# Patient Record
Sex: Male | Born: 1965 | Race: White | Hispanic: No | Marital: Single | Smoking: Former smoker
Health system: Southern US, Community
[De-identification: ages and names within clinical notes are randomized; demographics above are authoritative.]

## PROBLEM LIST (undated history)

## (undated) DIAGNOSIS — E119 Type 2 diabetes mellitus without complications: Secondary | ICD-10-CM

## (undated) DIAGNOSIS — E785 Hyperlipidemia, unspecified: Secondary | ICD-10-CM

## (undated) DIAGNOSIS — I10 Essential (primary) hypertension: Secondary | ICD-10-CM

## (undated) DIAGNOSIS — E669 Obesity, unspecified: Secondary | ICD-10-CM

## (undated) HISTORY — DX: Type 2 diabetes mellitus without complications: E11.9

## (undated) HISTORY — PX: VASECTOMY: SHX75

## (undated) HISTORY — PX: HAND SURGERY: SHX662

## (undated) HISTORY — DX: Essential (primary) hypertension: I10

---

## 2003-06-08 ENCOUNTER — Emergency Department (HOSPITAL_COMMUNITY): Admission: EM | Admit: 2003-06-08 | Discharge: 2003-06-08 | Payer: Self-pay | Admitting: Emergency Medicine

## 2003-06-08 IMAGING — CT CT HEAD W/O CM
1 series · 16 of 28 positions shown, 20 images · non-contrast
Comparison: none

CLINICAL DATA: Syncope.
 HEAD CT WITHOUT CONTRAST
 5 mm scans are made through the whole head.  The brain has a normal appearance without evidence of atrophy, stroke, mass, hemorrhage, hydrocephalus, or extraaxial collection.  The calvarium is unremarkable.   The paranasal sinuses, middle ears, and mastoids are clear. 
 IMPRESSION
 Normal head CT.

[Series 9173: — · axial · 0.46mm/px · z∈[-644,-519]mm · 16 of 28 slices shown, 20 images]
[im 2/28  brain]
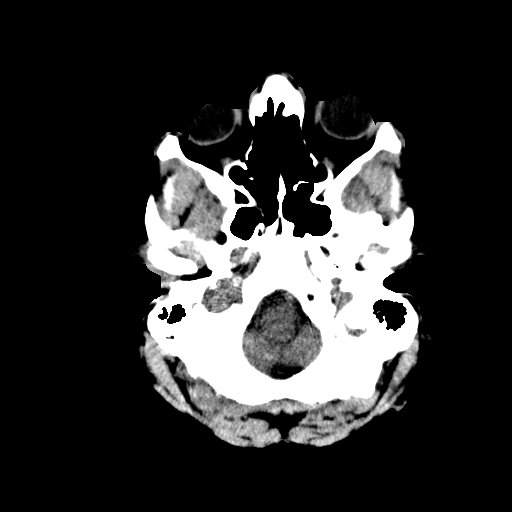
[im 2/28  bone]
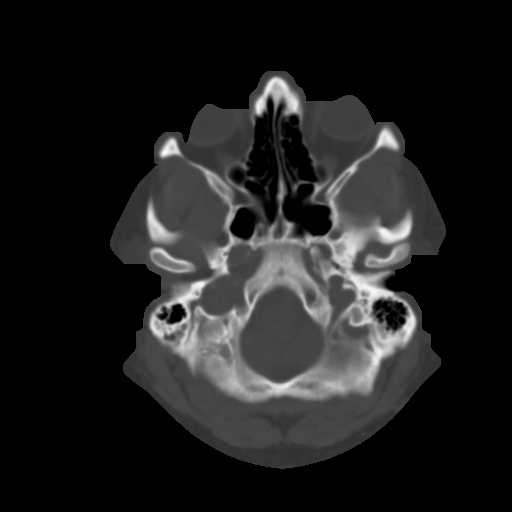
[im 4/28  brain]
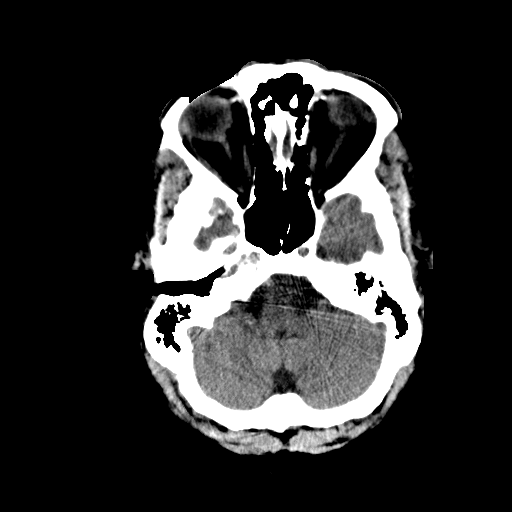
[im 6/28  brain]
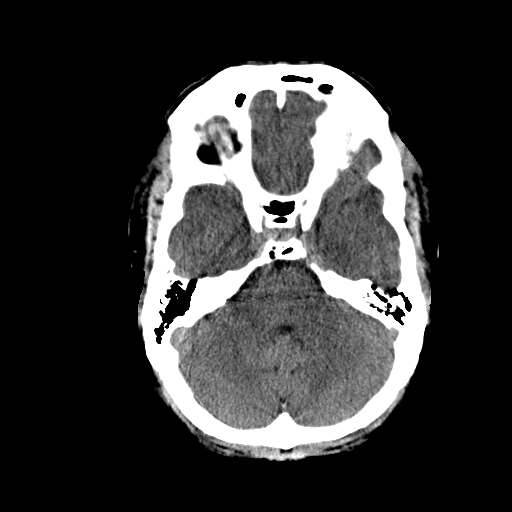
[im 7/28  brain]
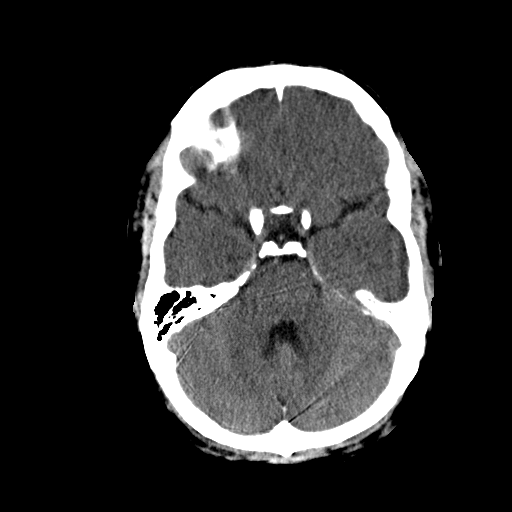
[im 9/28  brain]
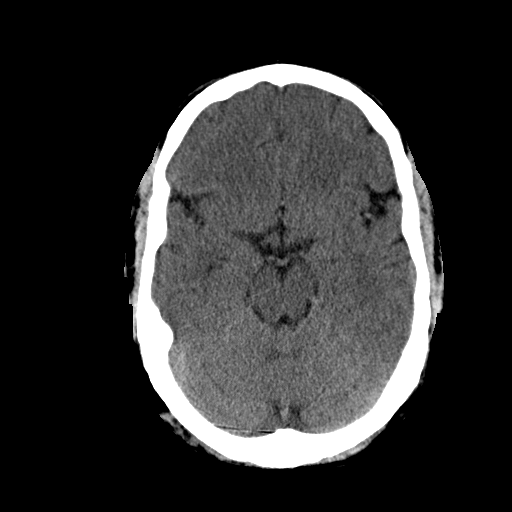
[im 9/28  bone]
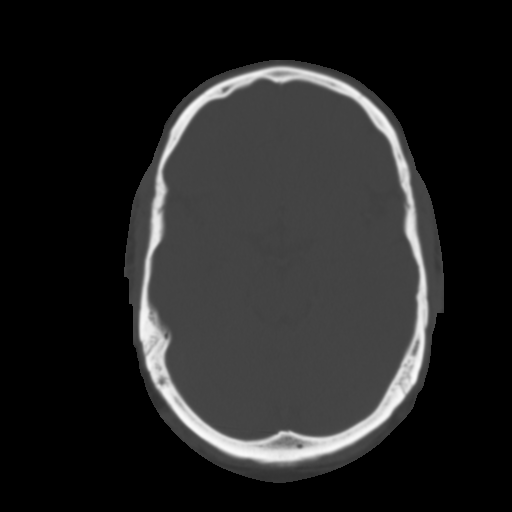
[im 10/28  brain]
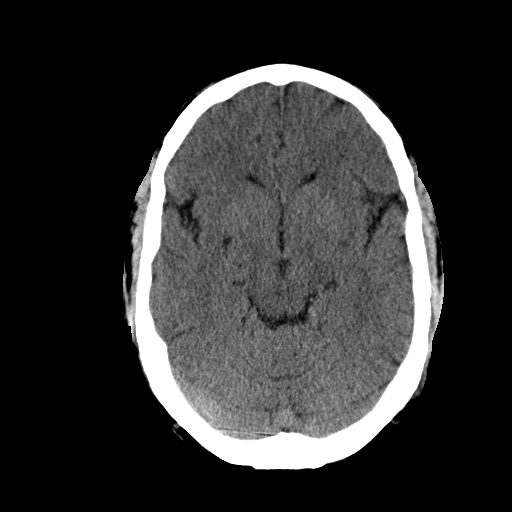
[im 12/28  brain]
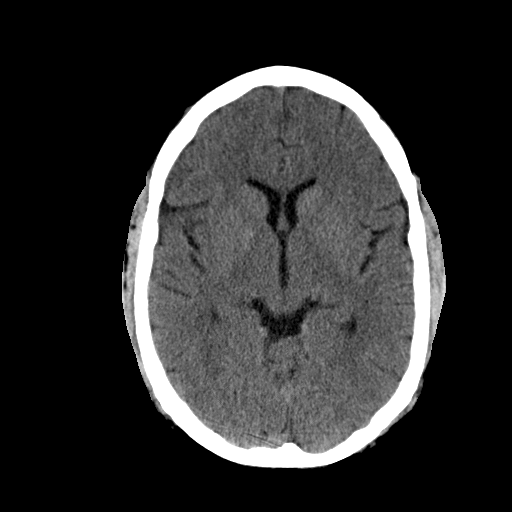
[im 14/28  brain]
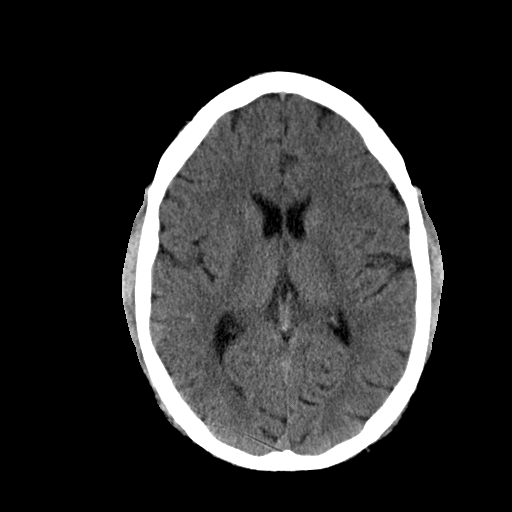
[im 15/28  brain]
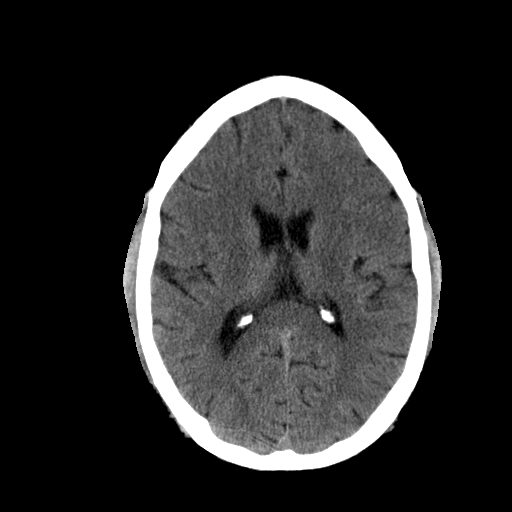
[im 15/28  bone]
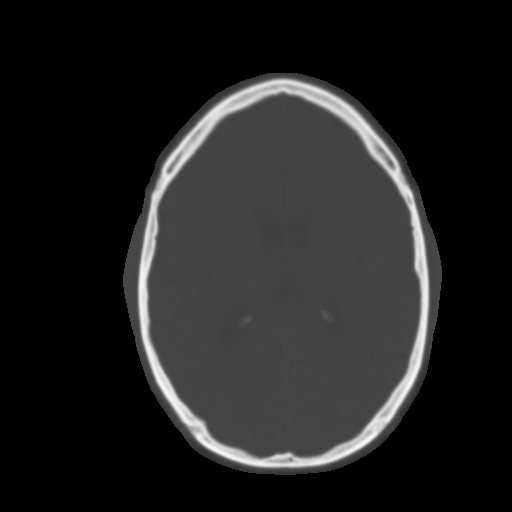
[im 17/28  brain]
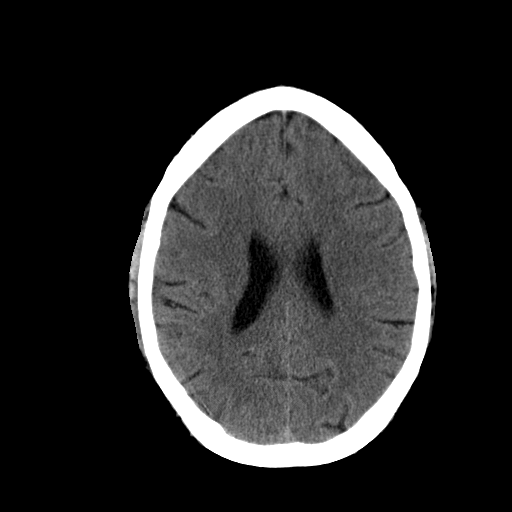
[im 19/28  brain]
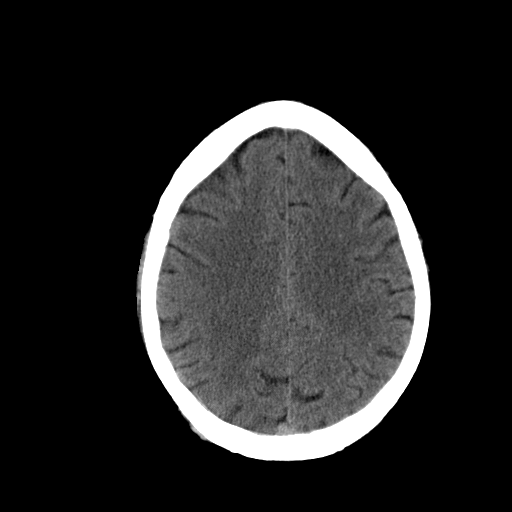
[im 20/28  brain]
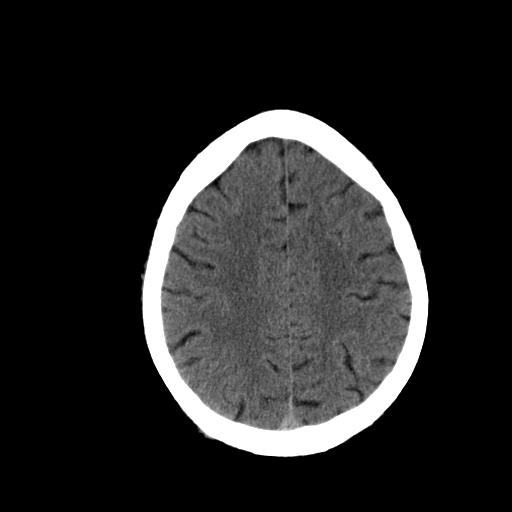
[im 22/28  brain]
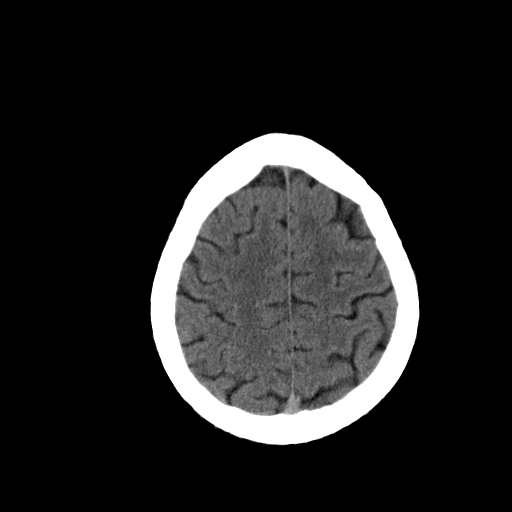
[im 22/28  bone]
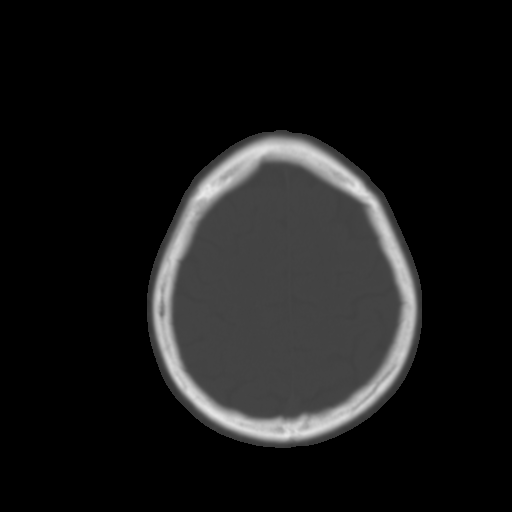
[im 23/28  brain]
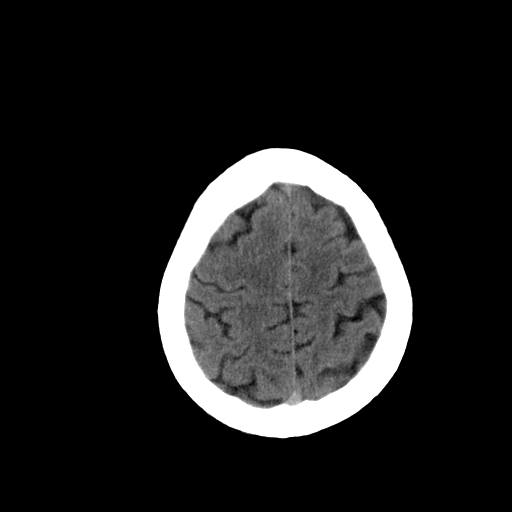
[im 25/28  brain]
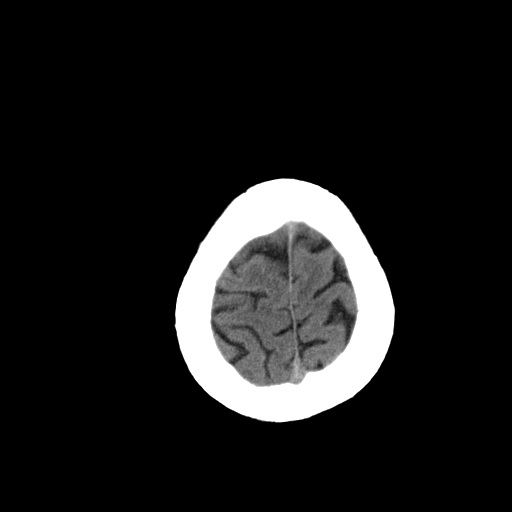
[im 27/28  brain]
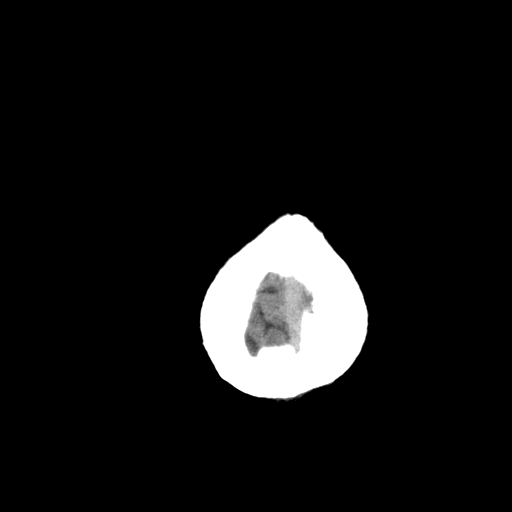

[16 of 28 positions shown; findings below may reference images not displayed]

## 2005-01-28 ENCOUNTER — Emergency Department (HOSPITAL_COMMUNITY): Admission: EM | Admit: 2005-01-28 | Discharge: 2005-01-29 | Payer: Self-pay | Admitting: Emergency Medicine

## 2005-01-30 ENCOUNTER — Inpatient Hospital Stay (HOSPITAL_COMMUNITY): Admission: EM | Admit: 2005-01-30 | Discharge: 2005-02-01 | Payer: Self-pay | Admitting: Emergency Medicine

## 2005-01-30 IMAGING — CR DG HAND COMPLETE 3+V*R*
3 series · 3 of 3 positions shown · non-contrast
Comparison: None.

CLINICAL DATA: Post I&D of hand wound ? now pain and swelling in hand.    
 RIGHT HAND - 3 VIEW:

[view not recorded (1 of 3)]
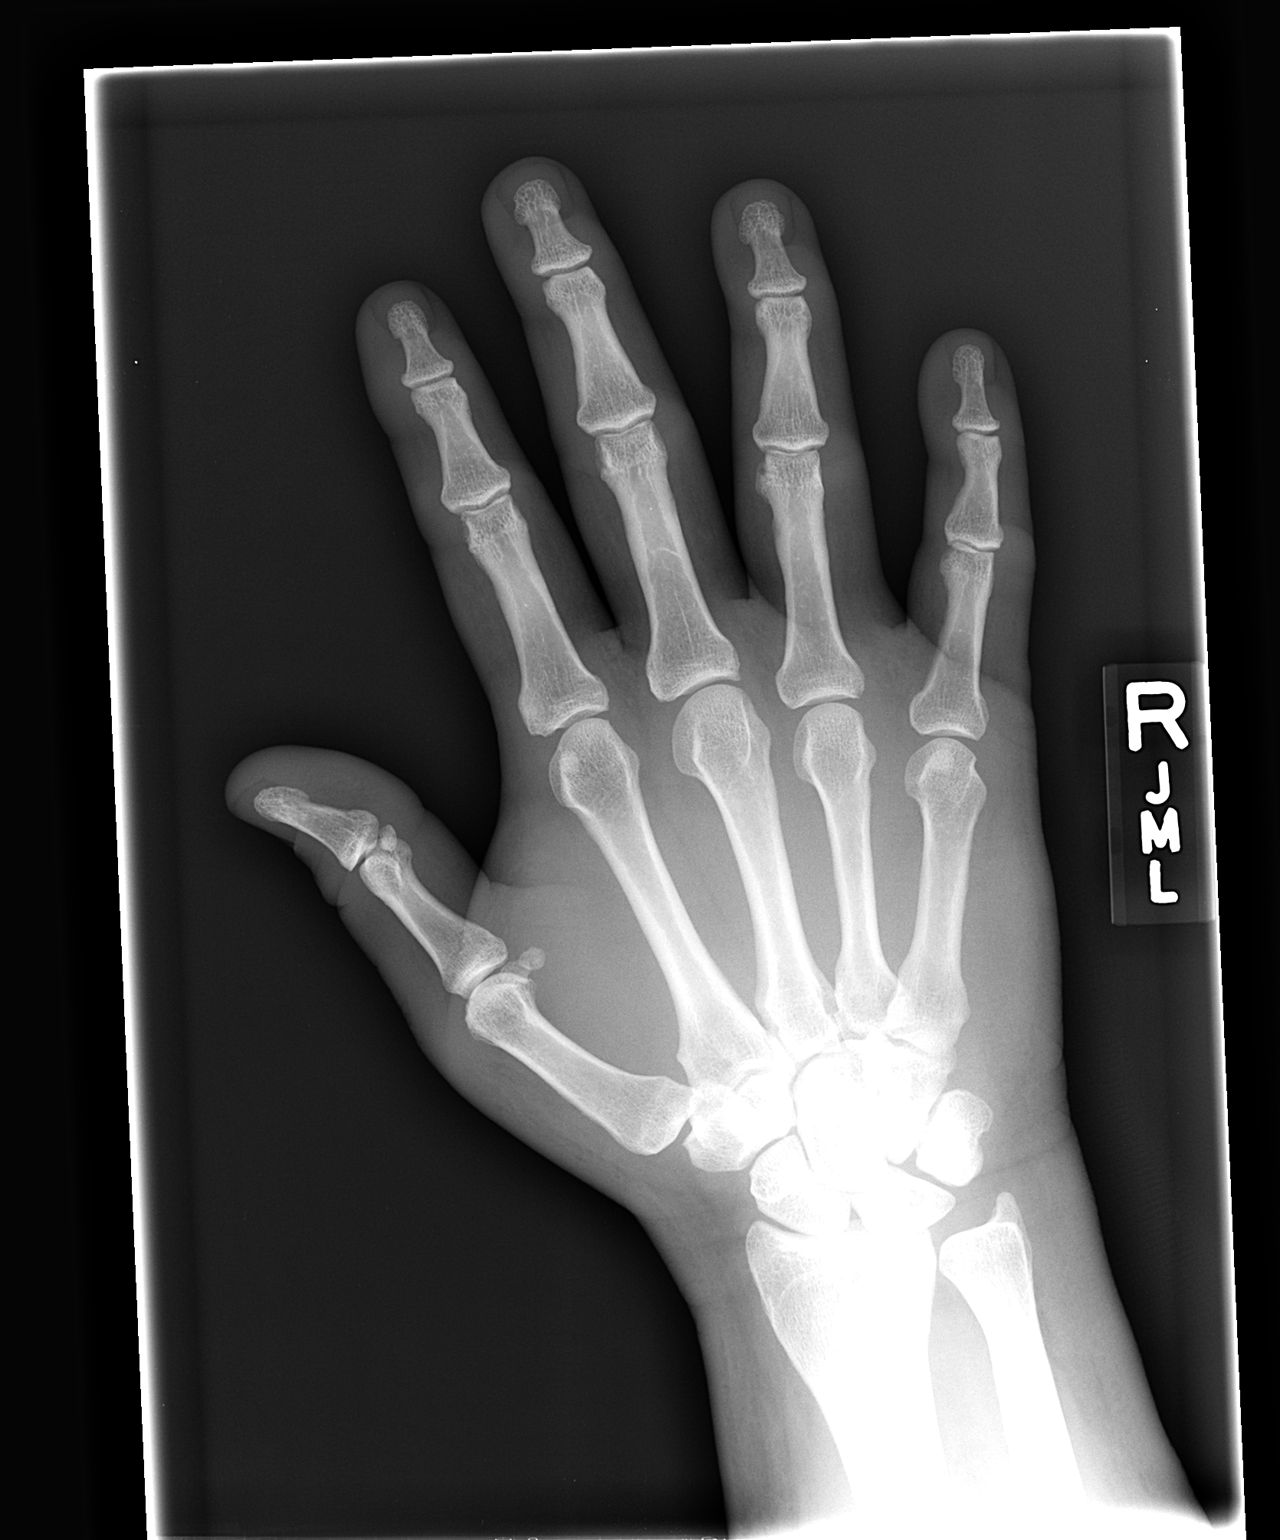

[view not recorded (2 of 3)]
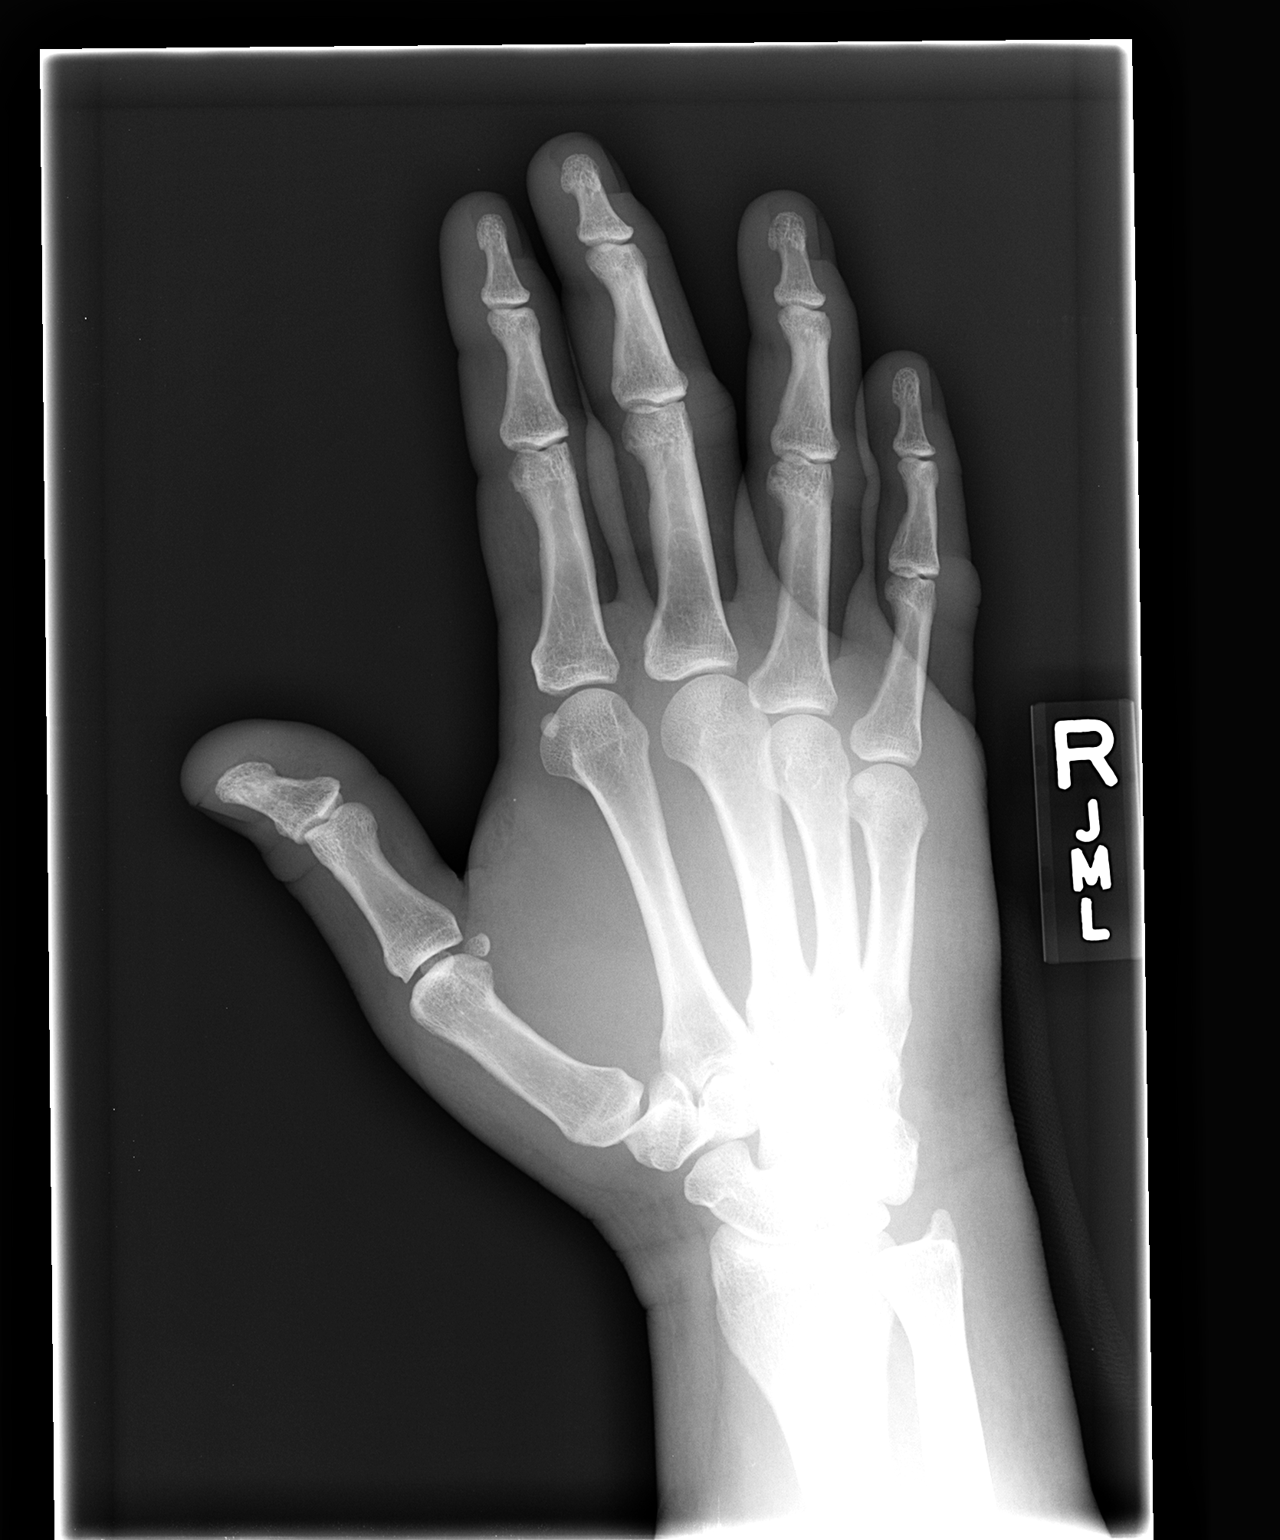

[view not recorded (3 of 3)]
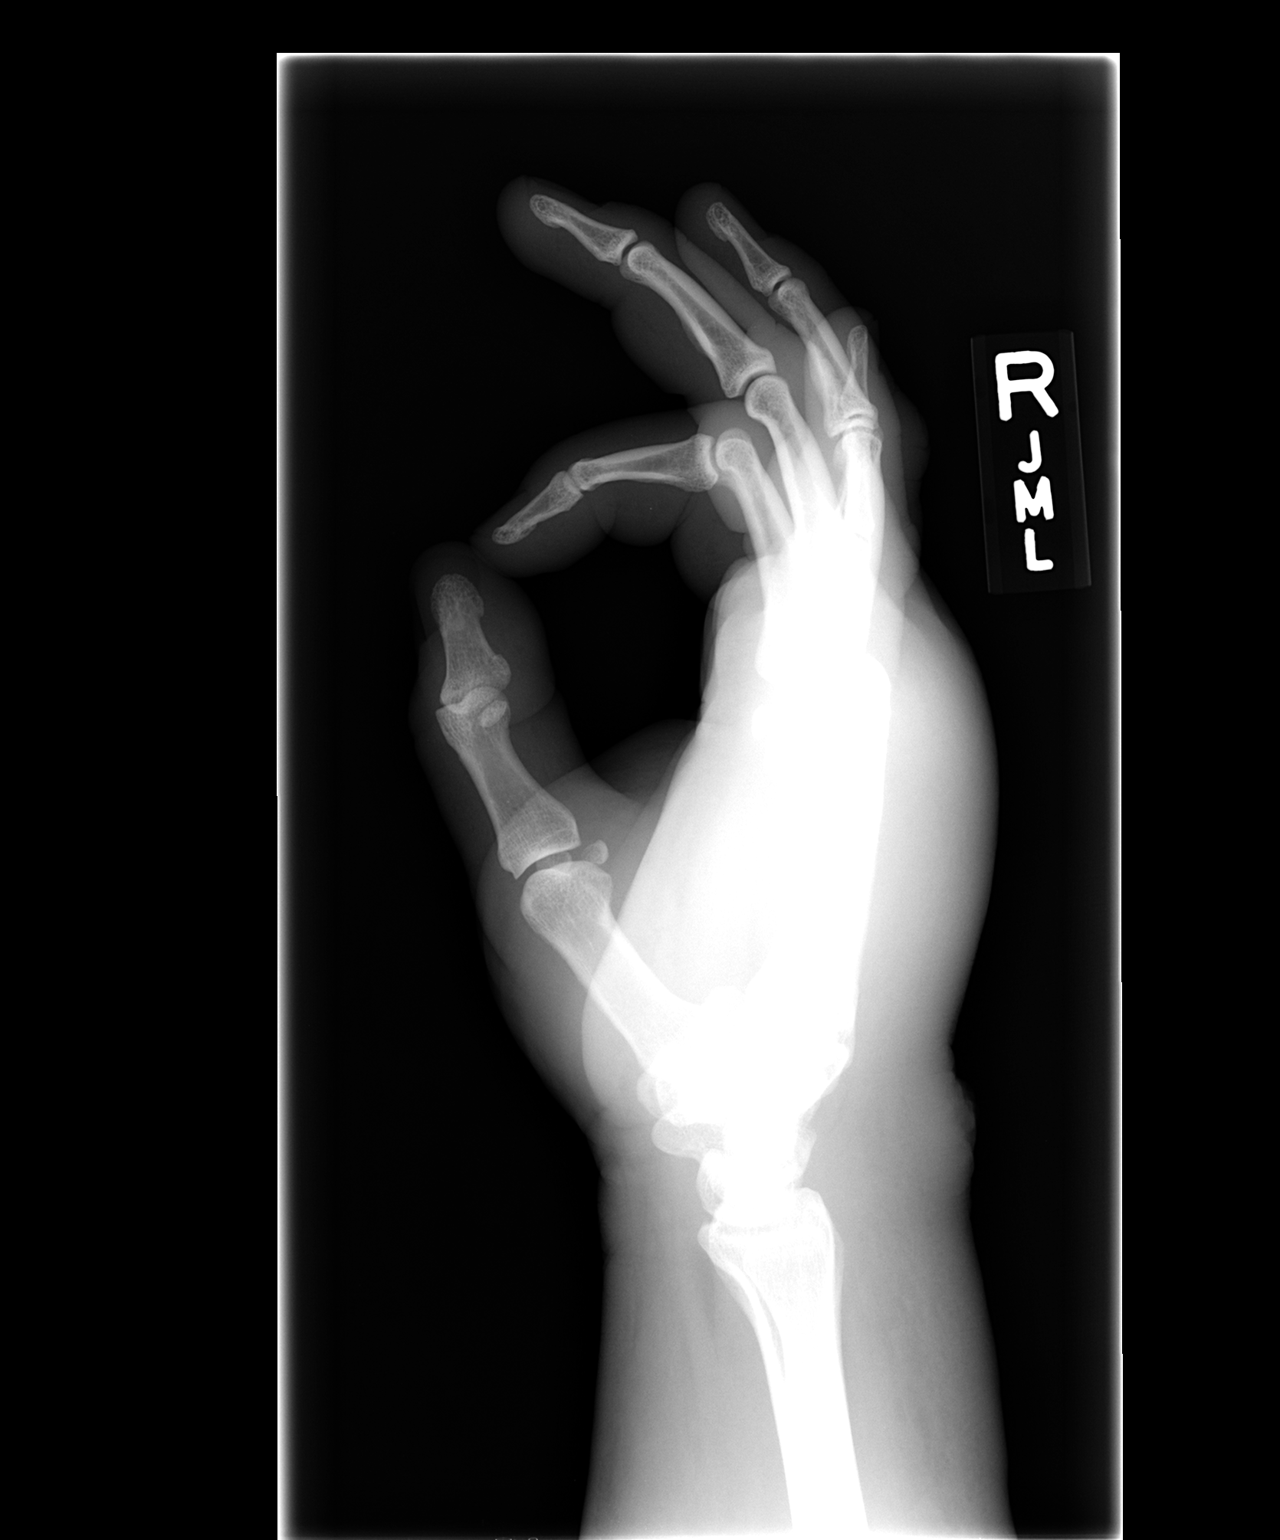

[3 of 3 positions shown; findings below may reference images not displayed]

FINDINGS: Marked soft tissue swelling without gas or foreign body in the soft tissues.  Bony structures normal.
IMPRESSION: Soft tissue swelling - otherwise normal.

## 2005-02-01 IMAGING — CR DG CHEST 1V PORT
1 series · 1 of 1 positions shown · non-contrast
Comparison: none

CLINICAL DATA: cellulitis abscess of the hand; PICC line placement
 PORTABLE CHEST- 1 VIEW:

[view not recorded]
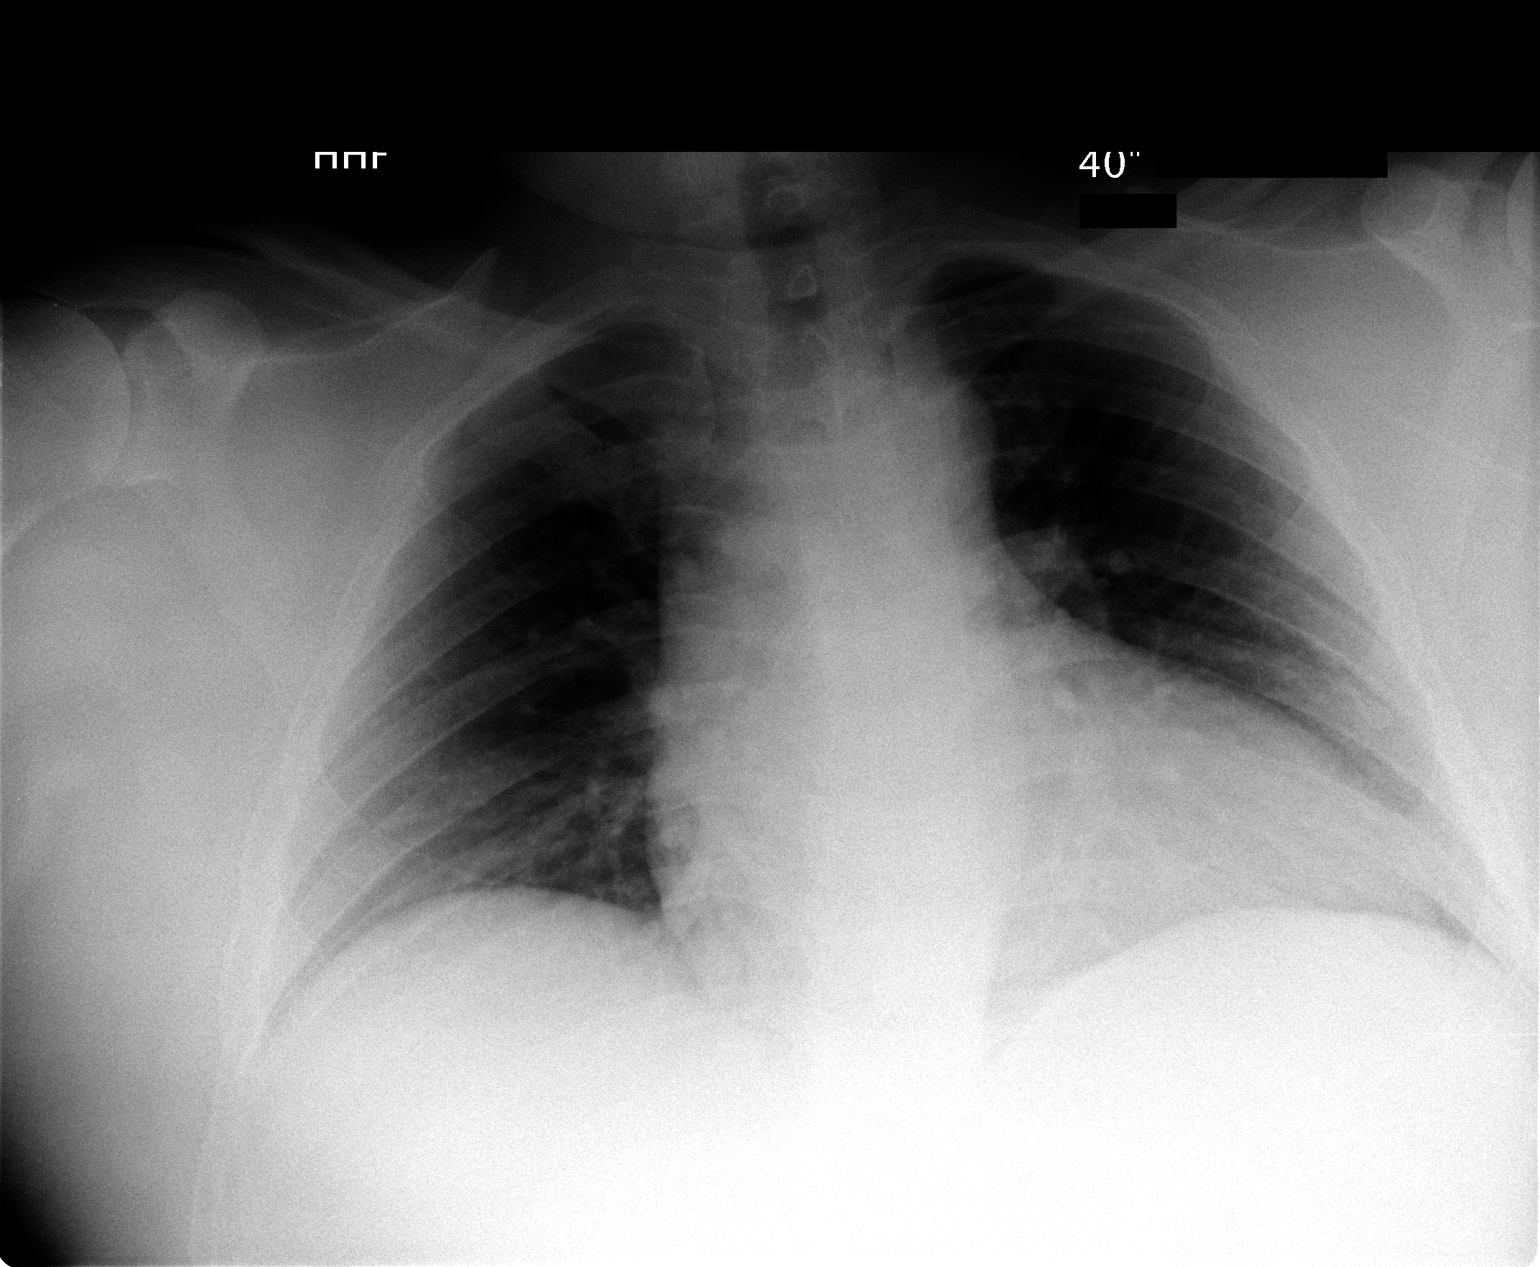

[1 of 1 positions shown; findings below may reference images not displayed]

FINDINGS: An AP semierect portable film of the chest without previous films for comparison shows a PICC line to have been inserted from the left arm.  The tip of that line lies in the superior vena cava and estimated 7cm above the right atrium.  There is no pneumothorax.  There is some hypoaeration of both bases.  The heart is within the normal limit considering the patient?s body habitus.
IMPRESSION: PICC line tip in superior vena cava estimated 7cm above the right atrium.  No pneumothorax.

## 2011-02-19 ENCOUNTER — Ambulatory Visit (INDEPENDENT_AMBULATORY_CARE_PROVIDER_SITE_OTHER): Payer: 59

## 2011-02-19 DIAGNOSIS — R509 Fever, unspecified: Secondary | ICD-10-CM

## 2011-02-19 DIAGNOSIS — R05 Cough: Secondary | ICD-10-CM

## 2011-03-17 ENCOUNTER — Ambulatory Visit (INDEPENDENT_AMBULATORY_CARE_PROVIDER_SITE_OTHER): Payer: 59

## 2011-03-17 DIAGNOSIS — J189 Pneumonia, unspecified organism: Secondary | ICD-10-CM

## 2011-03-17 DIAGNOSIS — R509 Fever, unspecified: Secondary | ICD-10-CM

## 2011-06-11 ENCOUNTER — Ambulatory Visit (INDEPENDENT_AMBULATORY_CARE_PROVIDER_SITE_OTHER): Payer: 59 | Admitting: Internal Medicine

## 2011-06-11 VITALS — BP 138/84 | HR 71 | Temp 97.8°F | Resp 18 | Ht 70.0 in | Wt 304.0 lb

## 2011-06-11 DIAGNOSIS — R110 Nausea: Secondary | ICD-10-CM

## 2011-06-11 DIAGNOSIS — R197 Diarrhea, unspecified: Secondary | ICD-10-CM

## 2011-06-11 MED ORDER — BELLADONNA ALK-PHENOBARBITAL 48 MG PO TBCR: 1.0000 | EXTENDED_RELEASE_TABLET | Freq: Four times a day (QID) | ORAL | Status: DC | PRN

## 2011-06-11 NOTE — Progress Notes (Signed)
  Subjective:    Patient ID: Carl [name], male    DOB: 1966-02-09, 46 y.o.   MRN: [id number]  HPI    Review of Systems     Objective:   Physical Exam        Assessment & Plan:

## 2011-06-12 ENCOUNTER — Encounter: Payer: Self-pay | Admitting: Internal Medicine

## 2011-06-12 DIAGNOSIS — E785 Hyperlipidemia, unspecified: Secondary | ICD-10-CM | POA: Insufficient documentation

## 2011-06-12 DIAGNOSIS — E119 Type 2 diabetes mellitus without complications: Secondary | ICD-10-CM | POA: Insufficient documentation

## 2011-06-12 DIAGNOSIS — Z6841 Body Mass Index (BMI) 40.0 and over, adult: Secondary | ICD-10-CM | POA: Insufficient documentation

## 2011-06-12 DIAGNOSIS — I10 Essential (primary) hypertension: Secondary | ICD-10-CM | POA: Insufficient documentation

## 2011-06-12 NOTE — Progress Notes (Signed)
  Subjective:    Patient ID: Carl [name], male    DOB: [birth year], 46 y.o.   MRN: [id number]  HPIPresents with one to 3 episodes of diarrhea a day for the past 48-hour period. He has also had nausea but no vomiting. There is no fever and no blood in the diarrhea. He has mild cramping before the diarrhea. He has missed work Thursday due to feeling bad. He has a general history of reactive loose stools after eating or when upset for many years. His diabetes is stable    Review of Systems     Objective:   Physical Exam  Vital signs stable except for a BMI of 44 Abdomen is soft nontender nondistended with no increased bowel sounds and no organomegaly      Assessment & Plan:  Problem #1 nausea and diarrhea probably viral in origin  Other problems as listed Plan  Probiotics Donatal a.c. And at bedtime for 5 days Followup if not well

## 2011-10-06 ENCOUNTER — Ambulatory Visit (INDEPENDENT_AMBULATORY_CARE_PROVIDER_SITE_OTHER): Payer: 59 | Admitting: Family Medicine

## 2011-10-06 VITALS — BP 138/74 | HR 72 | Temp 98.3°F | Resp 17 | Ht 69.0 in | Wt 309.0 lb

## 2011-10-06 DIAGNOSIS — R350 Frequency of micturition: Secondary | ICD-10-CM

## 2011-10-06 LAB — POCT URINALYSIS DIPSTICK
Bilirubin, UA: NEGATIVE
Glucose, UA: NEGATIVE
Ketones, UA: NEGATIVE
Leukocytes, UA: NEGATIVE
Nitrite, UA: NEGATIVE
Spec Grav, UA: >=1.03
Urobilinogen, UA: 0.2
pH, UA: 5.5

## 2011-10-06 LAB — POCT UA - MICROSCOPIC ONLY
Casts, Ur, LPF, POC: NEGATIVE
Crystals, Ur, HPF, POC: NEGATIVE
Yeast, UA: NEGATIVE

## 2011-10-06 MED ORDER — TAMSULOSIN HCL 0.4 MG PO CAPS: 0.4000 mg | ORAL_CAPSULE | Freq: Every day | ORAL | Status: DC

## 2011-10-06 MED ORDER — CIPROFLOXACIN HCL 500 MG PO TABS: 500.0000 mg | ORAL_TABLET | Freq: Two times a day (BID) | ORAL | Status: AC

## 2011-10-06 NOTE — Progress Notes (Signed)
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    His PMHx is positive for BPH  He tried taking a beer, otherwise no rx attempted.  Objective:  NAD, morbidly obese, plethoric Abdomen:  Nontender. Results for orders placed in visit on 10/06/11  POCT UA - MICROSCOPIC ONLY      Component Value Range   WBC, Ur, HPF, POC 5-8     RBC, urine, microscopic 2-4     Bacteria, U Microscopic trace     Mucus, UA small     Epithelial cells, urine per micros 2-3     Crystals, Ur, HPF, POC neg     Casts, Ur, LPF, POC neg     Yeast, UA neg    POCT URINALYSIS DIPSTICK      Component Value Range   Color, UA yellow     Clarity, UA hazy     Glucose, UA neg     Bilirubin, UA neg     Ketones, UA neg     Spec Grav, UA >=1.030     Blood, UA trace     pH, UA 5.5     Protein, UA trace     Urobilinogen, UA 0.2     Nitrite, UA neg     Leukocytes, UA Negative       Assessment:  Suspect UTI, but this could also be overflow incontinence secondary to BPH  Plan: Urine culture 1. Urinary frequency  POCT UA - Microscopic Only, POCT urinalysis dipstick, Urine culture, Tamsulosin HCl (FLOMAX) 0.4 MG CAPS, ciprofloxacin (CIPRO) 500 MG tablet

## 2011-10-08 LAB — URINE CULTURE
Colony Count: NO GROWTH
Organism ID, Bacteria: NO GROWTH

## 2011-11-24 ENCOUNTER — Ambulatory Visit: Payer: 59

## 2011-11-24 ENCOUNTER — Ambulatory Visit (INDEPENDENT_AMBULATORY_CARE_PROVIDER_SITE_OTHER): Payer: 59 | Admitting: Internal Medicine

## 2011-11-24 VITALS — BP 130/78 | HR 84 | Temp 98.3°F | Resp 22 | Ht 69.5 in | Wt 308.2 lb

## 2011-11-24 DIAGNOSIS — S40019A Contusion of unspecified shoulder, initial encounter: Secondary | ICD-10-CM

## 2011-11-24 DIAGNOSIS — E119 Type 2 diabetes mellitus without complications: Secondary | ICD-10-CM

## 2011-11-24 DIAGNOSIS — M25519 Pain in unspecified shoulder: Secondary | ICD-10-CM

## 2011-11-24 DIAGNOSIS — S40029A Contusion of unspecified upper arm, initial encounter: Secondary | ICD-10-CM

## 2011-11-24 DIAGNOSIS — Z5181 Encounter for therapeutic drug level monitoring: Secondary | ICD-10-CM

## 2011-11-24 DIAGNOSIS — I10 Essential (primary) hypertension: Secondary | ICD-10-CM

## 2011-11-24 DIAGNOSIS — M25511 Pain in right shoulder: Secondary | ICD-10-CM

## 2011-11-24 LAB — POCT CBC
Granulocyte percent: 68.8 %G (ref 37–80)
HCT, POC: 47.5 % (ref 43.5–53.7)
Hemoglobin: 15.3 g/dL (ref 14.1–18.1)
Lymph, poc: 2.4 (ref 0.6–3.4)
MCH, POC: 28.9 pg (ref 27–31.2)
MCHC: 32.2 g/dL (ref 31.8–35.4)
MCV: 89.7 fL (ref 80–97)
MID (cbc): 0.6 (ref 0–0.9)
MPV: 10.8 fL (ref 0–99.8)
POC Granulocyte: 6.6 (ref 2–6.9)
POC LYMPH PERCENT: 25 %L (ref 10–50)
POC MID %: 6.2 %M (ref 0–12)
Platelet Count, POC: 261 10*3/uL (ref 142–424)
RBC: 5.3 M/uL (ref 4.69–6.13)
RDW, POC: 14.9 %
WBC: 9.6 10*3/uL (ref 4.6–10.2)

## 2011-11-24 LAB — POCT GLYCOSYLATED HEMOGLOBIN (HGB A1C): Hemoglobin A1C: 8.3

## 2011-11-24 LAB — GLUCOSE, POCT (MANUAL RESULT ENTRY): POC Glucose: 150 mg/dl — AB (ref 70–99)

## 2011-11-24 IMAGING — CR DG SHOULDER 2+V*R*
3 series · 3 of 3 positions shown · non-contrast
Comparison: None.

CLINICAL DATA: Fell from a ladder and injured the right shoulder.

RIGHT SHOULDER - 2+ VIEW

[ap int rot]
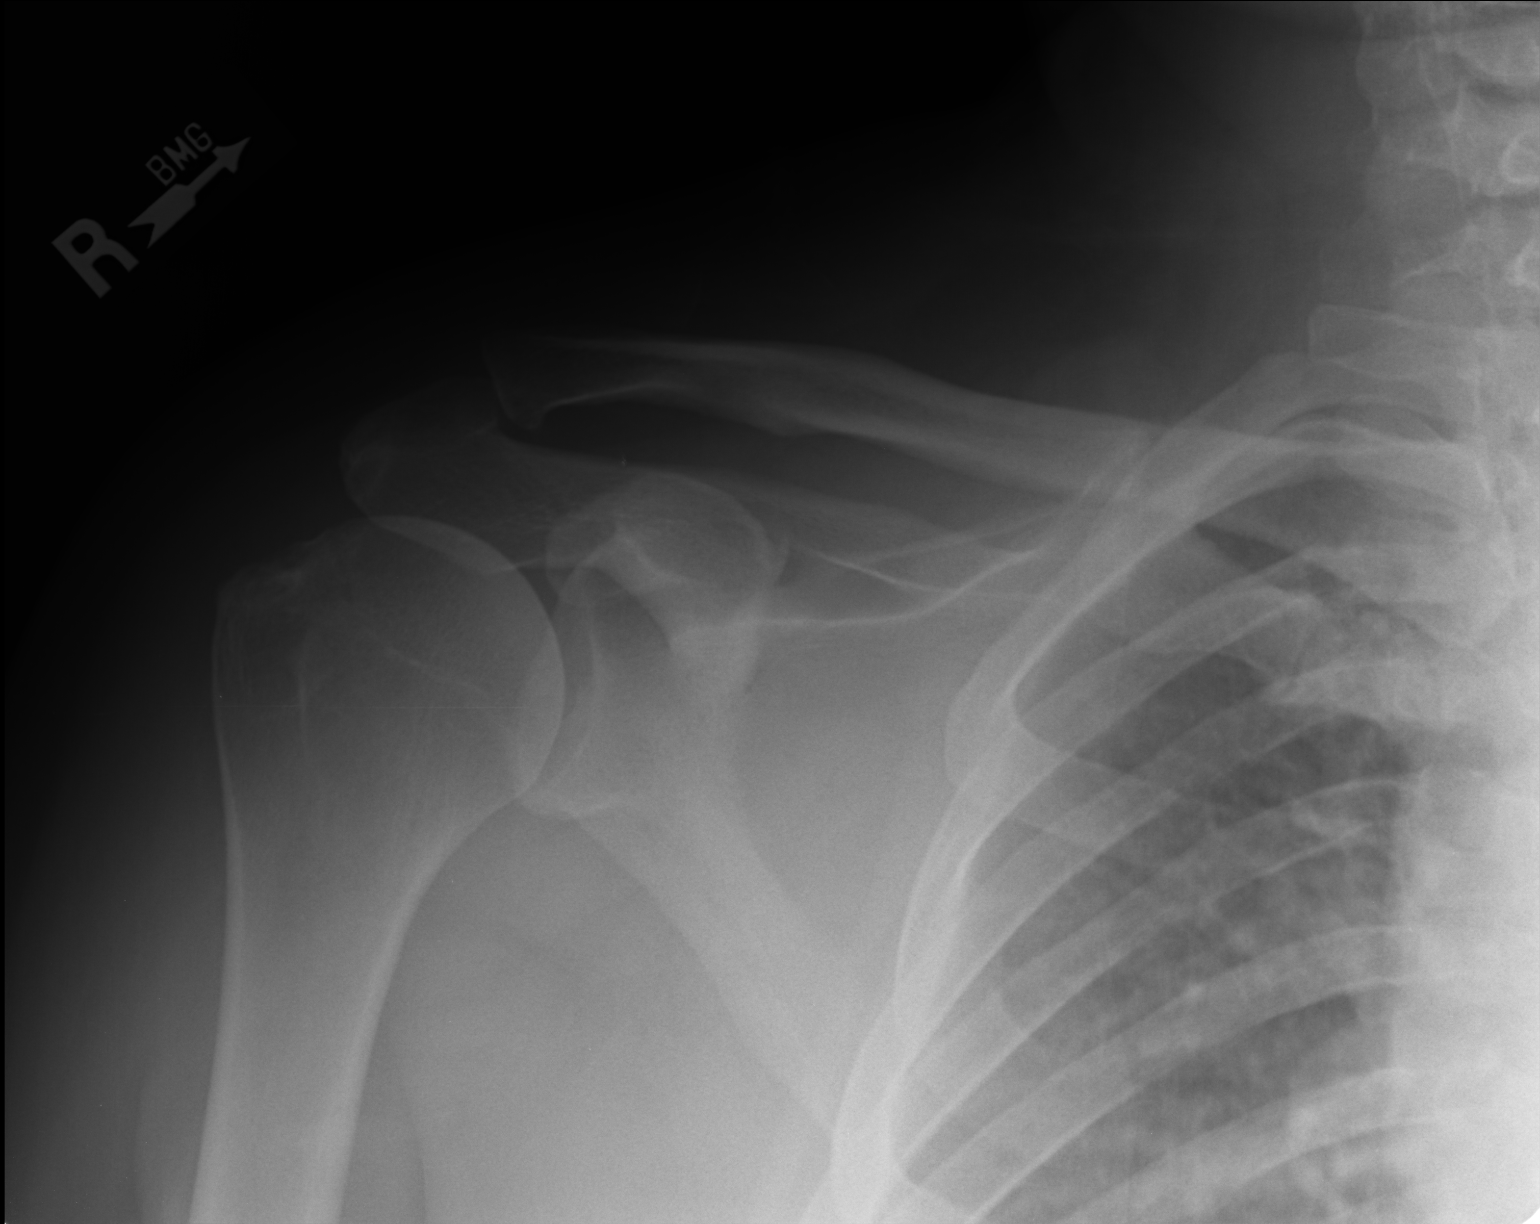

[ap ext rot]
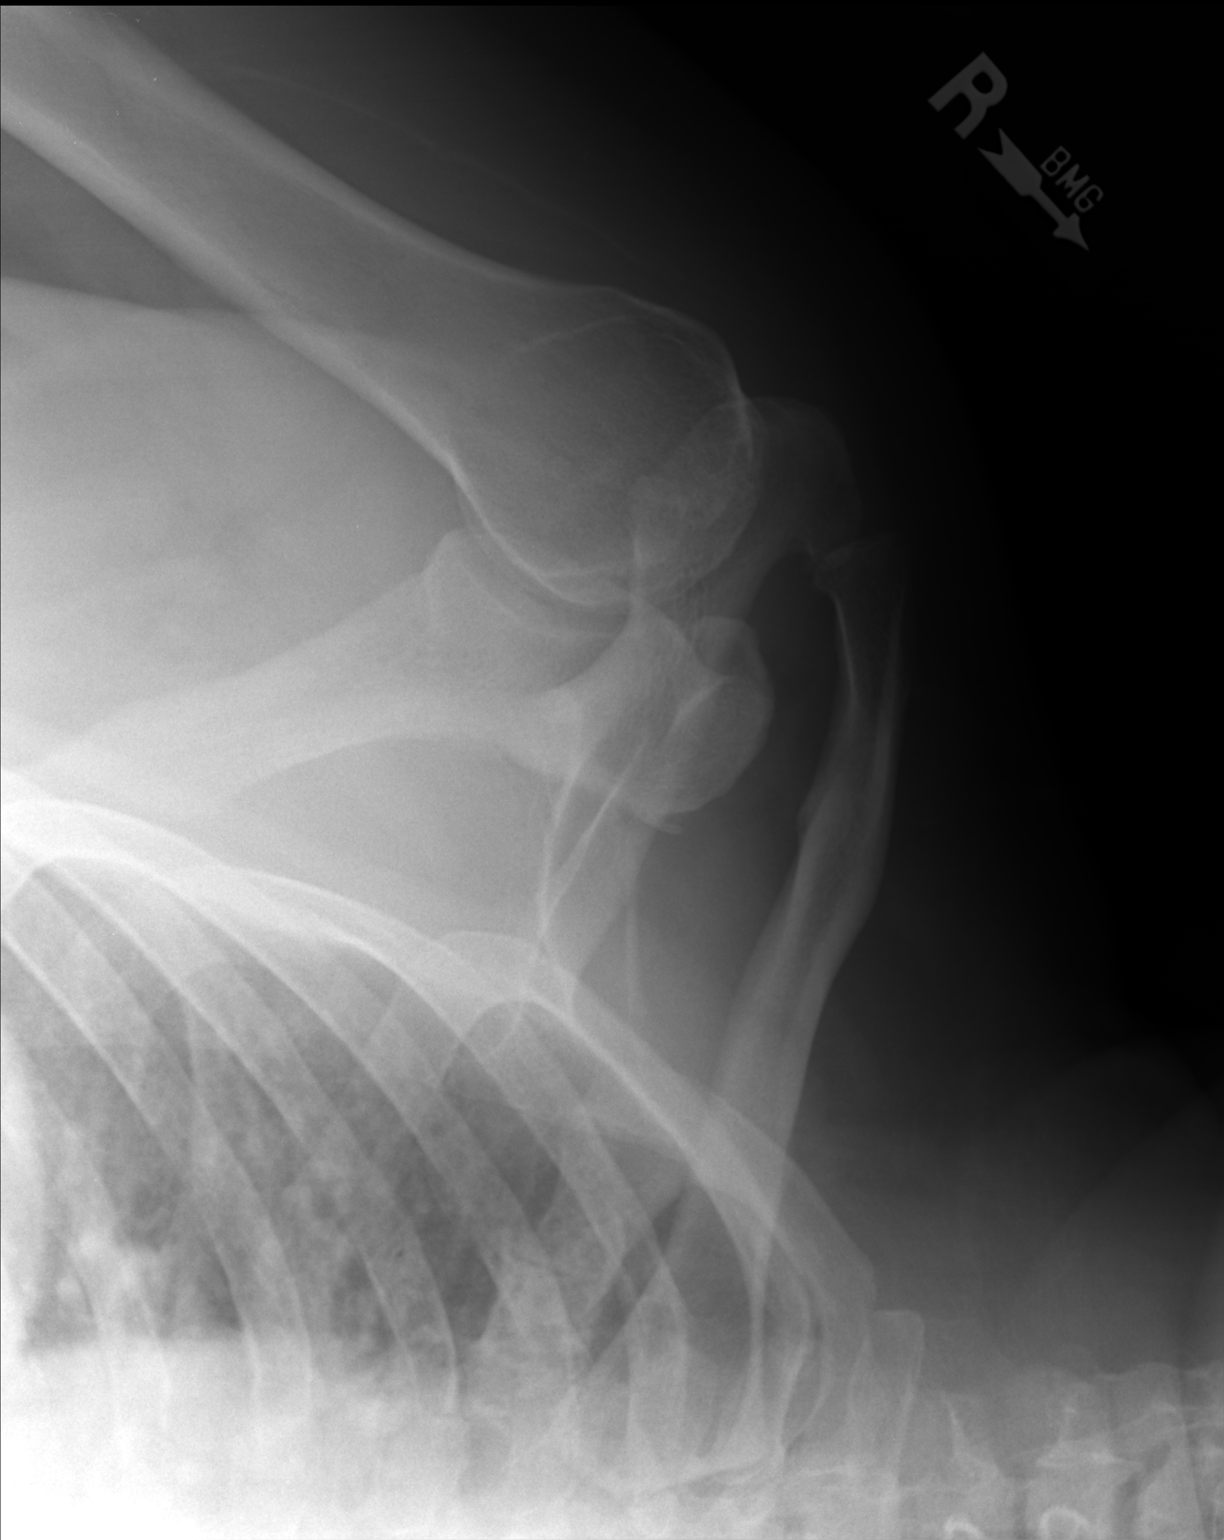

[AP]
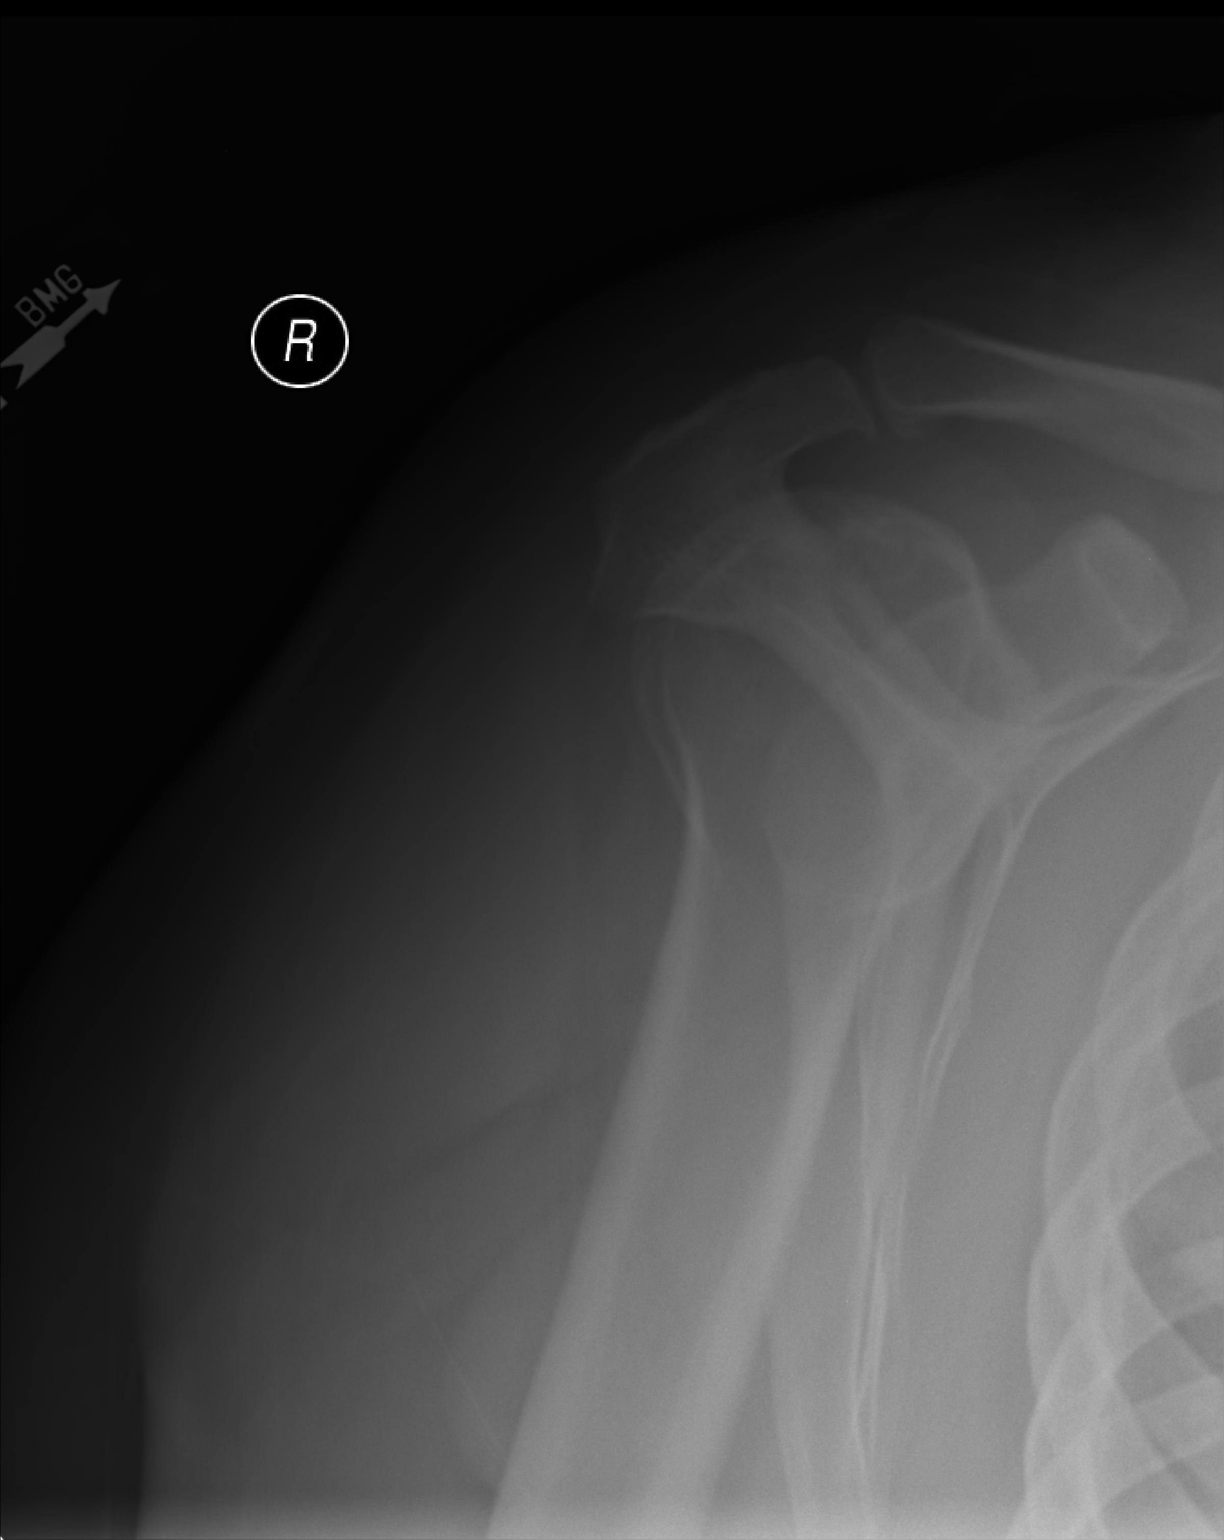

[3 of 3 positions shown; findings below may reference images not displayed]

FINDINGS: No evidence of acute fracture or glenohumeral
dislocation.  Spurring at the insertion spurring at the insertion
of the coracoacromial ligament on the acromion.  Acromioclavicular
joint intact with degenerative changes.
IMPRESSION: No acute osseous abnormality.  Degenerative changes in the AC
joint.

Clinically significant discrepancy from primary report, if
provided: No fracture identified.

## 2011-11-24 MED ORDER — LISINOPRIL 10 MG PO TABS: 10.0000 mg | ORAL_TABLET | Freq: Every day | ORAL | Status: DC

## 2011-11-24 MED ORDER — HYDROCODONE-ACETAMINOPHEN 5-500 MG PO TABS: 1.0000 | ORAL_TABLET | Freq: Three times a day (TID) | ORAL | Status: AC | PRN

## 2011-11-24 MED ORDER — PRAVASTATIN SODIUM 40 MG PO TABS: 40.0000 mg | ORAL_TABLET | Freq: Every day | ORAL | Status: DC

## 2011-11-24 MED ORDER — METFORMIN HCL 500 MG PO TABS: 500.0000 mg | ORAL_TABLET | Freq: Two times a day (BID) | ORAL | Status: DC

## 2011-11-24 NOTE — Progress Notes (Signed)
  Subjective:    Patient ID: Carl [name], male    DOB: [birth year], 46 y.o.   MRN: [id number]  HPI  46 year old man is here with complaints of right shoulder and upper back.  Larey Seat off a ladder on Tuesday.  The ladder slipped out from here.  Landed on shoulder and rolled on back.  It felt ok on Tuesday then started getting sore on Wednesday and by Thursday he couldn't move good.      Review of Systems     Objective:   Physical Exam        Assessment & Plan:

## 2011-11-24 NOTE — Progress Notes (Signed)
  Subjective:    Patient ID: Carl [name], male    DOB: Jul 01, 1965, 46 y.o.   MRN: [id number]  HPI Larey Seat off ladder 2 -3d ago, pain in shoulder persists. Has near full rom with pain Needs dm, htn and lipids checked and needs rfs   Review of Systems     Objective:   Physical Exam Right shoulder full rom, pain with lateral abduction NMS intact  [facility] reading (PRIMARY) by  DrGuest. Possible lucency humerol head, ? fx  Results for orders placed in visit on 11/24/11  POCT CBC      Component Value Range   WBC 9.6  4.6 - 10.2 K/uL   Lymph, poc 2.4  0.6 - 3.4   POC LYMPH PERCENT 25.0  10 - 50 %L   MID (cbc) 0.6  0 - 0.9   POC MID % 6.2  0 - 12 %M   POC Granulocyte 6.6  2 - 6.9   Granulocyte percent 68.8  37 - 80 %G   RBC 5.30  4.69 - 6.13 M/uL   Hemoglobin 15.3  14.1 - 18.1 g/dL   HCT, POC 40.9  81.1 - 53.7 %   MCV 89.7  80 - 97 fL   MCH, POC 28.9  27 - 31.2 pg   MCHC 32.2  31.8 - 35.4 g/dL   RDW, POC 91.4     Platelet Count, POC 261  142 - 424 K/uL   MPV 10.8  0 - 99.8 fL  GLUCOSE, POCT (MANUAL RESULT ENTRY)      Component Value Range   POC Glucose 150 (*) 70 - 99 mg/dl  POCT GLYCOSYLATED HEMOGLOBIN (HGB A1C)      Component Value Range   Hemoglobin A1C 8.3              Assessment & Plan:  RICE/ sling right shoulder RF all meds/NIDDM losing control F/up dm3 mos. F/up shoulder 4 days

## 2011-11-24 NOTE — Patient Instructions (Addendum)
Rotator Cuff Tear The rotator cuff is four tendons that assist in the motion of the shoulder. A rotator cuff tear is a tear in one of these four tendons. It is characterized by pain and weakness of the shoulder. The rotator cuff tendons surround the shoulder ball and socket joint (humeral head). The rotator cuff tendons attach to the shoulder blade (scapula) on one side and the upper arm bone (humerus) on the other side. The rotator cuff is essential for shoulder stability and shoulder motion. SYMPTOMS    Pain around the shoulder, often at the outer portion of the upper arm.   Pain that is worse with shoulder function, especially when reaching overhead or lifting.   Weakness of the shoulder muscles.   Aching when not using your arm; often, pain awakens you at night, especially when sleeping on the affected side.   Tenderness, swelling, warmth, or redness over the outer aspect of the shoulder.   Loss of strength.   Limited motion of the shoulder, especially reaching behind (reaching into one's back pocket) or across your body.   A crackling sound (crepitation) when moving the shoulder.   Biceps tendon pain (in the front of the shoulder) and inflammation, worse with bending the elbow or lifting.  CAUSES    Strain from sudden increase in amount or intensity of activity.   Direct blow or injury to the shoulder.   Aging, wear from from normal use.   Roof of the shoulder (acromial) spur.  RISK INCREASES WITH:    Contact sports (football, wrestling, or boxing).   Throwing or hitting sports (baseball, tennis, or volleyball).   Weightlifting and bodybuilding.   Heavy labor.   Previous injury to rotator cuff.   Failure to warm up properly before activity.   Inadequate protective equipment.   Increasing age.   Spurring of the outer end of the scapula (acromion).   Cortisone injections.   Poor shoulder strength and flexibility.  PREVENTION  Warm up and stretch properly  before activity.   Allow time for rest and recovery between practices and competition.   Maintain physical fitness:   Cardiovascular fitness.   Shoulder flexibility.   Strength and endurance of the rotator cuff muscles and muscles of the shoulder blade.   Learn and use proper technique when throwing or hitting.  PROGNOSIS Surgery is often needed. Although, symptoms may go away by themselves. RELATED COMPLICATIONS    Persistent pain that may progress to constant pain.   Shoulder stiffness, frozen shoulder syndrome, or loss of motion.   Recurrence of symptoms, especially if treated without surgery.   Inability to return to same level of sports, even with surgery.   Persistent weakness.   Risks of surgery, including infection, bleeding, injury to nerves, shoulder stiffness, weakness, re-tearing of the rotator cuff tendon.   Deltoid detachment, acromial fracture, and persistent pain.  TREATMENT Treatment involves the use of ice and medicine to reduce pain and inflammation. Strengthening and stretching exercise are usually recommended. These exercises may be completed at home or with a therapist. You may also be instructed to modify offending activities. Corticosteroid injections may be given to reduce inflammation. Surgery is usually recommended for athletes. Surgery has the best chance for a full recovery. Surgery involves:  Removal of an inflamed bursa.   Removal of an acromial spur if present.   Suturing the torn tendon back together.  Rotator cuff surgeries may be preformed either arthroscopically or through an open incision. Recovery typically takes 6 to 12   months. MEDICATION  If pain medicine is necessary, then nonsteroidal anti-inflammatory medicines, such as aspirin and ibuprofen, or other minor pain relievers, such as acetaminophen, are often recommended.   Do not take pain medicine for 7 days before surgery.   Prescription pain relievers are usually only prescribed  after surgery. Use only as directed and only as much as you need.   Corticosteroid injections may be given to reduce inflammation. However, there is a limited number of times the joint may be injected with these medicines.  HEAT AND COLD  Cold treatment (icing) relieves pain and reduces inflammation. Cold treatment should be applied for 10 to 15 minutes every 2 to 3 hours for inflammation and pain and immediately after any activity that aggravates your symptoms. Use ice packs or massage the area with a piece of ice (ice massage).   Heat treatment may be used prior to performing the stretching and strengthening activities prescribed by your caregiver, physical therapist, or athletic trainer. Use a heat pack or soak the injury in warm water.  SEEK MEDICAL CARE IF:    Symptoms get worse or do not improve in 4 to 6 weeks despite treatment.   You experience pain, numbness, or coldness in the hand.   Blue, gray, or dark color appears in the fingernails.   New, unexplained symptoms develop (drugs used in treatment may produce side effects).  Document Released: 02/27/2005 Document Revised: 02/16/2011 Document Reviewed: 06/11/2008 [employer] Patient Information 2012 [employer]. 

## 2011-11-25 LAB — COMPREHENSIVE METABOLIC PANEL
ALT: 50 U/L (ref 0–53)
AST: 39 U/L — ABNORMAL HIGH (ref 0–37)
Albumin: 5 g/dL (ref 3.5–5.2)
Alkaline Phosphatase: 65 U/L (ref 39–117)
BUN: 14 mg/dL (ref 6–23)
CO2: 22 mEq/L (ref 19–32)
Calcium: 9.7 mg/dL (ref 8.4–10.5)
Chloride: 105 mEq/L (ref 96–112)
Creat: 0.68 mg/dL (ref 0.50–1.35)
Glucose, Bld: 157 mg/dL — ABNORMAL HIGH (ref 70–99)
Potassium: 3.9 mEq/L (ref 3.5–5.3)
Sodium: 139 mEq/L (ref 135–145)
Total Bilirubin: 0.7 mg/dL (ref 0.3–1.2)
Total Protein: 7.6 g/dL (ref 6.0–8.3)

## 2011-11-25 LAB — LIPID PANEL
Cholesterol: 224 mg/dL — ABNORMAL HIGH (ref 0–200)
HDL: 33 mg/dL — ABNORMAL LOW (ref >39)
LDL Cholesterol: 137 mg/dL — ABNORMAL HIGH (ref 0–99)
Total CHOL/HDL Ratio: 6.8 Ratio
Triglycerides: 268 mg/dL — ABNORMAL HIGH (ref <150)
VLDL: 54 mg/dL — ABNORMAL HIGH (ref 0–40)

## 2011-11-28 ENCOUNTER — Ambulatory Visit (INDEPENDENT_AMBULATORY_CARE_PROVIDER_SITE_OTHER): Payer: 59 | Admitting: Internal Medicine

## 2011-11-28 VITALS — BP 150/88 | HR 70 | Temp 98.0°F | Resp 18 | Ht 70.0 in | Wt 312.0 lb

## 2011-11-28 DIAGNOSIS — M25519 Pain in unspecified shoulder: Secondary | ICD-10-CM

## 2011-11-28 DIAGNOSIS — IMO0002 Reserved for concepts with insufficient information to code with codable children: Secondary | ICD-10-CM

## 2011-11-28 DIAGNOSIS — S40029A Contusion of unspecified upper arm, initial encounter: Secondary | ICD-10-CM

## 2011-11-28 DIAGNOSIS — S40019A Contusion of unspecified shoulder, initial encounter: Secondary | ICD-10-CM

## 2011-11-28 NOTE — Progress Notes (Signed)
  Subjective:    Patient ID: Carl [name], male    DOB: Nov 28, 1965, 46 y.o.   MRN: [id number]  HPI Shoulder improved a little. Still painfull and cannot work. Probable rotator cuff, xr no fx. NMV intact in right hand   Review of Systems Dm now taking meds    Objective:   Physical Exam Right shoulder arm in sling Painfull rom NMS intact       Assessment & Plan:  Rest/sling/ ice/  Start rehab 3-5d

## 2011-11-30 ENCOUNTER — Ambulatory Visit (INDEPENDENT_AMBULATORY_CARE_PROVIDER_SITE_OTHER): Payer: 59 | Admitting: Internal Medicine

## 2011-11-30 ENCOUNTER — Encounter: Payer: Self-pay | Admitting: Internal Medicine

## 2011-11-30 VITALS — BP 142/82 | HR 83 | Temp 98.4°F | Resp 18 | Ht 70.0 in | Wt 309.8 lb

## 2011-11-30 DIAGNOSIS — IMO0002 Reserved for concepts with insufficient information to code with codable children: Secondary | ICD-10-CM

## 2011-11-30 DIAGNOSIS — S46911A Strain of unspecified muscle, fascia and tendon at shoulder and upper arm level, right arm, initial encounter: Secondary | ICD-10-CM

## 2011-11-30 DIAGNOSIS — Z23 Encounter for immunization: Secondary | ICD-10-CM

## 2011-11-30 NOTE — Patient Instructions (Addendum)
Obesity Obesity is defined as having a body mass index (BMI) of 30 or more. To calculate your BMI divide your weight in pounds by your height in inches squared and multiply that product by 703. Major illnesses resulting from long-term obesity include:  Stroke.   Heart disease.   Diabetes.   Many cancers.   Arthritis.  Obesity also complicates recovery from many other medical problems.  CAUSES   A history of obesity in your parents.   Thyroid hormone imbalance.   Environmental factors such as excess calorie intake and physical inactivity.  TREATMENT  A healthy weight loss program includes:  A calorie restricted diet based on individual calorie needs.   Increased physical activity (exercise).  An exercise program is just as important as the right low-calorie diet.  Weight-loss medicines should be used only under the supervision of your physician. These medicines help, but only if they are used with diet and exercise programs. Medicines can have side effects including nervousness, nausea, abdominal pain, diarrhea, headache, drowsiness, and depression.  An unhealthy weight loss program includes:  Fasting.   Fad diets.   Supplements and drugs.  These choices do not succeed in long-term weight control.  HOME CARE INSTRUCTIONS  To help you make the needed dietary changes:   Exercise and perform physical activity as directed by your caregiver.   Keep a daily record of everything you eat. There are many free websites to help you with this. It may be helpful to measure your foods so you can determine if you are eating the correct portion sizes.   Use low-calorie cookbooks or take special cooking classes.   Avoid alcohol. Drink more water and drinks with no calories.   Take vitamins and supplements only as recommended by your caregiver.   Weight loss support groups, Registered Dieticians, counselors, and stress reduction education can also be very helpful.  Document Released:  04/06/2004 Document Revised: 02/16/2011 Document Reviewed: 02/03/2007 [employer] Patient Information 2012 [employer]. 

## 2011-11-30 NOTE — Progress Notes (Signed)
  Subjective:    Patient ID: Carl [name], male    DOB: 1965-10-11, 46 y.o.   MRN: [id number]  HPI Improved, still painful to abduct. No nms loss.  Review of Systems     Objective:   Physical Exam Full rom with pain on lateral abduction NMS intact.       Assessment & Plan:  Refer foe eval by Dr. London Sheer

## 2011-12-04 ENCOUNTER — Telehealth: Payer: Self-pay

## 2011-12-04 NOTE — Telephone Encounter (Signed)
The patient is requesting to pick up cd of his shoulder xrays done 11/24/11 or 11/28/11.  The patient needs to pick up cd by 9am tomorrow 12/05/11.  Patient phone # [phone].

## 2011-12-05 NOTE — Telephone Encounter (Signed)
Have given this to xray

## 2012-05-27 ENCOUNTER — Ambulatory Visit (INDEPENDENT_AMBULATORY_CARE_PROVIDER_SITE_OTHER): Payer: 59 | Admitting: Internal Medicine

## 2012-05-27 VITALS — BP 156/94 | HR 67 | Temp 97.5°F | Resp 20 | Ht 71.0 in | Wt 306.0 lb

## 2012-05-27 DIAGNOSIS — E119 Type 2 diabetes mellitus without complications: Secondary | ICD-10-CM

## 2012-05-27 DIAGNOSIS — R110 Nausea: Secondary | ICD-10-CM

## 2012-05-27 DIAGNOSIS — R197 Diarrhea, unspecified: Secondary | ICD-10-CM

## 2012-05-27 LAB — COMPREHENSIVE METABOLIC PANEL
ALT: 48 U/L (ref 0–53)
AST: 39 U/L — ABNORMAL HIGH (ref 0–37)
Albumin: 4.3 g/dL (ref 3.5–5.2)
Alkaline Phosphatase: 67 U/L (ref 39–117)
BUN: 9 mg/dL (ref 6–23)
CO2: 27 mEq/L (ref 19–32)
Calcium: 9.1 mg/dL (ref 8.4–10.5)
Chloride: 100 mEq/L (ref 96–112)
Creat: 0.49 mg/dL — ABNORMAL LOW (ref 0.50–1.35)
Glucose, Bld: 209 mg/dL — ABNORMAL HIGH (ref 70–99)
Potassium: 4 mEq/L (ref 3.5–5.3)
Sodium: 136 mEq/L (ref 135–145)
Total Bilirubin: 0.7 mg/dL (ref 0.3–1.2)
Total Protein: 6.5 g/dL (ref 6.0–8.3)

## 2012-05-27 LAB — POCT CBC
Granulocyte percent: 62 %G (ref 37–80)
HCT, POC: 44.3 % (ref 43.5–53.7)
Hemoglobin: 14.2 g/dL (ref 14.1–18.1)
Lymph, poc: 2.2 (ref 0.6–3.4)
MCH, POC: 28.8 pg (ref 27–31.2)
MCHC: 32.1 g/dL (ref 31.8–35.4)
MCV: 89.9 fL (ref 80–97)
MID (cbc): 0.5 (ref 0–0.9)
MPV: 9.9 fL (ref 0–99.8)
POC Granulocyte: 4.3 (ref 2–6.9)
POC LYMPH PERCENT: 30.8 %L (ref 10–50)
POC MID %: 7.2 %M (ref 0–12)
Platelet Count, POC: 255 10*3/uL (ref 142–424)
RBC: 4.93 M/uL (ref 4.69–6.13)
RDW, POC: 14.1 %
WBC: 7 10*3/uL (ref 4.6–10.2)

## 2012-05-27 LAB — GLUCOSE, POCT (MANUAL RESULT ENTRY): POC Glucose: 201 mg/dl — AB (ref 70–99)

## 2012-05-27 LAB — POCT GLYCOSYLATED HEMOGLOBIN (HGB A1C): Hemoglobin A1C: 9.3

## 2012-05-27 MED ORDER — ONDANSETRON HCL 8 MG PO TABS: 8.0000 mg | ORAL_TABLET | Freq: Three times a day (TID) | ORAL | Status: DC | PRN

## 2012-05-27 NOTE — Patient Instructions (Addendum)
Diet for Diarrhea, Adult Having frequent, runny stools (diarrhea) has many causes. Diarrhea may be caused or worsened by food or drink. Diarrhea may be relieved by changing your diet. IF YOU ARE NOT TOLERATING SOLID FOODS:  Drink enough water and fluids to keep your urine clear or pale yellow.  Avoid sugary drinks and sodas as well as milk-based beverages.  Avoid beverages containing caffeine and alcohol.  You may try rehydrating beverages. You can make your own by following this recipe:   tsp table salt.   tsp baking soda.   tsp salt substitute (potassium chloride).  1 tbs + 1 tsp sugar.  1 qt water. As your stools become more solid, you can start eating solid foods. Add foods one at a time. If a certain food causes your diarrhea to get worse, avoid that food and try other foods. A low fiber, low-fat, and lactose-free diet is recommended. Small, frequent meals may be better tolerated.  Starches  Allowed:  White, [place], and pita breads, plain rolls, buns, bagels. Plain muffins, matzo. Soda, saltine, or graham crackers. Pretzels, melba toast, zwieback. Cooked cereals made with water: cornmeal, farina, cream cereals. Dry cereals: refined corn, wheat, rice. Potatoes prepared any way without skins, refined macaroni, spaghetti, noodles, refined rice.  Avoid:  Bread, rolls, or crackers made with whole wheat, multi-grains, rye, bran seeds, nuts, or coconut. Corn tortillas or taco shells. Cereals containing whole grains, multi-grains, bran, coconut, nuts, or raisins. Cooked or dry oatmeal. Coarse wheat cereals, granola. Cereals advertised as "high-fiber." Potato skins. Whole grain pasta, wild or brown rice. Popcorn. Sweet potatoes/yams. Sweet rolls, doughnuts, waffles, pancakes, sweet breads. Vegetables  Allowed: Strained tomato and vegetable juices. Most well-cooked and canned vegetables without seeds. Fresh: Tender lettuce, cucumber without the skin, cabbage, spinach, bean  sprouts.  Avoid: Fresh, cooked, or canned: Artichokes, baked beans, beet greens, broccoli, Brussels sprouts, corn, kale, legumes, peas, sweet potatoes. Cooked: Green or red cabbage, spinach. Avoid large servings of any vegetables, because vegetables shrink when cooked, and they contain more fiber per serving than fresh vegetables. Fruit  Allowed: All fruit juices except prune juice. Cooked or canned: Apricots, applesauce, cantaloupe, cherries, fruit cocktail, grapefruit, grapes, kiwi, mandarin oranges, peaches, pears, plums, watermelon. Fresh: Apples without skin, ripe banana, grapes, cantaloupe, cherries, grapefruit, peaches, oranges, plums. Keep servings limited to  cup or 1 piece.  Avoid: Fresh: Apple with skin, apricots, mango, pears, raspberries, strawberries. Prune juice, stewed or dried prunes. Dried fruits, raisins, dates. Large servings of all fresh fruits. Meat and Meat Substitutes  Allowed: Ground or well-cooked tender beef, ham, veal, lamb, pork, or poultry. Eggs, plain cheese. Fish, oysters, shrimp, lobster, other seafoods. Liver, organ meats.  Avoid: Tough, fibrous meats with gristle. Peanut butter, smooth or chunky. Cheese, nuts, seeds, legumes, dried peas, beans, lentils. Milk  Allowed: Yogurt, lactose-free milk, kefir, drinkable yogurt, buttermilk, soy milk.  Avoid: Milk, chocolate milk, beverages made with milk, such as milk shakes. Soups  Allowed: Bouillon, broth, or soups made from allowed foods. Any strained soup.  Avoid: Soups made from vegetables that are not allowed, cream or milk-based soups. Desserts and Sweets  Allowed: Sugar-free gelatin, sugar-free frozen ice pops made without sugar alcohol.  Avoid: Plain cakes and cookies, pie made with allowed fruit, pudding, custard, cream pie. Gelatin, fruit, ice, sherbet, frozen ice pops. Ice cream, ice milk without nuts. Plain hard candy, honey, jelly, molasses, syrup, sugar, chocolate syrup, gumdrops,  marshmallows. Fats and Oils  Allowed: Avoid any fats and oils.  Avoid:  Seeds, nuts, olives, avocados. Margarine, butter, cream, mayonnaise, salad oils, plain salad dressings made from allowed foods. Plain gravy, crisp bacon without rind. Beverages  Allowed: Water, decaffeinated teas, oral rehydration solutions, sugar-free beverages.  Avoid: Fruit juices, caffeinated beverages (coffee, tea, soda or pop), alcohol, sports drinks, or lemon-lime soda or pop. Condiments  Allowed: Ketchup, mustard, horseradish, vinegar, cream sauce, cheese sauce, cocoa powder. Spices in moderation: allspice, basil, bay leaves, celery powder or leaves, cinnamon, cumin powder, curry powder, ginger, mace, marjoram, onion or garlic powder, oregano, paprika, parsley flakes, ground pepper, rosemary, sage, savory, tarragon, thyme, turmeric.  Avoid: Coconut, honey. Weight Monitoring: Weigh yourself every day. You should weigh yourself in the morning after you urinate and before you eat breakfast. Wear the same amount of clothing when you weigh yourself. Record your weight daily. Bring your recorded weights to your clinic visits. Tell your caregiver right away if you have gained 3 lb/1.4 kg or more in 1 day, 5 lb/2.3 kg in a week, or whatever amount you were told to report. SEEK IMMEDIATE MEDICAL CARE IF:   You are unable to keep fluids down.  You start to throw up (vomit) or diarrhea keeps coming back (persistent).  Abdominal pain develops, increases, or can be felt in one place (localizes).  You have an oral temperature above 102 F (38.9 C), not controlled by medicine.  Diarrhea contains blood or mucus.  You develop excessive weakness, dizziness, fainting, or extreme thirst. MAKE SURE YOU:   Understand these instructions.  Will watch your condition.  Will get help right away if you are not doing well or get worse. Document Released: 05/20/2003 Document Revised: 05/22/2011 Document Reviewed:  07/14/2011 [facility] Patient Information 2013 [place], [place]. B.R.A.T. Diet Your doctor has recommended the B.R.A.T. diet for you or your child until the condition improves. This is often used to help control diarrhea and vomiting symptoms. If you or your child can tolerate clear liquids, you may have:  Bananas.   Rice.   Applesauce.   Toast (and other simple starches such as crackers, potatoes, noodles).  Be sure to avoid dairy products, meats, and fatty foods until symptoms are better. Fruit juices such as apple, grape, and prune juice can make diarrhea worse. Avoid these. Continue this diet for 2 days or as instructed by your caregiver. Document Released: 02/27/2005 Document Revised: 02/16/2011 Document Reviewed: 08/16/2006 [facility] Patient Information 2012 [place], [place].Viral Gastroenteritis Viral gastroenteritis is also known as stomach flu. This condition affects the stomach and intestinal tract. It can cause sudden diarrhea and vomiting. The illness typically lasts 3 to 8 days. Most people develop an immune response that eventually gets rid of the virus. While this natural response develops, the virus can make you quite ill. CAUSES  Many different viruses can cause gastroenteritis, such as rotavirus or noroviruses. You can catch one of these viruses by consuming contaminated food or water. You may also catch a virus by sharing utensils or other personal items with an infected person or by touching a contaminated surface. SYMPTOMS  The most common symptoms are diarrhea and vomiting. These problems can cause a severe loss of body fluids (dehydration) and a body salt (electrolyte) imbalance. Other symptoms may include:  Fever.  Headache.  Fatigue.  Abdominal pain. DIAGNOSIS  Your caregiver can usually diagnose viral gastroenteritis based on your symptoms and a physical exam. A stool sample may also be taken to test for the presence of viruses or other infections. TREATMENT  This  illness typically goes away  on its own. Treatments are aimed at rehydration. The most serious cases of viral gastroenteritis involve vomiting so severely that you are not able to keep fluids down. In these cases, fluids must be given through an intravenous line (IV). HOME CARE INSTRUCTIONS   Drink enough fluids to keep your urine clear or pale yellow. Drink small amounts of fluids frequently and increase the amounts as tolerated.  Ask your caregiver for specific rehydration instructions.  Avoid:  Foods high in sugar.  Alcohol.  Carbonated drinks.  Tobacco.  Juice.  Caffeine drinks.  Extremely hot or cold fluids.  Fatty, greasy foods.  Too much intake of anything at one time.  Dairy products until 24 to 48 hours after diarrhea stops.  You may consume probiotics. Probiotics are active cultures of beneficial bacteria. They may lessen the amount and number of diarrheal stools in adults. Probiotics can be found in yogurt with active cultures and in supplements.  Wash your hands well to avoid spreading the virus.  Only take over-the-counter or prescription medicines for pain, discomfort, or fever as directed by your caregiver. Do not give aspirin to children. Antidiarrheal medicines are not recommended.  Ask your caregiver if you should continue to take your regular prescribed and over-the-counter medicines.  Keep all follow-up appointments as directed by your caregiver. SEEK IMMEDIATE MEDICAL CARE IF:   You are unable to keep fluids down.  You do not urinate at least once every 6 to 8 hours.  You develop shortness of breath.  You notice blood in your stool or vomit. This may look like coffee grounds.  You have abdominal pain that increases or is concentrated in one small area (localized).  You have persistent vomiting or diarrhea.  You have a fever.  The patient is a child younger than 3 months, and he or she has a fever.  The patient is a child older than 3 months,  and he or she has a fever and persistent symptoms.  The patient is a child older than 3 months, and he or she has a fever and symptoms suddenly get worse.  The patient is a baby, and he or she has no tears when crying. MAKE SURE YOU:   Understand these instructions.  Will watch your condition.  Will get help right away if you are not doing well or get worse. Document Released: 02/27/2005 Document Revised: 05/22/2011 Document Reviewed: 12/14/2010 [facility] Patient Information 2013 [place], [place].

## 2012-05-27 NOTE — Progress Notes (Signed)
  Subjective:    Patient ID: Carl [name], male    DOB: 1965/07/24, 47 y.o.   MRN: [id number]  HPI Has had diarrhea for 1 week, still having about 10 stools per day, watery and dark but using peptobismol. Has minoimal pain, mild nausea and anorexia, no vomiting. Is obese and has T2D, has been controlled.   Review of Systems     Objective:   Physical Exam  Vitals reviewed. Constitutional: He is oriented to person, place, and time. He appears well-nourished.  HENT:  Nose: Nose normal.  Mouth/Throat: Oropharynx is clear and moist.  Cardiovascular: Normal rate, regular rhythm and normal heart sounds.   Pulmonary/Chest: Effort normal and breath sounds normal.  Abdominal: Soft. Bowel sounds are normal. He exhibits no mass. There is no tenderness. There is no guarding.  Neurological: He is alert and oriented to person, place, and time. He exhibits normal muscle tone. Coordination normal.  Psychiatric: He has a normal mood and affect.   Results for orders placed in visit on 05/27/12  GLUCOSE, POCT (MANUAL RESULT ENTRY)      Result Value Range   POC Glucose 201 (*) 70 - 99 mg/dl  POCT CBC      Result Value Range   WBC 7.0  4.6 - 10.2 K/uL   Lymph, poc 2.2  0.6 - 3.4   POC LYMPH PERCENT 30.8  10 - 50 %L   MID (cbc) 0.5  0 - 0.9   POC MID % 7.2  0 - 12 %M   POC Granulocyte 4.3  2 - 6.9   Granulocyte percent 62.0  37 - 80 %G   RBC 4.93  4.69 - 6.13 M/uL   Hemoglobin 14.2  14.1 - 18.1 g/dL   HCT, POC 40.9  81.1 - 53.7 %   MCV 89.9  80 - 97 fL   MCH, POC 28.8  27 - 31.2 pg   MCHC 32.1  31.8 - 35.4 g/dL   RDW, POC 91.4     Platelet Count, POC 255  142 - 424 K/uL   MPV 9.9  0 - 99.8 fL  POCT GLYCOSYLATED HEMOGLOBIN (HGB A1C)      Result Value Range   Hemoglobin A1C 9.3            Assessment & Plan:  Viral gastroenteritis No dairy/BRAT diet Zofran prn

## 2012-06-12 ENCOUNTER — Ambulatory Visit (INDEPENDENT_AMBULATORY_CARE_PROVIDER_SITE_OTHER): Payer: 59 | Admitting: Family Medicine

## 2012-06-12 VITALS — BP 148/76 | HR 69 | Temp 98.5°F | Resp 12 | Ht 70.0 in | Wt 301.0 lb

## 2012-06-12 DIAGNOSIS — K029 Dental caries, unspecified: Secondary | ICD-10-CM

## 2012-06-12 MED ORDER — HYDROCODONE-ACETAMINOPHEN 5-325 MG PO TABS: 1.0000 | ORAL_TABLET | Freq: Four times a day (QID) | ORAL | Status: DC | PRN

## 2012-06-12 MED ORDER — AMOXICILLIN 875 MG PO TABS: 875.0000 mg | ORAL_TABLET | Freq: Two times a day (BID) | ORAL | Status: DC

## 2012-06-12 NOTE — Patient Instructions (Signed)
Keep your mouth well rinsed out.  Dental floss as needed  See a dentist as soon as possible

## 2012-06-12 NOTE — Progress Notes (Signed)
Subjective: Patient has been having a little dental pain but it flared on him badly last night on his left upper molars. He knows he has a cavity that needs to be worked on.  Objective: Poor dentition with some dental caries. There is some infection starting into the common in his left upper gum margin. It's quite tender. No nodes were noted.  Assessment: Dental caries and early infection  Plan: Amoxicillin 875 twice a day Norco 5 #15 one every 4-6 when necessary pain

## 2013-03-21 ENCOUNTER — Ambulatory Visit (INDEPENDENT_AMBULATORY_CARE_PROVIDER_SITE_OTHER): Payer: 59 | Admitting: Internal Medicine

## 2013-03-21 VITALS — BP 134/80 | HR 68 | Temp 98.7°F | Resp 18 | Ht 69.75 in | Wt 291.0 lb

## 2013-03-21 DIAGNOSIS — R109 Unspecified abdominal pain: Secondary | ICD-10-CM

## 2013-03-21 DIAGNOSIS — K2990 Gastroduodenitis, unspecified, without bleeding: Secondary | ICD-10-CM

## 2013-03-21 DIAGNOSIS — E119 Type 2 diabetes mellitus without complications: Secondary | ICD-10-CM

## 2013-03-21 DIAGNOSIS — J329 Chronic sinusitis, unspecified: Secondary | ICD-10-CM

## 2013-03-21 DIAGNOSIS — K2970 Gastritis, unspecified, without bleeding: Secondary | ICD-10-CM

## 2013-03-21 DIAGNOSIS — R350 Frequency of micturition: Secondary | ICD-10-CM

## 2013-03-21 LAB — POCT URINALYSIS DIPSTICK
Bilirubin, UA: NEGATIVE
Glucose, UA: 500
Ketones, UA: NEGATIVE
Leukocytes, UA: NEGATIVE
Nitrite, UA: NEGATIVE
Protein, UA: NEGATIVE
Spec Grav, UA: 1.025
Urobilinogen, UA: 0.2
pH, UA: 5

## 2013-03-21 LAB — COMPREHENSIVE METABOLIC PANEL
ALT: 43 U/L (ref 0–53)
AST: 31 U/L (ref 0–37)
Albumin: 4.4 g/dL (ref 3.5–5.2)
Alkaline Phosphatase: 73 U/L (ref 39–117)
BUN: 14 mg/dL (ref 6–23)
CO2: 23 mEq/L (ref 19–32)
Calcium: 9.3 mg/dL (ref 8.4–10.5)
Chloride: 100 mEq/L (ref 96–112)
Creat: 0.59 mg/dL (ref 0.50–1.35)
Glucose, Bld: 290 mg/dL — ABNORMAL HIGH (ref 70–99)
Potassium: 4.4 mEq/L (ref 3.5–5.3)
Sodium: 135 mEq/L (ref 135–145)
Total Bilirubin: 0.6 mg/dL (ref 0.3–1.2)
Total Protein: 7.1 g/dL (ref 6.0–8.3)

## 2013-03-21 LAB — POCT CBC
Granulocyte percent: 63.2 %G (ref 37–80)
HCT, POC: 47.9 % (ref 43.5–53.7)
Hemoglobin: 15.5 g/dL (ref 14.1–18.1)
Lymph, poc: 2.5 (ref 0.6–3.4)
MCH, POC: 29.5 pg (ref 27–31.2)
MCHC: 32.4 g/dL (ref 31.8–35.4)
MCV: 91.2 fL (ref 80–97)
MID (cbc): 0.7 (ref 0–0.9)
MPV: 10.7 fL (ref 0–99.8)
POC Granulocyte: 5.6 (ref 2–6.9)
POC LYMPH PERCENT: 28.6 %L (ref 10–50)
POC MID %: 8.2 %M (ref 0–12)
Platelet Count, POC: 214 10*3/uL (ref 142–424)
RBC: 5.25 M/uL (ref 4.69–6.13)
RDW, POC: 15.4 %
WBC: 8.9 10*3/uL (ref 4.6–10.2)

## 2013-03-21 LAB — LIPID PANEL
Cholesterol: 217 mg/dL — ABNORMAL HIGH (ref 0–200)
HDL: 30 mg/dL — ABNORMAL LOW (ref >39)
Total CHOL/HDL Ratio: 7.2 Ratio
Triglycerides: 568 mg/dL — ABNORMAL HIGH (ref <150)

## 2013-03-21 LAB — POCT UA - MICROSCOPIC ONLY
Bacteria, U Microscopic: NEGATIVE
Casts, Ur, LPF, POC: NEGATIVE
Crystals, Ur, HPF, POC: NEGATIVE
Mucus, UA: NEGATIVE
Yeast, UA: NEGATIVE

## 2013-03-21 LAB — POCT GLYCOSYLATED HEMOGLOBIN (HGB A1C): Hemoglobin A1C: 10.7

## 2013-03-21 LAB — PSA: PSA: 0.13 ng/mL (ref <=4.00)

## 2013-03-21 LAB — GLUCOSE, POCT (MANUAL RESULT ENTRY): POC Glucose: 295 mg/dl — AB (ref 70–99)

## 2013-03-21 MED ORDER — AZITHROMYCIN 500 MG PO TABS: 500.0000 mg | ORAL_TABLET | Freq: Every day | ORAL | Status: DC

## 2013-03-21 MED ORDER — METFORMIN HCL 1000 MG PO TABS: 1000.0000 mg | ORAL_TABLET | Freq: Two times a day (BID) | ORAL | Status: DC

## 2013-03-21 MED ORDER — TAMSULOSIN HCL 0.4 MG PO CAPS: 0.4000 mg | ORAL_CAPSULE | Freq: Every day | ORAL | Status: DC

## 2013-03-21 MED ORDER — LISINOPRIL 10 MG PO TABS: 10.0000 mg | ORAL_TABLET | Freq: Every day | ORAL | Status: DC

## 2013-03-21 MED ORDER — PRAVASTATIN SODIUM 40 MG PO TABS: 40.0000 mg | ORAL_TABLET | Freq: Every day | ORAL | Status: DC

## 2013-03-21 NOTE — Progress Notes (Signed)
Subjective:    Patient ID: Carl [name], male    DOB: 1965/12/20, 48 y.o.   MRN: [id number]  HPI Patient comes into our office with URI that's been going on since Monday Patient is also a diabetic that has lost a little over 50 pounds in 8 months  Also has crampy diarrhea for three days , down to 1-2 loose stools per day, no vomiting or fever. No pain now. Has not been checking home glucoses, has not followed up in9 months Review of Systems  Constitutional: Positive for chills and fatigue. Negative for fever.       Some sweats   HENT: Positive for postnasal drip, sinus pressure and sore throat. Negative for ear pain, sneezing and trouble swallowing.   Respiratory: Positive for cough. Negative for shortness of breath and wheezing.   Gastrointestinal: Positive for diarrhea. Negative for nausea, vomiting and abdominal pain.       Diarrhea that's been going on since Wednesday  Neurological: Positive for headaches.       Objective:   Physical Exam  Constitutional: He is oriented to person, place, and time. He appears well-nourished. No distress.  HENT:  Head: Normocephalic.  Right Ear: External ear normal.  Left Ear: External ear normal.  Nose: Mucosal edema, rhinorrhea and sinus tenderness present. Right sinus exhibits no maxillary sinus tenderness and no frontal sinus tenderness. Left sinus exhibits no maxillary sinus tenderness and no frontal sinus tenderness.  Mouth/Throat: Oropharynx is clear and moist.  Eyes: EOM are normal. Pupils are equal, round, and reactive to light.  Neck: Normal range of motion.  Cardiovascular: Normal rate and normal heart sounds.   Pulmonary/Chest: Effort normal and breath sounds normal.  Abdominal: Soft. He exhibits no mass. Bowel sounds are increased. There is no hepatosplenomegaly. There is no tenderness. There is no CVA tenderness.  Neurological: He is alert and oriented to person, place, and time. He has normal strength. No cranial nerve deficit or  sensory deficit. He exhibits normal muscle tone. Coordination normal.  Psychiatric: He has a normal mood and affect.   Results for orders placed in visit on 03/21/13  POCT CBC      Result Value Range   WBC 8.9  4.6 - 10.2 K/uL   Lymph, poc 2.5  0.6 - 3.4   POC LYMPH PERCENT 28.6  10 - 50 %L   MID (cbc) 0.7  0 - 0.9   POC MID % 8.2  0 - 12 %M   POC Granulocyte 5.6  2 - 6.9   Granulocyte percent 63.2  37 - 80 %G   RBC 5.25  4.69 - 6.13 M/uL   Hemoglobin 15.5  14.1 - 18.1 g/dL   HCT, POC 88.4  16.6 - 53.7 %   MCV 91.2  80 - 97 fL   MCH, POC 29.5  27 - 31.2 pg   MCHC 32.4  31.8 - 35.4 g/dL   RDW, POC 06.3     Platelet Count, POC 214  142 - 424 K/uL   MPV 10.7  0 - 99.8 fL  GLUCOSE, POCT (MANUAL RESULT ENTRY)      Result Value Range   POC Glucose 295 (*) 70 - 99 mg/dl  POCT GLYCOSYLATED HEMOGLOBIN (HGB A1C)      Result Value Range   Hemoglobin A1C 10.7    POCT URINALYSIS DIPSTICK      Result Value Range   Color, UA yellow     Clarity, UA clear  Glucose, UA 500     Bilirubin, UA neg     Ketones, UA neg     Spec Grav, UA 1.025     Blood, UA trace-intact     pH, UA 5.0     Protein, UA neg     Urobilinogen, UA 0.2     Nitrite, UA neg     Leukocytes, UA Negative    POCT UA - MICROSCOPIC ONLY      Result Value Range   WBC, Ur, HPF, POC 4-6     RBC, urine, microscopic 0-1     Bacteria, U Microscopic neg     Mucus, UA neg     Epithelial cells, urine per micros 0-1     Crystals, Ur, HPF, POC neg     Casts, Ur, LPF, POC neg     Yeast, UA neg            Assessment & Plan:  Viral gastroenteritis  Bacterial rhinitis Early cough NIDDM uncontrolled

## 2013-03-21 NOTE — Progress Notes (Deleted)
   Subjective:    Patient ID: Carl [name], male    DOB: 1966/03/07, 48 y.o.   MRN: [id number]  HPI    Review of Systems     Objective:   Physical Exam        Assessment & Plan:

## 2013-03-21 NOTE — Patient Instructions (Signed)
Sinusitis Sinusitis is redness, soreness, and swelling (inflammation) of the paranasal sinuses. Paranasal sinuses are air pockets within the bones of your face (beneath the eyes, the middle of the forehead, or above the eyes). In healthy paranasal sinuses, mucus is able to drain out, and air is able to circulate through them by way of your nose. However, when your paranasal sinuses are inflamed, mucus and air can become trapped. This can allow bacteria and other germs to grow and cause infection. Sinusitis can develop quickly and last only a short time (acute) or continue over a long period (chronic). Sinusitis that lasts for more than 12 weeks is considered chronic.  CAUSES  Causes of sinusitis include:  Allergies.  Structural abnormalities, such as displacement of the cartilage that separates your nostrils (deviated septum), which can decrease the air flow through your nose and sinuses and affect sinus drainage.  Functional abnormalities, such as when the small hairs (cilia) that line your sinuses and help remove mucus do not work properly or are not present. SYMPTOMS  Symptoms of acute and chronic sinusitis are the same. The primary symptoms are pain and pressure around the affected sinuses. Other symptoms include:  Upper toothache.  Earache.  Headache.  Bad breath.  Decreased sense of smell and taste.  A cough, which worsens when you are lying flat.  Fatigue.  Fever.  Thick drainage from your nose, which often is green and may contain pus (purulent).  Swelling and warmth over the affected sinuses. DIAGNOSIS  Your caregiver will perform a physical exam. During the exam, your caregiver may:  Look in your nose for signs of abnormal growths in your nostrils (nasal polyps).  Tap over the affected sinus to check for signs of infection.  View the inside of your sinuses (endoscopy) with a special imaging device with a light attached (endoscope), which is inserted into your  sinuses. If your caregiver suspects that you have chronic sinusitis, one or more of the following tests may be recommended:  Allergy tests.  Nasal culture A sample of mucus is taken from your nose and sent to a lab and screened for bacteria.  Nasal cytology A sample of mucus is taken from your nose and examined by your caregiver to determine if your sinusitis is related to an allergy. TREATMENT  Most cases of acute sinusitis are related to a viral infection and will resolve on their own within 10 days. Sometimes medicines are prescribed to help relieve symptoms (pain medicine, decongestants, nasal steroid sprays, or saline sprays).  However, for sinusitis related to a bacterial infection, your caregiver will prescribe antibiotic medicines. These are medicines that will help kill the bacteria causing the infection.  Rarely, sinusitis is caused by a fungal infection. In theses cases, your caregiver will prescribe antifungal medicine. For some cases of chronic sinusitis, surgery is needed. Generally, these are cases in which sinusitis recurs more than 3 times per year, despite other treatments. HOME CARE INSTRUCTIONS   Drink plenty of water. Water helps thin the mucus so your sinuses can drain more easily.  Use a humidifier.  Inhale steam 3 to 4 times a day (for example, sit in the bathroom with the shower running).  Apply a warm, moist washcloth to your face 3 to 4 times a day, or as directed by your caregiver.  Use saline nasal sprays to help moisten and clean your sinuses.  Take over-the-counter or prescription medicines for pain, discomfort, or fever only as directed by your caregiver. SEEK IMMEDIATE MEDICAL   CARE IF:  You have increasing pain or severe headaches.  You have nausea, vomiting, or drowsiness.  You have swelling around your face.  You have vision problems.  You have a stiff neck.  You have difficulty breathing. MAKE SURE YOU:   Understand these  instructions.  Will watch your condition.  Will get help right away if you are not doing well or get worse. Document Released: 02/27/2005 Document Revised: 05/22/2011 Document Reviewed: 03/14/2011 [facility] Patient Information 2014 [place], [place]. Diet for Diarrhea, Adult Frequent, runny stools (diarrhea) may be caused or worsened by food or drink. Diarrhea may be relieved by changing your diet. Since diarrhea can last up to 7 days, it is easy for you to lose too much fluid from the body and become dehydrated. Fluids that are lost need to be replaced. Along with a modified diet, make sure you drink enough fluids to keep your urine clear or pale yellow. DIET INSTRUCTIONS  Ensure adequate fluid intake (hydration): have 1 cup (8 oz) of fluid for each diarrhea episode. Avoid fluids that contain simple sugars or sports drinks, fruit juices, whole milk products, and sodas. Your urine should be clear or pale yellow if you are drinking enough fluids. Hydrate with an oral rehydration solution that you can purchase at pharmacies, retail stores, and online. You can prepare an oral rehydration solution at home by mixing the following ingredients together:    tsp table salt.   tsp baking soda.   tsp salt substitute containing potassium chloride.  1  tablespoons sugar.  1 L (34 oz) of water.  Certain foods and beverages may increase the speed at which food moves through the gastrointestinal (GI) tract. These foods and beverages should be avoided and include:  Caffeinated and alcoholic beverages.  High-fiber foods, such as raw fruits and vegetables, nuts, seeds, and whole grain breads and cereals.  Foods and beverages sweetened with sugar alcohols, such as xylitol, sorbitol, and mannitol.  Some foods may be well tolerated and may help thicken stool including:  Starchy foods, such as rice, toast, pasta, low-sugar cereal, oatmeal, grits, baked potatoes, crackers, and bagels.   Bananas.    Applesauce.  Add probiotic-rich foods to help increase healthy bacteria in the GI tract, such as yogurt and fermented milk products. RECOMMENDED FOODS AND BEVERAGES Starches Choose foods with less than 2 g of fiber per serving.  Recommended:  White, [place], and pita breads, plain rolls, buns, bagels. Plain muffins, matzo. Soda, saltine, or graham crackers. Pretzels, melba toast, zwieback. Cooked cereals made with water: cornmeal, farina, cream cereals. Dry cereals: refined corn, wheat, rice. Potatoes prepared any way without skins, refined macaroni, spaghetti, noodles, refined rice.  Avoid:  Bread, rolls, or crackers made with whole wheat, multi-grains, rye, bran seeds, nuts, or coconut. Corn tortillas or taco shells. Cereals containing whole grains, multi-grains, bran, coconut, nuts, raisins. Cooked or dry oatmeal. Coarse wheat cereals, granola. Cereals advertised as "high-fiber." Potato skins. Whole grain pasta, wild or brown rice. Popcorn. Sweet potatoes, yams. Sweet rolls, doughnuts, waffles, pancakes, sweet breads. Vegetables  Recommended: Strained tomato and vegetable juices. Most well-cooked and canned vegetables without seeds. Fresh: Tender lettuce, cucumber without the skin, cabbage, spinach, bean sprouts.  Avoid: Fresh, cooked, or canned: Artichokes, baked beans, beet greens, broccoli, Brussels sprouts, corn, kale, legumes, peas, sweet potatoes. Cooked: Green or red cabbage, spinach. Avoid large servings of any vegetables because vegetables shrink when cooked, and they contain more fiber per serving than fresh vegetables. Fruit  Recommended: Cooked or  canned: Apricots, applesauce, cantaloupe, cherries, fruit cocktail, grapefruit, grapes, kiwi, mandarin oranges, peaches, pears, plums, watermelon. Fresh: Apples without skin, ripe banana, grapes, cantaloupe, cherries, grapefruit, peaches, oranges, plums. Keep servings limited to  cup or 1 piece.  Avoid: Fresh: Apples with skin,  apricots, mangoes, pears, raspberries, strawberries. Prune juice, stewed or dried prunes. Dried fruits, raisins, dates. Large servings of all fresh fruits. Protein  Recommended: Ground or well-cooked tender beef, ham, veal, lamb, pork, or poultry. Eggs. Fish, oysters, shrimp, lobster, other seafoods. Liver, organ meats.  Avoid: Tough, fibrous meats with gristle. Peanut butter, smooth or chunky. Cheese, nuts, seeds, legumes, dried peas, beans, lentils. Dairy  Recommended: Yogurt, lactose-free milk, kefir, drinkable yogurt, buttermilk, soy milk, or plain hard cheese.  Avoid: Milk, chocolate milk, beverages made with milk, such as milkshakes. Soups  Recommended: Bouillon, broth, or soups made from allowed foods. Any strained soup.  Avoid: Soups made from vegetables that are not allowed, cream or milk-based soups. Desserts and Sweets  Recommended: Sugar-free gelatin, sugar-free frozen ice pops made without sugar alcohol.  Avoid: Plain cakes and cookies, pie made with fruit, pudding, custard, cream pie. Gelatin, fruit, ice, sherbet, frozen ice pops. Ice cream, ice milk without nuts. Plain hard candy, honey, jelly, molasses, syrup, sugar, chocolate syrup, gumdrops, marshmallows. Fats and Oils  Recommended: Limit fats to less than 8 tsp per day.  Avoid: Seeds, nuts, olives, avocados. Margarine, butter, cream, mayonnaise, salad oils, plain salad dressings. Plain gravy, crisp bacon without rind. Beverages  Recommended: Water, decaffeinated teas, oral rehydration solutions, sugar-free beverages not sweetened with sugar alcohols.  Avoid: Fruit juices, caffeinated beverages (coffee, tea, soda), alcohol, sports drinks, or lemon-lime soda. Condiments  Recommended: Ketchup, mustard, horseradish, vinegar, cocoa powder. Spices in moderation: allspice, basil, bay leaves, celery powder or leaves, cinnamon, cumin powder, curry powder, ginger, mace, marjoram, onion or garlic powder, oregano, paprika,  parsley flakes, ground pepper, rosemary, sage, savory, tarragon, thyme, turmeric.  Avoid: Coconut, honey. Document Released: 05/20/2003 Document Revised: 11/22/2011 Document Reviewed: 07/14/2011 [facility] Patient Information 2014 [place], [place].

## 2013-03-21 NOTE — Progress Notes (Signed)
   Subjective:    Patient ID: Carl [name], male    DOB: 05/14/1965, 47 y.o.   MRN: [id number]  HPI    Review of Systems     Objective:   Physical Exam        Assessment & Plan:   

## 2013-03-25 ENCOUNTER — Encounter: Payer: Self-pay | Admitting: Family Medicine

## 2013-04-08 ENCOUNTER — Ambulatory Visit (INDEPENDENT_AMBULATORY_CARE_PROVIDER_SITE_OTHER): Payer: 59 | Admitting: Family Medicine

## 2013-04-08 VITALS — BP 142/70 | HR 74 | Temp 98.7°F | Resp 16 | Ht 71.2 in | Wt 299.4 lb

## 2013-04-08 DIAGNOSIS — K0889 Other specified disorders of teeth and supporting structures: Secondary | ICD-10-CM

## 2013-04-08 DIAGNOSIS — K029 Dental caries, unspecified: Secondary | ICD-10-CM

## 2013-04-08 DIAGNOSIS — K089 Disorder of teeth and supporting structures, unspecified: Secondary | ICD-10-CM

## 2013-04-08 DIAGNOSIS — K047 Periapical abscess without sinus: Secondary | ICD-10-CM

## 2013-04-08 MED ORDER — AMOXICILLIN 875 MG PO TABS: 875.0000 mg | ORAL_TABLET | Freq: Two times a day (BID) | ORAL | Status: DC

## 2013-04-08 MED ORDER — HYDROCODONE-ACETAMINOPHEN 5-325 MG PO TABS: ORAL_TABLET | ORAL | Status: DC

## 2013-04-08 NOTE — Progress Notes (Signed)
Subjective: 48 year old man with known dental problems. He went to the dentist yesterday because of the pain, but his insurance had somehow become invalidated. He has to wait a day or 2 until his HR person returns to handle the problem. Therefore he came on in today.  He is hurting in the area of the top left incisors  Objective: Comes her anteriorly are erythematous and a little bit fluctuant though I cannot tell exactly where it is coming to a head yet. It is quite tender.  Assessment: Dental abscess and pain  Plan: Amoxicillin Hydrocodone He needs to see the dentist as it is taking it his payment issues straightened out

## 2013-04-08 NOTE — Patient Instructions (Signed)
Take the antibiotic amoxicillin 875 one pill twice daily. However today I would like you to take 3 pills, then resume the twice daily  Use of hydrocodone pain pills every 4-6 hours as needed for pain.   You can take ibuprofen 800 mg (4x200 mg) every 8 hours in addition to the hydrocodone if needed  See a dentist as soon as possible

## 2013-06-13 ENCOUNTER — Ambulatory Visit (INDEPENDENT_AMBULATORY_CARE_PROVIDER_SITE_OTHER): Payer: 59 | Admitting: Internal Medicine

## 2013-06-13 VITALS — BP 136/80 | HR 76 | Temp 97.6°F | Resp 18 | Ht 70.5 in | Wt 299.0 lb

## 2013-06-13 DIAGNOSIS — K1379 Other lesions of oral mucosa: Secondary | ICD-10-CM

## 2013-06-13 DIAGNOSIS — K1370 Unspecified lesions of oral mucosa: Secondary | ICD-10-CM

## 2013-06-13 DIAGNOSIS — K122 Cellulitis and abscess of mouth: Secondary | ICD-10-CM

## 2013-06-13 MED ORDER — AMOXICILLIN 500 MG PO CAPS: 1000.0000 mg | ORAL_CAPSULE | Freq: Two times a day (BID) | ORAL | Status: DC

## 2013-06-13 NOTE — Patient Instructions (Signed)
°  Dental Abscess °A dental abscess is a collection of infected fluid (pus) from a bacterial infection in the inner part of the tooth (pulp). It usually occurs at the end of the tooth's root.  °CAUSES  °· Severe tooth decay. °· Trauma to the tooth that allows bacteria to enter into the pulp, such as a broken or chipped tooth. °SYMPTOMS  °· Severe pain in and around the infected tooth. °· Swelling and redness around the abscessed tooth or in the mouth or face. °· Tenderness. °· Pus drainage. °· Bad breath. °· Bitter taste in the mouth. °· Difficulty swallowing. °· Difficulty opening the mouth. °· Nausea. °· Vomiting. °· Chills. °· Swollen neck glands. °DIAGNOSIS  °· A medical and dental history will be taken. °· An examination will be performed by tapping on the abscessed tooth. °· X-rays may be taken of the tooth to identify the abscess. °TREATMENT °The goal of treatment is to eliminate the infection. You may be prescribed antibiotic medicine to stop the infection from spreading. A root canal may be performed to save the tooth. If the tooth cannot be saved, it may be pulled (extracted) and the abscess may be drained.  °HOME CARE INSTRUCTIONS °· Only take over-the-counter or prescription medicines for pain, fever, or discomfort as directed by your caregiver. °· Rinse your mouth (gargle) often with salt water (¼ tsp salt in 8 oz [250 ml] of warm water) to relieve pain or swelling. °· Do not drive after taking pain medicine (narcotics). °· Do not apply heat to the outside of your face. °· Return to your dentist for further treatment as directed. °SEEK MEDICAL CARE IF: °· Your pain is not helped by medicine. °· Your pain is getting worse instead of better. °SEEK IMMEDIATE MEDICAL CARE IF: °· You have a fever or persistent symptoms for more than 2 3 days. °· You have a fever and your symptoms suddenly get worse. °· You have chills or a very bad headache. °· You have problems breathing or swallowing. °· You have trouble  opening your mouth. °· You have swelling in the neck or around the eye. °Document Released: 02/27/2005 Document Revised: 11/22/2011 Document Reviewed: 06/07/2010 °[employer]® Patient Information ©2014 [employer]. ° ° °

## 2013-06-13 NOTE — Progress Notes (Signed)
   Subjective:    Patient ID: Carl [name], male    DOB: 1965/06/22, 48 y.o.   MRN: [id number]  HPI Has abscess right upper gum line near incisor.   Review of Systems     Objective:   Physical Exam  Vitals reviewed. Constitutional: He is oriented to person, place, and time. He appears well-developed and well-nourished. No distress.  HENT:  Head: Normocephalic.  Mouth/Throat: Oropharynx is clear and moist and mucous membranes are normal. Dental abscesses present. No uvula swelling.  Eyes: EOM are normal.  Neck: Normal range of motion. Neck supple.  Pulmonary/Chest: Effort normal.  Neurological: He is alert and oriented to person, place, and time. He exhibits normal muscle tone. Coordination normal.  Psychiatric: He has a normal mood and affect.   Red and swollen with purulence at gum line.       Assessment & Plan:  Warm salt gargles Amoxil 1g bid See dentist

## 2013-06-18 ENCOUNTER — Ambulatory Visit (INDEPENDENT_AMBULATORY_CARE_PROVIDER_SITE_OTHER): Payer: 59 | Admitting: Emergency Medicine

## 2013-06-18 VITALS — BP 140/82 | HR 79 | Temp 97.8°F | Resp 18 | Wt 296.0 lb

## 2013-06-18 DIAGNOSIS — N5089 Other specified disorders of the male genital organs: Secondary | ICD-10-CM

## 2013-06-18 DIAGNOSIS — R220 Localized swelling, mass and lump, head: Secondary | ICD-10-CM

## 2013-06-18 DIAGNOSIS — K130 Diseases of lips: Secondary | ICD-10-CM

## 2013-06-18 DIAGNOSIS — N508 Other specified disorders of male genital organs: Secondary | ICD-10-CM

## 2013-06-18 LAB — RPR

## 2013-06-18 LAB — HIV ANTIBODY (ROUTINE TESTING W REFLEX): HIV 1&2 Ab, 4th Generation: NONREACTIVE

## 2013-06-18 LAB — HEPATITIS C ANTIBODY: HCV Ab: NEGATIVE

## 2013-06-18 MED ORDER — DOXYCYCLINE HYCLATE 100 MG PO TABS: 100.0000 mg | ORAL_TABLET | Freq: Two times a day (BID) | ORAL | Status: DC

## 2013-06-18 MED ORDER — VALACYCLOVIR HCL 1 G PO TABS: 1000.0000 mg | ORAL_TABLET | Freq: Two times a day (BID) | ORAL | Status: DC

## 2013-06-18 NOTE — Progress Notes (Signed)
  This chart was scribed for Collene GobbleSteven A Zeyna Mkrtchyan by Tana ConchStephen Methvin, ED Scribe. This patient was seen in room 6   Subjective:    Patient ID: Carl [name], male    DOB: 1965-10-14, 48 y.o.   MRN: [id number]  HPI  HPI Comments: Carl [name] is a 48 y.o. male who presents to the [facility] complaining of swelling in and around throat area in ddition to new lesion in and around his mouth. The lesions arose on Friday 06/13/13 and the swelling of the lips started Tuesday 05/17/13 at 6 PM. The patient was recently started on amoxicillin for a dental infection, but has had amoxicillin in the past multiple times for infections. The patient is currently taking his ACE inhibitor for HTN which he has been on for 5-6 years. Nothing has made the lesions or swelling worse but the patient took his dose of lisinopril this morning and did not take his amoxicillin this AM. The swelling has been pretty constant without worsening. He has never had lesions like these before. He also has had tenderness around his genitals for 2 days and is having unprotected sex with a new partner. He has not taken any medicine to relieve the swelling at home.        Review of Systems Gen: denies fever, night sweat, weight loss HEENT: admits lesions in and around mouth, lip swelling, denies throat swelling, trouble swallowing, ear pain, mouth pain Neuro: denies weakness, dizziness, or altered mental state Cardio: denies chest pain, palpitations, tachycardia Pulm: denies shortness of breath, wheeze, cough, or accessory muscle use GI: denies N/V/D abdominal pain Musculo: denies weakness or disruption of ADL's GU: denies dysuria, discharge, admits to pain on genitals     Objective:   Physical Exam Gen: alert and oriented x 3, no acute distress HEENT: minor swelling of the lips and surrounding face, no swelling of the throat, appropriate swallowing, nonerythematous throat, naso labial lesions, and interior lesions in the  oral cavity Cardio: regular rate and rhythm, S1 and S2, no murmur, rub or gallop, PMI in appropriate location Pulm: CBTA no wheeze, rhonchi or rales GI: soft nontender abdomnen, active bowel sounds, no organomegaly, no rebound or guarding GU: 1-3 active lesions on the shaft of the penis, no discharge, active smegma         Assessment & Plan:  A: 1) swelling lip likely 2/2 acute antibiotic reaction vs. Long term medication allergy 2) folliculitis face likely 2/2 MRSA vs. STD 3) Lesions genitals: likely 2/2 new sexual partner with unprotected sex  P:  1) stop ACE-I and amoxicillin, give zyrtec 10 mg in clinic and instruct to start oral antihistamine at home, RTC in 48 hours for re-check, follow up with Dr. Perrin MalteseGuest to determine appropriate new oral anti hypertensive 2) Wound and HSV culture of active oral lesions, HSV 1 and 2, RPR, HIV, Hep C, and urine G/C, follow up in 48 hours to discuss     Start valtrex 1 G BID #20 x 10 days and doxycycline 100 mg BID # 20 x 10 days 3) encourage return to dental clinic to discuss appropriate continued treatment.  Filed Vitals:   06/18/13 0742  BP: 140/82  Pulse: 79  Temp: 97.8 F (36.6 C)  Resp: 18   I personally performed the services described in this documentation, which was scribed in my presence. The recorded information has been reviewed and is accurate.

## 2013-06-18 NOTE — Patient Instructions (Addendum)
Please stop your amoxicillin. Please stop your lisinopril. Take the Valtrex and doxycycline as prescribed. Recheck in 48 hours  .Drug Allergy Allergic reactions to medicines are common. Some allergic reactions are mild. A delayed type of drug allergy that occurs 1 week or more after exposure to a medicine or vaccine is called serum sickness. A life-threatening, sudden (acute) allergic reaction that involves the whole body is called anaphylaxis. CAUSES  "True" drug allergies occur when there is an allergic reaction to a medicine. This is caused by overactivity of the immune system. First, the body becomes sensitized. The immune system is triggered by your first exposure to the medicine. Following this first exposure, future exposure to the same medicine may be life-threatening. Almost any medicine can cause an allergic reaction. Common ones are:  Penicillin.  Sulfonamides (sulfa drugs).  Local anesthetics.  X-ray dyes that contain iodine. SYMPTOMS  Common symptoms of a minor allergic reaction are:  Swelling around the mouth.  An itchy red rash or hives.  Vomiting or diarrhea. Anaphylaxis can cause swelling of the mouth and throat. This makes it difficult to breathe and swallow. Severe reactions can be fatal within seconds, even after exposure to only a trace amount of the drug that causes the reaction. HOME CARE INSTRUCTIONS   If you are unsure of what caused your reaction, keep a diary of foods and medicines used. Include the symptoms that followed. Avoid anything that causes reactions.  You may want to follow up with an allergy specialist after the reaction has cleared in order to be tested to confirm the allergy. It is important to confirm that your reaction is an allergy, not just a side effect to the medicine. If you have a true allergy to a medicine, this may prevent that medicine and related medicines from being given to you when you are very ill.  If you have hives or a  rash:  Take medicines as directed by your caregiver.  You may use an over-the-counter antihistamine (diphenhydramine) as needed.  Apply cold compresses to the skin or take baths in cool water. Avoid hot baths or showers.  If you are severely allergic:  Continuous observation after a severe reaction may be needed. Hospitalization is often required.  Wear a medical alert bracelet or necklace stating your allergy.  You and your family must learn how to use an anaphylaxis kit or give an epinephrine injection to temporarily treat an emergency allergic reaction. If you have had a severe reaction, always carry your epinephrine injection or anaphylaxis kit with you. This can be lifesaving if you have a severe reaction.  Do not drive or perform tasks after treatment until the medicines used to treat your reaction have worn off, or until your caregiver says it is okay. SEEK MEDICAL CARE IF:   You think you had an allergic reaction. Symptoms usually start within 30 minutes after exposure.  Symptoms are getting worse rather than better.  You develop new symptoms.  The symptoms that brought you to your caregiver return. SEEK IMMEDIATE MEDICAL CARE IF:   You have swelling of the mouth, difficulty breathing, or wheezing.  You have a tight feeling in your chest or throat.  You develop hives, swelling, or itching all over your body.  You develop severe vomiting or diarrhea.  You feel faint or pass out. This is an emergency. Use your epinephrine injection or anaphylaxis kit as you have been instructed. Call for emergency medical help. Even if you improve after the injection, you  need to be examined at a hospital emergency department. MAKE SURE YOU:   Understand these instructions.  Will watch your condition.  Will get help right away if you are not doing well or get worse. Document Released: 02/27/2005 Document Revised: 05/22/2011 Document Reviewed: 08/03/2010 [facility] Patient  Information 2014 [place], [place].

## 2013-06-19 LAB — HSV(HERPES SIMPLEX VRS) I + II AB-IGG
HSV 1 Glycoprotein G Ab, IgG: <0.1 IV
HSV 2 GLYCOPROTEIN G AB, IGG: 8.08 IV — AB

## 2013-06-19 LAB — GC/CHLAMYDIA PROBE AMP
CT PROBE, AMP APTIMA: NEGATIVE
GC Probe RNA: NEGATIVE

## 2013-06-20 ENCOUNTER — Ambulatory Visit (INDEPENDENT_AMBULATORY_CARE_PROVIDER_SITE_OTHER): Payer: 59 | Admitting: Emergency Medicine

## 2013-06-20 VITALS — BP 160/90 | HR 69 | Temp 98.2°F | Resp 18 | Ht 70.0 in | Wt 296.0 lb

## 2013-06-20 DIAGNOSIS — L739 Follicular disorder, unspecified: Secondary | ICD-10-CM

## 2013-06-20 DIAGNOSIS — B009 Herpesviral infection, unspecified: Secondary | ICD-10-CM

## 2013-06-20 DIAGNOSIS — L738 Other specified follicular disorders: Secondary | ICD-10-CM

## 2013-06-20 LAB — WOUND CULTURE
GRAM STAIN: NONE SEEN
GRAM STAIN: NONE SEEN
Gram Stain: NONE SEEN

## 2013-06-20 LAB — HERPES SIMPLEX VIRUS CULTURE: Organism ID, Bacteria: NOT DETECTED

## 2013-06-20 MED ORDER — MUPIROCIN 2 % EX OINT: 1.0000 "application " | TOPICAL_OINTMENT | Freq: Two times a day (BID) | CUTANEOUS | Status: DC

## 2013-06-20 MED ORDER — AMLODIPINE BESYLATE 5 MG PO TABS: 5.0000 mg | ORAL_TABLET | Freq: Every day | ORAL | Status: DC

## 2013-06-20 MED ORDER — VALACYCLOVIR HCL 1 G PO TABS: ORAL_TABLET | ORAL | Status: DC

## 2013-06-20 NOTE — Patient Instructions (Signed)
Folliculitis  Folliculitis is redness, soreness, and swelling (inflammation) of the hair follicles. This condition can occur anywhere on the body. People with weakened immune systems, diabetes, or obesity have a greater risk of getting folliculitis. CAUSES  Bacterial infection. This is the most common cause.  Fungal infection.  Viral infection.  Contact with certain chemicals, especially oils and tars. Long-term folliculitis can result from bacteria that live in the nostrils. The bacteria may trigger multiple outbreaks of folliculitis over time. SYMPTOMS Folliculitis most commonly occurs on the scalp, thighs, legs, back, buttocks, and areas where hair is shaved frequently. An early sign of folliculitis is a small, white or yellow, pus-filled, itchy lesion (pustule). These lesions appear on a red, inflamed follicle. They are usually less than 0.2 inches (5 mm) wide. When there is an infection of the follicle that goes deeper, it becomes a boil or furuncle. A group of closely packed boils creates a larger lesion (carbuncle). Carbuncles tend to occur in hairy, sweaty areas of the body. DIAGNOSIS  Your caregiver can usually tell what is wrong by doing a physical exam. A sample may be taken from one of the lesions and tested in a lab. This can help determine what is causing your folliculitis. TREATMENT  Treatment may include:  Applying warm compresses to the affected areas.  Taking antibiotic medicines orally or applying them to the skin.  Draining the lesions if they contain a large amount of pus or fluid.  Laser hair removal for cases of long-lasting folliculitis. This helps to prevent regrowth of the hair. HOME CARE INSTRUCTIONS  Apply warm compresses to the affected areas as directed by your caregiver.  If antibiotics are prescribed, take them as directed. Finish them even if you start to feel better.  You may take over-the-counter medicines to relieve itching.  Do not shave  irritated skin.  Follow up with your caregiver as directed. SEEK IMMEDIATE MEDICAL CARE IF:   You have increasing redness, swelling, or pain in the affected area.  You have a fever. MAKE SURE YOU:  Understand these instructions.  Will watch your condition.  Will get help right away if you are not doing well or get worse. Document Released: 05/08/2001 Document Revised: 08/29/2011 Document Reviewed: 05/30/2011 [facility] Patient Information 2014 [place], [place]. Genital Herpes Genital herpes is a sexually transmitted disease. This means that it is a disease passed by having sex with an infected person. There is no cure for genital herpes. The time between attacks can be months to years. The virus may live in a person but produce no problems (symptoms). This infection can be passed to a baby as it travels down the birth canal (vagina). In a newborn, this can cause central nervous system damage, eye damage, or even death. The virus that causes genital herpes is usually HSV-2 virus. The virus that causes oral herpes is usually HSV-1. The diagnosis (learning what is wrong) is made through culture results. SYMPTOMS  Usually symptoms of pain and itching begin a few days to a week after contact. It first appears as small blisters that progress to small painful ulcers which then scab over and heal after several days. It affects the outer genitalia, birth canal, cervix, penis, anal area, buttocks, and thighs. HOME CARE INSTRUCTIONS   Keep ulcerated areas dry and clean.  Take medications as directed. Antiviral medications can speed up healing. They will not prevent recurrences or cure this infection. These medications can also be taken for suppression if there are frequent recurrences.  While the infection is active, it is contagious. Avoid all sexual contact during active infections.  Condoms may help prevent spread of the herpes virus.  Practice safe sex.  Wash your hands thoroughly after  touching the genital area.  Avoid touching your eyes after touching your genital area.  Inform your caregiver if you have had genital herpes and become pregnant. It is your responsibility to insure a safe outcome for your baby in this pregnancy.  Only take over-the-counter or prescription medicines for pain, discomfort, or fever as directed by your caregiver. SEEK MEDICAL CARE IF:   You have a recurrence of this infection.  You do not respond to medications and are not improving.  You have new sources of pain or discharge which have changed from the original infection.  You have an oral temperature above 102 F (38.9 C).  You develop abdominal pain.  You develop eye pain or signs of eye infection. Document Released: 02/25/2000 Document Revised: 05/22/2011 Document Reviewed: 03/17/2009 [facility] Patient Information 2014 [place].

## 2013-06-20 NOTE — Progress Notes (Signed)
   Subjective:    Patient ID: Carl [name], male    DOB: 1965-08-05, 48 y.o.   MRN: [id number]  HPI This chart was scribed for Carl [name], by Ladona Ridgelaylor Day, Scribe. This patient was seen in room 1 and the patient's care was started at 10:46 AM.  HPI Comments: Carl [name] is a 48 y.o. male who presents to the [facility] for follow up of a rash and swelling in and around throat area as well as lesion of his lips. He was seen here x2 days ago for this problem which has markedly improved. He was started on valtrex and doxycycline. He also applied neosporin. His sx have been improving. He denies any new sx. His sx originally began 05/17/13.  Past Medical History  Diagnosis Date  . Diabetes mellitus without complication   . Hypertension     Allergies  Allergen Reactions  . Amoxil [Amoxicillin] Swelling and Rash    No orders of the defined types were placed in this encounter.   Review of Systems  Constitutional: Negative for fever and chills.  Respiratory: Negative for cough and shortness of breath.   Cardiovascular: Negative for chest pain.  Gastrointestinal: Negative for abdominal pain.  Musculoskeletal: Negative for back pain.  Skin: Positive for rash.       Objective:   Physical Exam Physical Exam  Nursing note and vitals reviewed. Constitutional: Patient is oriented to person, place, and time. Patient appears well-developed and well-nourished. No distress.  HENT:  Head: Normocephalic and atraumatic.  Neck: Neck supple. No tracheal deviation present.  Cardiovascular: Normal rate, regular rhythm and normal heart sounds.   No murmur heard. Pulmonary/Chest: Effort normal and breath sounds normal. No respiratory distress. Patient has no wheezes. Patient has no rales.  Musculoskeletal: Normal range of motion.  Neurological: Patient is alert and oriented to person, place, and time.  Skin: Skin is warm and dry.  Psychiatric: Patient has a normal mood and affect.  Patient's behavior is normal.   Triage Vitals: . BP 160/90  Pulse 69  Temp(Src) 98.2 F (36.8 C)  Resp 18  Ht 5\' 10"  (1.778 m)  Wt 296 lb (134.265 kg)  BMI 42.47 kg/m2  SpO2 98% Meds ordered this encounter  Medications  . amLODipine (NORVASC) 5 MG tablet    Sig: Take 1 tablet (5 mg total) by mouth daily.    Dispense:  30 tablet    Refill:  11  . mupirocin ointment (BACTROBAN) 2 %    Sig: Apply 1 application topically 2 (two) times daily.    Dispense:  22 g    Refill:  3  . valACYclovir (VALTREX) 1000 MG tablet    Sig: Take one half tablet daily    Dispense:  20 tablet    Refill:  11      Assessment & Plan:  DIAGNOSTIC STUDIES: Oxygen Saturation is 98% on room air, normal by my interpretation.    COORDINATION OF CARE: At 1045 AM Discussed treatment plan with patient. Patient agrees. I started him on Norvasc 5 one a day for blood pressure control. 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 He was given a prescription for Bactroban to use on his lips.   I personally performed the services described in this documentation, which was scribed in my presence. The recorded information has been reviewed and is accurate.

## 2013-07-04 ENCOUNTER — Other Ambulatory Visit: Payer: Self-pay | Admitting: Emergency Medicine

## 2013-08-22 ENCOUNTER — Ambulatory Visit (INDEPENDENT_AMBULATORY_CARE_PROVIDER_SITE_OTHER): Payer: 59 | Admitting: Internal Medicine

## 2013-08-22 VITALS — BP 118/70 | HR 85 | Temp 98.7°F | Resp 18 | Ht 69.0 in | Wt 286.4 lb

## 2013-08-22 DIAGNOSIS — K1370 Unspecified lesions of oral mucosa: Secondary | ICD-10-CM

## 2013-08-22 DIAGNOSIS — K047 Periapical abscess without sinus: Secondary | ICD-10-CM

## 2013-08-22 DIAGNOSIS — K1379 Other lesions of oral mucosa: Secondary | ICD-10-CM

## 2013-08-22 MED ORDER — CLINDAMYCIN HCL 300 MG PO CAPS: 300.0000 mg | ORAL_CAPSULE | Freq: Three times a day (TID) | ORAL | Status: DC

## 2013-08-22 NOTE — Progress Notes (Signed)
This chart was scribed for Carl Siaobert Doolittle, MD by Carl [name], ED Scribe. This patient was seen in room Room/bed 11 and the patient's care was started at 8:18 AM. Subjective:    Patient ID: Carl [name], male    DOB: 07-17-1965, 48 y.o.   MRN: [id number] Chief Complaint  Patient presents with   gum abcess in mouth x 2 days   HPI HPI Comments: Carl [name] is a 48 y.o. male with a hx of DM Type II and HTN who [facility] of an oral abscess to the L upper incisor that presented 2 days ago. Pt states he has a hx of abscesses. He denies having a throbbing sensation or being woken up in his sleep. He denies fever, chills, drainage from abscess, and trouble swallowing.   Patient Active Problem List   Diagnosis Date Noted   BMI 40.0-44.9, adult 06/12/2011   Diabetes type 2, controlled 06/12/2011   Hyperlipidemia 06/12/2011   Hypertension 06/12/2011   Current outpatient prescriptions:amLODipine (NORVASC) 5 MG tablet, Take 1 tablet (5 mg total) by mouth daily., Disp: 30 tablet, Rfl: 11;  doxycycline (VIBRA-TABS) 100 MG tablet, Take 1 tablet (100 mg total) by mouth 2 (two) times daily., Disp: 20 tablet, Rfl: 0;  HYDROcodone-acetaminophen (NORCO) 5-325 MG per tablet, One every 4-6 hours for severe pain only., Disp: 20 tablet, Rfl: 0 metFORMIN (GLUCOPHAGE) 1000 MG tablet, Take 1 tablet (1,000 mg total) by mouth 2 (two) times daily with a meal., Disp: 180 tablet, Rfl: 3;  mupirocin ointment (BACTROBAN) 2 %, Apply 1 application topically 2 (two) times daily., Disp: 22 g, Rfl: 3;  pravastatin (PRAVACHOL) 40 MG tablet, Take 1 tablet (40 mg total) by mouth daily., Disp: 90 tablet, Rfl: 3;  valACYclovir (VALTREX) 1000 MG tablet, Take one half tablet daily, Disp: 20 tablet, Rfl: 11 valACYclovir (VALTREX) 1000 MG tablet, TAKE ONE TABLET BY MOUTH TWICE DAILY, Disp: 20 tablet, Rfl: 0  Review of Systems  Constitutional: Negative for fever and chills.  HENT: Negative for trouble  swallowing.   Skin: Positive for color change.   Objective:   Physical Exam  Nursing note and vitals reviewed. Constitutional: He is oriented to person, place, and time. He appears well-developed and well-nourished. No distress.  HENT:  Head: Normocephalic and atraumatic.  Right Ear: External ear normal.  Left Ear: External ear normal.  Abscess above the L upper incisor.  Eyes: Conjunctivae are normal. Pupils are equal, round, and reactive to light.  Neck: Normal range of motion. Neck supple.  Cardiovascular: Normal rate.   No murmur heard. Pulmonary/Chest: Effort normal.  Abdominal: Bowel sounds are normal.  Musculoskeletal: Normal range of motion.  Neurological: He is alert and oriented to person, place, and time. He has normal reflexes.  Skin: Skin is warm and dry. He is not diaphoretic.  Psychiatric: He has a normal mood and affect. His behavior is normal.   Triage Vitals:BP 118/70   Pulse 85   Temp(Src) 98.7 F (37.1 C) (Oral)   Resp 18   Ht 5\' 9"  (1.753 m)   Wt 286 lb 6.4 oz (129.91 kg)   BMI 42.27 kg/m2   SpO2 96% Assessment & Plan:    I have completed the patient encounter in its entirety as documented by the scribe, with editing by me where necessary. Robert P. Merla Richesoolittle, M.D. Mouth pain  Abscess of mouth  Meds ordered this encounter  Medications   clindamycin (CLEOCIN) 300 MG capsule    Sig: Take 1  capsule (300 mg total) by mouth 3 (three) times daily.    Dispense:  30 capsule    Refill:  0   Disc last labs

## 2014-01-16 ENCOUNTER — Ambulatory Visit (INDEPENDENT_AMBULATORY_CARE_PROVIDER_SITE_OTHER): Payer: 59 | Admitting: Internal Medicine

## 2014-01-16 VITALS — BP 152/80 | HR 75 | Temp 97.4°F | Resp 18 | Ht 70.5 in | Wt 282.0 lb

## 2014-01-16 DIAGNOSIS — A084 Viral intestinal infection, unspecified: Secondary | ICD-10-CM

## 2014-01-16 NOTE — Progress Notes (Signed)
   Subjective:    Patient ID: Carl [name], male    DOB: 07/27/1965, 48 y.o.   MRN: [id number]  HPI  Patient is a 48 year old male coming to be seen for diarrhea. Patient received his flu vaccine at work in October. He had diarrhea since Saturday. He had 4-5 stool on diarrhea since Saturday. He can still eat and drink as normal. He denies having no fever or chills. No nausea and vomiting. No cramping or bloating. No dairy product until today. No blood or mucous in the stool. Patient states stool looks clear. No dizzy or fatigue.  Patient has diabetes, hypertension, and hyperlipemia. He does not want any testing this visit.  Review of Systems     Objective:   Physical Exam  Constitutional: He is oriented to person, place, and time. He appears well-nourished. No distress.  HENT:  Head: Normocephalic.  Mouth/Throat: Oropharynx is clear and moist.  Eyes: Conjunctivae and EOM are normal. Pupils are equal, round, and reactive to light. No scleral icterus.  Neck: Normal range of motion.  Cardiovascular: Normal rate, regular rhythm and normal heart sounds.   Pulmonary/Chest: Effort normal and breath sounds normal.  Abdominal: Bowel sounds are normal. He exhibits no distension and no mass. There is no tenderness.  Neurological: He is alert and oriented to person, place, and time. He exhibits normal muscle tone. Coordination normal.  Psychiatric: He has a normal mood and affect. His behavior is normal. Judgment and thought content normal.  Vitals reviewed.   Viral Gastroenteritis      Assessment & Plan:  Agrees to establish with PCP at [address] Brat diet/Peptobismol

## 2014-01-16 NOTE — Patient Instructions (Signed)

## 2014-04-07 ENCOUNTER — Ambulatory Visit (INDEPENDENT_AMBULATORY_CARE_PROVIDER_SITE_OTHER): Payer: 59 | Admitting: Physician Assistant

## 2014-04-07 VITALS — BP 100/64 | HR 70 | Temp 97.9°F | Resp 16 | Ht 71.0 in | Wt 282.0 lb

## 2014-04-07 DIAGNOSIS — J3489 Other specified disorders of nose and nasal sinuses: Secondary | ICD-10-CM

## 2014-04-07 DIAGNOSIS — R0981 Nasal congestion: Secondary | ICD-10-CM

## 2014-04-07 DIAGNOSIS — J069 Acute upper respiratory infection, unspecified: Secondary | ICD-10-CM

## 2014-04-07 MED ORDER — GUAIFENESIN ER 1200 MG PO TB12: 1.0000 | ORAL_TABLET | Freq: Two times a day (BID) | ORAL | Status: DC | PRN

## 2014-04-07 MED ORDER — IPRATROPIUM BROMIDE 0.03 % NA SOLN: 2.0000 | Freq: Two times a day (BID) | NASAL | Status: DC

## 2014-04-07 NOTE — Patient Instructions (Signed)
Please contact us Friday AM, early afternoon, if you are feeling no improvement.  Please continue to drink water (64oz/day).    Upper Respiratory Infection, Adult An upper respiratory infection (URI) is also sometimes known as the common cold. The upper respiratory tract includes the nose, sinuses, throat, trachea, and bronchi. Bronchi are the airways leading to the lungs. Most people improve within 1 week, but symptoms can last up to 2 weeks. A residual cough may last even longer.  CAUSES Many different viruses can infect the tissues lining the upper respiratory tract. The tissues become irritated and inflamed and often become very moist. Mucus production is also common. A cold is contagious. You can easily spread the virus to others by oral contact. This includes kissing, sharing a glass, coughing, or sneezing. Touching your mouth or nose and then touching a surface, which is then touched by another person, can also spread the virus. SYMPTOMS  Symptoms typically develop 1 to 3 days after you come in contact with a cold virus. Symptoms vary from person to person. They may include:  Runny nose.  Sneezing.  Nasal congestion.  Sinus irritation.  Sore throat.  Loss of voice (laryngitis).  Cough.  Fatigue.  Muscle aches.  Loss of appetite.  Headache.  Low-grade fever. DIAGNOSIS  You might diagnose your own cold based on familiar symptoms, since most people get a cold 2 to 3 times a year. Your caregiver can confirm this based on your exam. Most importantly, your caregiver can check that your symptoms are not due to another disease such as strep throat, sinusitis, pneumonia, asthma, or epiglottitis. Blood tests, throat tests, and X-rays are not necessary to diagnose a common cold, but they may sometimes be helpful in excluding other more serious diseases. Your caregiver will decide if any further tests are required. RISKS AND COMPLICATIONS  You may be at risk for a more severe case of  the common cold if you smoke cigarettes, have chronic heart disease (such as heart failure) or lung disease (such as asthma), or if you have a weakened immune system. The very young and very old are also at risk for more serious infections. Bacterial sinusitis, middle ear infections, and bacterial pneumonia can complicate the common cold. The common cold can worsen asthma and chronic obstructive pulmonary disease (COPD). Sometimes, these complications can require emergency medical care and may be life-threatening. PREVENTION  The best way to protect against getting a cold is to practice good hygiene. Avoid oral or hand contact with people with cold symptoms. Wash your hands often if contact occurs. There is no clear evidence that vitamin C, vitamin E, echinacea, or exercise reduces the chance of developing a cold. However, it is always recommended to get plenty of rest and practice good nutrition. TREATMENT  Treatment is directed at relieving symptoms. There is no cure. Antibiotics are not effective, because the infection is caused by a virus, not by bacteria. Treatment may include:  Increased fluid intake. Sports drinks offer valuable electrolytes, sugars, and fluids.  Breathing heated mist or steam (vaporizer or shower).  Eating chicken soup or other clear broths, and maintaining good nutrition.  Getting plenty of rest.  Using gargles or lozenges for comfort.  Controlling fevers with ibuprofen or acetaminophen as directed by your caregiver.  Increasing usage of your inhaler if you have asthma. Zinc gel and zinc lozenges, taken in the first 24 hours of the common cold, can shorten the duration and lessen the severity of symptoms. Pain medicines may  help with fever, muscle aches, and throat pain. A variety of non-prescription medicines are available to treat congestion and runny nose. Your caregiver can make recommendations and may suggest nasal or lung inhalers for other symptoms.  HOME CARE  INSTRUCTIONS   Only take over-the-counter or prescription medicines for pain, discomfort, or fever as directed by your caregiver.  Use a warm mist humidifier or inhale steam from a shower to increase air moisture. This may keep secretions moist and make it easier to breathe.  Drink enough water and fluids to keep your urine clear or pale yellow.  Rest as needed.  Return to work when your temperature has returned to normal or as your caregiver advises. You may need to stay home longer to avoid infecting others. You can also use a face mask and careful hand washing to prevent spread of the virus. SEEK MEDICAL CARE IF:   After the first few days, you feel you are getting worse rather than better.  You need your caregiver's advice about medicines to control symptoms.  You develop chills, worsening shortness of breath, or brown or red sputum. These may be signs of pneumonia.  You develop yellow or brown nasal discharge or pain in the face, especially when you bend forward. These may be signs of sinusitis.  You develop a fever, swollen neck glands, pain with swallowing, or white areas in the back of your throat. These may be signs of strep throat. SEEK IMMEDIATE MEDICAL CARE IF:   You have a fever.  You develop severe or persistent headache, ear pain, sinus pain, or chest pain.  You develop wheezing, a prolonged cough, cough up blood, or have a change in your usual mucus (if you have chronic lung disease).  You develop sore muscles or a stiff neck. Document Released: 08/23/2000 Document Revised: 05/22/2011 Document Reviewed: 06/04/2013 [facility] Patient Information 2015 [place], [place]. This information is not intended to replace advice given to you by your health care provider. Make sure you discuss any questions you have with your health care provider.  Please return to the clinic if you have fever or dizziness. And please get a primary care to care for your overall health.

## 2014-04-07 NOTE — Progress Notes (Signed)
    MRN: [id number] DOB: 23-Jun-1965  Subjective:   Carl [name] is a 49 y.o. male non-smoker presenting for nasal congestion and sinus pressure.  He states this started 2 days ago with nasal congestion and headache.  The headache is at his frontal sinus.  He expresses no rhinorrhea, but the mucus is greenish when blown.  Throat today feels ticklish and he has noticed a new onset of cough. He has no fever or body aches.  He had diarrhea yesterday, but states that this has resolved.  He also had a seafood dinner prior to this 1 episode.  He has taken his anti-hypertensives, and states that he has no chest pains, palpitations, or leg swellings.  He is compliant on meds.      Carl [name] has a current medication list which includes the following prescription(s): amlodipine, clindamycin, doxycycline, hydrocodone-acetaminophen, metformin, pravastatin, mupirocin ointment, valacyclovir, and valacyclovir.  He has No Known Allergies.  Carl [name]  has a past medical history of Diabetes mellitus without complication and Hypertension. Also  has past surgical history that includes Vasectomy and Hand surgery.  ROS As in subjective.  Objective:   Vitals: BP 146/82 mmHg  Pulse 70  Temp(Src) 97.9 F (36.6 C)  Resp 16  Ht 5\' 11"  (1.803 m)  Wt 282 lb (127.914 kg)  BMI 39.35 kg/m2  SpO2 97%  Physical Exam  Constitutional: He is oriented to person, place, and time and well-developed, well-nourished, and in no distress. No distress.  HENT:  Head: Normocephalic and atraumatic.  Right Ear: Tympanic membrane, external ear and ear canal normal.  Left Ear: Tympanic membrane, external ear and ear canal normal.  Nose: Mucosal edema and rhinorrhea present. Right sinus exhibits no maxillary sinus tenderness and no frontal sinus tenderness. Left sinus exhibits no maxillary sinus tenderness and no frontal sinus tenderness.  Eyes: Conjunctivae are normal. Pupils are equal, round, and reactive to light. Right eye exhibits no  discharge. Left eye exhibits no discharge.  Cardiovascular: Normal rate, regular rhythm and normal heart sounds.  Exam reveals no gallop and no friction rub.   No murmur heard. Pulmonary/Chest: Effort normal and breath sounds normal. No respiratory distress. He has no wheezes.  Lymphadenopathy:       Head (right side): No submandibular, no tonsillar, no preauricular, no posterior auricular and no occipital adenopathy present.       Head (left side): No submandibular, no tonsillar, no preauricular, no posterior auricular and no occipital adenopathy present.    He has no cervical adenopathy.       Right: No supraclavicular adenopathy present.       Left: No supraclavicular adenopathy present.  Neurological: He is alert and oriented to person, place, and time.  Skin: Skin is warm and dry.  Psychiatric: Mood and affect normal.    Assessment and Plan :  49 year old male with PMH listed above, is here for chief complaint of sinus pressure, nasal congestion and headache.  Most suspicious of viral etiology.  Will treat supportively.  Declines cough medication at this time, but will contact if he needs this.    Upper respiratory infection, viral  Nasal congestion -ipratropium (ATROVENT) 0.03 % nasal spray,  -Guaifenesin (MUCINEX MAXIMUM STRENGTH) 1200 MG TB12 -Advised hydration, appropriate dietary intake  Sinus pressure  Trena PlattStephanie English, PA-C [facility] 1/26/201610:15 AM

## 2014-05-18 ENCOUNTER — Ambulatory Visit (INDEPENDENT_AMBULATORY_CARE_PROVIDER_SITE_OTHER): Payer: 59 | Admitting: Emergency Medicine

## 2014-05-18 VITALS — BP 120/78 | HR 75 | Temp 98.0°F | Resp 17 | Ht 70.5 in | Wt 278.0 lb

## 2014-05-18 DIAGNOSIS — N492 Inflammatory disorders of scrotum: Secondary | ICD-10-CM

## 2014-05-18 DIAGNOSIS — E118 Type 2 diabetes mellitus with unspecified complications: Secondary | ICD-10-CM | POA: Diagnosis not present

## 2014-05-18 LAB — POCT CBC
Granulocyte percent: 76.8 %G (ref 37–80)
HCT, POC: 47.3 % (ref 43.5–53.7)
Hemoglobin: 15.6 g/dL (ref 14.1–18.1)
Lymph, poc: 2.4 (ref 0.6–3.4)
MCH: 29 pg (ref 27–31.2)
MCHC: 33 g/dL (ref 31.8–35.4)
MCV: 88 fL (ref 80–97)
MID (cbc): 0.5 (ref 0–0.9)
MPV: 8.9 fL (ref 0–99.8)
PLATELET COUNT, POC: 200 10*3/uL (ref 142–424)
POC GRANULOCYTE: 9.4 — AB (ref 2–6.9)
POC LYMPH PERCENT: 19.3 %L (ref 10–50)
POC MID %: 3.9 % (ref 0–12)
RBC: 5.38 M/uL (ref 4.69–6.13)
RDW, POC: 14 %
WBC: 12.3 10*3/uL — AB (ref 4.6–10.2)

## 2014-05-18 LAB — GLUCOSE, POCT (MANUAL RESULT ENTRY): POC Glucose: 332 mg/dl — AB (ref 70–99)

## 2014-05-18 MED ORDER — CLINDAMYCIN HCL 300 MG PO CAPS: 300.0000 mg | ORAL_CAPSULE | Freq: Three times a day (TID) | ORAL | Status: DC

## 2014-05-18 NOTE — Progress Notes (Addendum)
Subjective:  This chart was scribed for Carl ChrisSteven Arlett Goold, MD by Elveria Risingimelie Horne, Medial Scribe. This patient was seen in room 11 and the patient's care was started at 8:30 AM.    Patient ID: Carl [name], male    DOB: 1965-05-23, 49 y.o.   MRN: [id number] Chief Complaint  Patient presents with  . Groin Pain    bump on groin     HPI HPI Comments: Carl LoganGary W Venson is a 49 y.o. male with PMHx of Diabetes Type II, Hyperlipidemia, and Hypertension who presents to the [facility] complaining of a testicular infection. Onset of symptoms was Saturday morning. He has pain and discomfort with a masslike area at the base of the scrotum. He was able to express some purulent material yesterday evening. He has a history of MRSA years ago.  Patient Active Problem List   Diagnosis Date Noted  . BMI 40.0-44.9, adult 06/12/2011  . Diabetes type 2, controlled 06/12/2011  . Hyperlipidemia 06/12/2011  . Hypertension 06/12/2011   Past Medical History  Diagnosis Date  . Diabetes mellitus without complication   . Hypertension    Past Surgical History  Procedure Laterality Date  . Vasectomy    . Hand surgery     No Known Allergies Prior to Admission medications   Medication Sig Start Date End Date Taking? Authorizing Provider  metFORMIN (GLUCOPHAGE) 1000 MG tablet Take 1 tablet (1,000 mg total) by mouth 2 (two) times daily with a meal. 03/21/13  Yes Jonita Albeehris W Guest, MD  pravastatin (PRAVACHOL) 40 MG tablet Take 1 tablet (40 mg total) by mouth daily. 03/21/13  Yes Jonita Albeehris W Guest, MD   History   Social History  . Marital Status: Single    Spouse Name: N/A  . Number of Children: N/A  . Years of Education: N/A   Occupational History  . Not on file.   Social History Main Topics  . Smoking status: Former Smoker -- 0.50 packs/day for 2 years    Quit date: 10/05/1984  . Smokeless tobacco: Not on file  . Alcohol Use: Yes  . Drug Use: No  . Sexual Activity: Yes   Other Topics Concern  . Not  on file   Social History Narrative      Review of Systems  Constitutional: Negative for fever and chills.  Genitourinary: Positive for testicular pain.       Objective:   Physical Exam  Vitals reviewed.  CONSTITUTIONAL: Well developed/well nourished HEAD: Normocephalic/atraumatic EYES: EOMI/PERRL ENMT: Mucous membranes moist NECK: supple no meningeal signs SPINE/BACK:entire spine nontender CV: S1/S2 noted, no murmurs/rubs/gallops noted LUNGS: Lungs are clear to auscultation bilaterally, no apparent distress ABDOMEN: soft, nontender, no rebound or guarding, bowel sounds noted throughout abdomen GU:no cva tenderness NEURO: Pt is awake/alert/appropriate, moves all extremitiesx4.  No facial droop.   EXTREMITIES: pulses normal/equal, full ROM SKIN: warm, color normal PSYCH: no abnormalities of mood noted, alert and oriented to situation GENITALS there is a coughed all size firm masslike area at the base of the scrotum on the right. There is a small scab-like area over this mass. Testicles themselves are normal. Filed Vitals:   05/18/14 0808  BP: 120/78  Pulse: 75  Temp: 98 F (36.7 C)  TempSrc: Oral  Resp: 17  Height: 5' 10.5" (1.791 m)  Weight: 278 lb (126.1 kg)  SpO2: 96%   Results for orders placed or performed in visit on 05/18/14  POCT CBC  Result Value Ref Range   WBC  12.3 (A) 4.6 - 10.2 K/uL   Lymph, poc 2.4 0.6 - 3.4   POC LYMPH PERCENT 19.3 10 - 50 %L   MID (cbc) 0.5 0 - 0.9   POC MID % 3.9 0 - 12 %M   POC Granulocyte 9.4 (A) 2 - 6.9   Granulocyte percent 76.8 37 - 80 %G   RBC 5.38 4.69 - 6.13 M/uL   Hemoglobin 15.6 14.1 - 18.1 g/dL   HCT, POC 16.1 09.6 - 53.7 %   MCV 88.0 80 - 97 fL   MCH, POC 29.0 27 - 31.2 pg   MCHC 33.0 31.8 - 35.4 g/dL   RDW, POC 04.5 %   Platelet Count, POC 200 142 - 424 K/uL   MPV 8.9 0 - 99.8 fL  POCT glucose (manual entry)  Result Value Ref Range   POC Glucose 332 (A) 70 - 99 mg/dl        Assessment & Plan:  Patient  placed on Cleocin 300 g. He will take his medications 4 times a day. Call placed to urology to see about an emergent appointment for ultrasound and possible I&D. He is placed out of work this week. We'll check a baseline sugar and white count. His sugar is not under control at 332. White count is up to 12,300. He was instructed to start on the Cleocin and do warm compresses until his appointment with urology this afternoon.I personally performed the services described in this documentation, which was scribed in my presence. The recorded information has been reviewed and is accurate.

## 2014-05-18 NOTE — Patient Instructions (Signed)
[facility] will see you at 2:30pm today. Make sure to be there by 2:00pm. You will be seeing Dr. Mena GoesEskridge.   [facility] [address] [place] [facility] [place], [place] [id number]   Phone:[phone]

## 2014-05-22 ENCOUNTER — Telehealth: Payer: Self-pay

## 2014-05-22 ENCOUNTER — Other Ambulatory Visit: Payer: Self-pay | Admitting: Internal Medicine

## 2014-05-22 NOTE — Telephone Encounter (Signed)
Pt called requesting a refill of lisinopril.

## 2014-05-22 NOTE — Telephone Encounter (Signed)
Sent in RF. 

## 2014-05-22 NOTE — Telephone Encounter (Signed)
Byrd HesselbachMaria from [facility] is calling on behalf of patient to request [facility]ill [facility] [address]

## 2014-05-22 NOTE — Telephone Encounter (Signed)
Summary: TAKE ONE TABLET BY MOUTH ONCE DAILY, Normal Start: 05/22/2014  Ord/Sold: 05/22/2014 (O)  Report  Taking:  Long-term:   Pharmacy: [id number] [place] - [address]  Med Dose History  ChangeDiscontinue   Patient Sig: TAKE ONE TABLET BY MOUTH ONCE DAILY   Ordered on: 05/22/2014   Authorized by: Lesle ChrisAUB, STEVEN A   Dispense: 90 tablet   Lisinopril was sent in today. Left message on voicemail letting pt know.

## 2014-06-16 ENCOUNTER — Telehealth: Payer: Self-pay | Admitting: Family Medicine

## 2014-06-16 ENCOUNTER — Ambulatory Visit (INDEPENDENT_AMBULATORY_CARE_PROVIDER_SITE_OTHER): Payer: 59 | Admitting: Urgent Care

## 2014-06-16 VITALS — BP 110/80 | HR 60 | Temp 97.5°F | Resp 16 | Ht 69.5 in | Wt 279.0 lb

## 2014-06-16 DIAGNOSIS — E782 Mixed hyperlipidemia: Secondary | ICD-10-CM

## 2014-06-16 DIAGNOSIS — IMO0002 Reserved for concepts with insufficient information to code with codable children: Secondary | ICD-10-CM

## 2014-06-16 DIAGNOSIS — L02214 Cutaneous abscess of groin: Secondary | ICD-10-CM | POA: Diagnosis not present

## 2014-06-16 DIAGNOSIS — E1165 Type 2 diabetes mellitus with hyperglycemia: Secondary | ICD-10-CM

## 2014-06-16 LAB — LIPID PANEL
Cholesterol: 180 mg/dL (ref 0–200)
HDL: 29 mg/dL — ABNORMAL LOW (ref >=40)
LDL CALC: 106 mg/dL — AB (ref 0–99)
TRIGLYCERIDES: 227 mg/dL — AB (ref <150)
Total CHOL/HDL Ratio: 6.2 Ratio
VLDL: 45 mg/dL — ABNORMAL HIGH (ref 0–40)

## 2014-06-16 LAB — COMPLETE METABOLIC PANEL WITH GFR
ALBUMIN: 4.2 g/dL (ref 3.5–5.2)
ALK PHOS: 58 U/L (ref 39–117)
ALT: 37 U/L (ref 0–53)
AST: 20 U/L (ref 0–37)
BUN: 14 mg/dL (ref 6–23)
CO2: 23 mEq/L (ref 19–32)
Calcium: 8.6 mg/dL (ref 8.4–10.5)
Chloride: 102 mEq/L (ref 96–112)
Creat: 0.49 mg/dL — ABNORMAL LOW (ref 0.50–1.35)
Glucose, Bld: 311 mg/dL — ABNORMAL HIGH (ref 70–99)
Potassium: 3.9 mEq/L (ref 3.5–5.3)
Sodium: 136 mEq/L (ref 135–145)
Total Bilirubin: 0.6 mg/dL (ref 0.2–1.2)
Total Protein: 6.6 g/dL (ref 6.0–8.3)

## 2014-06-16 LAB — POCT GLYCOSYLATED HEMOGLOBIN (HGB A1C): HEMOGLOBIN A1C: 11.4

## 2014-06-16 MED ORDER — BLOOD GLUCOSE MONITOR KIT: PACK | Status: DC

## 2014-06-16 MED ORDER — DOXYCYCLINE HYCLATE 100 MG PO CAPS: 100.0000 mg | ORAL_CAPSULE | Freq: Two times a day (BID) | ORAL | Status: AC

## 2014-06-16 MED ORDER — CANAGLIFLOZIN 100 MG PO TABS: 100.0000 mg | ORAL_TABLET | Freq: Every day | ORAL | Status: DC

## 2014-06-16 MED ORDER — PRAVASTATIN SODIUM 40 MG PO TABS: 40.0000 mg | ORAL_TABLET | Freq: Every day | ORAL | Status: DC

## 2014-06-16 NOTE — Patient Instructions (Addendum)
Abscess - Please change your dressing daily for at least 1 week. If you have increased redness, pain, fevers, drainage of excessive amounts of pus or blood, return to our clinic.  Diabetes - Please start checking your blood sugar before eating in the morning and at night. Record these readings and return to our clinic for follow up in 2 months. At that time we can refill your lisinopril. - Our office will contact you with appointment time for diabetic nutritional counseling.  Type 2 Diabetes Mellitus Type 2 diabetes mellitus, often simply referred to as type 2 diabetes, is a long-lasting (chronic) disease. In type 2 diabetes, the pancreas does not make enough insulin (a hormone), the cells are less responsive to the insulin that is made (insulin resistance), or both. Normally, insulin moves sugars from food into the tissue cells. The tissue cells use the sugars for energy. The lack of insulin or the lack of normal response to insulin causes excess sugars to build up in the blood instead of going into the tissue cells. As a result, high blood sugar (hyperglycemia) develops. The effect of high sugar (glucose) levels can cause many complications. Type 2 diabetes was also previously called adult-onset diabetes, but it can occur at any age.  RISK FACTORS  A person is predisposed to developing type 2 diabetes if someone in the family has the disease and also has one or more of the following primary risk factors:  Overweight.  An inactive lifestyle.  A history of consistently eating high-calorie foods. Maintaining a normal weight and regular physical activity can reduce the chance of developing type 2 diabetes. SYMPTOMS  A person with type 2 diabetes may not show symptoms initially. The symptoms of type 2 diabetes appear slowly. The symptoms include:  Increased thirst (polydipsia).  Increased urination (polyuria).  Increased urination during the night (nocturia).  Weight loss. This weight  loss may be rapid.  Frequent, recurring infections.  Tiredness (fatigue).  Weakness.  Vision changes, such as blurred vision.  Fruity smell to your breath.  Abdominal pain.  Nausea or vomiting.  Cuts or bruises which are slow to heal.  Tingling or numbness in the hands or feet. DIAGNOSIS Type 2 diabetes is frequently not diagnosed until complications of diabetes are present. Type 2 diabetes is diagnosed when symptoms or complications are present and when blood glucose levels are increased. Your blood glucose level may be checked by one or more of the following blood tests:  A fasting blood glucose test. You will not be allowed to eat for at least 8 hours before a blood sample is taken.  A random blood glucose test. Your blood glucose is checked at any time of the day regardless of when you ate.  A hemoglobin A1c blood glucose test. A hemoglobin A1c test provides information about blood glucose control over the previous 3 months.  An oral glucose tolerance test (OGTT). Your blood glucose is measured after you have not eaten (fasted) for 2 hours and then after you drink a glucose-containing beverage. TREATMENT   You may need to take insulin or diabetes medicine daily to keep blood glucose levels in the desired range.  If you use insulin, you may need to adjust the dosage depending on the carbohydrates that you eat with each meal or snack. The treatment goal is to maintain the before meal blood sugar (preprandial glucose) level at 70-130 mg/dL. HOME CARE INSTRUCTIONS   Have your hemoglobin A1c level checked twice a year.  Perform daily blood  glucose monitoring as directed by your health care provider.  Monitor urine ketones when you are ill and as directed by your health care provider.  Take your diabetes medicine or insulin as directed by your health care provider to maintain your blood glucose levels in the desired range.  Never run out of diabetes medicine or insulin. It  is needed every day.  If you are using insulin, you may need to adjust the amount of insulin given based on your intake of carbohydrates. Carbohydrates can raise blood glucose levels but need to be included in your diet. Carbohydrates provide vitamins, minerals, and fiber which are an essential part of a healthy diet. Carbohydrates are found in fruits, vegetables, whole grains, dairy products, legumes, and foods containing added sugars.  Eat healthy foods. You should make an appointment to see a registered dietitian to help you create an eating plan that is right for you.  Lose weight if you are overweight.  Carry a medical alert card or wear your medical alert jewelry.  Carry a 15-gram carbohydrate snack with you at all times to treat low blood glucose (hypoglycemia). Some examples of 15-gram carbohydrate snacks include:  Glucose tablets, 3 or 4.  Glucose gel, 15-gram tube.  Raisins, 2 tablespoons (24 grams).  Jelly beans, 6.  Animal crackers, 8.  Regular pop, 4 ounces (120 mL).  Gummy treats, 9.  Recognize hypoglycemia. Hypoglycemia occurs with blood glucose levels of 70 mg/dL and below. The risk for hypoglycemia increases when fasting or skipping meals, during or after intense exercise, and during sleep. Hypoglycemia symptoms can include:  Tremors or shakes.  Decreased ability to concentrate.  Sweating.  Increased heart rate.  Headache.  Dry mouth.  Hunger.  Irritability.  Anxiety.  Restless sleep.  Altered speech or coordination.  Confusion.  Treat hypoglycemia promptly. If you are alert and able to safely swallow, follow the 15:15 rule:  Take 15-20 grams of rapid-acting glucose or carbohydrate. Rapid-acting options include glucose gel, glucose tablets, or 4 ounces (120 mL) of fruit juice, regular soda, or low-fat milk.  Check your blood glucose level 15 minutes after taking the glucose.  Take 15-20 grams more of glucose if the repeat blood glucose level  is still 70 mg/dL or below.  Eat a meal or snack within 1 hour once blood glucose levels return to normal.  Be alert to feeling very thirsty and urinating more frequently than usual, which are early signs of hyperglycemia. An early awareness of hyperglycemia allows for prompt treatment. Treat hyperglycemia as directed by your health care provider.  Engage in at least 150 minutes of moderate-intensity physical activity a week, spread over at least 3 days of the week or as directed by your health care provider. In addition, you should engage in resistance exercise at least 2 times a week or as directed by your health care provider. Try to spend no more than 90 minutes at one time inactive.  Adjust your medicine and food intake as needed if you start a new exercise or sport.  Follow your sick-day plan anytime you are unable to eat or drink as usual.  Do not use any tobacco products including cigarettes, chewing tobacco, or electronic cigarettes. If you need help quitting, ask your health care provider.  Limit alcohol intake to no more than 1 drink per day for nonpregnant women and 2 drinks per day for men. You should drink alcohol only when you are also eating food. Talk with your health care provider whether alcohol  is safe for you. Tell your health care provider if you drink alcohol several times a week.  Keep all follow-up visits as directed by your health care provider. This is important.  Schedule an eye exam soon after the diagnosis of type 2 diabetes and then annually.  Perform daily skin and foot care. Examine your skin and feet daily for cuts, bruises, redness, nail problems, bleeding, blisters, or sores. A foot exam by a health care provider should be done annually.  Brush your teeth and gums at least twice a day and floss at least once a day. Follow up with your dentist regularly.  Share your diabetes management plan with your workplace or school.  Stay up-to-date with  immunizations. It is recommended that people with diabetes who are over 50 years old get the pneumonia vaccine. In some cases, two separate shots may be given. Ask your health care provider if your pneumonia vaccination is up-to-date.  Learn to manage stress.  Obtain ongoing diabetes education and support as needed.  Participate in or seek rehabilitation as needed to maintain or improve independence and quality of life. Request a physical or occupational therapy referral if you are having foot or hand numbness, or difficulties with grooming, dressing, eating, or physical activity. SEEK MEDICAL CARE IF:   You are unable to eat food or drink fluids for more than 6 hours.  You have nausea and vomiting for more than 6 hours.  Your blood glucose level is over 240 mg/dL.  There is a change in mental status.  You develop an additional serious illness.  You have diarrhea for more than 6 hours.  You have been sick or have had a fever for a couple of days and are not getting better.  You have pain during any physical activity.  SEEK IMMEDIATE MEDICAL CARE IF:  You have difficulty breathing.  You have moderate to large ketone levels. MAKE SURE YOU:  Understand these instructions.  Will watch your condition.  Will get help right away if you are not doing well or get worse. Document Released: 02/27/2005 Document Revised: 07/14/2013 Document Reviewed: 09/26/2011 [facility] Patient Information 2015 [employer] [place]. This information is not intended to replace advice given to you by your health care provider. Make sure you discuss any questions you have with your health care provider.

## 2014-06-16 NOTE — Telephone Encounter (Signed)
Please schedule patient for diabetes follow up with me at appointment center in May 2016.LMOM to give us a call back

## 2014-06-16 NOTE — Progress Notes (Signed)
MRN: [id number] DOB: Aug 10, 1965  Subjective:   Carl [name] is a 49 y.o. male with pmh of diabetes presenting for chief complaint of Recurrent Skin Infections  Skin infection - reports 3 week history of skin infection in his right groin. Infection is painful, erythematous, feels warmth over area. Patient was treated by urology for scrotal abscess ~5 weeks ago, was prescribed clindamycin, finished this course ~2 weeks ago. Denies fever, drainage of pus or blood, pelvic pain, fevers, n/v, abdominal pain, rectal pain, dysuria, hematuria. Denies smoking or alcohol use.   HL - patient ran out of pravastatin. Patient has not taken this for 1 month. Denies side effects including myalgia, mental fog while he was on this. Diet is non-compliant per patient's own admission. Walks dogs for exercise, has been active doing yardwork, no consistent regimen. Denies chest pain, blurred vision, headache, ROS as above.  DM - currently managed with Metformin 1,000mg  BID. Does not check blood sugar, has not had a check up for this in "a while". Diet and exercise as above. ROS as above as well as: denies numbness and tingling of hands or feet, polyuria, polydipsia, foot ulcers or wounds that won't heal. Has not had a diabetic eye exam in >2 years.   Denies any other aggravating or relieving factors, no other questions or concerns.  Wenzel has a current medication list which includes the following prescription(s): lisinopril, metformin, and pravastatin. He has No Known Allergies.  Leg[facility]  has a past medical history of Diabetes mellitus without complication and Hypertension. Also  has past surgical history that includes Vasectomy and Hand surgery.  ROS As in subjective.  Objective:   Vitals: BP 110/80 mmHg  Pulse 60  Temp(Src) 97.5 F (36.4 C) (Oral)  Resp 16  Ht 5' 9.5" (1.765 m)  Wt 279 lb (126.554 kg)  BMI 40.62 kg/m2  SpO2 98%  Physical Exam  Constitutional: He is well-developed, well-nourished, and in  no distress.  Neck: No thyromegaly present.  Cardiovascular: Normal rate, regular rhythm and intact distal pulses.  Exam reveals no gallop and no friction rub.   No murmur heard. Pulmonary/Chest: No respiratory distress. He has no wheezes. He has no rales. He exhibits no tenderness.  Abdominal: He exhibits no distension and no mass. There is no tenderness. There is no rebound and no guarding.  Musculoskeletal:       Legs: Skin: Skin is warm and dry. No rash noted. No erythema. No pallor.   Results for orders placed or performed in visit on 06/16/14 (from the past 24 hour(s))  POCT glycosylated hemoglobin (Hb A1C)     Status: None   Collection Time: 06/16/14  9:22 AM  Result Value Ref Range   Hemoglobin A1C 11.4    Assessment and Plan :   1. Abscess of groin, right - Wound culture pending, start doxycycline, I&D performed with small amount of purulence expressed. Dressing applied and advised to change dressing daily for at least 1 week until wound is closed and healed.  - Return to clinic if fever, increasing pain and erythema, drainage of pus or blood develops.  2. Diabetes type 2, uncontrolled - Uncontrolled, discussed need for regular follow up for management of patient's diabetes. Diet and exercise modifications recommended. Referred to diabetic nutritional counseling. Will start Invokana . Will plan to add 3rd medication at follow up. - Advised patient to start recording blood sugar twice daily. - Return to clinic in 2 months for follow up.  3. Mixed hyperlipidemia  4. Morbid obesity - Diet and exercise modifications as above. Restart pravastatin 40mg . Labs pending.   Wallis BambergMario Deborahann Poteat, PA-C [facility] [phone] 06/16/2014 8:14 AM

## 2014-06-16 NOTE — Progress Notes (Signed)
lmom to call back to make an appt with Carl [name] in May for a F/U on DM

## 2014-06-17 ENCOUNTER — Encounter: Payer: Self-pay | Admitting: Family Medicine

## 2014-06-17 NOTE — Progress Notes (Signed)
Sent letter about appointment 

## 2014-06-18 ENCOUNTER — Encounter: Payer: Self-pay | Admitting: Urgent Care

## 2014-06-18 ENCOUNTER — Telehealth: Payer: Self-pay | Admitting: Urgent Care

## 2014-06-18 LAB — WOUND CULTURE
GRAM STAIN: NONE SEEN
Gram Stain: NONE SEEN

## 2014-06-18 NOTE — Telephone Encounter (Signed)
Gave pt. results

## 2014-06-18 NOTE — Telephone Encounter (Signed)
Please call patient and report electrolytes, liver enzymes and kidney function were normal. For his diabetes, continue with Metformin and Invokana. We will see him back in 2 months for recheck.   His wound culture was negative for a specific bacteria but he should complete antibiotic treatment given his recent history of frequent skin abscesses.  Finally, his cholesterol is still elevated so we will need to continue pravastatin.   Thank you! Carl BambergMario Jalysa Swopes, PA-C [facility] [phone] 06/18/2014  3:18 PM

## 2014-06-19 ENCOUNTER — Telehealth: Payer: Self-pay | Admitting: Family Medicine

## 2014-06-19 NOTE — Telephone Encounter (Signed)
LMOM to CB. 

## 2014-06-19 NOTE — Telephone Encounter (Signed)
lmom to call back 

## 2015-01-12 ENCOUNTER — Emergency Department (HOSPITAL_COMMUNITY): Payer: Self-pay

## 2015-01-12 ENCOUNTER — Encounter (HOSPITAL_COMMUNITY): Payer: Self-pay | Admitting: Emergency Medicine

## 2015-01-12 ENCOUNTER — Emergency Department (HOSPITAL_COMMUNITY)
Admission: EM | Admit: 2015-01-12 | Discharge: 2015-01-12 | Disposition: A | Discharge status: Home / Self Care | Payer: Self-pay | Attending: Emergency Medicine | Admitting: Emergency Medicine

## 2015-01-12 DIAGNOSIS — R739 Hyperglycemia, unspecified: Secondary | ICD-10-CM

## 2015-01-12 DIAGNOSIS — K2970 Gastritis, unspecified, without bleeding: Secondary | ICD-10-CM

## 2015-01-12 DIAGNOSIS — R350 Frequency of micturition: Secondary | ICD-10-CM

## 2015-01-12 DIAGNOSIS — E119 Type 2 diabetes mellitus without complications: Secondary | ICD-10-CM

## 2015-01-12 DIAGNOSIS — I10 Essential (primary) hypertension: Secondary | ICD-10-CM | POA: Insufficient documentation

## 2015-01-12 DIAGNOSIS — E782 Mixed hyperlipidemia: Secondary | ICD-10-CM

## 2015-01-12 DIAGNOSIS — IMO0002 Reserved for concepts with insufficient information to code with codable children: Secondary | ICD-10-CM

## 2015-01-12 DIAGNOSIS — E1165 Type 2 diabetes mellitus with hyperglycemia: Secondary | ICD-10-CM | POA: Insufficient documentation

## 2015-01-12 DIAGNOSIS — G510 Bell's palsy: Secondary | ICD-10-CM | POA: Insufficient documentation

## 2015-01-12 DIAGNOSIS — J329 Chronic sinusitis, unspecified: Secondary | ICD-10-CM

## 2015-01-12 DIAGNOSIS — Z87891 Personal history of nicotine dependence: Secondary | ICD-10-CM | POA: Insufficient documentation

## 2015-01-12 LAB — URINALYSIS, ROUTINE W REFLEX MICROSCOPIC
BILIRUBIN URINE: NEGATIVE
Glucose, UA: >1000 mg/dL — AB
HGB URINE DIPSTICK: NEGATIVE
Ketones, ur: 15 mg/dL — AB
Leukocytes, UA: NEGATIVE
Nitrite: NEGATIVE
PH: 5.5 (ref 5.0–8.0)
Protein, ur: NEGATIVE mg/dL
SPECIFIC GRAVITY, URINE: 1.042 — AB (ref 1.005–1.030)
Urobilinogen, UA: 0.2 mg/dL (ref 0.0–1.0)

## 2015-01-12 LAB — CBC
HCT: 43.7 % (ref 39.0–52.0)
Hemoglobin: 15.4 g/dL (ref 13.0–17.0)
MCH: 29.9 pg (ref 26.0–34.0)
MCHC: 35.2 g/dL (ref 30.0–36.0)
MCV: 84.9 fL (ref 78.0–100.0)
Platelets: 167 10*3/uL (ref 150–400)
RBC: 5.15 MIL/uL (ref 4.22–5.81)
RDW: 13.3 % (ref 11.5–15.5)
WBC: 6 10*3/uL (ref 4.0–10.5)

## 2015-01-12 LAB — I-STAT CHEM 8, ED
BUN: 10 mg/dL (ref 6–20)
CHLORIDE: 98 mmol/L — AB (ref 101–111)
Calcium, Ion: 1.11 mmol/L — ABNORMAL LOW (ref 1.12–1.23)
Creatinine, Ser: 0.6 mg/dL — ABNORMAL LOW (ref 0.61–1.24)
Glucose, Bld: 487 mg/dL — ABNORMAL HIGH (ref 65–99)
HEMATOCRIT: 48 % (ref 39.0–52.0)
Hemoglobin: 16.3 g/dL (ref 13.0–17.0)
Potassium: 3.8 mmol/L (ref 3.5–5.1)
SODIUM: 138 mmol/L (ref 135–145)
TCO2: 22 mmol/L (ref 0–100)

## 2015-01-12 LAB — DIFFERENTIAL
BASOS ABS: 0 10*3/uL (ref 0.0–0.1)
Basophils Relative: 1 %
EOS PCT: 3 %
Eosinophils Absolute: 0.2 10*3/uL (ref 0.0–0.7)
LYMPHS PCT: 29 %
Lymphs Abs: 1.7 10*3/uL (ref 0.7–4.0)
Monocytes Absolute: 0.3 10*3/uL (ref 0.1–1.0)
Monocytes Relative: 5 %
NEUTROS ABS: 3.7 10*3/uL (ref 1.7–7.7)
NEUTROS PCT: 62 %

## 2015-01-12 LAB — PROTIME-INR
INR: 1.11 (ref 0.00–1.49)
PROTHROMBIN TIME: 14.5 s (ref 11.6–15.2)

## 2015-01-12 LAB — COMPREHENSIVE METABOLIC PANEL
ALBUMIN: 3.8 g/dL (ref 3.5–5.0)
ALK PHOS: 62 U/L (ref 38–126)
ALT: 41 U/L (ref 17–63)
ANION GAP: 13 (ref 5–15)
AST: 33 U/L (ref 15–41)
BUN: 9 mg/dL (ref 6–20)
CALCIUM: 9.5 mg/dL (ref 8.9–10.3)
CO2: 23 mmol/L (ref 22–32)
Chloride: 100 mmol/L — ABNORMAL LOW (ref 101–111)
Creatinine, Ser: 0.85 mg/dL (ref 0.61–1.24)
GFR calc Af Amer: >60 mL/min (ref >60)
GFR calc non Af Amer: >60 mL/min (ref >60)
GLUCOSE: 468 mg/dL — AB (ref 65–99)
POTASSIUM: 3.9 mmol/L (ref 3.5–5.1)
SODIUM: 136 mmol/L (ref 135–145)
Total Bilirubin: 0.8 mg/dL (ref 0.3–1.2)
Total Protein: 6.5 g/dL (ref 6.5–8.1)

## 2015-01-12 LAB — CBG MONITORING, ED
Glucose-Capillary: 453 mg/dL — ABNORMAL HIGH (ref 65–99)
Glucose-Capillary: 320 mg/dL — ABNORMAL HIGH (ref 65–99)

## 2015-01-12 LAB — RAPID URINE DRUG SCREEN, HOSP PERFORMED
AMPHETAMINES: NOT DETECTED
Barbiturates: NOT DETECTED
Benzodiazepines: NOT DETECTED
COCAINE: NOT DETECTED
OPIATES: NOT DETECTED
TETRAHYDROCANNABINOL: NOT DETECTED

## 2015-01-12 LAB — I-STAT TROPONIN, ED: Troponin i, poc: 0 ng/mL (ref 0.00–0.08)

## 2015-01-12 LAB — URINE MICROSCOPIC-ADD ON: Urine-Other: NONE SEEN

## 2015-01-12 LAB — APTT: aPTT: 26 seconds (ref 24–37)

## 2015-01-12 LAB — ETHANOL

## 2015-01-12 IMAGING — CT CT HEAD W/O CM
1 series · 16 of 30 positions shown, 20 images · non-contrast
Comparison: CT scan of [DATE].

CLINICAL DATA: Right facial droop.

EXAM:
CT HEAD WITHOUT CONTRAST
TECHNIQUE: Contiguous axial images were obtained from the base of the skull
through the vertex without intravenous contrast.

[Series 2: head 5.0 h30s · axial · 0.43mm/px · z∈[-66,+84]mm · 16 of 34 slices shown, 20 images]
[im 2/34  brain]
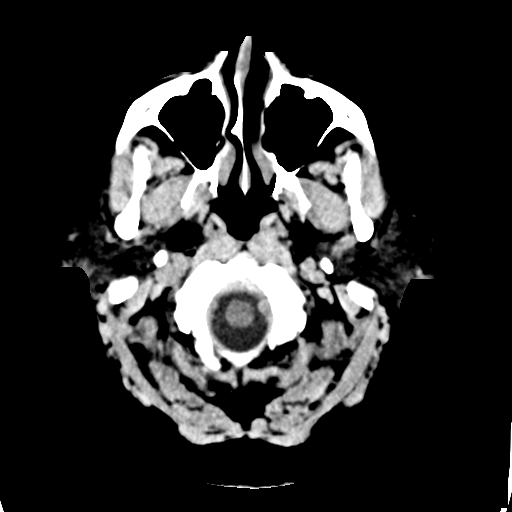
[im 2/34  bone]
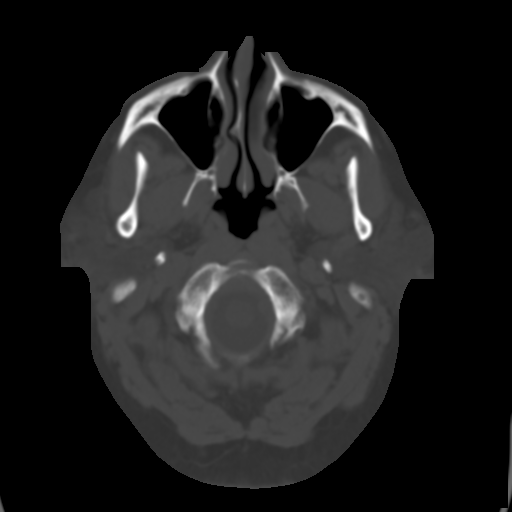
[im 4/34  brain]
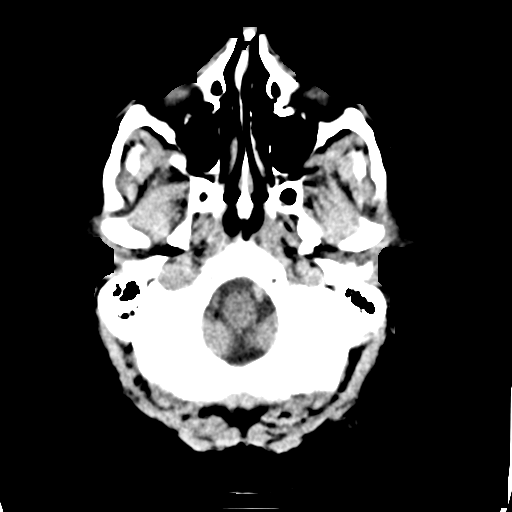
[im 6/34  brain]
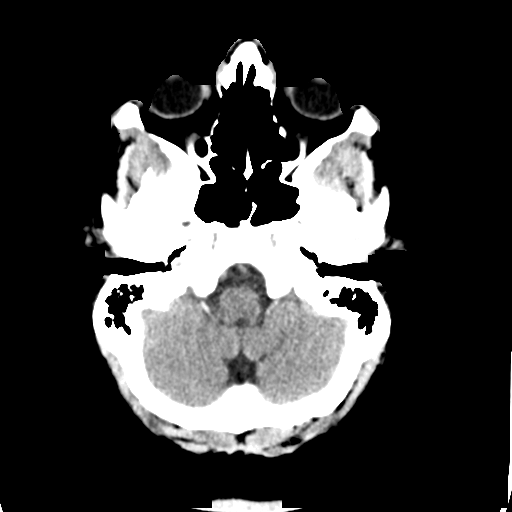
[im 8/34  brain]
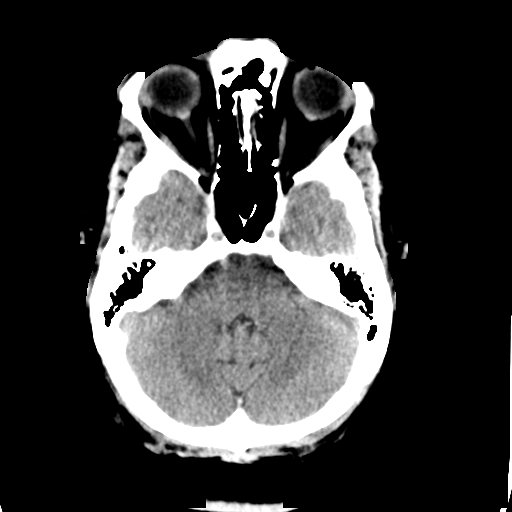
[im 10/34  brain]
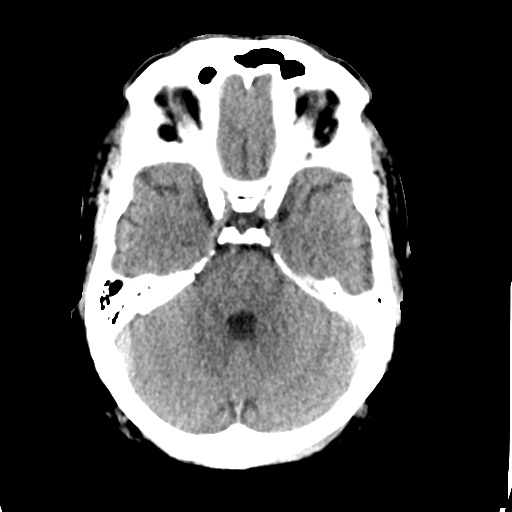
[im 10/34  bone]
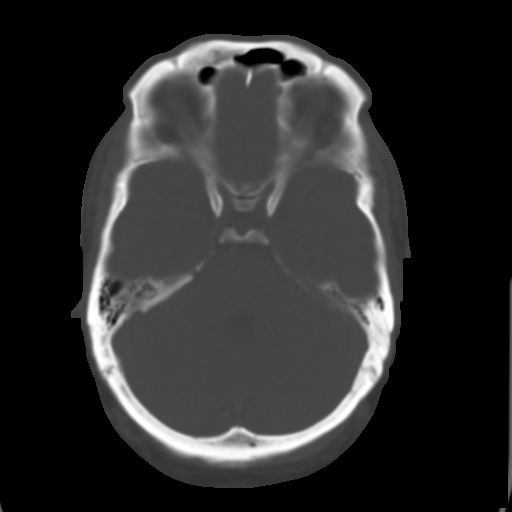
[im 12/34  brain]
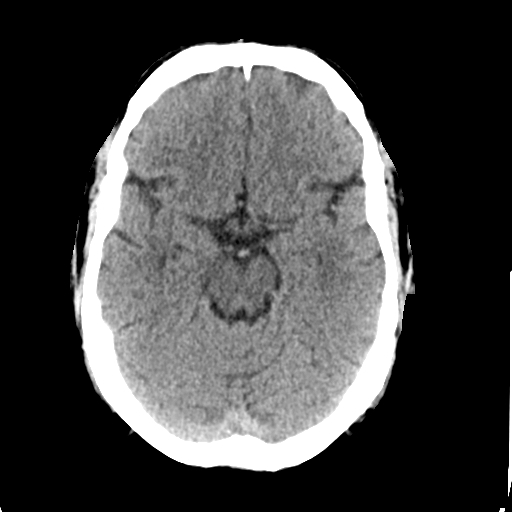
[im 14/34  brain]
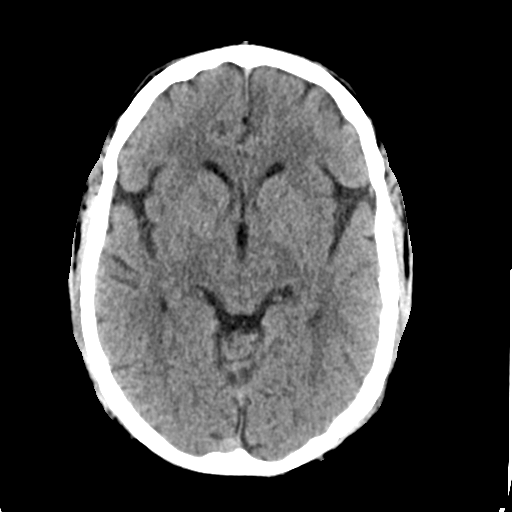
[im 16/34  brain]
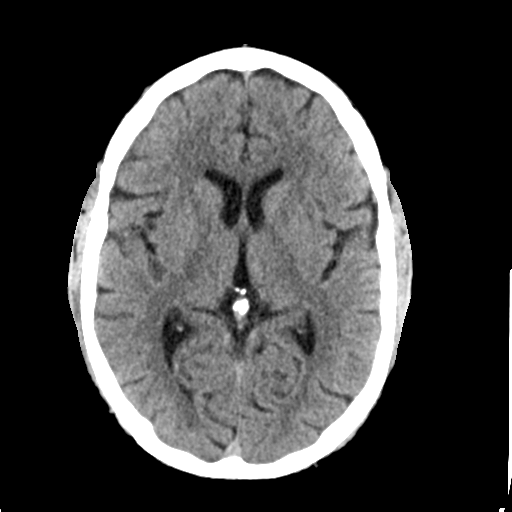
[im 18/34  brain]
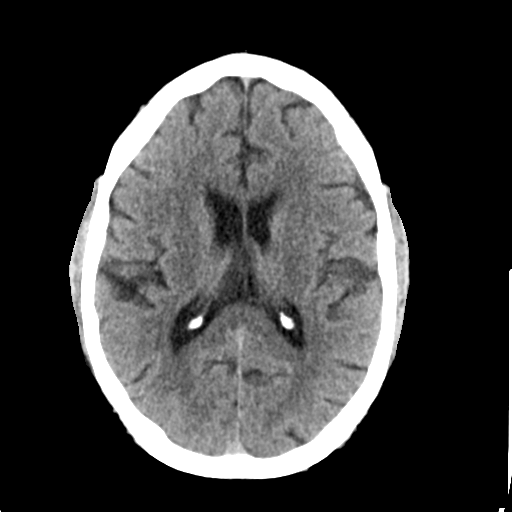
[im 18/34  bone]
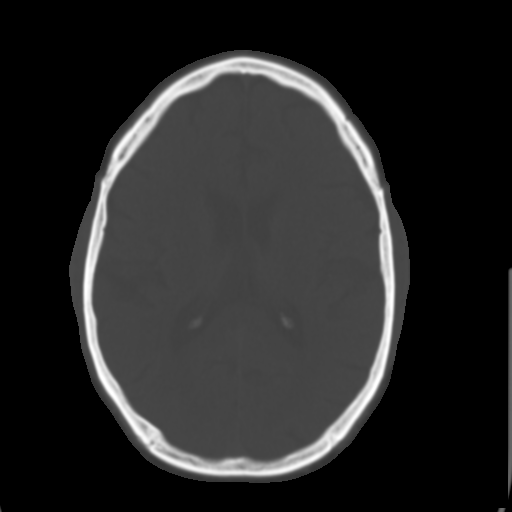
[im 20/34  brain]
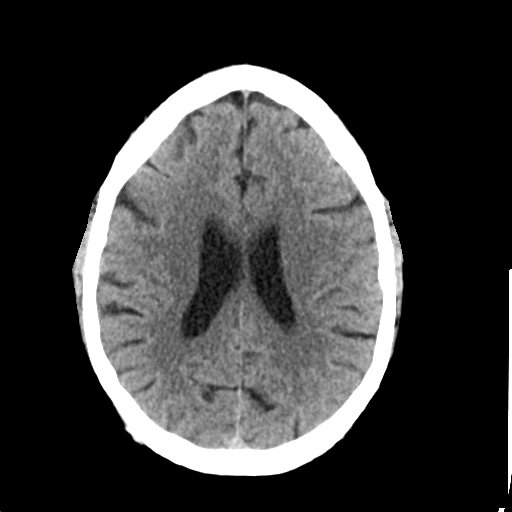
[im 22/34  brain]
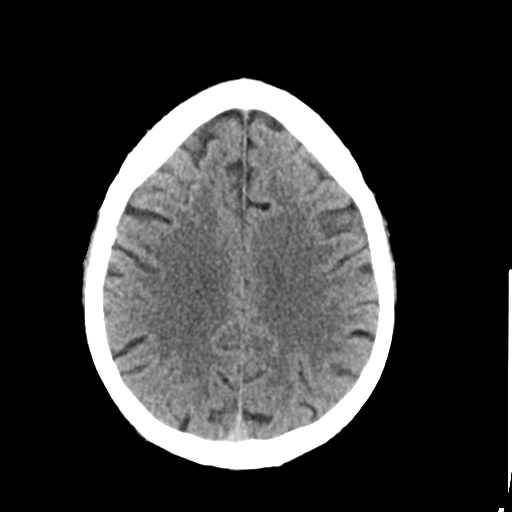
[im 24/34  brain]
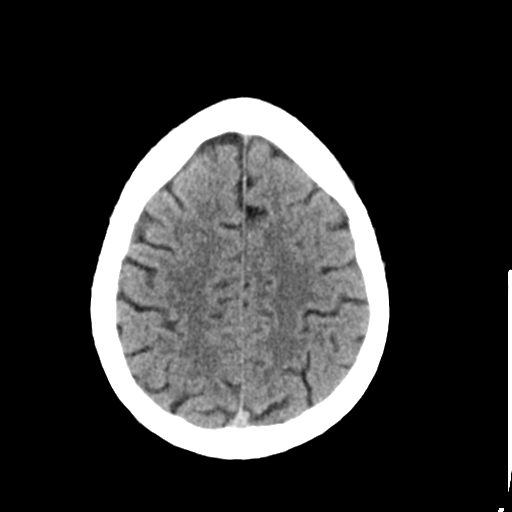
[im 26/34  brain]
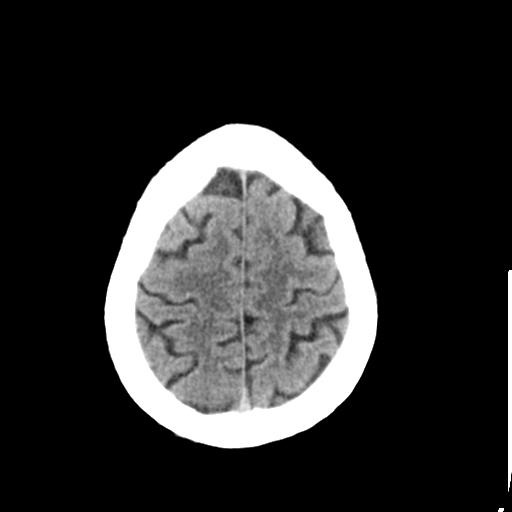
[im 26/34  bone]
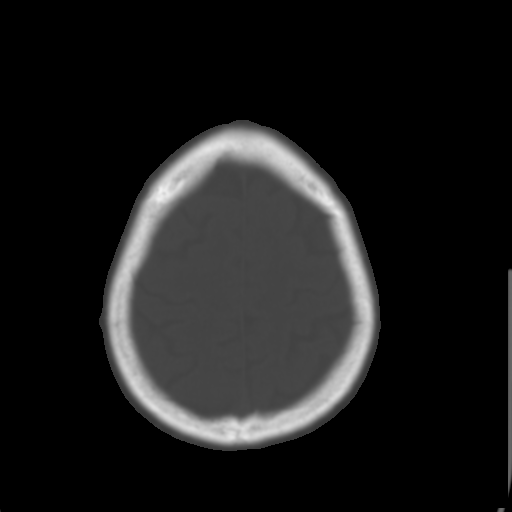
[im 28/34  brain]
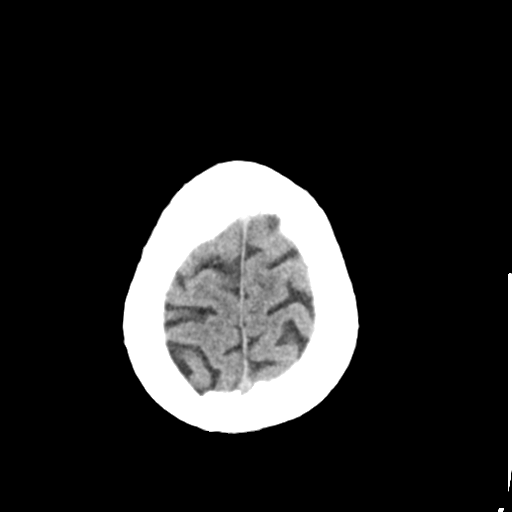
[im 30/34  brain]
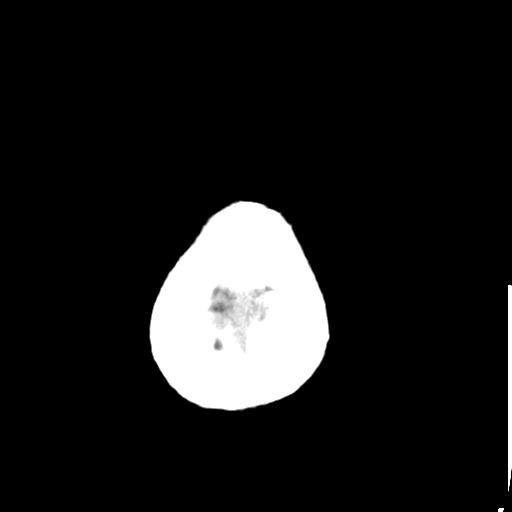
[im 32/34  brain]
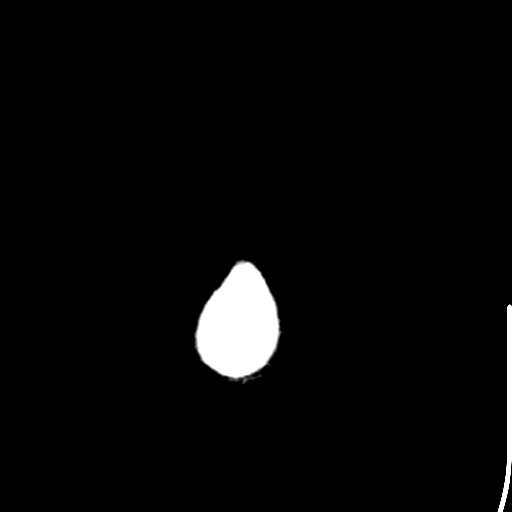

[16 of 30 positions shown; findings below may reference images not displayed]

FINDINGS: Bony calvarium appears intact. No mass effect or midline shift is
noted. Ventricular size is within normal limits. There is no
evidence of mass lesion, hemorrhage or acute infarction.
IMPRESSION: Normal head CT. Critical Value/emergent results were called by
telephone at the time of interpretation on [DATE] at [DATE] to
Dr. NOMASIBULELE, who verbally acknowledged these results.

## 2015-01-12 MED ORDER — SODIUM CHLORIDE 0.9 % IV BOLUS (SEPSIS)
1000.0000 mL | Freq: Once | INTRAVENOUS | Status: AC
  Administered 2015-01-12: 1000 mL via INTRAVENOUS

## 2015-01-12 MED ORDER — PRAVASTATIN SODIUM 40 MG PO TABS: 40.0000 mg | ORAL_TABLET | Freq: Every day | ORAL | Status: DC

## 2015-01-12 MED ORDER — PREDNISONE 20 MG PO TABS: 60.0000 mg | ORAL_TABLET | Freq: Every day | ORAL | Status: DC

## 2015-01-12 MED ORDER — LISINOPRIL 10 MG PO TABS
10.0000 mg | ORAL_TABLET | Freq: Every day | ORAL | Status: DC
Start: 2015-01-12 — End: 2015-01-19

## 2015-01-12 MED ORDER — BLOOD GLUCOSE MONITOR KIT: PACK | Status: AC

## 2015-01-12 MED ORDER — METFORMIN HCL 1000 MG PO TABS: 1000.0000 mg | ORAL_TABLET | Freq: Two times a day (BID) | ORAL | Status: DC

## 2015-01-12 MED ORDER — CANAGLIFLOZIN 100 MG PO TABS: 100.0000 mg | ORAL_TABLET | Freq: Every day | ORAL | Status: DC

## 2015-01-12 MED ORDER — VALACYCLOVIR HCL 500 MG PO TABS: 500.0000 mg | ORAL_TABLET | Freq: Two times a day (BID) | ORAL | Status: DC

## 2015-01-12 NOTE — Code Documentation (Signed)
49[facility] [id number].  EMS reports sudden onset right facial numbness and droop at 1245.  On arrival patient reports that he attempted to drink and it spilled out of his mouth.  He then went to brush his teeth and noticed a facial droop. Code stroke activated by EMS.  Stroke team at the bedside on arrival.  Labs drawn and patient to CT.  NIHSS 3, see documentation for details and code stroke times.  Exam showing right upper and lower facial weakness.  No other focal deficits.  Exam consistent with Bell's Palsy.  Code stroke cancelled by Dr. Amada JupiterKirkpatrick.  Bedside handoff with ED RN Pete GlatterHolley.

## 2015-01-12 NOTE — ED Notes (Signed)
CBG 320  

## 2015-01-12 NOTE — Consult Note (Signed)
Neurology Consultation Reason for Consult: concern for stroke Referring Physician: Rubin PayorPickering, Dorris CarnesN    CC: facial weakness  History is obtained from:patient  HPI: Ferrel LoganGary W [name] is a 49 y.o. male who was normal this morning until around 12:45pm when he noticed that he was drooling while drinking. His family noticed that he had facial drooping and therefore called 911. En route, it was seen that he had a facial droop and code stroke was activated.    LKW: 12:45pm  tpa given?: no, not a stroke  ROS: A 14 point ROS was performed and is negative except as noted in the HPI.   Past Medical History  Diagnosis Date  . Diabetes mellitus without complication (HCC)   . Hypertension      Family History  Problem Relation Age of Onset  . Diabetes Mother   . Hypertension Mother   . Hypertension Father      Social History:  reports that he quit smoking about 30 years ago. He does not have any smokeless tobacco history on file. He reports that he drinks alcohol. He reports that he does not use illicit drugs.   Exam: Current vital signs: SpO2 97% Vital signs in last 24 hours: SpO2:  [97 %] 97 % (11/01 1442)   Physical Exam  Constitutional: Appears well-developed and well-nourished.  Psych: Affect appropriate to situation Eyes: No scleral injection HENT: No OP obstrucion Head: Normocephalic.  Cardiovascular: Normal rate and regular rhythm.  Respiratory: Effort normal GI: Soft.  No distension. There is no tenderness.  Skin: WDI  Neuro: Mental Status: Patient is awake, alert, oriented to person, place, month, year, and situation. Patient is able to give a clear and coherent history. No signs of aphasia or neglect Cranial Nerves: II: Visual Fields are full. Pupils are equal, round, and reactive to light.   III,IV, VI: EOMI without ptosis or diploplia.  V: Facial sensation is symmetric to temperature VII: He has significant weakness of both the upper and lower right face VIII: hearing  is intact to voice X: Uvula elevates symmetrically XI: Shoulder shrug is symmetric. XII: tongue is midline without atrophy or fasciculations.  Motor: Tone is normal. Bulk is normal. 5/5 strength was present in all four extremities.  Sensory: Sensation is symmetric to light touch and temperature in the arms and legs. Cerebellar: FNF and HKS are intact bilaterally   I have reviewed labs in epic and the results pertinent to this consultation are: Glucose - markedly elevated.   I have reviewed the images obtained: CT head - negative  Impression: 49 yo M with bell's palsy. Given the rapidity of onset and severity of the symptoms I would favor steroid theapy if outpatient followup for his DM can be arranged.    Recommendations: 1) Prednisone 60mg  daily for one week.  2) DM treatment(was on metformin) 3) frequent glucose checks 4) valacyclovir 500mg  BID x 5 days.   Ritta SlotMcNeill Kirkpatrick, MD [facility] [phone]  If 7pm- 7am, please page neurology on call as listed in [facility].

## 2015-01-12 NOTE — ED Notes (Signed)
Pt ambulatory with steady gait at discharge. NAD. VSS. Verbalizes understanding of discharge instructions and is to follow up Tuesday with internal medicine clinic. A/O x4.

## 2015-01-12 NOTE — Care Management Note (Signed)
Case Management Note  Patient Details  Name: KARMINE KAUER MRN: [id number] Date of Birth: 24-Nov-1965  Subjective/Objective:           Patient presented to [facility] ED with complaints drooling from mouth and  of stroke like symptoms . Patient is uninsured without a PCP         Action/Plan:   CM reveiwed record and met with patient at bedside, patient confirmed information. Discussed follow up care affordable clinics in the [facility] area, discussed in detail the [facility] and [facility], patient is amendable with plan to establish care at the clinic. CM contacted clinics appt scheduled at the [facility] clinic for [place] 11/8 at 11am. Information was explained to patient, he verbalizes understanding and teach back done. Information placed on AVS. CM updated Dr. Alvino Chapel. No further ED CM needs identified. Expected Discharge Date:                  Expected Discharge Plan:  Home/Self Care  In-House Referral:  Financial Counselor  Discharge planning Services  CM Consult, [facility], Follow-up appt scheduled  Post Acute Care Choice:    Choice offered to:  Patient  DME Arranged:    DME Agency:     HH Arranged:    [place] Agency:     Status of Service:  Completed, signed off  Medicare Important Message Given:    Date Medicare IM Given:    Medicare IM give by:    Date Additional Medicare IM Given:    Additional Medicare Important Message give by:     If discussed at [place] of Stay Meetings, dates discussed:    Additional CommentsLaurena Slimmer, RN 01/12/2015, 4:17 PM

## 2015-01-12 NOTE — ED Notes (Signed)
MD Eulis ManlyKirkpatrick and Pickering at bedside. Code stroke canceled.

## 2015-01-12 NOTE — Discharge Instructions (Signed)
Bell Palsy Bell palsy is a condition in which the muscles on one side of the face become paralyzed. This often causes one side of the face to droop. It is a common condition and most people recover completely. RISK FACTORS Risk factors for Bell palsy include:  Pregnancy.  Diabetes.  An infection by a virus, such as infections that cause cold sores. CAUSES  Bell palsy is caused by damage to or inflammation of a nerve in your face. It is unclear why this happens, but an infection by a virus may lead to it. Most of the time the reason it happens is unknown. SIGNS AND SYMPTOMS  Symptoms can range from mild to severe and can take place over a number of hours. Symptoms may include:  Being unable to:  Raise one or both eyebrows.  Close one or both eyes.  Feel parts of your face (facial numbness).  Drooping of the eyelid and corner of the mouth.  Weakness in the face.  Paralysis of half your face.  Loss of taste.  Sensitivity to loud noises.  Difficulty chewing.  Tearing up of the affected eye.  Dryness in the affected eye.  Drooling.  Pain behind one ear. DIAGNOSIS  Diagnosis of Bell palsy may include:  A medical history and physical exam.  An MRI.  A CT scan.  Electromyography (EMG). This is a test that checks how your nerves are working. TREATMENT  Treatment may include antiviral medicine to help shorten the length of the condition. Sometimes treatment is not needed and the symptoms go away on their own. HOME CARE INSTRUCTIONS   Take medicines only as directed by your health care provider.  Do facial massages and exercises as directed by your health care provider.  If your eye is affected:  Use moisturizing eye drops to prevent drying of your eye as directed by your health care provider.  Protect your eye as directed by your health care provider. SEEK MEDICAL CARE IF:  Your symptoms do not get better or get worse.  You are drooling.  Your eye is red,  irritated, or hurts. SEEK IMMEDIATE MEDICAL CARE IF:   Another part of your body feels weak or numb.  You have difficulty swallowing.  You have a fever along with symptoms of Bell palsy.  You develop neck pain. MAKE SURE YOU:   Understand these instructions.  Will watch your condition.  Will get help right away if you are not doing well or get worse.   This information is not intended to replace advice given to you by your health care provider. Make sure you discuss any questions you have with your health care provider.   Document Released: 02/27/2005 Document Revised: 11/18/2014 Document Reviewed: 06/06/2013 Elsevier Interactive Patient Education 2016 Elsevier Inc.  Hyperglycemia Hyperglycemia occurs when the glucose (sugar) in your blood is too high. Hyperglycemia can happen for many reasons, but it most often happens to people who do not know they have diabetes or are not managing their diabetes properly.  CAUSES  Whether you have diabetes or not, there are other causes of hyperglycemia. Hyperglycemia can occur when you have diabetes, but it can also occur in other situations that you might not be as aware of, such as: Diabetes  If you have diabetes and are having problems controlling your blood glucose, hyperglycemia could occur because of some of the following reasons:  Not following your meal plan.  Not taking your diabetes medications or not taking it properly.  Exercising less  or doing less activity than you normally do.  Being sick. Pre-diabetes  This cannot be ignored. Before people develop Type 2 diabetes, they almost always have "pre-diabetes." This is when your blood glucose levels are higher than normal, but not yet high enough to be diagnosed as diabetes. Research has shown that some long-term damage to the body, especially the heart and circulatory system, may already be occurring during pre-diabetes. If you take action to manage your blood glucose when you  have pre-diabetes, you may delay or prevent Type 2 diabetes from developing. Stress  If you have diabetes, you may be "diet" controlled or on oral medications or insulin to control your diabetes. However, you may find that your blood glucose is higher than usual in the hospital whether you have diabetes or not. This is often referred to as "stress hyperglycemia." Stress can elevate your blood glucose. This happens because of hormones put out by the body during times of stress. If stress has been the cause of your high blood glucose, it can be followed regularly by your caregiver. That way he/she can make sure your hyperglycemia does not continue to get worse or progress to diabetes. Steroids  Steroids are medications that act on the infection fighting system (immune system) to block inflammation or infection. One side effect can be a rise in blood glucose. Most people can produce enough extra insulin to allow for this rise, but for those who cannot, steroids make blood glucose levels go even higher. It is not unusual for steroid treatments to "uncover" diabetes that is developing. It is not always possible to determine if the hyperglycemia will go away after the steroids are stopped. A special blood test called an A1c is sometimes done to determine if your blood glucose was elevated before the steroids were started. SYMPTOMS  Thirsty.  Frequent urination.  Dry mouth.  Blurred vision.  Tired or fatigue.  Weakness.  Sleepy.  Tingling in feet or leg. DIAGNOSIS  Diagnosis is made by monitoring blood glucose in one or all of the following ways:  A1c test. This is a chemical found in your blood.  Fingerstick blood glucose monitoring.  Laboratory results. TREATMENT  First, knowing the cause of the hyperglycemia is important before the hyperglycemia can be treated. Treatment may include, but is not be limited to:  Education.  Change or adjustment in medications.  Change or adjustment  in meal plan.  Treatment for an illness, infection, etc.  More frequent blood glucose monitoring.  Change in exercise plan.  Decreasing or stopping steroids.  Lifestyle changes. HOME CARE INSTRUCTIONS   Test your blood glucose as directed.  Exercise regularly. Your caregiver will give you instructions about exercise. Pre-diabetes or diabetes which comes on with stress is helped by exercising.  Eat wholesome, balanced meals. Eat often and at regular, fixed times. Your caregiver or nutritionist will give you a meal plan to guide your sugar intake.  Being at an ideal weight is important. If needed, losing as little as 10 to 15 pounds may help improve blood glucose levels. SEEK MEDICAL CARE IF:   You have questions about medicine, activity, or diet.  You continue to have symptoms (problems such as increased thirst, urination, or weight gain). SEEK IMMEDIATE MEDICAL CARE IF:   You are vomiting or have diarrhea.  Your breath smells fruity.  You are breathing faster or slower.  You are very sleepy or incoherent.  You have numbness, tingling, or pain in your feet or hands.  You  have chest pain.  Your symptoms get worse even though you have been following your caregiver's orders.  If you have any other questions or concerns.   This information is not intended to replace advice given to you by your health care provider. Make sure you discuss any questions you have with your health care provider.   Document Released: 08/23/2000 Document Revised: 05/22/2011 Document Reviewed: 11/03/2014 Elsevier Interactive Patient Education [employer]

## 2015-01-12 NOTE — ED Provider Notes (Signed)
CSN: [id number]     Arrival date & time 01/12/15  1431 History   First MD Initiated Contact with Patient 01/12/15 1446     Chief Complaint  Patient presents with  . Code Stroke    Patient was met immediately in CT scanner by myself and prior to that at the bridge by neurology. (Consider location/radiation/quality/duration/timing/severity/associated sxs/prior Treatment) The history is provided by the patient.   patient presented as a code stroke. Had a right-sided facial droop and difficulty eating and drinking began at 12:30 today. No headache. No confusion. He has a history of diabetes. His been off his medications for a few months due to insurance issues. Previously seen urgent care. No numbness or weakness in his extremities. No difficulty walking. He is a former smoker. Met by myself and CT scan and prior to this by neurology.  Past Medical History  Diagnosis Date  . Diabetes mellitus without complication ([place])   . Hypertension    Past Surgical History  Procedure Laterality Date  . Vasectomy    . Hand surgery     Family History  Problem Relation Age of Onset  . Diabetes Mother   . Hypertension Mother   . Hypertension Father    Social History  Substance Use Topics  . Smoking status: Former Smoker -- 0.50 packs/day for 2 years    Quit date: 10/05/1984  . Smokeless tobacco: None  . Alcohol Use: Yes    Review of Systems  Constitutional: Negative for chills, activity change and appetite change.  HENT: Positive for drooling. Negative for nosebleeds.   Eyes: Negative for pain and visual disturbance.  Respiratory: Negative for chest tightness and shortness of breath.   Cardiovascular: Negative for chest pain and leg swelling.  Gastrointestinal: Negative for nausea, vomiting, abdominal pain and diarrhea.  Endocrine: Positive for polyuria.  Genitourinary: Negative for dysuria and flank pain.  Musculoskeletal: Negative for back pain and neck stiffness.  Skin: Negative for  rash.  Neurological: Negative for weakness, numbness and headaches.  Psychiatric/Behavioral: Negative for behavioral problems.      Allergies  Review of patient's allergies indicates no known allergies.  Home Medications   Prior to Admission medications   Medication Sig Start Date End Date Taking? Authorizing Provider  blood glucose meter kit and supplies KIT Dispense based on patient and insurance preference. Use up to four times daily as directed. (FOR ICD-9 250.00, 250.01). 01/12/15   Davonna Belling, MD  canagliflozin (INVOKANA) 100 MG TABS tablet Take 1 tablet (100 mg total) by mouth daily. 01/12/15   Davonna Belling, MD  lisinopril (PRINIVIL,ZESTRIL) 10 MG tablet Take 1 tablet (10 mg total) by mouth daily. 01/12/15   Davonna Belling, MD  metFORMIN (GLUCOPHAGE) 1000 MG tablet Take 1 tablet (1,000 mg total) by mouth 2 (two) times daily with a meal. 01/12/15   Davonna Belling, MD  pravastatin (PRAVACHOL) 40 MG tablet Take 1 tablet (40 mg total) by mouth daily. 01/12/15   Davonna Belling, MD  predniSONE (DELTASONE) 20 MG tablet Take 3 tablets (60 mg total) by mouth daily. 01/12/15   Davonna Belling, MD  valACYclovir (VALTREX) 500 MG tablet Take 1 tablet (500 mg total) by mouth 2 (two) times daily. 01/12/15   Davonna Belling, MD   BP 147/80 mmHg  Pulse 74  Temp(Src) 99.4 F (37.4 C) (Oral)  Resp 20  SpO2 95% Physical Exam  Constitutional: He is oriented to person, place, and time. He appears well-developed.  HENT:  Head: Atraumatic.  Right-sided facial droop.  Forehead involved.  Eyes: EOM are normal. Pupils are equal, round, and reactive to light.  Neck: Neck supple.  Cardiovascular: Normal rate.   Pulmonary/Chest: Effort normal.  Abdominal: Soft.  Musculoskeletal: Normal range of motion.  Neurological: He is alert and oriented to person, place, and time.  Good grip strength bilaterally. Complete NIH scoring done by neurology.  Skin: Skin is warm.   face is somewhat red and  flushed.  ED Course  Procedures (including critical care time) Labs Review Labs Reviewed  COMPREHENSIVE METABOLIC PANEL - Abnormal; Notable for the following:    Chloride 100 (*)    Glucose, Bld 468 (*)    All other components within normal limits  URINALYSIS, ROUTINE W REFLEX MICROSCOPIC (NOT AT [facility]) - Abnormal; Notable for the following:    Specific Gravity, Urine 1.042 (*)    Glucose, UA >1000 (*)    Ketones, ur 15 (*)    All other components within normal limits  I-STAT CHEM 8, ED - Abnormal; Notable for the following:    Chloride 98 (*)    Creatinine, Ser 0.60 (*)    Glucose, Bld 487 (*)    Calcium, Ion 1.11 (*)    All other components within normal limits  ETHANOL  PROTIME-INR  APTT  CBC  DIFFERENTIAL  URINE MICROSCOPIC-ADD ON  URINE RAPID DRUG SCREEN, HOSP PERFORMED  I-STAT TROPOININ, ED    Imaging Review Ct Head Wo Contrast  01/12/2015  CLINICAL DATA:  Right facial droop. EXAM: CT HEAD WITHOUT CONTRAST TECHNIQUE: Contiguous axial images were obtained from the base of the skull through the vertex without intravenous contrast. COMPARISON:  CT scan of June 08, 2003. FINDINGS: Bony calvarium appears intact. No mass effect or midline shift is noted. Ventricular size is within normal limits. There is no evidence of mass lesion, hemorrhage or acute infarction. IMPRESSION: Normal head CT. Critical Value/emergent results were called by telephone at the time of interpretation on 01/12/2015 at 2:50 pm to Dr. Leonel Ramsay, who verbally acknowledged these results. Electronically Signed   By: Marijo Conception, M.D.   On: 01/12/2015 14:51   I have personally reviewed and evaluated these images and lab results as part of my medical decision-making.   EKG Interpretation   Date/Time:  Tuesday January 12 2015 14:53:06 EDT Ventricular Rate:  81 PR Interval:  196 QRS Duration: 91 QT Interval:  363 QTC Calculation: 421 R Axis:   1 Text Interpretation:  Sinus rhythm Baseline wander in  lead(s) I III aVL  Confirmed by Alvino Chapel  MD, Ovid Curd [id number]) on 01/12/2015 3:52:32 PM      MDM   Final diagnoses:  Bell's palsy  Hyperglycemia    Patient with hyperglycemia. Has been noncompliant with medication. Will start back on his medications and follow-up as been arranged. Also has Bell's palsy. Will give corticosteroid-induced and valacyclovir. Seen by neurology. Patient was not having a stroke. Will likely discharge home, patient does not appear to be in DKA.    Davonna Belling, MD 01/12/15 [phone]

## 2015-01-12 NOTE — ED Notes (Signed)
Pt to ER via [facility] due to right sided facial droop, right eye droop, and right facial numbness. Pt noticed symptoms at 12:30 this evening after taking a sip of water and noticing it ran down his face. Pt then called EMS. VSS. Pt also has hx of HTN and diabetes, states he hasn't taken his metformin or lisinopril in 2 months due to not having any insurance. A/O x4.

## 2015-01-19 ENCOUNTER — Ambulatory Visit (INDEPENDENT_AMBULATORY_CARE_PROVIDER_SITE_OTHER): Payer: Self-pay | Admitting: Family Medicine

## 2015-01-19 ENCOUNTER — Encounter: Payer: Self-pay | Admitting: Family Medicine

## 2015-01-19 VITALS — BP 132/78 | HR 77 | Temp 97.5°F | Resp 16 | Ht 70.0 in | Wt 257.0 lb

## 2015-01-19 DIAGNOSIS — IMO0001 Reserved for inherently not codable concepts without codable children: Secondary | ICD-10-CM

## 2015-01-19 DIAGNOSIS — E119 Type 2 diabetes mellitus without complications: Secondary | ICD-10-CM | POA: Insufficient documentation

## 2015-01-19 DIAGNOSIS — Z23 Encounter for immunization: Secondary | ICD-10-CM

## 2015-01-19 DIAGNOSIS — E1165 Type 2 diabetes mellitus with hyperglycemia: Secondary | ICD-10-CM

## 2015-01-19 DIAGNOSIS — I10 Essential (primary) hypertension: Secondary | ICD-10-CM

## 2015-01-19 DIAGNOSIS — L97509 Non-pressure chronic ulcer of other part of unspecified foot with unspecified severity: Secondary | ICD-10-CM | POA: Insufficient documentation

## 2015-01-19 DIAGNOSIS — G510 Bell's palsy: Secondary | ICD-10-CM

## 2015-01-19 DIAGNOSIS — E785 Hyperlipidemia, unspecified: Secondary | ICD-10-CM

## 2015-01-19 DIAGNOSIS — E11621 Type 2 diabetes mellitus with foot ulcer: Secondary | ICD-10-CM | POA: Insufficient documentation

## 2015-01-19 LAB — POCT URINALYSIS DIP (DEVICE)
Bilirubin Urine: NEGATIVE
Glucose, UA: 500 mg/dL — AB
HGB URINE DIPSTICK: NEGATIVE
Ketones, ur: 40 mg/dL — AB
Leukocytes, UA: NEGATIVE
NITRITE: NEGATIVE
Protein, ur: NEGATIVE mg/dL
Specific Gravity, Urine: 1.015 (ref 1.005–1.030)
UROBILINOGEN UA: 0.2 mg/dL (ref 0.0–1.0)
pH: 5 (ref 5.0–8.0)

## 2015-01-19 LAB — COMPLETE METABOLIC PANEL WITH GFR
ALT: 31 U/L (ref 9–46)
AST: 19 U/L (ref 10–35)
Albumin: 5 g/dL (ref 3.6–5.1)
Alkaline Phosphatase: 58 U/L (ref 40–115)
BILIRUBIN TOTAL: 1 mg/dL (ref 0.2–1.2)
BUN: 20 mg/dL (ref 7–25)
CHLORIDE: 103 mmol/L (ref 98–110)
CO2: 17 mmol/L — AB (ref 20–31)
Calcium: 9.8 mg/dL (ref 8.6–10.3)
Creat: 0.6 mg/dL — ABNORMAL LOW (ref 0.70–1.33)
GFR, Est African American: >89 mL/min (ref >=60)
GLUCOSE: 229 mg/dL — AB (ref 65–99)
POTASSIUM: 5 mmol/L (ref 3.5–5.3)
SODIUM: 140 mmol/L (ref 135–146)
TOTAL PROTEIN: 7.8 g/dL (ref 6.1–8.1)

## 2015-01-19 LAB — LIPID PANEL
CHOL/HDL RATIO: 5.8 ratio — AB (ref <=5.0)
Cholesterol: 181 mg/dL (ref 125–200)
HDL: 31 mg/dL — AB (ref >=40)
LDL CALC: 104 mg/dL (ref <130)
Triglycerides: 229 mg/dL — ABNORMAL HIGH (ref <150)
VLDL: 46 mg/dL — AB (ref <30)

## 2015-01-19 LAB — GLUCOSE, CAPILLARY: Glucose-Capillary: 211 mg/dL — ABNORMAL HIGH (ref 65–99)

## 2015-01-19 LAB — HEMOGLOBIN A1C
Hgb A1c MFr Bld: 12 % — ABNORMAL HIGH (ref <5.7)
MEAN PLASMA GLUCOSE: 298 mg/dL — AB (ref <117)

## 2015-01-19 MED ORDER — CANAGLIFLOZIN 100 MG PO TABS
100.0000 mg | ORAL_TABLET | Freq: Every day | ORAL | Status: DC
Start: 2015-01-19 — End: 2015-05-17

## 2015-01-19 MED ORDER — METFORMIN HCL 1000 MG PO TABS: 1000.0000 mg | ORAL_TABLET | Freq: Two times a day (BID) | ORAL | Status: DC

## 2015-01-19 MED ORDER — PRAVASTATIN SODIUM 40 MG PO TABS: 40.0000 mg | ORAL_TABLET | Freq: Every day | ORAL | Status: DC

## 2015-01-19 MED ORDER — LISINOPRIL 10 MG PO TABS: 10.0000 mg | ORAL_TABLET | Freq: Every day | ORAL | Status: DC

## 2015-01-19 NOTE — Progress Notes (Signed)
Subjective:    Patient ID: Carl [name], male    DOB: 02-05-1966, 49 y.o.   MRN: [id number]  HPI Carl [name], a 49 year old male with a history of bell's palsy, hypertension and diabetes presents to establish care. Patient was recently evaluated in the emergency room on 01/12/2015 for Bell's Palsy and hyperglycemia. He was started on a regimen of prednisone for Bell's Palsy. While in the emergency department, a stroke was ruled out . He reports that he had been out of medications for diabetes and hypertension for 3 months due to insurance constraints.  He states that he has been taking medications consistently. He states that he is checking blood sugar at home, but did not bring to appointment.  Patient denies foot ulcerations, increase appetite, nausea, polydipsia, polyuria, visual disturbances, vomitting and weight loss.   Past Medical History  Diagnosis Date  . Diabetes mellitus without complication (HCC)   . Hypertension    Immunization History  Administered Date(s) Administered  . Influenza Split 11/30/2011  No Known Allergies  Social History   Social History  . Marital Status: Single    Spouse Name: N/A  . Number of Children: N/A  . Years of Education: N/A   Occupational History  . Not on file.   Social History Main Topics  . Smoking status: Former Smoker -- 0.50 packs/day for 2 years    Quit date: 10/05/1984  . Smokeless tobacco: Not on file  . Alcohol Use: Yes     Comment: occ  . Drug Use: No  . Sexual Activity: Yes   Other Topics Concern  . Not on file   Social History Narrative   Review of Systems  Constitutional: Positive for unexpected weight change. Negative for chills and fatigue.  HENT: Negative.  Negative for dental problem, drooling, trouble swallowing and voice change.   Eyes: Negative.  Negative for photophobia and visual disturbance.  Respiratory: Negative for cough, chest tightness and shortness of breath.   Cardiovascular: Negative.  Negative for  chest pain, palpitations and leg swelling.  Gastrointestinal: Negative.  Negative for abdominal pain, constipation and anal bleeding.  Endocrine: Negative for cold intolerance, polydipsia, polyphagia and polyuria.  Musculoskeletal: Negative.   Skin: Negative.   Allergic/Immunologic: Negative for immunocompromised state.  Neurological: Positive for facial asymmetry. Negative for dizziness and syncope.  Hematological: Negative.   Psychiatric/Behavioral: Negative.  Negative for suicidal ideas and sleep disturbance.       Objective:   Physical Exam  Constitutional: He is oriented to person, place, and time. He appears well-developed and well-nourished.  Obesity  HENT:  Head: Normocephalic and atraumatic.  Right Ear: Hearing, tympanic membrane, external ear and ear canal normal.  Left Ear: Hearing, tympanic membrane, external ear and ear canal normal.  Nose: Nose normal.  Mouth/Throat: Uvula is midline, oropharynx is clear and moist and mucous membranes are normal.  Eyes: Conjunctivae, EOM and lids are normal. Pupils are equal, round, and reactive to light.  Neck: Trachea normal, normal range of motion and full passive range of motion without pain. Neck supple.  Cardiovascular: Normal rate and regular rhythm.   Pulmonary/Chest: Effort normal and breath sounds normal. He has no decreased breath sounds.  Abdominal: Soft. Normal appearance and bowel sounds are normal.  Increased abdominal girth  Musculoskeletal: Normal range of motion.  Lymphadenopathy:       Head (right side): No submental and no submandibular adenopathy present.       Head (left side): No submental and  no submandibular adenopathy present.  Neurological: He is alert and oriented to person, place, and time. He has normal reflexes. No cranial nerve deficit or sensory deficit.  Right side facial assymetry  Skin: Skin is warm, dry and intact.  Psychiatric: He has a normal mood and affect. His behavior is normal. Judgment and  thought content normal.      BP 141/86 mmHg  Pulse 77  Temp(Src) 97.5 F (36.4 C) (Oral)  Resp 16  Ht 5\' 10"  (1.778 m)  Wt 257 lb (116.574 kg)  BMI 36.88 kg/m2  SpO2 97%  Assessment & Plan:  1. Uncontrolled type 2 diabetes mellitus without complication, without long-term current use of insulin (HCC) Reviewed labs, will continue current medications and follow up in 1 month. Reminded patient to bring glucometer to follow up appointment. Glucosuria present, but has improved slightly since hospital visit.  - canagliflozin (INVOKANA) 100 MG TABS tablet; Take 1 tablet (100 mg total) by mouth daily.  Dispense: 30 tablet; Refill: 2 - metFORMIN (GLUCOPHAGE) 1000 MG tablet; Take 1 tablet (1,000 mg total) by mouth 2 (two) times daily with a meal.  Dispense: 60 tablet; Refill: 2 - Hemoglobin A1c - COMPLETE METABOLIC PANEL WITH GFR - Microalbumin/Creatinine Ratio, Urine  2. Essential hypertension  Blood pressure is at goal on current medication regimen. The patient is asked to make an attempt to improve diet and exercise patterns to aid in medical management of this problem. - lisinopril (PRINIVIL,ZESTRIL) 10 MG tablet; Take 1 tablet (10 mg total) by mouth daily.  Dispense: 30 tablet; Refill: 2  3. Hyperlipidemia - pravastatin (PRAVACHOL) 40 MG tablet; Take 1 tablet (40 mg total) by mouth daily.  Dispense: 30 tablet; Refill: 2 - Lipid Panel  4. Right-sided Bell's palsy Complete course of prednisone as prescribed. Discussed wearing eye patch HS as needed.   5. Need for immunization against influenza - Flu Vaccine QUAD 36+ mos IM (Fluarix)    The patient was given clear instructions to go to ER or return to medical center if symptoms do not improve, worsen or new problems develop. The patient verbalized understanding. Will notify patient with laboratory results. RTC: 1 month for diabetes and hypertension Wilberta Dorvil M, FNP

## 2015-01-20 ENCOUNTER — Telehealth: Payer: Self-pay | Admitting: Family Medicine

## 2015-01-20 DIAGNOSIS — G510 Bell's palsy: Secondary | ICD-10-CM | POA: Insufficient documentation

## 2015-01-20 LAB — MICROALBUMIN / CREATININE URINE RATIO
CREATININE, URINE: 41 mg/dL (ref 20–370)
MICROALB/CREAT RATIO: 29 ug/mg{creat} (ref <30)
Microalb, Ur: 1.2 mg/dL

## 2015-01-20 NOTE — Patient Instructions (Signed)
DASH Eating Plan DASH stands for "Dietary Approaches to Stop Hypertension." The DASH eating plan is a healthy eating plan that has been shown to reduce high blood pressure (hypertension). Additional health benefits may include reducing the risk of type 2 diabetes mellitus, heart disease, and stroke. The DASH eating plan may also help with weight loss. WHAT DO I NEED TO KNOW ABOUT THE DASH EATING PLAN? For the DASH eating plan, you will follow these general guidelines:  Choose foods with a percent daily value for sodium of less than 5% (as listed on the food label).  Use salt-free seasonings or herbs instead of table salt or sea salt.  Check with your health care provider or pharmacist before using salt substitutes.  Eat lower-sodium products, often labeled as "lower sodium" or "no salt added."  Eat fresh foods.  Eat more vegetables, fruits, and low-fat dairy products.  Choose whole grains. Look for the word "whole" as the first word in the ingredient list.  Choose fish and skinless chicken or turkey more often than red meat. Limit fish, poultry, and meat to 6 oz (170 g) each day.  Limit sweets, desserts, sugars, and sugary drinks.  Choose heart-healthy fats.  Limit cheese to 1 oz (28 g) per day.  Eat more home-cooked food and less restaurant, buffet, and fast food.  Limit fried foods.  Cook foods using methods other than frying.  Limit canned vegetables. If you do use them, rinse them well to decrease the sodium.  When eating at a restaurant, ask that your food be prepared with less salt, or no salt if possible. WHAT FOODS CAN I EAT? Seek help from a dietitian for individual calorie needs. Grains Whole grain or whole wheat bread. Brown rice. Whole grain or whole wheat pasta. Quinoa, bulgur, and whole grain cereals. Low-sodium cereals. Corn or whole wheat flour tortillas. Whole grain cornbread. Whole grain crackers. Low-sodium crackers. Vegetables Fresh or frozen vegetables  (raw, steamed, roasted, or grilled). Low-sodium or reduced-sodium tomato and vegetable juices. Low-sodium or reduced-sodium tomato sauce and paste. Low-sodium or reduced-sodium canned vegetables.  Fruits All fresh, canned (in natural juice), or frozen fruits. Meat and Other Protein Products Ground beef (85% or leaner), grass-fed beef, or beef trimmed of fat. Skinless chicken or turkey. Ground chicken or turkey. Pork trimmed of fat. All fish and seafood. Eggs. Dried beans, peas, or lentils. Unsalted nuts and seeds. Unsalted canned beans. Dairy Low-fat dairy products, such as skim or 1% milk, 2% or reduced-fat cheeses, low-fat ricotta or cottage cheese, or plain low-fat yogurt. Low-sodium or reduced-sodium cheeses. Fats and Oils Tub margarines without trans fats. Light or reduced-fat mayonnaise and salad dressings (reduced sodium). Avocado. Safflower, olive, or canola oils. Natural peanut or almond butter. Other Unsalted popcorn and pretzels. The items listed above may not be a complete list of recommended foods or beverages. Contact your dietitian for more options. WHAT FOODS ARE NOT RECOMMENDED? Grains White bread. White pasta. White rice. Refined cornbread. Bagels and croissants. Crackers that contain trans fat. Vegetables Creamed or fried vegetables. Vegetables in a cheese sauce. Regular canned vegetables. Regular canned tomato sauce and paste. Regular tomato and vegetable juices. Fruits Dried fruits. Canned fruit in light or heavy syrup. Fruit juice. Meat and Other Protein Products Fatty cuts of meat. Ribs, chicken wings, bacon, sausage, bologna, salami, chitterlings, fatback, hot dogs, bratwurst, and packaged luncheon meats. Salted nuts and seeds. Canned beans with salt. Dairy Whole or 2% milk, cream, half-and-half, and cream cheese. Whole-fat or sweetened yogurt. Full-fat   cheeses or blue cheese. Nondairy creamers and whipped toppings. Processed cheese, cheese spreads, or cheese  curds. Condiments Onion and garlic salt, seasoned salt, table salt, and sea salt. Canned and packaged gravies. Worcestershire sauce. Tartar sauce. Barbecue sauce. Teriyaki sauce. Soy sauce, including reduced sodium. Steak sauce. Fish sauce. Oyster sauce. Cocktail sauce. Horseradish. Ketchup and mustard. Meat flavorings and tenderizers. Bouillon cubes. Hot sauce. Tabasco sauce. Marinades. Taco seasonings. Relishes. Fats and Oils Butter, stick margarine, lard, shortening, ghee, and bacon fat. Coconut, palm kernel, or palm oils. Regular salad dressings. Other Pickles and olives. Salted popcorn and pretzels. The items listed above may not be a complete list of foods and beverages to avoid. Contact your dietitian for more information. WHERE CAN I FIND MORE INFORMATION? National Heart, Lung, and Blood Institute: www.nhlbi.nih.gov/health/health-topics/topics/dash/   This information is not intended to replace advice given to you by your health care provider. Make sure you discuss any questions you have with your health care provider.   Document Released: 02/16/2011 Document Revised: 03/20/2014 Document Reviewed: 01/01/2013 Elsevier Interactive Patient Education 2016 Else[employer] Inc[employer] Fat and Cholesterol Restricted Diet High levels of fat and cholesterol in your blood may lead to various health problems, such as diseases of the heart, blood vessels, gallbladder, liver, and pancreas. Fats are concentrated sources of energy that come in various forms. Certain types of fat, including saturated fat, may be harmful in excess. Cholesterol is a substance needed by your body in small amounts. Your body makes all the cholesterol it needs. Excess cholesterol comes from the food you eat. When you have high levels of cholesterol and saturated fat in your blood, health problems can develop because the excess fat and cholesterol will gather along the walls of your blood vessels, causing them to narrow. Choosing the right  foods will help you control your intake of fat and cholesterol. This will help keep the levels of these substances in your blood within normal limits and reduce your risk of disease. WHAT IS MY PLAN? Your health care provider recommends that you:  Get no more than __________ % of the total calories in your daily diet from fat.  Limit your intake of saturated fat to less than ______% of your total calories each day.  Limit the amount of cholesterol in your diet to less than _________mg per day. WHAT TYPES OF FAT SHOULD I CHOOSE?  Choose healthy fats more often. Choose monounsaturated and polyunsaturated fats, such as olive and canola oil, flaxseeds, walnuts, almonds, and seeds.  Eat more omega-3 fats. Good choices include salmon, mackerel, sardines, tuna, flaxseed oil, and ground flaxseeds. Aim to eat fish at least two times a week.  Limit saturated fats. Saturated fats are primarily found in animal products, such as meats, butter, and cream. Plant sources of saturated fats include palm oil, palm kernel oil, and coconut oil.  Avoid foods with partially hydrogenated oils in them. These contain trans fats. Examples of foods that contain trans fats are stick margarine, some tub margarines, cookies, crackers, and other baked goods. WHAT GENERAL GUIDELINES DO I NEED TO FOLLOW? These guidelines for healthy eating will help you control your intake of fat and cholesterol:  Check food labels carefully to identify foods with trans fats or high amounts of saturated fat.  Fill one half of your plate with vegetables and green salads.  Fill one fourth of your plate with whole grains. Look for the word "whole" as the first word in the ingredient list.  Fill one fourth of your   plate with lean protein foods.  Limit fruit to two servings a day. Choose fruit instead of juice.  Eat more foods that contain soluble fiber. Examples of foods that contain this type of fiber are apples, broccoli, carrots, beans,  peas, and barley. Aim to get 20-30 g of fiber per day.  Eat more home-cooked food and less restaurant, buffet, and fast food.  Limit or avoid alcohol.  Limit foods high in starch and sugar.  Limit fried foods.  Cook foods using methods other than frying. Baking, boiling, grilling, and broiling are all great options.  Lose weight if you are overweight. Losing just 5-10% of your initial body weight can help your overall health and prevent diseases such as diabetes and heart disease. WHAT FOODS CAN I EAT? Grains Whole grains, such as whole wheat or whole grain breads, crackers, cereals, and pasta. Unsweetened oatmeal, bulgur, barley, quinoa, or brown rice. Corn or whole wheat flour tortillas. Vegetables Fresh or frozen vegetables (raw, steamed, roasted, or grilled). Green salads. Fruits All fresh, canned (in natural juice), or frozen fruits. Meat and Other Protein Products Ground beef (85% or leaner), grass-fed beef, or beef trimmed of fat. Skinless chicken or [place]. Ground chicken or [place]. Pork trimmed of fat. All fish and seafood. Eggs. Dried beans, peas, or lentils. Unsalted nuts or seeds. Unsalted canned or dry beans. Dairy Low-fat dairy products, such as skim or 1% milk, 2% or reduced-fat cheeses, low-fat ricotta or cottage cheese, or plain low-fat yogurt. Fats and Oils Tub margarines without trans fats. Light or reduced-fat mayonnaise and salad dressings. Avocado. Olive, canola, sesame, or safflower oils. Natural peanut or almond butter (choose ones without added sugar and oil). The items listed above may not be a complete list of recommended foods or beverages. Contact your dietitian for more options. WHAT FOODS ARE NOT RECOMMENDED? Grains White bread. White pasta. White rice. Cornbread. Bagels, pastries, and croissants. Crackers that contain trans fat. Vegetables White potatoes. Corn. Creamed or fried vegetables. Vegetables in a cheese sauce. Fruits Dried fruits. Canned fruit  in light or heavy syrup. Fruit juice. Meat and Other Protein Products Fatty cuts of meat. Ribs, chicken wings, bacon, sausage, bologna, salami, chitterlings, fatback, hot dogs, bratwurst, and packaged luncheon meats. Liver and organ meats. Dairy Whole or 2% milk, cream, half-and-half, and cream cheese. Whole milk cheeses. Whole-fat or sweetened yogurt. Full-fat cheeses. Nondairy creamers and whipped toppings. Processed cheese, cheese spreads, or cheese curds. Sweets and Desserts Corn syrup, sugars, honey, and molasses. Candy. Jam and jelly. Syrup. Sweetened cereals. Cookies, pies, cakes, donuts, muffins, and ice cream. Fats and Oils Butter, stick margarine, lard, shortening, ghee, or bacon fat. Coconut, palm kernel, or palm oils. Beverages Alcohol. Sweetened drinks (such as sodas, lemonade, and fruit drinks or punches). The items listed above may not be a complete list of foods and beverages to avoid. Contact your dietitian for more information.   This information is not intended to replace advice given to you by your health care provider. Make sure you discuss any questions you have with your health care provider.   Document Released: 02/27/2005 Document Revised: 03/20/2014 Document Reviewed: 05/28/2013 Elsevier Interactive Patient Education 2016 Elsevier Inc. Basic Carbohydrate Counting for Diabetes Mellitus Carbohydrate counting is a method for keeping track of the amount of carbohydrates you eat. Eating carbohydrates naturally increases the level of sugar (glucose) in your blood, so it is important for you to know the amount that is okay for you to have in every meal. Carbohydrate counting helps  keep the level of glucose in your blood within normal limits. The amount of carbohydrates allowed is different for every person. A dietitian can help you calculate the amount that is right for you. Once you know the amount of carbohydrates you can have, you can count the carbohydrates in the foods you  want to eat. Carbohydrates are found in the following foods:  Grains, such as breads and cereals.  Dried beans and soy products.  Starchy vegetables, such as potatoes, peas, and corn.  Fruit and fruit juices.  Milk and yogurt.  Sweets and snack foods, such as cake, cookies, candy, chips, soft drinks, and fruit drinks. CARBOHYDRATE COUNTING There are two ways to count the carbohydrates in your food. You can use either of the methods or a combination of both. Reading the "Nutrition Facts" on Packaged Food The "Nutrition Facts" is an area that is included on the labels of almost all packaged food and beverages in the [place]. It includes the serving size of that food or beverage and information about the nutrients in each serving of the food, including the grams (g) of carbohydrate per serving.  Decide the number of servings of this food or beverage that you will be able to eat or drink. Multiply that number of servings by the number of grams of carbohydrate that is listed on the label for that serving. The total will be the amount of carbohydrates you will be having when you eat or drink this food or beverage. Learning Standard Serving Sizes of Food When you eat food that is not packaged or does not include "Nutrition Facts" on the label, you need to measure the servings in order to count the amount of carbohydrates.A serving of most carbohydrate-rich foods contains about 15 g of carbohydrates. The following list includes serving sizes of carbohydrate-rich foods that provide 15 g ofcarbohydrate per serving:   1 slice of bread (1 oz) or 1 six-inch tortilla.    of a hamburger bun or English muffin.  4-6 crackers.   cup unsweetened dry cereal.    cup hot cereal.   cup rice or pasta.    cup mashed potatoes or  of a large baked potato.  1 cup fresh fruit or one small piece of fruit.    cup canned or frozen fruit or fruit juice.  1 cup milk.   cup plain fat-free  yogurt or yogurt sweetened with artificial sweeteners.   cup cooked dried beans or starchy vegetable, such as peas, corn, or potatoes.  Decide the number of standard-size servings that you will eat. Multiply that number of servings by 15 (the grams of carbohydrates in that serving). For example, if you eat 2 cups of strawberries, you will have eaten 2 servings and 30 g of carbohydrates (2 servings x 15 g = 30 g). For foods such as soups and casseroles, in which more than one food is mixed in, you will need to count the carbohydrates in each food that is included. EXAMPLE OF CARBOHYDRATE COUNTING Sample Dinner  3 oz chicken breast.   cup of brown rice.   cup of corn.  1 cup milk.   1 cup strawberries with sugar-free whipped topping.  Carbohydrate Calculation Step 1: Identify the foods that contain carbohydrates:   Rice.   Corn.   Milk.   Strawberries. Step 2:Calculate the number of servings eaten of each:   2 servings of rice.   1 serving of corn.   1 serving of milk.  1 serving of strawberries. Step 3: Multiply each of those number of servings by 15 g:   2 servings of rice x 15 g = 30 g.   1 serving of corn x 15 g = 15 g.   1 serving of milk x 15 g = 15 g.   1 serving of strawberries x 15 g = 15 g. Step 4: Add together all of the amounts to find the total grams of carbohydrates eaten: 30 g + 15 g + 15 g + 15 g = 75 g.   This information is not intended to replace advice given to you by your health care provider. Make sure you discuss any questions you have with your health care provider.   Document Released: 02/27/2005 Document Revised: 03/20/2014 Document Reviewed: 01/24/2013 Elsevier Interactive Patient Education [employer].

## 2015-01-20 NOTE — Telephone Encounter (Signed)
Reviewed labs. Triglycerides elevated, 229, goal is <150. Also, HDL cholesterol is 31, goal is >40. Hemoglobin A1c is 12%, goal is <7%.   ASCVD score is 26.5%, patient is on moderate dose statin therapy. Will continue at current dosage. Recommend a lowfat, low carbohydrate diet divided over 5-6 small meals, increase water intake to 6-8 glasses, and 150 minutes per week of cardiovascular exercise.    Will follow up in 1 month.    Massie MaroonHollis,Sirus Labrie M, FNP

## 2015-01-20 NOTE — Telephone Encounter (Signed)
Called and advised patient of lab results to continue medication as prescribed. To start low fat/ low carb diet, to divide meals into 5 to 6 small meals daily, increase water intake to 6 to 8 glasses daily, and to exercise 150 minutes a week of cardio. Patient verbally understands and will keep 1 month follow up appointment. Thanks!

## 2015-02-19 ENCOUNTER — Encounter: Payer: Self-pay | Admitting: Family Medicine

## 2015-02-19 ENCOUNTER — Ambulatory Visit (INDEPENDENT_AMBULATORY_CARE_PROVIDER_SITE_OTHER): Payer: Self-pay | Admitting: Family Medicine

## 2015-02-19 VITALS — BP 135/64 | HR 68 | Temp 97.7°F | Resp 16 | Ht 70.0 in | Wt 267.0 lb

## 2015-02-19 DIAGNOSIS — IMO0001 Reserved for inherently not codable concepts without codable children: Secondary | ICD-10-CM

## 2015-02-19 DIAGNOSIS — Z6841 Body Mass Index (BMI) 40.0 and over, adult: Secondary | ICD-10-CM

## 2015-02-19 DIAGNOSIS — E785 Hyperlipidemia, unspecified: Secondary | ICD-10-CM

## 2015-02-19 DIAGNOSIS — E1165 Type 2 diabetes mellitus with hyperglycemia: Secondary | ICD-10-CM

## 2015-02-19 DIAGNOSIS — I10 Essential (primary) hypertension: Secondary | ICD-10-CM

## 2015-02-19 LAB — POCT URINALYSIS DIP (DEVICE)
BILIRUBIN URINE: NEGATIVE
GLUCOSE, UA: 500 mg/dL — AB
Hgb urine dipstick: NEGATIVE
Leukocytes, UA: NEGATIVE
Nitrite: NEGATIVE
PH: 5.5 (ref 5.0–8.0)
Protein, ur: NEGATIVE mg/dL
Specific Gravity, Urine: 1.015 (ref 1.005–1.030)
Urobilinogen, UA: 0.2 mg/dL (ref 0.0–1.0)

## 2015-02-19 NOTE — Patient Instructions (Signed)
Diabetes and Exercise Exercising regularly is important. It is not just about losing weight. It has many health benefits, such as:  Improving your overall fitness, flexibility, and endurance.  Increasing your bone density.  Helping with weight control.  Decreasing your body fat.  Increasing your muscle strength.  Reducing stress and tension.  Improving your overall health. People with diabetes who exercise gain additional benefits because exercise:  Reduces appetite.  Improves the body's use of blood sugar (glucose).  Helps lower or control blood glucose.  Decreases blood pressure.  Helps control blood lipids (such as cholesterol and triglycerides).  Improves the body's use of the hormone insulin by:  Increasing the body's insulin sensitivity.  Reducing the body's insulin needs.  Decreases the risk for heart disease because exercising:  Lowers cholesterol and triglycerides levels.  Increases the levels of good cholesterol (such as high-density lipoproteins [HDL]) in the body.  Lowers blood glucose levels. YOUR ACTIVITY PLAN  Choose an activity that you enjoy, and set realistic goals. To exercise safely, you should begin practicing any new physical activity slowly, and gradually increase the intensity of the exercise over time. Your health care provider or diabetes educator can help create an activity plan that works for you. General recommendations include:  Encouraging children to engage in at least 60 minutes of physical activity each day.  Stretching and performing strength training exercises, such as yoga or weight lifting, at least 2 times per week.  Performing a total of at least 150 minutes of moderate-intensity exercise each week, such as brisk walking or water aerobics.  Exercising at least 3 days per week, making sure you allow no more than 2 consecutive days to pass without exercising.  Avoiding long periods of inactivity (90 minutes or more). When you  have to spend an extended period of time sitting down, take frequent breaks to walk or stretch. RECOMMENDATIONS FOR EXERCISING WITH TYPE 1 OR TYPE 2 DIABETES   Check your blood glucose before exercising. If blood glucose levels are greater than 240 mg/dL, check for urine ketones. Do not exercise if ketones are present.  Avoid injecting insulin into areas of the body that are going to be exercised. For example, avoid injecting insulin into:  The arms when playing tennis.  The legs when jogging.  Keep a record of:  Food intake before and after you exercise.  Expected peak times of insulin action.  Blood glucose levels before and after you exercise.  The type and amount of exercise you have done.  Review your records with your health care provider. Your health care provider will help you to develop guidelines for adjusting food intake and insulin amounts before and after exercising.  If you take insulin or oral hypoglycemic agents, watch for signs and symptoms of hypoglycemia. They include:  Dizziness.  Shaking.  Sweating.  Chills.  Confusion.  Drink plenty of water while you exercise to prevent dehydration or heat stroke. Body water is lost during exercise and must be replaced.  Talk to your health care provider before starting an exercise program to make sure it is safe for you. Remember, almost any type of activity is better than none.   This information is not intended to replace advice given to you by your health care provider. Make sure you discuss any questions you have with your health care provider.   Document Released: 05/20/2003 Document Revised: 07/14/2014 Document Reviewed: 08/06/2012 Elsevier Interactive Patient Education 2016 [employer] Fat and Cholesterol Restricted Diet High levels of  fat and cholesterol in your blood may lead to various health problems, such as diseases of the heart, blood vessels, gallbladder, liver, and pancreas. Fats are concentrated  sources of energy that come in various forms. Certain types of fat, including saturated fat, may be harmful in excess. Cholesterol is a substance needed by your body in small amounts. Your body makes all the cholesterol it needs. Excess cholesterol comes from the food you eat. When you have high levels of cholesterol and saturated fat in your blood, health problems can develop because the excess fat and cholesterol will gather along the walls of your blood vessels, causing them to narrow. Choosing the right foods will help you control your intake of fat and cholesterol. This will help keep the levels of these substances in your blood within normal limits and reduce your risk of disease. WHAT IS MY PLAN? Your health care provider recommends that you:  Get no more than __________ % of the total calories in your daily diet from fat.  Limit your intake of saturated fat to less than ______% of your total calories each day.  Limit the amount of cholesterol in your diet to less than _________mg per day. WHAT TYPES OF FAT SHOULD I CHOOSE?  Choose healthy fats more often. Choose monounsaturated and polyunsaturated fats, such as olive and canola oil, flaxseeds, walnuts, almonds, and seeds.  Eat more omega-3 fats. Good choices include salmon, mackerel, sardines, tuna, flaxseed oil, and ground flaxseeds. Aim to eat fish at least two times a week.  Limit saturated fats. Saturated fats are primarily found in animal products, such as meats, butter, and cream. Plant sources of saturated fats include palm oil, palm kernel oil, and coconut oil.  Avoid foods with partially hydrogenated oils in them. These contain trans fats. Examples of foods that contain trans fats are stick margarine, some tub margarines, cookies, crackers, and other baked goods. WHAT GENERAL GUIDELINES DO I NEED TO FOLLOW? These guidelines for healthy eating will help you control your intake of fat and cholesterol:  Check food labels carefully  to identify foods with trans fats or high amounts of saturated fat.  Fill one half of your plate with vegetables and green salads.  Fill one fourth of your plate with whole grains. Look for the word "whole" as the first word in the ingredient list.  Fill one fourth of your plate with lean protein foods.  Limit fruit to two servings a day. Choose fruit instead of juice.  Eat more foods that contain soluble fiber. Examples of foods that contain this type of fiber are apples, broccoli, carrots, beans, peas, and barley. Aim to get 20-30 g of fiber per day.  Eat more home-cooked food and less restaurant, buffet, and fast food.  Limit or avoid alcohol.  Limit foods high in starch and sugar.  Limit fried foods.  Cook foods using methods other than frying. Baking, boiling, grilling, and broiling are all great options.  Lose weight if you are overweight. Losing just 5-10% of your initial body weight can help your overall health and prevent diseases such as diabetes and heart disease. WHAT FOODS CAN I EAT? Grains Whole grains, such as whole wheat or whole grain breads, crackers, cereals, and pasta. Unsweetened oatmeal, bulgur, barley, quinoa, or brown rice. Corn or whole wheat flour tortillas. Vegetables Fresh or frozen vegetables (raw, steamed, roasted, or grilled). Green salads. Fruits All fresh, canned (in natural juice), or frozen fruits. Meat and Other Protein Products Ground beef (85% or  leaner), grass-fed beef, or beef trimmed of fat. Skinless chicken or [place]. Ground chicken or [place]. Pork trimmed of fat. All fish and seafood. Eggs. Dried beans, peas, or lentils. Unsalted nuts or seeds. Unsalted canned or dry beans. Dairy Low-fat dairy products, such as skim or 1% milk, 2% or reduced-fat cheeses, low-fat ricotta or cottage cheese, or plain low-fat yogurt. Fats and Oils Tub margarines without trans fats. Light or reduced-fat mayonnaise and salad dressings. Avocado. Olive, canola,  sesame, or safflower oils. Natural peanut or almond butter (choose ones without added sugar and oil). The items listed above may not be a complete list of recommended foods or beverages. Contact your dietitian for more options. WHAT FOODS ARE NOT RECOMMENDED? Grains White bread. White pasta. White rice. Cornbread. Bagels, pastries, and croissants. Crackers that contain trans fat. Vegetables White potatoes. Corn. Creamed or fried vegetables. Vegetables in a cheese sauce. Fruits Dried fruits. Canned fruit in light or heavy syrup. Fruit juice. Meat and Other Protein Products Fatty cuts of meat. Ribs, chicken wings, bacon, sausage, bologna, salami, chitterlings, fatback, hot dogs, bratwurst, and packaged luncheon meats. Liver and organ meats. Dairy Whole or 2% milk, cream, half-and-half, and cream cheese. Whole milk cheeses. Whole-fat or sweetened yogurt. Full-fat cheeses. Nondairy creamers and whipped toppings. Processed cheese, cheese spreads, or cheese curds. Sweets and Desserts Corn syrup, sugars, honey, and molasses. Candy. Jam and jelly. Syrup. Sweetened cereals. Cookies, pies, cakes, donuts, muffins, and ice cream. Fats and Oils Butter, stick margarine, lard, shortening, ghee, or bacon fat. Coconut, palm kernel, or palm oils. Beverages Alcohol. Sweetened drinks (such as sodas, lemonade, and fruit drinks or punches). The items listed above may not be a complete list of foods and beverages to avoid. Contact your dietitian for more information.   This information is not intended to replace advice given to you by your health care provider. Make sure you discuss any questions you have with your health care provider.   Document Released: 02/27/2005 Document Revised: 03/20/2014 Document Reviewed: 05/28/2013 Elsevier Interactive Patient Education [employer]

## 2015-02-19 NOTE — Progress Notes (Signed)
Subjective:    Patient ID: Carl [name], male    DOB: 1965/04/19, 49 y.o.   MRN: [id number] Mr. Mady HaagensenGary Kotter, a 49 year old male with a history of bell's palsy, hypertension and diabetes presents for a 1 month follow up after starting medications.  Hypertension This is a chronic problem. The current episode started more than 1 year ago. The problem has been resolved since onset. Pertinent negatives include no anxiety, blurred vision, chest pain, headaches, malaise/fatigue, neck pain, orthopnea, palpitations, peripheral edema, PND, shortness of breath or sweats. There are no associated agents to hypertension. Risk factors for coronary artery disease include diabetes mellitus, dyslipidemia, obesity and sedentary lifestyle. Past treatments include ACE inhibitors. The current treatment provides significant improvement. There is no history of CVA. There is no history of sleep apnea.  Diabetes He presents for his follow-up diabetic visit. He has type 2 diabetes mellitus. His disease course has been improving. Pertinent negatives for hypoglycemia include no dizziness, headaches or sweats. Pertinent negatives for diabetes include no blurred vision, no chest pain, no fatigue, no polydipsia, no polyphagia and no polyuria. Pertinent negatives for hypoglycemia complications include no blackouts, no hospitalization and no required glucagon injection. Symptoms are stable. Pertinent negatives for diabetic complications include no CVA or heart disease. Risk factors for coronary artery disease include dyslipidemia. Current diabetic treatment includes diet and oral agent (dual therapy). He is compliant with treatment all of the time. His home blood glucose trend is decreasing steadily.    Past Medical History  Diagnosis Date  . Diabetes mellitus without complication (HCC)   . Hypertension    Immunization History  Administered Date(s) Administered  . Influenza Split 11/30/2011  . Influenza,inj,Quad PF,36+ Mos  01/19/2015  No Known Allergies  Social History   Social History  . Marital Status: Single    Spouse Name: N/A  . Number of Children: N/A  . Years of Education: N/A   Occupational History  . Not on file.   Social History Main Topics  . Smoking status: Former Smoker -- 0.50 packs/day for 2 years    Quit date: 10/05/1984  . Smokeless tobacco: Not on file  . Alcohol Use: Yes     Comment: occ  . Drug Use: No  . Sexual Activity: Yes   Other Topics Concern  . Not on file   Social History Narrative   Review of Systems  Constitutional: Positive for unexpected weight change. Negative for chills, malaise/fatigue and fatigue.  HENT: Negative for dental problem, drooling, trouble swallowing and voice change.   Eyes: Negative.  Negative for blurred vision, photophobia and visual disturbance.  Respiratory: Negative for cough, chest tightness and shortness of breath.   Cardiovascular: Negative.  Negative for chest pain, palpitations, orthopnea, leg swelling and PND.  Gastrointestinal: Negative.  Negative for abdominal pain, constipation and anal bleeding.  Endocrine: Negative for cold intolerance, polydipsia, polyphagia and polyuria.  Musculoskeletal: Negative.  Negative for neck pain.  Skin: Negative.   Allergic/Immunologic: Negative for immunocompromised state.  Neurological: Positive for facial asymmetry. Negative for dizziness, syncope and headaches.  Hematological: Negative.   Psychiatric/Behavioral: Negative.  Negative for suicidal ideas and sleep disturbance.       Objective:   Physical Exam  Constitutional: He is oriented to person, place, and time. He appears well-developed and well-nourished.  Obesity  HENT:  Head: Normocephalic and atraumatic.  Right Ear: Hearing, tympanic membrane, external ear and ear canal normal.  Left Ear: Hearing, tympanic membrane, external ear and ear canal  normal.  Nose: Nose normal.  Mouth/Throat: Uvula is midline, oropharynx is clear and  moist and mucous membranes are normal.  Eyes: Conjunctivae, EOM and lids are normal. Pupils are equal, round, and reactive to light.  Neck: Trachea normal, normal range of motion and full passive range of motion without pain. Neck supple.  Cardiovascular: Normal rate and regular rhythm.   Pulmonary/Chest: Effort normal and breath sounds normal. He has no decreased breath sounds.  Abdominal: Soft. Normal appearance and bowel sounds are normal.  Increased abdominal girth  Musculoskeletal: Normal range of motion.  Lymphadenopathy:       Head (right side): No submental and no submandibular adenopathy present.       Head (left side): No submental and no submandibular adenopathy present.  Neurological: He is alert and oriented to person, place, and time. He has normal reflexes. No cranial nerve deficit or sensory deficit.  Right side facial assymetry  Skin: Skin is warm, dry and intact.  Psychiatric: He has a normal mood and affect. His behavior is normal. Judgment and thought content normal.      BP 135/64 mmHg  Pulse 68  Temp(Src) 97.7 F (36.5 C) (Oral)  Resp 16  Ht  (1.778 m)  Wt 267 lb (121.11 kg)  BMI 38.31 kg/m2  Assessment & Plan:   1. Essential hypertension Blood pressure at goal on current medication regimen. Will continue medication at current dosages. Reviewed urinalysis, no proteinuria present. The patient is asked to make an attempt to improve diet and exercise patterns to aid in medical management of this problem. - POCT urinalysis dipstick  2. Uncontrolled type 2 diabetes mellitus without complication, without long-term current use of insulin [facility]) Patient states that he has been consistently taking anti-diabetic medications. He denies having symptoms of hyperglycemia or hyperglycemia. Blood sugars have ranged between 120-170 in am. He states that he has modified diet.  3. Hyperlipidemia Will continue statin therapy. Also, continue lowfat, low sodium diet.    4.  BMI 40.0-44.9, adult (HCC) Recommend a lowfat, low carbohydrate diet divided over 5-6 small meals, increase water intake to 6-8 glasses, and 150 minutes per week of cardiovascular exercise.      The patient was given clear instructions to go to ER or return to medical center if symptoms do not improve, worsen or new problems develop. The patient verbalized understanding. Will notify patient with laboratory results.  RTC: 1 month for diabetes and hypertension Brieonna Crutcher M, FNP

## 2015-04-16 ENCOUNTER — Other Ambulatory Visit: Payer: Self-pay | Admitting: Family Medicine

## 2015-04-16 ENCOUNTER — Other Ambulatory Visit (INDEPENDENT_AMBULATORY_CARE_PROVIDER_SITE_OTHER): Payer: Self-pay

## 2015-04-16 DIAGNOSIS — E119 Type 2 diabetes mellitus without complications: Secondary | ICD-10-CM

## 2015-04-16 DIAGNOSIS — I10 Essential (primary) hypertension: Secondary | ICD-10-CM

## 2015-04-16 DIAGNOSIS — E785 Hyperlipidemia, unspecified: Secondary | ICD-10-CM

## 2015-04-16 LAB — COMPLETE METABOLIC PANEL WITH GFR
ALBUMIN: 4.5 g/dL (ref 3.6–5.1)
ALK PHOS: 55 U/L (ref 40–115)
ALT: 29 U/L (ref 9–46)
AST: 20 U/L (ref 10–35)
BILIRUBIN TOTAL: 0.7 mg/dL (ref 0.2–1.2)
BUN: 13 mg/dL (ref 7–25)
CO2: 25 mmol/L (ref 20–31)
Calcium: 9.5 mg/dL (ref 8.6–10.3)
Chloride: 99 mmol/L (ref 98–110)
Creat: 0.61 mg/dL — ABNORMAL LOW (ref 0.70–1.33)
Glucose, Bld: 236 mg/dL — ABNORMAL HIGH (ref 65–99)
POTASSIUM: 4.4 mmol/L (ref 3.5–5.3)
Sodium: 135 mmol/L (ref 135–146)
TOTAL PROTEIN: 6.8 g/dL (ref 6.1–8.1)

## 2015-04-16 LAB — LIPID PANEL
Cholesterol: 196 mg/dL (ref 125–200)
HDL: 35 mg/dL — AB (ref >=40)
LDL Cholesterol: 131 mg/dL — ABNORMAL HIGH (ref <130)
TRIGLYCERIDES: 152 mg/dL — AB (ref <150)
Total CHOL/HDL Ratio: 5.6 Ratio — ABNORMAL HIGH (ref <=5.0)
VLDL: 30 mg/dL (ref <30)

## 2015-04-16 LAB — HEMOGLOBIN A1C
Hgb A1c MFr Bld: 9.4 % — ABNORMAL HIGH (ref <5.7)
MEAN PLASMA GLUCOSE: 223 mg/dL — AB (ref <117)

## 2015-04-19 ENCOUNTER — Telehealth: Payer: Self-pay

## 2015-04-19 NOTE — Telephone Encounter (Signed)
Left a message for patient to return call regarding labs.Thanks!

## 2015-04-19 NOTE — Telephone Encounter (Signed)
-----   Message from Massie Maroon, [place] sent at 04/18/2015  2:34 PM EST ----- Regarding: lab results Please inform Carl [name] that hemoglobin a1C has decreased from 12.1 to 9.4 and overall cholesterol is trending down. Will continue current medication regimen. Please follow up in office as scheduled.   Thanks ----- Message -----    From: [facility]    Sent: 04/16/2015   8:09 PM      To: Massie Maroon, FNP

## 2015-04-20 NOTE — Telephone Encounter (Signed)
Tried to call, no answer. Left message for patient to call back regarding labs. Thanks!  

## 2015-04-21 NOTE — Telephone Encounter (Signed)
Have not been able to contact patient by phone, I will send letter. Thanks!

## 2015-05-17 ENCOUNTER — Encounter: Payer: Self-pay | Admitting: Family Medicine

## 2015-05-17 ENCOUNTER — Ambulatory Visit (INDEPENDENT_AMBULATORY_CARE_PROVIDER_SITE_OTHER): Payer: Self-pay | Admitting: Family Medicine

## 2015-05-17 VITALS — BP 129/76 | HR 66 | Temp 98.5°F | Resp 16 | Ht 70.0 in | Wt 268.0 lb

## 2015-05-17 DIAGNOSIS — E785 Hyperlipidemia, unspecified: Secondary | ICD-10-CM

## 2015-05-17 DIAGNOSIS — E119 Type 2 diabetes mellitus without complications: Secondary | ICD-10-CM

## 2015-05-17 DIAGNOSIS — Z6841 Body Mass Index (BMI) 40.0 and over, adult: Secondary | ICD-10-CM

## 2015-05-17 DIAGNOSIS — I10 Essential (primary) hypertension: Secondary | ICD-10-CM

## 2015-05-17 LAB — POCT URINALYSIS DIP (DEVICE)
BILIRUBIN URINE: NEGATIVE
Glucose, UA: 500 mg/dL — AB
HGB URINE DIPSTICK: NEGATIVE
Ketones, ur: NEGATIVE mg/dL
Leukocytes, UA: NEGATIVE
Nitrite: NEGATIVE
Protein, ur: NEGATIVE mg/dL
Urobilinogen, UA: 0.2 mg/dL (ref 0.0–1.0)
pH: 5.5 (ref 5.0–8.0)

## 2015-05-17 LAB — GLUCOSE, CAPILLARY: Glucose-Capillary: 226 mg/dL — ABNORMAL HIGH (ref 65–99)

## 2015-05-17 MED ORDER — PRAVASTATIN SODIUM 40 MG PO TABS: 40.0000 mg | ORAL_TABLET | Freq: Every day | ORAL | Status: DC

## 2015-05-17 MED ORDER — LISINOPRIL 10 MG PO TABS: 10.0000 mg | ORAL_TABLET | Freq: Every day | ORAL | Status: DC

## 2015-05-17 MED ORDER — CANAGLIFLOZIN 100 MG PO TABS: 100.0000 mg | ORAL_TABLET | Freq: Every day | ORAL | Status: DC

## 2015-05-17 MED ORDER — METFORMIN HCL 1000 MG PO TABS: 1000.0000 mg | ORAL_TABLET | Freq: Two times a day (BID) | ORAL | Status: DC

## 2015-05-17 NOTE — Patient Instructions (Signed)
Diabetes and Exercise Diabetes mellitus is a common, chronic disease in which a person's blood sugar (glucose) levels are often too high. Getting exercise on a regular basis is an important part of diabetes treatment. WHY SHOULD I EXERCISE REGULARLY IF I HAVE DIABETES? Exercising regularly provides many benefits for people who have diabetes. Some of these benefits include:  Lowering your blood glucose level.  Maintaining a healthy body weight and structure.  Reducing your risk of heart disease by:  Lowering your LDL cholesterol levels. LDL is sometimes called bad cholesterol.  Lowering your triglyceride levels.  Raising your HDL cholesterol levels. HDL is sometimes called good cholesterol.  Decreasing your blood pressure.  Lowering your hemoglobin A1C levels. A1C is a blood test that determines your average blood glucose level over a 25-month period. Doing resistance exercises on a regular basis can help to lower these levels.  Improving your body's ability to use insulin and glucose. WHAT TYPES OF EXERCISE ARE RECOMMENDED FOR PEOPLE WHO HAVE DIABETES? A combination of resistance exercises and aerobic exercise is recommended for people who have diabetes. Resistance exercises are those that you do with free weights or weight machines. Aerobic exercise includes any exercise that raises your heart rate and breathing rate for a sustained amount of time. This can include activities such as:  Brisk walking.  Water or dry-land aerobics.  Bicycling or riding a stationary bike.  Jogging.  Dancing.  Hiking.  Moderate to vigorous gardening. Ask your health care provider what types of exercise are safe for you. HOW LONG AND HOW OFTEN SHOULD I EXERCISE? The American Diabetes Association and the [place]. Department of Health recommend the following:  Children who have diabetes or are at risk of developing diabetes (have prediabetes) should get 1 hour or more of exercise every day.  Adults  who have diabetes should exercise with moderate intensity for 150 minutes or more per week. Moderate-intensity exercise is any exercise that raises your heart rate to 50-70% of your maximum heart rate. Ask your health care provider how to calculate this.  Adults who have diabetes should spread exercise over at least 3 days per week, but they should not go more than 2 days in a row without exercising.  Adults who have diabetes should do resistance exercise at least 2 days per week.  Adults who have diabetes should avoid sitting for more than 90 minutes at a time. WHAT SHOULD I DO BEFORE I START AN EXERCISE PROGRAM?  Talk with your health care provider before you start a new exercise program. If you have, or are at risk for, certain medical conditions, you may need to have a physical exam and testing. Testing may include:  A chest X-ray.  An electrocardiogram (ECG). This test checks the electrical functioning of your heart.  Blood tests.  If you have type 1 diabetes, talk with your health care provider about managing your blood glucose levels with insulin before, during, and after exercise.  Understand that exercise affects blood glucose levels differently depending on how long you exercise and depending on other factors such as whether you are sick. Be ready to treat high blood glucose (hyperglycemia) and low blood glucose (hypoglycemia) right away if either occurs during or after exercise.  Talk with your health care provider about diabetes-related problems that can occur during exercise. Your health care provider can help you to decide on an exercise plan that allows you to avoid these problems. This plan might include instructions about choosing the proper footwear for  exercise and staying well hydrated during and after exercise. HOW CAN I MANAGE MY BLOOD GLUCOSE LEVEL WHILE I EXERCISE?  Eat 1-3 hours before you exercise.  Check your blood glucose level immediately before and after  exercise.  Do not start exercising if:  Your blood glucose is more than 250 mg/dL andyou do not feel well.  You have ketones in your urine.  Your blood glucose is less than 100 mg/dL.  If you do not feel well while exercising, take a break and check your blood glucose.  Stop exercising if you find that your blood glucose has dropped below 100 mg/dL. If this occurs, do the following:  Eat a 15-gram to 20-gram carbohydrate-rich snack.  Recheck your blood glucose level.  If your blood glucose level does not rise above 100 mg/dL, have another 09-[id number]-gram to 20-gram carbohydrate snack.  Stop exercising if youfindthat your blood glucose has risen above 250 mg/dL while exercising.  Do not go on with your workout until:  Your blood glucose level rises above 100 mg/dL if it was below this level.  Your blood glucose level drops below 250 mg/dL if it was above this level.  If you take insulin, you may need to alter your insulin schedule on the days that you exercise. This depends on how your blood glucose responds to exercise. Ask your health care provider how to do this.  Drink fluids during and after exercise to avoid dehydration. For exercise that lasts a long time, use a sports drink to maintain your glucose level. SEEK MEDICAL CARE IF:  You have stopped exercising because of hypoglycemia and your blood glucose does not rise above 100 mg/dL after you have two 14-[id number]-gram to 20-gram carbohydrate-rich snacks.  You notice a loss of sensation in your feet during or after exercise.  You have increased numbness, tingling, or pins-and-needles sensations after exercise.  You have a fast, irregular heartbeat (palpitations) during or after exercise.  Your exercise tolerance gets worse.  You have dizzy spells or feel faint for brief periods during or after exercise. SEEK IMMEDIATE MEDICAL CARE IF:  You have chest pain during or after exercise.  You pass out (lose consciousness) during or  after exercise.  You have the following in addition to hyperglycemia:  Fatigue.  Dry or flushed skin.  Difficulty breathing.  Confusion.  Nausea, vomiting, or pain in the abdomen.  Fruity-smelling breath.   This information is not intended to replace advice given to you by your health care provider. Make sure you discuss any questions you have with your health care provider.   Document Released: 02/27/2005 Document Revised: 11/18/2014 Document Reviewed: 04/28/2014 Elsevier Interactive Patient Education [employer]

## 2015-05-17 NOTE — Progress Notes (Signed)
Subjective:    Patient ID: Carl [name], male    DOB: [birth year], 50 y.o.   MRN: [id number] [name], a 50 year old male with a history of bell's palsy, hypertension and diabetes presents for a 3 month follow up of chronic conditions.  Hypertension This is a chronic problem. The current episode started more than 1 year ago. The problem has been resolved since onset. Pertinent negatives include no anxiety, blurred vision, chest pain, headaches, malaise/fatigue, neck pain, orthopnea, palpitations, peripheral edema, PND, shortness of breath or sweats. There are no associated agents to hypertension. Risk factors for coronary artery disease include diabetes mellitus, dyslipidemia, obesity and sedentary lifestyle. Past treatments include ACE inhibitors. The current treatment provides significant improvement. There is no history of CVA. There is no history of sleep apnea.  Diabetes He presents for his follow-up diabetic visit. He has type 2 diabetes mellitus. His disease course has been improving. Pertinent negatives for hypoglycemia include no dizziness, headaches or sweats. Pertinent negatives for diabetes include no blurred vision, no chest pain, no fatigue, no polydipsia, no polyphagia and no polyuria. Pertinent negatives for hypoglycemia complications include no blackouts, no hospitalization and no required glucagon injection. Symptoms are stable. Pertinent negatives for diabetic complications include no CVA or heart disease. Risk factors for coronary artery disease include dyslipidemia. Current diabetic treatment includes diet and oral agent (dual therapy). He is compliant with treatment all of the time. His home blood glucose trend is decreasing steadily.    Past Medical History  Diagnosis Date  . Diabetes mellitus without complication (HCC)   . Hypertension    Immunization History  Administered Date(s) Administered  . Influenza Split 11/30/2011  . Influenza,inj,Quad PF,36+ Mos 01/19/2015   No Known Allergies  Social History   Social History  . Marital Status: Single    Spouse Name: N/A  . Number of Children: N/A  . Years of Education: N/A   Occupational History  . Not on file.   Social History Main Topics  . Smoking status: Former Smoker -- 0.50 packs/day for 2 years    Quit date: 10/05/1984  . Smokeless tobacco: Not on file  . Alcohol Use: Yes     Comment: occ  . Drug Use: No  . Sexual Activity: Yes   Other Topics Concern  . Not on file   Social History Narrative   Review of Systems  Constitutional: Negative for chills, malaise/fatigue and fatigue.  HENT: Negative for dental problem, drooling, trouble swallowing and voice change.   Eyes: Negative.  Negative for blurred vision, photophobia and visual disturbance.  Respiratory: Negative for cough, chest tightness and shortness of breath.   Cardiovascular: Negative.  Negative for chest pain, palpitations, orthopnea, leg swelling and PND.  Gastrointestinal: Negative.  Negative for abdominal pain, constipation and anal bleeding.  Endocrine: Negative for cold intolerance, polydipsia, polyphagia and polyuria.  Musculoskeletal: Negative.  Negative for neck pain.  Skin: Negative.   Allergic/Immunologic: Negative for immunocompromised state.  Neurological: Positive for facial asymmetry. Negative for dizziness, syncope and headaches.  Hematological: Negative.   Psychiatric/Behavioral: Negative.  Negative for suicidal ideas and sleep disturbance.       Objective:   Physical Exam  Constitutional: He is oriented to person, place, and time. He appears well-developed and well-nourished.  Obesity  HENT:  Head: Normocephalic and atraumatic.  Right Ear: Hearing, tympanic membrane, external ear and ear canal normal.  Left Ear: Hearing, tympanic membrane, external ear and ear canal normal.  Nose: Nose normal.  Mouth/Throat: Uvula is midline, oropharynx is clear and moist and mucous membranes are normal.  Eyes:  Conjunctivae, EOM and lids are normal. Pupils are equal, round, and reactive to light.  Neck: Trachea normal, normal range of motion and full passive range of motion without pain. Neck supple.  Cardiovascular: Normal rate and regular rhythm.   Pulmonary/Chest: Effort normal and breath sounds normal. He has no decreased breath sounds.  Abdominal: Soft. Normal appearance and bowel sounds are normal.  Increased abdominal girth  Musculoskeletal: Normal range of motion.  Lymphadenopathy:       Head (right side): No submental and no submandibular adenopathy present.       Head (left side): No submental and no submandibular adenopathy present.  Neurological: He is alert and oriented to person, place, and time. He has normal reflexes. No cranial nerve deficit or sensory deficit.  Right side facial asymetry  Monofilament exam negative  Skin: Skin is warm, dry and intact.  Psychiatric: He has a normal mood and affect. His behavior is normal. Judgment and thought content normal.      BP 129/76 mmHg  Pulse 66  Temp(Src) 98.5 F (36.9 C) (Oral)  Resp 16  Ht 5\' 10"  (1.778 m)  Wt 268 lb (121.564 kg)  BMI 38.45 kg/m2  Assessment & Plan:   1. Controlled type 2 diabetes mellitus without complication, without long-term current use of insulin [facility](HCC) Patient states that he has been consistently taking anti-diabetic medications. He denies having symptoms of hyperglycemia or hyperglycemia. Blood sugars have ranged between 120-180 in am. Monofilament examination negative. Reviewed hemoglobin a1C has decreased from 12.0 to 9.4 %.  - Glucose, capillary - metFORMIN (GLUCOPHAGE) 1000 MG tablet; Take 1 tablet (1,000 mg total) by mouth 2 (two) times daily with a meal.  Dispense: 60 tablet; Refill: 5 - canagliflozin (INVOKANA) 100 MG TABS tablet; Take 1 tablet (100 mg total) by mouth daily.  Dispense: 30 tablet; Refill: 5 - POCT urinalysis dip (device)  2. Essential hypertension Blood pressure at goal on current  medication regimen. Will continue medication at current dosages. Reviewed urinalysis, no proteinuria present. The patient is asked to make an attempt to improve diet and exercise patterns to aid in medical management of this problem.  - lisinopril (PRINIVIL,ZESTRIL) 10 MG tablet; Take 1 tablet (10 mg total) by mouth daily.  Dispense: 30 tablet; Refill: 5 - POCT urinalysis dip (device)  3. Hyperlipidemia  - pravastatin (PRAVACHOL) 40 MG tablet; Take 1 tablet (40 mg total) by mouth daily.  Dispense: 30 tablet; Refill: 5  4. BMI 40.0-44.9, adult (HCC) Recommend a lowfat, low carbohydrate diet divided over 5-6 small meals, increase water intake to 6-8 glasses, and 150 minutes per week of cardiovascular exercise.     The patient was given clear instructions to go to ER or return to medical center if symptoms do not improve, worsen or new problems develop. The patient verbalized understanding. Will notify patient with laboratory results.  RTC: 3 months  for diabetes and hypertension Reuben Knoblock M, FNP

## 2015-08-17 ENCOUNTER — Ambulatory Visit (INDEPENDENT_AMBULATORY_CARE_PROVIDER_SITE_OTHER): Payer: Self-pay | Admitting: Family Medicine

## 2015-08-17 ENCOUNTER — Encounter: Payer: Self-pay | Admitting: Family Medicine

## 2015-08-17 VITALS — BP 128/85 | HR 59 | Temp 98.2°F | Resp 16 | Ht 70.0 in | Wt 268.0 lb

## 2015-08-17 DIAGNOSIS — I10 Essential (primary) hypertension: Secondary | ICD-10-CM

## 2015-08-17 DIAGNOSIS — E785 Hyperlipidemia, unspecified: Secondary | ICD-10-CM

## 2015-08-17 DIAGNOSIS — Z6841 Body Mass Index (BMI) 40.0 and over, adult: Secondary | ICD-10-CM

## 2015-08-17 DIAGNOSIS — E119 Type 2 diabetes mellitus without complications: Secondary | ICD-10-CM

## 2015-08-17 LAB — POCT URINALYSIS DIP (DEVICE)
Bilirubin Urine: NEGATIVE
GLUCOSE, UA: 100 mg/dL — AB
Hgb urine dipstick: NEGATIVE
Ketones, ur: NEGATIVE mg/dL
LEUKOCYTES UA: NEGATIVE
Nitrite: NEGATIVE
Protein, ur: NEGATIVE mg/dL
Specific Gravity, Urine: 1.015 (ref 1.005–1.030)
UROBILINOGEN UA: 0.2 mg/dL (ref 0.0–1.0)
pH: 5 (ref 5.0–8.0)

## 2015-08-17 LAB — BASIC METABOLIC PANEL WITH GFR
BUN: 10 mg/dL (ref 7–25)
CALCIUM: 8.9 mg/dL (ref 8.6–10.3)
CO2: 21 mmol/L (ref 20–31)
CREATININE: 0.5 mg/dL — AB (ref 0.70–1.33)
Chloride: 100 mmol/L (ref 98–110)
GFR, Est African American: >89 mL/min (ref >=60)
Glucose, Bld: 207 mg/dL — ABNORMAL HIGH (ref 65–99)
Potassium: 3.9 mmol/L (ref 3.5–5.3)
SODIUM: 132 mmol/L — AB (ref 135–146)

## 2015-08-17 LAB — HEMOGLOBIN A1C
Hgb A1c MFr Bld: 11.3 % — ABNORMAL HIGH (ref <5.7)
MEAN PLASMA GLUCOSE: 278 mg/dL

## 2015-08-17 MED ORDER — LISINOPRIL 10 MG PO TABS: 10.0000 mg | ORAL_TABLET | Freq: Every day | ORAL | Status: DC

## 2015-08-17 MED ORDER — CANAGLIFLOZIN 100 MG PO TABS: 100.0000 mg | ORAL_TABLET | Freq: Every day | ORAL | Status: DC

## 2015-08-17 MED ORDER — PRAVASTATIN SODIUM 40 MG PO TABS: 40.0000 mg | ORAL_TABLET | Freq: Every day | ORAL | Status: DC

## 2015-08-17 MED ORDER — METFORMIN HCL 1000 MG PO TABS: 1000.0000 mg | ORAL_TABLET | Freq: Two times a day (BID) | ORAL | Status: DC

## 2015-08-17 NOTE — Progress Notes (Signed)
Subjective:    Patient ID: Carl [name], male    DOB: 04/30/1965, 50 y.o.   MRN: [id number] Hypertension This is a chronic problem. The current episode started more than 1 year ago. The problem has been resolved since onset. Pertinent negatives include no anxiety, blurred vision, chest pain, headaches, malaise/fatigue, neck pain, orthopnea, palpitations, peripheral edema, PND, shortness of breath or sweats. There are no associated agents to hypertension. Risk factors for coronary artery disease include diabetes mellitus, dyslipidemia, obesity and sedentary lifestyle. Past treatments include ACE inhibitors. The current treatment provides significant improvement. There is no history of CVA. There is no history of sleep apnea.  Diabetes He presents for his follow-up diabetic visit. He has type 2 diabetes mellitus. His disease course has been improving. Pertinent negatives for hypoglycemia include no dizziness, headaches or sweats. Pertinent negatives for diabetes include no blurred vision, no chest pain, no fatigue, no polydipsia, no polyphagia and no polyuria. Pertinent negatives for hypoglycemia complications include no blackouts, no hospitalization and no required glucagon injection. Symptoms are stable. Pertinent negatives for diabetic complications include no CVA or heart disease. Risk factors for coronary artery disease include dyslipidemia. Current diabetic treatment includes diet and oral agent (dual therapy). He is compliant with treatment all of the time. His home blood glucose trend is decreasing steadily.    Past Medical History  Diagnosis Date  . Diabetes mellitus without complication (HCC)   . Hypertension    Immunization History  Administered Date(s) Administered  . Influenza Split 11/30/2011  . Influenza,inj,Quad PF,36+ Mos 01/19/2015  No Known Allergies  Social History   Social History  . Marital Status: Single    Spouse Name: N/A  . Number of Children: N/A  . Years of  Education: N/A   Occupational History  . Not on file.   Social History Main Topics  . Smoking status: Former Smoker -- 0.50 packs/day for 2 years    Quit date: 10/05/1984  . Smokeless tobacco: Not on file  . Alcohol Use: Yes     Comment: occ  . Drug Use: No  . Sexual Activity: Yes   Other Topics Concern  . Not on file   Social History Narrative   Review of Systems  Constitutional: Positive for unexpected weight change. Negative for chills, malaise/fatigue and fatigue.  HENT: Negative for dental problem, drooling, trouble swallowing and voice change.   Eyes: Negative.  Negative for blurred vision, photophobia and visual disturbance.  Respiratory: Negative for cough, chest tightness and shortness of breath.   Cardiovascular: Negative.  Negative for chest pain, palpitations, orthopnea, leg swelling and PND.  Gastrointestinal: Negative.  Negative for abdominal pain, constipation and anal bleeding.  Endocrine: Negative for cold intolerance, polydipsia, polyphagia and polyuria.  Musculoskeletal: Negative.  Negative for neck pain.  Skin: Negative.   Allergic/Immunologic: Negative for immunocompromised state.  Neurological: Positive for facial asymmetry. Negative for dizziness, syncope and headaches.  Hematological: Negative.   Psychiatric/Behavioral: Negative.  Negative for suicidal ideas and sleep disturbance.       Objective:   Physical Exam  Constitutional: He is oriented to person, place, and time. He appears well-developed and well-nourished.  Obesity  HENT:  Head: Normocephalic and atraumatic.  Right Ear: Hearing, tympanic membrane, external ear and ear canal normal.  Left Ear: Hearing, tympanic membrane, external ear and ear canal normal.  Nose: Nose normal.  Mouth/Throat: Uvula is midline, oropharynx is clear and moist and mucous membranes are normal.  Eyes: Conjunctivae, EOM and lids are normal.  Pupils are equal, round, and reactive to light.  Neck: Trachea normal,  normal range of motion and full passive range of motion without pain. Neck supple.  Cardiovascular: Normal rate and regular rhythm.   Pulmonary/Chest: Effort normal and breath sounds normal. He has no decreased breath sounds.  Abdominal: Soft. Normal appearance and bowel sounds are normal.  Increased abdominal girth  Musculoskeletal: Normal range of motion.  Lymphadenopathy:       Head (right side): No submental and no submandibular adenopathy present.       Head (left side): No submental and no submandibular adenopathy present.  Neurological: He is alert and oriented to person, place, and time. He has normal reflexes. No cranial nerve deficit or sensory deficit.  Right side facial asymetry  Monofilament exam negative  Skin: Skin is warm, dry and intact.  Psychiatric: He has a normal mood and affect. His behavior is normal. Judgment and thought content normal.      BP 128/85 mmHg  Pulse 59  Temp(Src) 98.2 F (36.8 C) (Oral)  Resp 16  Ht  (1.778 m)  Wt 268 lb (121.564 kg)  BMI 38.45 kg/m2  SpO2 99%  Assessment & Plan:  1. Controlled type 2 diabetes mellitus without complication, without long-term current use of insulin [facility]) Patient states that he has been consistently taking anti-diabetic medications. He denies having symptoms of hyperglycemia or hyperglycemia. Blood sugars have ranged between 120-180 in am. Monofilament examination negative. - canagliflozin (INVOKANA) 100 MG TABS tablet; Take 1 tablet (100 mg total) by mouth daily.  Dispense: 30 tablet; Refill: 5 - metFORMIN (GLUCOPHAGE) 1000 MG tablet; Take 1 tablet (1,000 mg total) by mouth 2 (two) times daily with a meal.  Dispense: 60 tablet; Refill: 5 - Hemoglobin A1c - BASIC METABOLIC PANEL WITH GFR  2. Hyperlipidemia The patient is asked to make an attempt to improve diet and exercise patterns to aid in medical management of this problem. - pravastatin (PRAVACHOL) 40 MG tablet; Take 1 tablet (40 mg total) by mouth  daily.  Dispense: 30 tablet; Refill: 5  3. Essential hypertension Blood pressure is at goal on current medication regimen.  - lisinopril (PRINIVIL,ZESTRIL) 10 MG tablet; Take 1 tablet (10 mg total) by mouth daily.  Dispense: 30 tablet; Refill: 5 - BASIC METABOLIC PANEL WITH GFR  4. BMI 40.0-44.9, adult (HCC) Recommend a lowfat, low carbohydrate diet divided over 5-6 small meals, increase water intake to 6-8 glasses, and 150 minutes per week of cardiovascular exercise.    The patient was given clear instructions to go to ER or return to medical center if symptoms do not improve, worsen or new problems develop. The patient verbalized understanding. Will notify patient with laboratory results.  RTC: 3 months  for diabetes and hypertension Lenardo Westwood M, FNP

## 2015-08-19 ENCOUNTER — Telehealth: Payer: Self-pay

## 2015-08-19 NOTE — Telephone Encounter (Signed)
-----   Message from Massie MaroonLachina M Hollis, [place] sent at 08/18/2015  1:29 PM EDT ----- Regarding: lab results Please inform [name] that hemoglobin a1C has increased to 11.3 from 9.4. Please stress the importance of following diet and medication regimen consistently. Recommend a lowfat, low carbohydrate diet divided over 5-6 small meals, increase water intake to 6-8 glasses, and 150 minutes per week of cardiovascular exercise.  Please follow up in office as scheduled.  Thanks  ----- Message -----    From: [facility]    Sent: 08/17/2015  10:35 PM      To: Massie MaroonLachina M Hollis, FNP

## 2015-08-19 NOTE — Telephone Encounter (Signed)
Called no answer, left message to return call. Will try later. Thanks!

## 2015-08-20 NOTE — Telephone Encounter (Signed)
Called, no answer. Left message for patient to call back.  

## 2015-08-23 NOTE — Telephone Encounter (Signed)
Called, no answer. I will mail letter to patient. Thanks!

## 2015-11-17 ENCOUNTER — Ambulatory Visit: Payer: Self-pay | Admitting: Family Medicine

## 2020-09-21 ENCOUNTER — Observation Stay (HOSPITAL_COMMUNITY): Payer: Self-pay

## 2020-09-21 ENCOUNTER — Inpatient Hospital Stay (HOSPITAL_COMMUNITY)
Admission: EM | Admit: 2020-09-21 | Discharge: 2020-09-28 | DRG: 617 | Disposition: A | Discharge status: Home / Self Care | Payer: Self-pay | Attending: Family Medicine | Admitting: Family Medicine

## 2020-09-21 ENCOUNTER — Other Ambulatory Visit: Payer: Self-pay

## 2020-09-21 ENCOUNTER — Emergency Department (HOSPITAL_COMMUNITY): Payer: Self-pay

## 2020-09-21 ENCOUNTER — Telehealth: Payer: Self-pay | Admitting: Podiatry

## 2020-09-21 ENCOUNTER — Encounter (HOSPITAL_COMMUNITY): Payer: Self-pay | Admitting: *Deleted

## 2020-09-21 ENCOUNTER — Telehealth: Payer: Self-pay | Admitting: Family Medicine

## 2020-09-21 DIAGNOSIS — Z79899 Other long term (current) drug therapy: Secondary | ICD-10-CM

## 2020-09-21 DIAGNOSIS — L03032 Cellulitis of left toe: Secondary | ICD-10-CM | POA: Diagnosis present

## 2020-09-21 DIAGNOSIS — L02612 Cutaneous abscess of left foot: Secondary | ICD-10-CM | POA: Diagnosis present

## 2020-09-21 DIAGNOSIS — Z91138 Patient's unintentional underdosing of medication regimen for other reason: Secondary | ICD-10-CM

## 2020-09-21 DIAGNOSIS — E119 Type 2 diabetes mellitus without complications: Secondary | ICD-10-CM

## 2020-09-21 DIAGNOSIS — E1152 Type 2 diabetes mellitus with diabetic peripheral angiopathy with gangrene: Secondary | ICD-10-CM | POA: Diagnosis present

## 2020-09-21 DIAGNOSIS — M869 Osteomyelitis, unspecified: Secondary | ICD-10-CM | POA: Diagnosis present

## 2020-09-21 DIAGNOSIS — M76892 Other specified enthesopathies of left lower limb, excluding foot: Secondary | ICD-10-CM | POA: Diagnosis present

## 2020-09-21 DIAGNOSIS — E1169 Type 2 diabetes mellitus with other specified complication: Principal | ICD-10-CM | POA: Diagnosis present

## 2020-09-21 DIAGNOSIS — Z87891 Personal history of nicotine dependence: Secondary | ICD-10-CM

## 2020-09-21 DIAGNOSIS — Z9889 Other specified postprocedural states: Secondary | ICD-10-CM

## 2020-09-21 DIAGNOSIS — Z481 Encounter for planned postprocedural wound closure: Secondary | ICD-10-CM

## 2020-09-21 DIAGNOSIS — M86172 Other acute osteomyelitis, left ankle and foot: Secondary | ICD-10-CM

## 2020-09-21 DIAGNOSIS — Z6834 Body mass index (BMI) 34.0-34.9, adult: Secondary | ICD-10-CM

## 2020-09-21 DIAGNOSIS — Z833 Family history of diabetes mellitus: Secondary | ICD-10-CM

## 2020-09-21 DIAGNOSIS — E11628 Type 2 diabetes mellitus with other skin complications: Secondary | ICD-10-CM | POA: Diagnosis present

## 2020-09-21 DIAGNOSIS — M65172 Other infective (teno)synovitis, left ankle and foot: Secondary | ICD-10-CM | POA: Diagnosis present

## 2020-09-21 DIAGNOSIS — E669 Obesity, unspecified: Secondary | ICD-10-CM | POA: Diagnosis present

## 2020-09-21 DIAGNOSIS — E1165 Type 2 diabetes mellitus with hyperglycemia: Secondary | ICD-10-CM | POA: Diagnosis present

## 2020-09-21 DIAGNOSIS — E785 Hyperlipidemia, unspecified: Secondary | ICD-10-CM | POA: Diagnosis present

## 2020-09-21 DIAGNOSIS — I10 Essential (primary) hypertension: Secondary | ICD-10-CM | POA: Diagnosis present

## 2020-09-21 DIAGNOSIS — Z20822 Contact with and (suspected) exposure to covid-19: Secondary | ICD-10-CM | POA: Diagnosis present

## 2020-09-21 DIAGNOSIS — T383X6A Underdosing of insulin and oral hypoglycemic [antidiabetic] drugs, initial encounter: Secondary | ICD-10-CM | POA: Diagnosis present

## 2020-09-21 DIAGNOSIS — L03116 Cellulitis of left lower limb: Secondary | ICD-10-CM | POA: Diagnosis present

## 2020-09-21 DIAGNOSIS — Z7984 Long term (current) use of oral hypoglycemic drugs: Secondary | ICD-10-CM

## 2020-09-21 DIAGNOSIS — Z8249 Family history of ischemic heart disease and other diseases of the circulatory system: Secondary | ICD-10-CM

## 2020-09-21 LAB — CBC WITH DIFFERENTIAL/PLATELET
Abs Immature Granulocytes: 0.16 10*3/uL — ABNORMAL HIGH (ref 0.00–0.07)
Basophils Absolute: 0.1 10*3/uL (ref 0.0–0.1)
Basophils Relative: 0 %
Eosinophils Absolute: 0.1 10*3/uL (ref 0.0–0.5)
Eosinophils Relative: 0 %
HCT: 36.2 % — ABNORMAL LOW (ref 39.0–52.0)
Hemoglobin: 12.1 g/dL — ABNORMAL LOW (ref 13.0–17.0)
Immature Granulocytes: 1 %
Lymphocytes Relative: 10 %
Lymphs Abs: 1.4 10*3/uL (ref 0.7–4.0)
MCH: 28.1 pg (ref 26.0–34.0)
MCHC: 33.4 g/dL (ref 30.0–36.0)
MCV: 84 fL (ref 80.0–100.0)
Monocytes Absolute: 1 10*3/uL (ref 0.1–1.0)
Monocytes Relative: 7 %
Neutro Abs: 11.4 10*3/uL — ABNORMAL HIGH (ref 1.7–7.7)
Neutrophils Relative %: 82 %
Platelets: 286 10*3/uL (ref 150–400)
RBC: 4.31 MIL/uL (ref 4.22–5.81)
RDW: 13.5 % (ref 11.5–15.5)
WBC: 14.1 10*3/uL — ABNORMAL HIGH (ref 4.0–10.5)
nRBC: 0 % (ref 0.0–0.2)

## 2020-09-21 LAB — URINALYSIS, ROUTINE W REFLEX MICROSCOPIC
Bacteria, UA: NONE SEEN
Bilirubin Urine: NEGATIVE
Glucose, UA: >=500 mg/dL — AB
Hgb urine dipstick: NEGATIVE
Ketones, ur: 80 mg/dL — AB
Leukocytes,Ua: NEGATIVE
Nitrite: NEGATIVE
Protein, ur: NEGATIVE mg/dL
Specific Gravity, Urine: 1.029 (ref 1.005–1.030)
pH: 5 (ref 5.0–8.0)

## 2020-09-21 LAB — COMPREHENSIVE METABOLIC PANEL
ALT: 25 U/L (ref 0–44)
AST: 19 U/L (ref 15–41)
Albumin: 3.2 g/dL — ABNORMAL LOW (ref 3.5–5.0)
Alkaline Phosphatase: 94 U/L (ref 38–126)
Anion gap: 11 (ref 5–15)
BUN: 11 mg/dL (ref 6–20)
CO2: 24 mmol/L (ref 22–32)
Calcium: 8.6 mg/dL — ABNORMAL LOW (ref 8.9–10.3)
Chloride: 96 mmol/L — ABNORMAL LOW (ref 98–111)
Creatinine, Ser: 0.64 mg/dL (ref 0.61–1.24)
GFR, Estimated: >60 mL/min (ref >60)
Glucose, Bld: 322 mg/dL — ABNORMAL HIGH (ref 70–99)
Potassium: 3.8 mmol/L (ref 3.5–5.1)
Sodium: 131 mmol/L — ABNORMAL LOW (ref 135–145)
Total Bilirubin: 0.9 mg/dL (ref 0.3–1.2)
Total Protein: 7 g/dL (ref 6.5–8.1)

## 2020-09-21 LAB — LIPID PANEL
Cholesterol: 119 mg/dL (ref 0–200)
HDL: 24 mg/dL — ABNORMAL LOW (ref >40)
LDL Cholesterol: 78 mg/dL (ref 0–99)
Total CHOL/HDL Ratio: 5 RATIO
Triglycerides: 83 mg/dL (ref <150)
VLDL: 17 mg/dL (ref 0–40)

## 2020-09-21 LAB — CBG MONITORING, ED: Glucose-Capillary: 286 mg/dL — ABNORMAL HIGH (ref 70–99)

## 2020-09-21 LAB — RESP PANEL BY RT-PCR (FLU A&B, COVID) ARPGX2
Influenza A by PCR: NEGATIVE
Influenza B by PCR: NEGATIVE
SARS Coronavirus 2 by RT PCR: NEGATIVE

## 2020-09-21 LAB — LACTIC ACID, PLASMA: Lactic Acid, Venous: 1.6 mmol/L (ref 0.5–1.9)

## 2020-09-21 LAB — HIV ANTIBODY (ROUTINE TESTING W REFLEX): HIV Screen 4th Generation wRfx: NONREACTIVE

## 2020-09-21 LAB — PROTIME-INR
INR: 1.2 (ref 0.8–1.2)
Prothrombin Time: 15.2 seconds (ref 11.4–15.2)

## 2020-09-21 LAB — HEMOGLOBIN A1C
Hgb A1c MFr Bld: 10.7 % — ABNORMAL HIGH (ref 4.8–5.6)
Mean Plasma Glucose: 260.39 mg/dL

## 2020-09-21 LAB — GLUCOSE, CAPILLARY
Glucose-Capillary: 272 mg/dL — ABNORMAL HIGH (ref 70–99)
Glucose-Capillary: 293 mg/dL — ABNORMAL HIGH (ref 70–99)

## 2020-09-21 LAB — APTT: aPTT: 29 seconds (ref 24–36)

## 2020-09-21 IMAGING — DX DG FOOT COMPLETE 3+V*L*
3 series · 3 of 3 positions shown · non-contrast
Comparison: None

CLINICAL DATA: Chronic wound infection involving the third toe.

EXAM:
LEFT FOOT - COMPLETE 3+ VIEW

[foot ap]
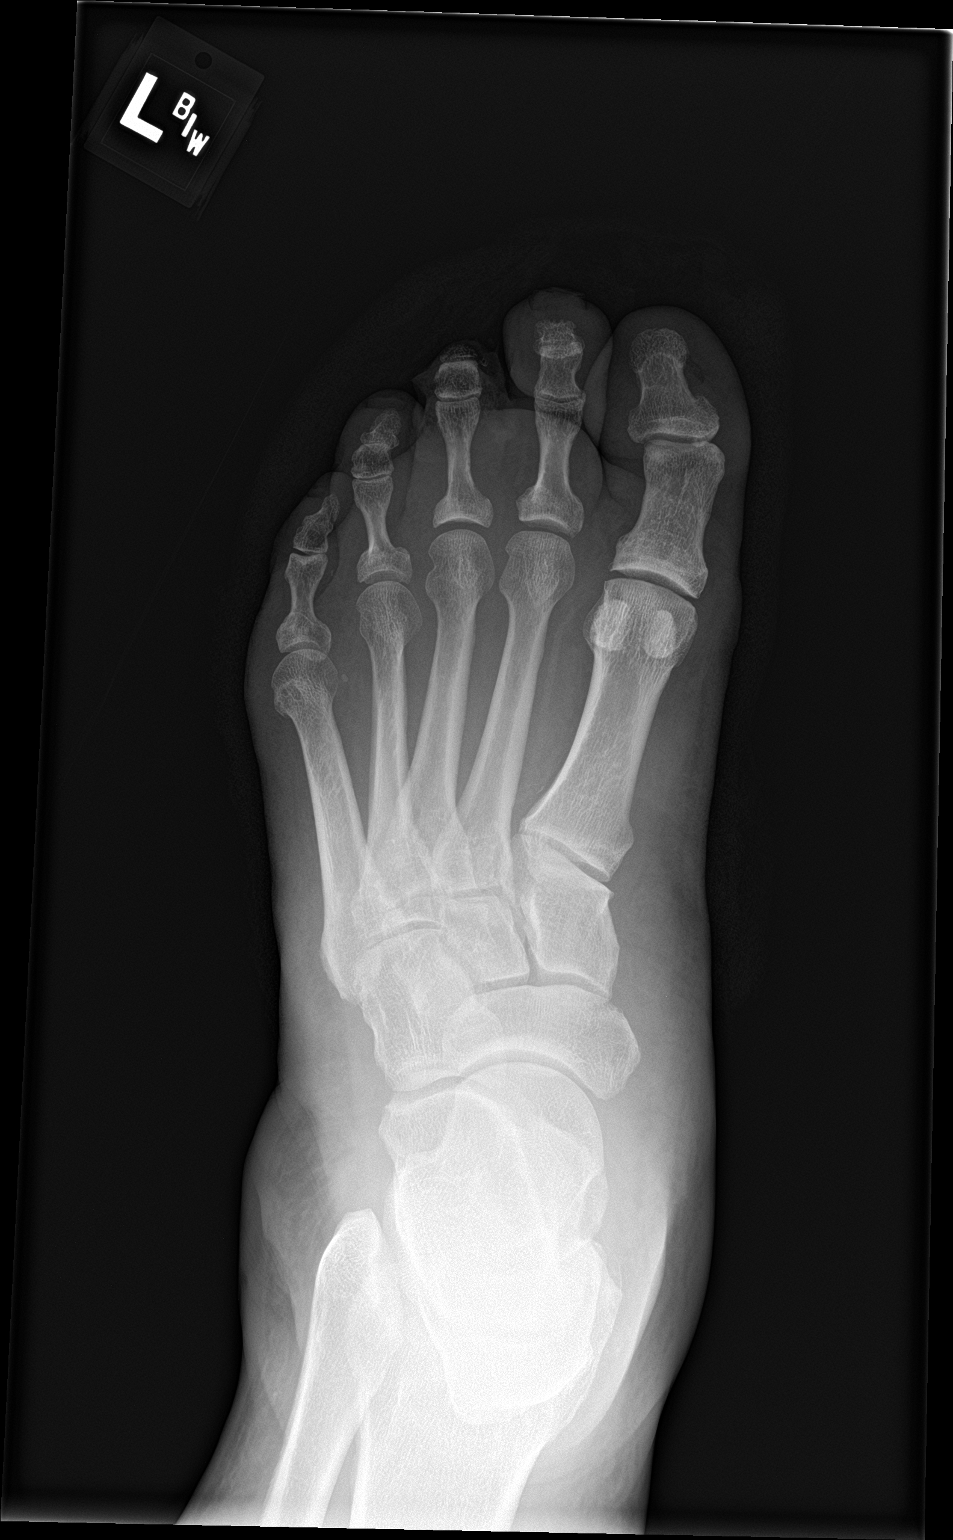

[foot obl]
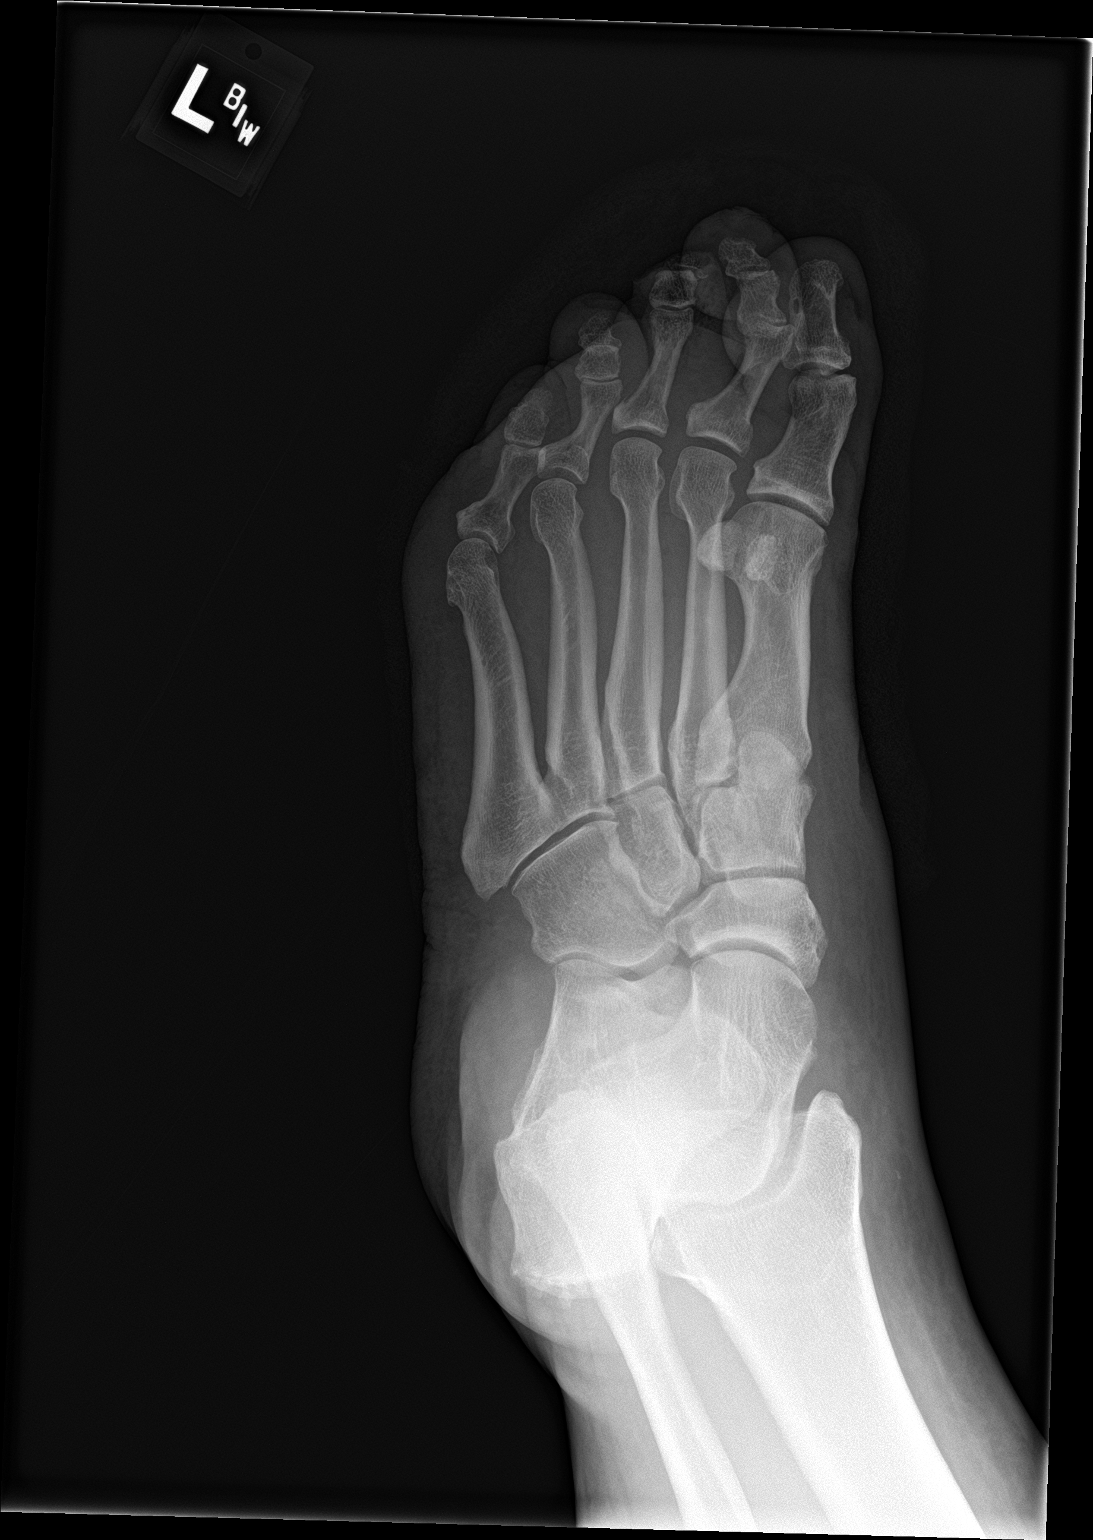

[foot lat]
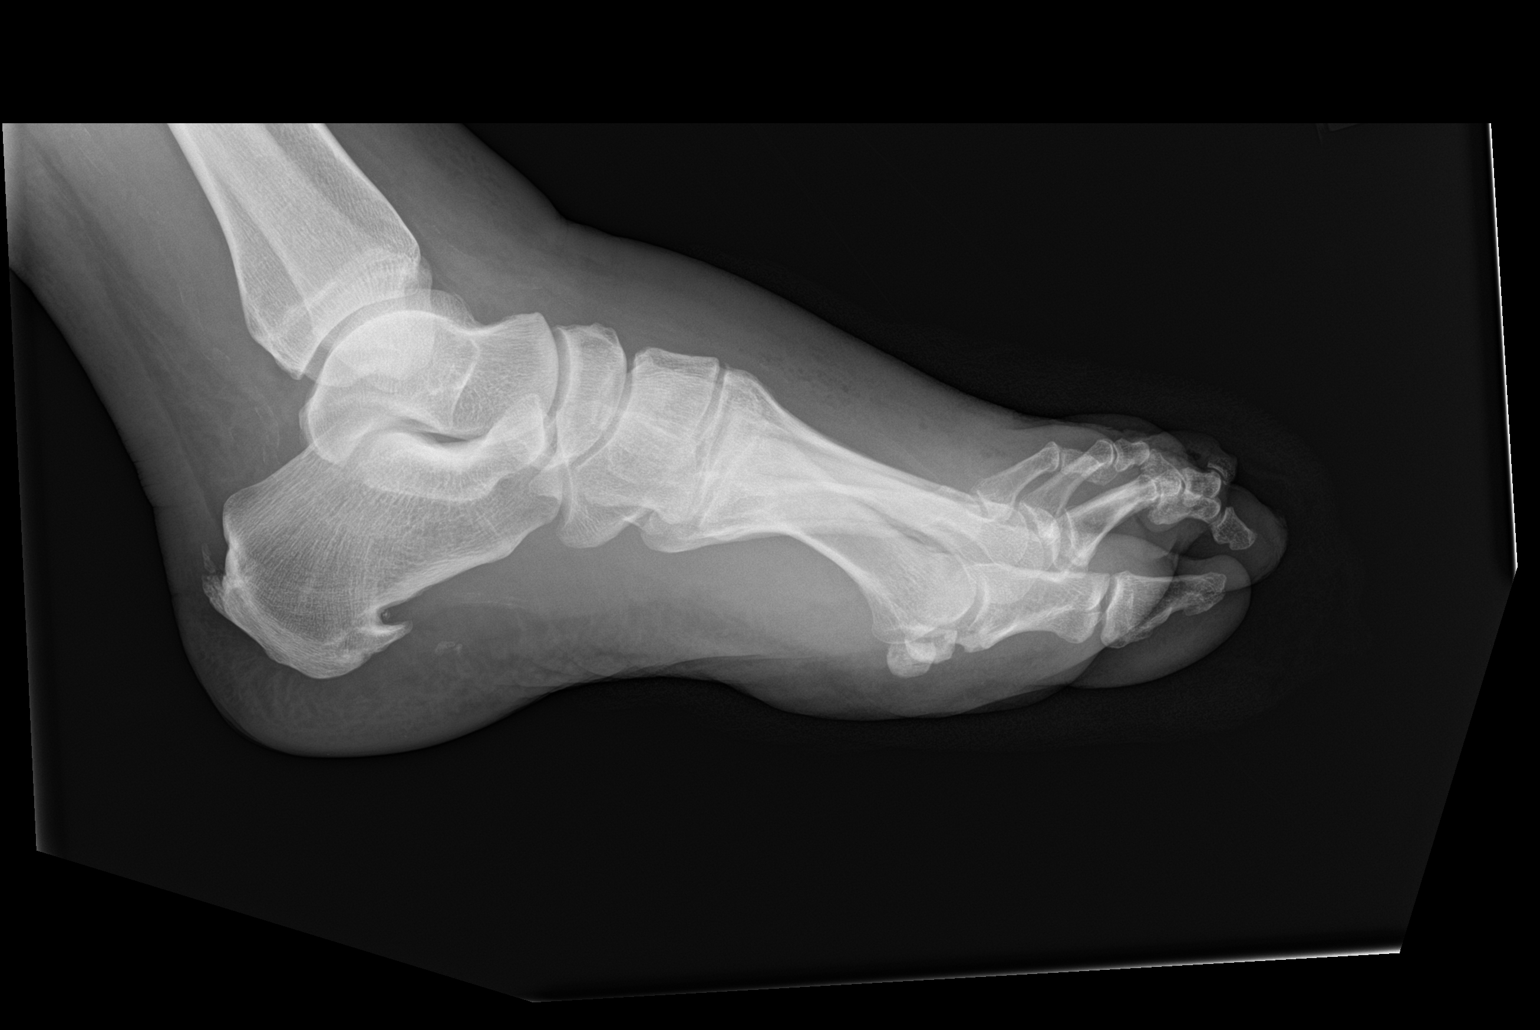

[3 of 3 positions shown; findings below may reference images not displayed]

FINDINGS: Extensive soft tissue defect involving the third toe along with gas
noted in the soft tissues. Only in the base of the distal phalanx
remains. Findings could be due to chronic osteomyelitis or prior
osteotomy/partially petitioned. Recommend clinical correlation. The
DIP joint is maintained. I do not see findings suspicious for septic
arthritis.

The other bony structures are intact. No findings suspicious for
septic arthritis or osteomyelitis elsewhere.

Diffuse and marked subcutaneous soft tissue swelling/edema/fluid and
some gas in the dorsal soft tissues of the foot.

Calcaneal spurring changes.
IMPRESSION: 1. Extensive soft tissue defect involving the third toe along with
only a small portion of the distal phalanx identified. Findings
could be due to chronic osteomyelitis or prior osteotomy/partial
amputation.
2. No plain film findings for septic arthritis or osteomyelitis
elsewhere.

## 2020-09-21 IMAGING — MR MR FOOT*L* W/O CM
4 of 6 series · 18 of 40 positions shown · non-contrast
Comparison: Plain films left foot today.

CLINICAL DATA: Diabetic patient who developed an infection of the
third toe of the left foot a few weeks ago. Worsening pain and
swelling. The patient reports purulent drainage from the toe.

EXAM:
MRI OF THE LEFT FOOT WITHOUT CONTRAST
TECHNIQUE: Multiplanar, multisequence MR imaging of the foot was performed. No
intravenous contrast was administered.

[Series 3: T1 · coronal · 3.0mm · 0.31mm/px · 3 of 51 slices shown (1 of 2)]
[im 7/51]
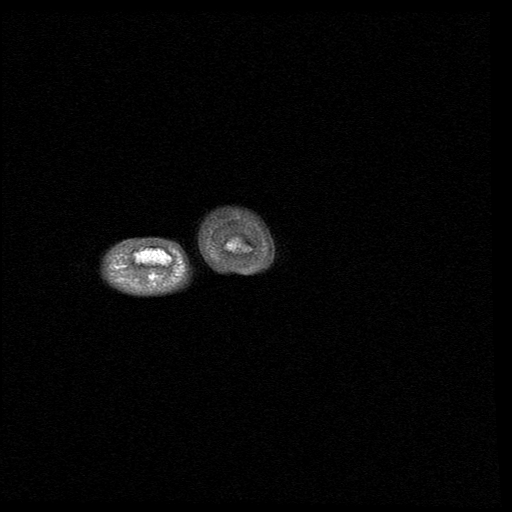
[im 26/51]
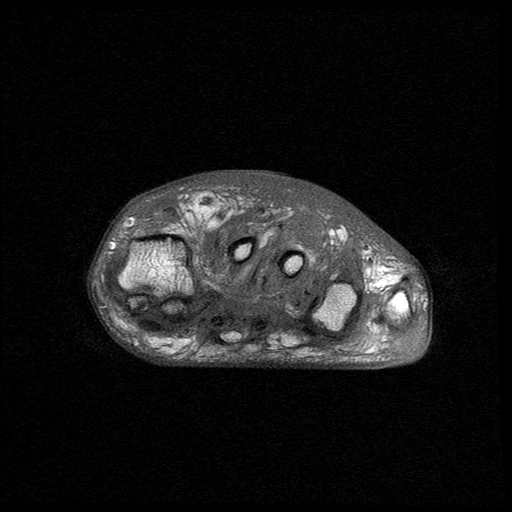
[im 44/51]
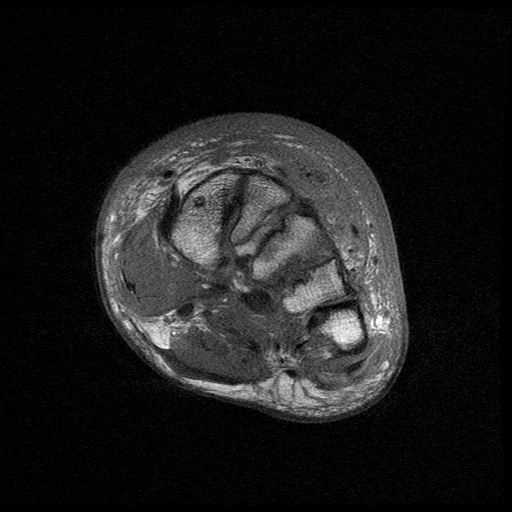

[Series 4: T1 · axial · 3.0mm · 0.33mm/px · z∈[-76,+19]mm · 3 of 28 slices shown (2 of 2)]
[im 1/28]
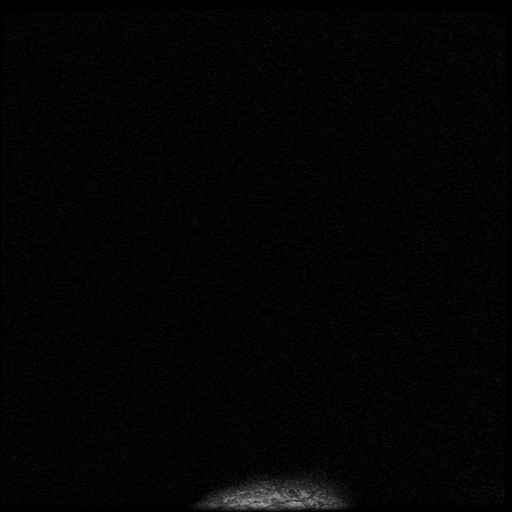
[im 14/28]
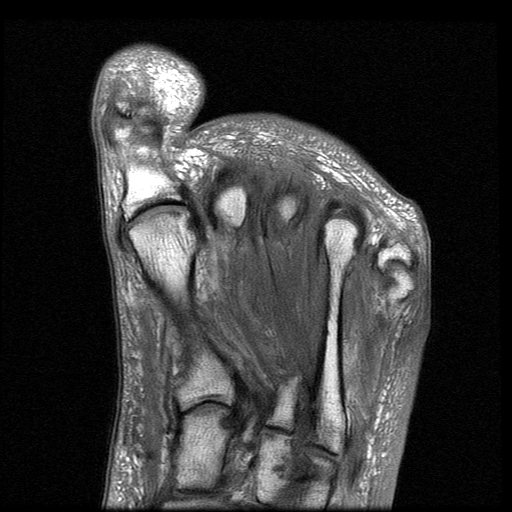
[im 28/28]
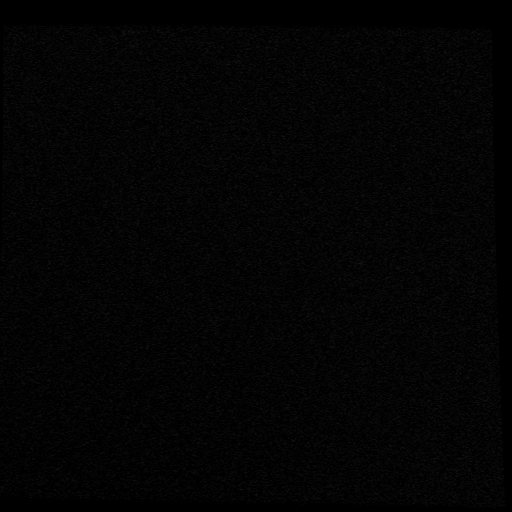

[Series 5: T2 fat-sat · coronal · 3.0mm · 0.31mm/px · 9 of 51 slices shown (1 of 2)]
[im 1/51]
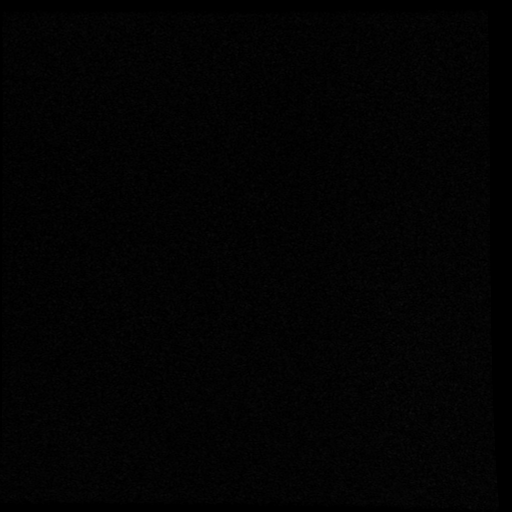
[im 7/51]
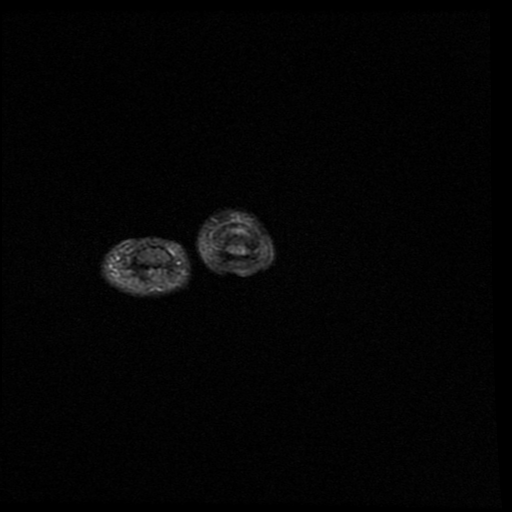
[im 13/51]
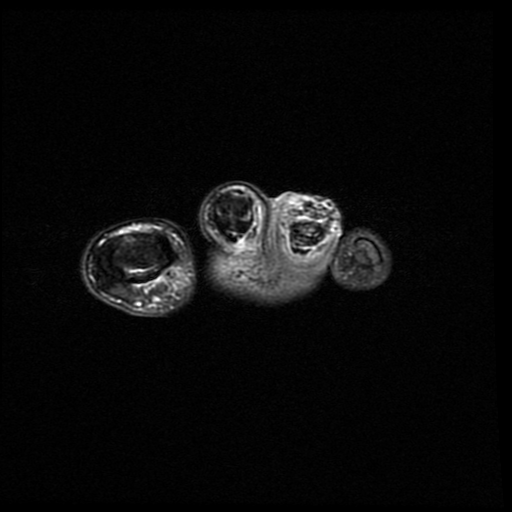
[im 19/51]
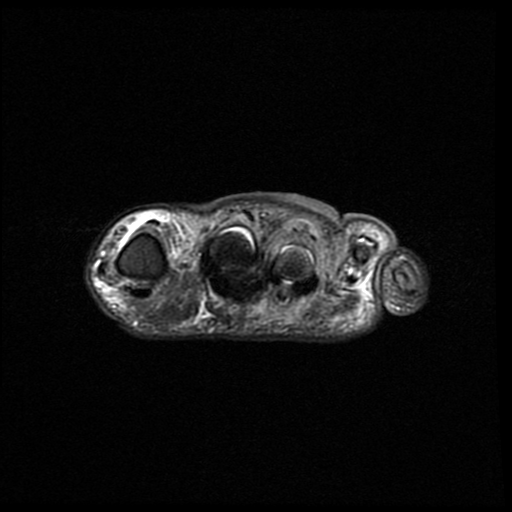
[im 26/51]
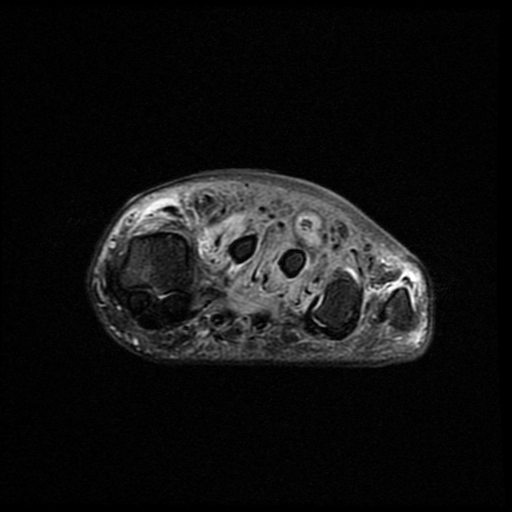
[im 32/51]
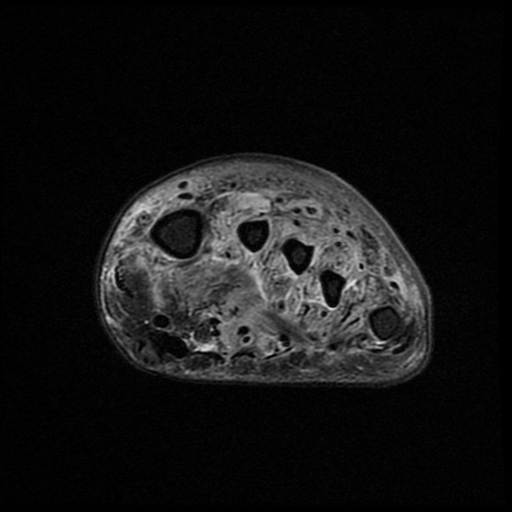
[im 38/51]
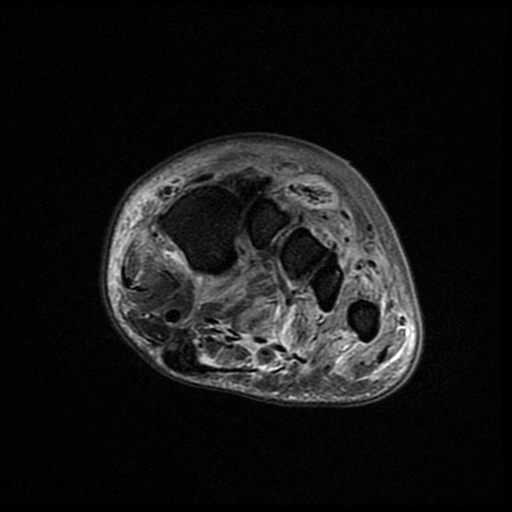
[im 44/51]
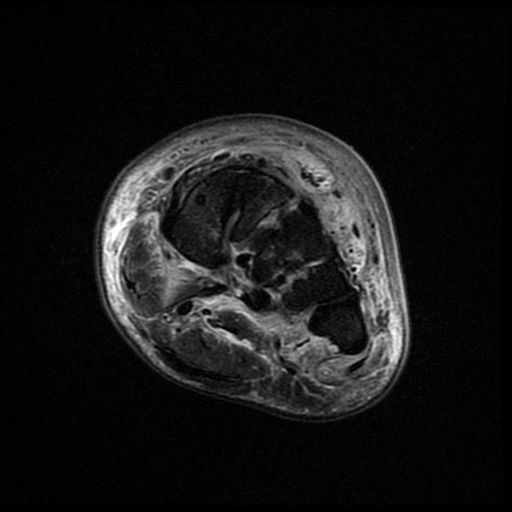
[im 51/51]
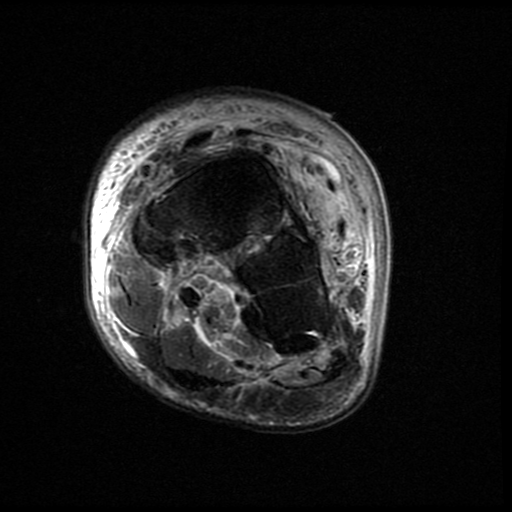

[Series 7: T2 fat-sat · axial · 3.0mm · 0.33mm/px · z∈[-76,+19]mm · 3 of 28 slices shown (2 of 2)]
[im 1/28]
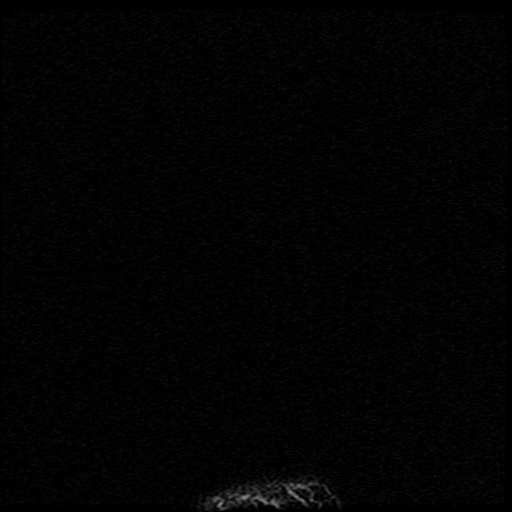
[im 14/28]
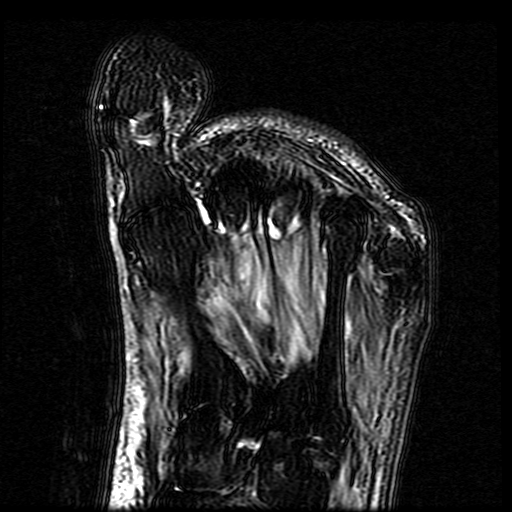
[im 28/28]
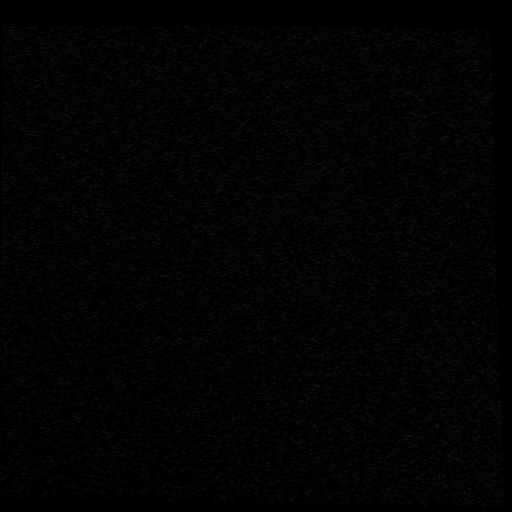

[18 of 40 positions shown; findings below may reference images not displayed]

FINDINGS: Bones/Joint/Cartilage

Marrow edema is seen throughout the third toe consistent with
osteomyelitis. Destructive change of the tuft of the distal phalanx
is noted. Edema is also seen in the head and neck of the third
metatarsal.

There is also marrow edema in the inferior 1.3 cm of the middle
cuneiform with more patchy but more diffuse marrow edema in the
medial cuneiform.

Bone marrow signal is otherwise negative. No finding to suggest
septic joint.

Ligaments

Appear intact.

Muscles and Tendons

There is complex appearing fluid and gas within the sheath of the
extensor tendons of the third toe extending to the level of the mid
lateral cuneiform highly suspicious for septic tenosynovitis. No
intramuscular fluid collection is identified. Intermediate increased
T2 signal in musculature the foot is compatible with diabetic
myopathy.

Soft tissues

There is intense subcutaneous edema diffusely about the foot. Gas in
the subcutaneous tissues about the distal third toe noted. No
definite abscess is identified.
IMPRESSION: Findings consistent with osteomyelitis in the head and neck of the
third metatarsal and throughout the third toe.

Gas and fluid in the sheath of the extensor tendon to the third toe
consistent with septic tenosynovitis.

Marrow edema and enhancement in the middle and medial cuneiforms
could be degenerative or due to stress change but given the extent
of the patient's soft tissue infection, the finding is worrisome for
osteomyelitis.

Intense cellulitis about the foot.

## 2020-09-21 MED ORDER — VANCOMYCIN HCL 1500 MG/300ML IV SOLN
1500.0000 mg | Freq: Two times a day (BID) | INTRAVENOUS | Status: DC
  Administered 2020-09-22 – 2020-09-26 (×8): 1500 mg via INTRAVENOUS
  Filled 2020-09-21 (×10): qty 300

## 2020-09-21 MED ORDER — OXYCODONE HCL 5 MG PO TABS
5.0000 mg | ORAL_TABLET | ORAL | Status: DC | PRN
  Administered 2020-09-22: 5 mg via ORAL
  Filled 2020-09-21: qty 1

## 2020-09-21 MED ORDER — MORPHINE SULFATE (PF) 4 MG/ML IV SOLN
4.0000 mg | Freq: Once | INTRAVENOUS | Status: DC
Start: 2020-09-21 — End: 2020-09-21
  Filled 2020-09-21: qty 1

## 2020-09-21 MED ORDER — PIPERACILLIN-TAZOBACTAM 3.375 G IVPB 30 MIN
3.3750 g | Freq: Once | INTRAVENOUS | Status: AC
  Administered 2020-09-21: 3.375 g via INTRAVENOUS
  Filled 2020-09-21: qty 50

## 2020-09-21 MED ORDER — ACETAMINOPHEN 325 MG PO TABS
650.0000 mg | ORAL_TABLET | Freq: Four times a day (QID) | ORAL | Status: DC
  Administered 2020-09-21 – 2020-09-23 (×4): 650 mg via ORAL
  Filled 2020-09-21 (×5): qty 2

## 2020-09-21 MED ORDER — INSULIN ASPART 100 UNIT/ML IJ SOLN
0.0000 [IU] | INTRAMUSCULAR | Status: DC
  Administered 2020-09-21: 5 [IU] via SUBCUTANEOUS

## 2020-09-21 MED ORDER — SODIUM CHLORIDE 0.9 % IV SOLN
2.0000 g | Freq: Three times a day (TID) | INTRAVENOUS | Status: DC
  Administered 2020-09-21 – 2020-09-27 (×17): 2 g via INTRAVENOUS
  Filled 2020-09-21 (×20): qty 2

## 2020-09-21 MED ORDER — ONDANSETRON HCL 4 MG/2ML IJ SOLN
4.0000 mg | Freq: Once | INTRAMUSCULAR | Status: AC
  Administered 2020-09-21: 4 mg via INTRAVENOUS
  Filled 2020-09-21: qty 2

## 2020-09-21 MED ORDER — HEPARIN SODIUM (PORCINE) 5000 UNIT/ML IJ SOLN
5000.0000 [IU] | Freq: Three times a day (TID) | INTRAMUSCULAR | Status: DC
  Administered 2020-09-21 – 2020-09-28 (×18): 5000 [IU] via SUBCUTANEOUS
  Filled 2020-09-21 (×19): qty 1

## 2020-09-21 MED ORDER — VANCOMYCIN HCL 10 G IV SOLR
2500.0000 mg | Freq: Once | INTRAVENOUS | Status: AC
  Administered 2020-09-21: 2500 mg via INTRAVENOUS
  Filled 2020-09-21: qty 2500

## 2020-09-21 MED ORDER — INSULIN ASPART 100 UNIT/ML IJ SOLN
0.0000 [IU] | Freq: Three times a day (TID) | INTRAMUSCULAR | Status: DC
  Administered 2020-09-22 – 2020-09-23 (×4): 8 [IU] via SUBCUTANEOUS
  Administered 2020-09-23: 5 [IU] via SUBCUTANEOUS
  Administered 2020-09-24: 8 [IU] via SUBCUTANEOUS
  Administered 2020-09-24: 5 [IU] via SUBCUTANEOUS
  Administered 2020-09-24: 8 [IU] via SUBCUTANEOUS
  Administered 2020-09-25: 5 [IU] via SUBCUTANEOUS
  Administered 2020-09-25: 8 [IU] via SUBCUTANEOUS
  Administered 2020-09-25 – 2020-09-26 (×3): 5 [IU] via SUBCUTANEOUS
  Administered 2020-09-26: 3 [IU] via SUBCUTANEOUS
  Administered 2020-09-27 (×3): 5 [IU] via SUBCUTANEOUS
  Administered 2020-09-28: 11 [IU] via SUBCUTANEOUS
  Administered 2020-09-28: 3 [IU] via SUBCUTANEOUS

## 2020-09-21 MED ORDER — HYDROMORPHONE HCL 1 MG/ML IJ SOLN
0.5000 mg | Freq: Once | INTRAMUSCULAR | Status: AC
  Administered 2020-09-21: 0.5 mg via INTRAVENOUS
  Filled 2020-09-21: qty 1

## 2020-09-21 NOTE — Telephone Encounter (Signed)
Spoke to ED before - aware of consult will see today

## 2020-09-21 NOTE — Progress Notes (Signed)
Attempted to see patient. Patient off the floor for MRI then heading to his room. Will examine tomorrow AM and determine plan.   Please keep NPO after MN in anticipation for surgery tomorrow.   Ventura Sellers DPM

## 2020-09-21 NOTE — Progress Notes (Signed)
FPTS Brief Progress Note  S: Patient resting comfortably in bed watching television. Reporting some mild pain in left foot depending on how he moves. Patient is working to find soft surface to rest foot on for maximal comfort. He denies any nausea, abdominal pain or SOB at this time. Patient expressions appreciation for his care thus far. Patient updated on plan for NPO at MN and likely to go to OR with podiatry in the AM.    O: BP (!) 142/72 (BP Location: Right Arm)   Pulse 75   Temp 98.6 F (37 C) (Oral)   Resp 16   Ht 5\' 10"  (1.778 m)   Wt 108.7 kg   SpO2 97%   BMI 34.38 kg/m   Physical Exam  General: pleasantly conversational male appearing stated age in no acute distress lying supine in hospital bed dressed in gown  Cardio: Normal S1 and S2, no S3 or S4. Rhythm is regular. Systolic murmur best heard in left upper sternal border, no friction rubs.  Pulm: Clear to auscultation bilaterally, no crackles, wheezing, or diminished breath sounds. Normal respiratory effort, stable on RA Abdomen: Bowel sounds normal. Abdomen soft and non-tender.  Extremities: No peripheral edema, LLE notable for kerlix wrap on foot 2-4 inches above the ankle, appreciated erythema ascending above left ankle, warm to touch, mild tenderness to palpation, RLE without erythema or tenderness or edema noted    A/P: Mr. Arguijo is a 55 year old gentleman with a history of type 2 diabetes, HTN, hyperlipidemia who presented with 2 weeks of worsening pain, swelling, and necrosis of his left third toe now with confirmed osteomyelitis on MRI.   L Foot Osteomyelitis with Overlying Cellulitis Not seen by podiatry this evening as patient was in MRI. MRI with findings consistent with osteomyelitis in the head neck of the third metatarsal and throughout the third toe.  Also suspicious for septic tenosynovitis of the third toe.  Marrow edema and enhancement worrisome for osteomyelitis of the middle and medial cuneiforms.  Review of  podiatry consult note has plan for OR on 7/13.  - OR 7/13 - NPO at midnight order placed - f/u ABIs - Follow-up blood cultures - Continue Vanco and cefepime - Orders reviewed. Labs for AM ordered, which was adjusted as needed.    8/13, MD 09/21/2020, 10:50 PM PGY-3, [facility] Night Resident  Please page [phone] with questions.

## 2020-09-21 NOTE — ED Notes (Signed)
Patient transported to MRI 

## 2020-09-21 NOTE — ED Triage Notes (Signed)
To ED for eval of 'toe pain'. Pt with 3rd toe on left foot black in color, foul smelling, and draining. Left foot noted to be bright red. Ambulatory with pain.

## 2020-09-21 NOTE — Telephone Encounter (Signed)
Provider requesting consult:  Jodi Geralds  [phone]  Dx of patient: 3rd toe left foot ,turning back, strong odor

## 2020-09-21 NOTE — Progress Notes (Signed)
Pharmacy Antibiotic Note  Carl [name] is a 54 y.o. male admitted on 09/21/2020 with  osteomyelitis .  Pharmacy has been consulted for cefepime and vancomycin dosing.  Patient reports that the third toe on his left foot started to become red and swollen a few weeks ago. Reports it is now draining pus.  Plan: Cefepime 2g q8h Vancomycin 2500 mg given in ED Vancomycin 1500mg  q12h - unless change in renal function F/u podiatry recommendations F/u cultures  Height: 5\' 10"  (177.8 cm) Weight: 107.5 kg (237 lb) IBW/kg (Calculated) : 73  Temp (24hrs), Avg:99.1 F (37.3 C), Min:99.1 F (37.3 C), Max:99.1 F (37.3 C)  Recent Labs  Lab 09/21/20 1030  WBC 14.1*  CREATININE 0.64  LATICACIDVEN 1.6    Estimated Creatinine Clearance: 126.6 mL/min (by C-G formula based on SCr of 0.64 mg/dL).    Allergies  Allergen Reactions   Morphine And Related Other (See Comments)    hallucinations    Antimicrobials this admission: cefepime 7/12 >> vancomycin 7/12 >>   Microbiology results: Pending  Thank you for allowing pharmacy to be a part of this patient's care.  9/12 09/21/2020 6:22 PM

## 2020-09-21 NOTE — H&P (Addendum)
[facility] Admission History and Physical Service Pager: [phone]  Patient name: Carl [name] Medical record number: [id number] Date of birth: 1965/04/24 Age: 55 y.o. Gender: male  Primary Care Provider: Dorena Dew, FNP Consultants: Carl [name], podiatry Code Status: Full  Preferred Emergency Contact:  Carl [name] (Daughter): [phone]  Chief Complaint: Left foot infection  Assessment and Plan: Carl [name] is a 55 y.o. male presenting with middle toe gangrene on the left foot and complication of foot cellulitis. PMH is significant for T2MD, HTN, and HLD  Left foot osteomyelitis with cellulitis Carl [name] is a 55 year old male who presented with middle toe gangrene of the left foot with worsening complications of cellulitis.  He started noticing the black toe 2 weeks ago and in the last 2 weeks, have experienced worsening pain and swelling extending to his foot.  He complains of difficulty walking but denies any chills or fever.  Initial vitals within normal limits other than patient began hypertensive.  Patient did not meet SIRS/sepsis criteria on admission.  White blood cell count elevated at 14.1.  Lactic acid within normal limits at 1.6.  X-ray reads as extensive soft tissue defect involving the third toe with only a small portion of the distal phalanx identified.MRI is currently pending.  Middle toe of the left foot appears gangrenous and foul smelling with erythema spreading to the foot close to the ankle.  Patient will likely need surgical intervention/amputation for this.  Patient does have cellulitis extending up from the toe across the anterior aspect of the foot towards the ankle.  He has good peripheral pulses in the right foot but unable to palpate these in the left foot partly due to swelling and edema from the cellulitis.  We will plan to check ABIs. Admit to [place], attending Dr. Andria [name] -Consult podiatrist, appreciate recommendations -We will check  ABI -Left foot MRI pending -Status post 1 dose of Dilaudid for pain in the ED -Acetaminophen scheduled every 6 hours -Oxycodone 5 mg as needed for severe pain -Continue vancomycin and Zosyn pending surgical eval and potential/likely intervention -Follow-up blood cultures  Type 2 diabetes Patient had a history of type 2 diabetes indicated that he has not taken his medications in over a year.  His blood glucose level is 322 on admission. -start sliding scale insulin q4h while NPO, we will change to meals pending surgery schedule. -monitor CBG -check A1c  Hypertension  He has not taken any medications for over a year.  His blood pressure is 179/79 on admission.  He was started on pain medication and blood pressure has dropped to 144/91. -Continue to monitor blood pressure -Hold starting blood pressure medication until surgery scheduled.  Hyperlipidemia Takes home medication of pravastatin.  He has not taken his medications for over a year. -We will currently hold pravastatin with the plan to likely change to atorvastatin prior to discharge as the patient would likely benefit from high intensity statin -Measure lipid panel  No PCP Patient has no PCP and indicated he has not been on his medication in over a year due to insurance. -discuss establishing PCP  FEN/GI: NPO pending podiatry recommendation  Prophylaxis: Levonox  Disposition: Admit to MedSurg  History of Present Illness:  Carl [name] is a 55 y.o. male presenting with worsening left middle toe infection for the last 2weeks.  Redness started about 2 days ago with increasing pain and swelling.  He has difficulty walking due to the pain.  Patient reports having episodes  of chills twice 2 days ago but attributes that to air condition.    Has not been on any medication for at least a year.  Previously on metformin, lisinopril, pravastatin.  Denies any history of surgery ordered and an I&D about 12 years ago for an  abscess.  States his other medical history involves type 2 diabetes, hypertension, hyperlipidemia but is not taking his medications at least a year as he has had no insurance and then without a PCP.  Review Of Systems: Per HPI with the following additions:  Review of Systems  Constitutional:  Positive for chills (questionable). Negative for fever.  Eyes:  Negative for visual disturbance.  Respiratory:  Negative for chest tightness and shortness of breath.   Cardiovascular:  Negative for chest pain and palpitations.  Gastrointestinal:  Negative for constipation.  Genitourinary:  Negative for dysuria.  Musculoskeletal:  Positive for joint swelling.  Neurological:  Negative for headaches.    Patient Active Problem List   Diagnosis Date Noted   Osteomyelitis ([place]) 09/21/2020   Right-sided Bell's palsy 01/20/2015   Uncontrolled type 2 diabetes mellitus without complication, without long-term current use of insulin 01/19/2015   BMI 40.0-44.9, adult ([place]) 06/12/2011   Diabetes type 2, controlled ([place]) 06/12/2011   Hyperlipidemia 06/12/2011   Hypertension 06/12/2011    Past Medical History: Past Medical History:  Diagnosis Date   Diabetes mellitus without complication ([place])    Hypertension     Past Surgical History: Past Surgical History:  Procedure Laterality Date   HAND SURGERY     VASECTOMY      Social History: Social History   Tobacco Use   Smoking status: Former    Packs/day: 0.50    Years: 2.00    Pack years: 1.00    Types: Cigarettes    Quit date: 10/05/1984    Years since quitting: 35.9  Substance Use Topics   Alcohol use: Yes    Comment: occ   Drug use: No     Family History: Family History  Problem Relation Age of Onset   Diabetes Mother    Hypertension Mother    Hypertension Father      Allergies and Medications: Allergies  Allergen Reactions   Morphine And Related Other (See Comments)    hallucinations   No current facility-administered  medications on file prior to encounter.   Current Outpatient Medications on File Prior to Encounter  Medication Sig Dispense Refill   Apple Cider Vinegar 500 MG TABS Take 1,000 mg by mouth 2 (two) times daily.     diphenhydrAMINE (BENADRYL) 25 MG tablet Take 25-50 mg by mouth every 6 (six) hours as needed for sleep.     ibuprofen (ADVIL) 200 MG tablet Take 800 mg by mouth every 6 (six) hours as needed for mild pain.     Omega-3 Fatty Acids (FISH OIL) 500 MG CAPS Take 500 mg by mouth every evening.     Turmeric 500 MG TABS Take 500 mg by mouth 2 (two) times daily.     blood glucose meter kit and supplies KIT Dispense based on patient and insurance preference. Use up to four times daily as directed. (FOR ICD-9 250.00, 250.01). 1 each 0   canagliflozin (INVOKANA) 100 MG TABS tablet Take 1 tablet (100 mg total) by mouth daily. (Patient not taking: Reported on 09/21/2020) 30 tablet 5   lisinopril (PRINIVIL,ZESTRIL) 10 MG tablet Take 1 tablet (10 mg total) by mouth daily. (Patient not taking: Reported on 09/21/2020) 30 tablet 5  metFORMIN (GLUCOPHAGE) 1000 MG tablet Take 1 tablet (1,000 mg total) by mouth 2 (two) times daily with a meal. (Patient not taking: Reported on 09/21/2020) 60 tablet 5   pravastatin (PRAVACHOL) 40 MG tablet Take 1 tablet (40 mg total) by mouth daily. (Patient not taking: Reported on 09/21/2020) 30 tablet 5    Objective: BP (!) 144/91   Pulse 77   Temp 99.1 F (37.3 C) (Oral)   Resp 19   Ht 5' 10"  (1.778 m)   Wt 107.5 kg   SpO2 98%   BMI 34.01 kg/m  Exam: General: Alert and not in acute discomfort Eyes: Pupils are reactive to light bilaterally, no jaundice Cardiovascular: Normal rate and rhythm, no murmurs.  Peripheral pulses are able to be palpated on the right foot, however these are limited on the left foot due to edema/swelling. Respiratory: Clear breath sounds bilaterally, no wheezing or crackles Gastrointestinal: No abdominal distention or tenderness Derm: dry  skin, abrasion diffused on bilateral upper and lower extremity. Open lesion on the right knee. Lower extremities: No pitting edema of the right lower extremity, left lower extremity with erythema extending up to the region of the ankle along with some mild pitting edema along the dorsal aspect of the foot.  The patient has a gangrenous third toe on the left foot with purulent drainage. Neuro: No focal deficit, gross cranial nerves intact  Photo per ED provider:     Labs and Imaging: CBC BMET  Recent Labs  Lab 09/21/20 1030  WBC 14.1*  HGB 12.1*  HCT 36.2*  PLT 286   Recent Labs  Lab 09/21/20 1030  NA 131*  K 3.8  CL 96*  CO2 24  BUN 11  CREATININE 0.64  GLUCOSE 322*  CALCIUM 8.6Alen Bleacher, MD 09/21/2020, 2:14 PM PGY-1, [place] Intern pager: [phone], text pages welcome   Upper Level Addendum:  I have seen and evaluated this patient along with Dr. Adah Salvage and reviewed the above note, making necessary revisions as appropriate in green.  I agree with the medical decision making and physical exam as noted above.  Lurline Del, DO PGY-3 [facility]

## 2020-09-21 NOTE — ED Provider Notes (Signed)
Emergency Medicine Provider Triage Evaluation Note  Carl [name] , a 55 y.o. male  was evaluated in triage.  Pt complains of foot infection.  Patient reports he has had a worsening foot infection over the past few weeks, starting in his left third toe, then he reports the toe seem to burst open with purulent drainage and a week ago the toe turned black.  Over the past few days patient has noticed increasing redness extending up the foot it is hot to the touch.  Patient has not had fevers.  He has not had any prior amputations, is a type II diabetic.  The foot is now extremely painful to walk on.  Review of Systems  Positive: Wound infection, redness, drainage Negative: Fever, vomiting, chest pain, shortness of breath  Physical Exam  BP (!) 179/79   Pulse 92   Temp 99.1 F (37.3 C) (Oral)   Resp 18   SpO2 99%  Gen:   Awake, no distress   Resp:  Normal effort  MSK:   Third toe is black with foul odor and purulent drainage, erythema extending throughout the foot, warm to the touch, see photo below Other:       Medical Decision Making  Medically screening exam initiated at 10:12 AM.  Appropriate orders placed.  JOEL COWIN was informed that the remainder of the evaluation will be completed by another provider, this initial triage assessment does not replace that evaluation, and the importance of remaining in the ED until their evaluation is complete.  Patient with gangrenous left third toe with erythema and warmth extending up throughout the foot, high clinical suspicion for osteomyelitis.   Dartha Lodge, PA-C 09/21/20 1016    Gerhard Munch, MD 09/23/20 1106

## 2020-09-21 NOTE — ED Provider Notes (Signed)
[facility] EMERGENCY DEPARTMENT Provider Note   CSN: [id number] Arrival date & time: 09/21/20  6599     History Chief Complaint  Patient presents with   Wound Infection    Carl [name] is a 55 y.o. male.  Carl [name] is a 55 y.o. male with history of hypertension, poorly controlled type 2 diabetes, obesity, hyperlipidemia, who presents to the emergency department for evaluation of left foot infection.  Patient reports that the third toe on his left foot started to become red and swollen a few weeks ago and then he reports it "busted open" draining pus.  He reports since then pain has worsened, about a week ago the left third toe started to turn black and is continued to drain foul-smelling fluid.  He reports over the past few days he started to note redness spreading up the foot.  He reports he has been avoiding coming in.  No wounds on his other foot and no prior amputations.  Reports he has poorly controlled diabetes.  He has not seen anyone regarding this and has not been on any antibiotics.  Reports he is just been putting hydrogen peroxide on it to try and keep it clean but it is continued to get worse.  No fevers.  No nausea or vomiting.  No other aggravating or alleviating factors.  The history is provided by the patient.      Past Medical History:  Diagnosis Date   Diabetes mellitus without complication ([place])    Hypertension     Patient Active Problem List   Diagnosis Date Noted   Right-sided Bell's palsy 01/20/2015   Uncontrolled type 2 diabetes mellitus without complication, without long-term current use of insulin 01/19/2015   BMI 40.0-44.9, adult ([place]) 06/12/2011   Diabetes type 2, controlled ([place]) 06/12/2011   Hyperlipidemia 06/12/2011   Hypertension 06/12/2011    Past Surgical History:  Procedure Laterality Date   HAND SURGERY     VASECTOMY         Family History  Problem Relation Age of Onset   Diabetes Mother    Hypertension Mother     Hypertension Father     Social History   Tobacco Use   Smoking status: Former    Packs/day: 0.50    Years: 2.00    Pack years: 1.00    Types: Cigarettes    Quit date: 10/05/1984    Years since quitting: 35.9  Substance Use Topics   Alcohol use: Yes    Comment: occ   Drug use: No    Home Medications Prior to Admission medications   Medication Sig Start Date End Date Taking? Authorizing Provider  blood glucose meter kit and supplies KIT Dispense based on patient and insurance preference. Use up to four times daily as directed. (FOR ICD-9 250.00, 250.01). 01/12/15   Davonna Belling, MD  canagliflozin (INVOKANA) 100 MG TABS tablet Take 1 tablet (100 mg total) by mouth daily. 08/17/15   Dorena Dew, FNP  lisinopril (PRINIVIL,ZESTRIL) 10 MG tablet Take 1 tablet (10 mg total) by mouth daily. 08/17/15   Dorena Dew, FNP  metFORMIN (GLUCOPHAGE) 1000 MG tablet Take 1 tablet (1,000 mg total) by mouth 2 (two) times daily with a meal. 08/17/15   Dorena Dew, FNP  pravastatin (PRAVACHOL) 40 MG tablet Take 1 tablet (40 mg total) by mouth daily. 08/17/15   Dorena Dew, FNP    Allergies    Patient has no known allergies.  Review  of Systems   Review of Systems  Constitutional:  Negative for chills and fever.  HENT: Negative.    Respiratory:  Negative for cough and shortness of breath.   Cardiovascular:  Positive for leg swelling. Negative for chest pain.  Gastrointestinal:  Negative for abdominal pain, nausea and vomiting.  Musculoskeletal:  Negative for arthralgias and joint swelling.  Skin:  Positive for color change and wound.  Neurological:  Negative for weakness and numbness.  All other systems reviewed and are negative.  Physical Exam Updated Vital Signs BP (!) 157/75 (BP Location: Left Arm)   Pulse 81   Temp 99.1 F (37.3 C) (Oral)   Resp 16   SpO2 100%   Physical Exam Vitals and nursing note reviewed.  Constitutional:      General: He is not in acute  distress.    Appearance: Normal appearance. He is well-developed. He is obese. He is not diaphoretic.  HENT:     Head: Normocephalic and atraumatic.  Eyes:     General:        Right eye: No discharge.        Left eye: No discharge.     Pupils: Pupils are equal, round, and reactive to light.  Cardiovascular:     Rate and Rhythm: Normal rate and regular rhythm.     Pulses: Normal pulses.     Heart sounds: Normal heart sounds.  Pulmonary:     Effort: Pulmonary effort is normal. No respiratory distress.     Breath sounds: Normal breath sounds. No wheezing or rales.     Comments: Respirations equal and unlabored, patient able to speak in full sentences, lungs clear to auscultation bilaterally  Abdominal:     General: Bowel sounds are normal. There is no distension.     Palpations: Abdomen is soft. There is no mass.     Tenderness: There is no abdominal tenderness. There is no guarding.     Comments: Abdomen soft, nondistended, nontender to palpation in all quadrants without guarding or peritoneal signs  Musculoskeletal:     Cervical back: Neck supple.     Comments: Left third toe with black necrotic tissue, purulent drainage and foul odor, redness and swelling extending from the toe throughout the rest of the foot.  Distal pulses intact.  (See photo below)  Skin:    General: Skin is warm and dry.     Capillary Refill: Capillary refill takes less than 2 seconds.  Neurological:     Mental Status: He is alert and oriented to person, place, and time.     Coordination: Coordination normal.     Comments: Speech is clear, able to follow commands Moves extremities without ataxia, coordination intact  Psychiatric:        Mood and Affect: Mood normal.        Behavior: Behavior normal.      ED Results / Procedures / Treatments   Labs (all labs ordered are listed, but only abnormal results are displayed) Labs Reviewed  COMPREHENSIVE METABOLIC PANEL - Abnormal; Notable for the following  components:      Result Value   Sodium 131 (*)    Chloride 96 (*)    Glucose, Bld 322 (*)    Calcium 8.6 (*)    Albumin 3.2 (*)    All other components within normal limits  CBC WITH DIFFERENTIAL/PLATELET - Abnormal; Notable for the following components:   WBC 14.1 (*)    Hemoglobin 12.1 (*)    HCT 36.2 (*)  Neutro Abs 11.4 (*)    Abs Immature Granulocytes 0.16 (*)    All other components within normal limits  URINALYSIS, ROUTINE W REFLEX MICROSCOPIC - Abnormal; Notable for the following components:   APPearance CLOUDY (*)    Glucose, UA >=500 (*)    Ketones, ur 80 (*)    All other components within normal limits  URINE CULTURE  CULTURE, BLOOD (ROUTINE X 2)  CULTURE, BLOOD (ROUTINE X 2)  RESP PANEL BY RT-PCR (FLU A&B, COVID) ARPGX2  LACTIC ACID, PLASMA  PROTIME-INR  APTT  HIV ANTIBODY (ROUTINE TESTING W REFLEX)  LIPID PANEL  HEMOGLOBIN A1C    EKG None  Radiology DG Foot Complete Left  Result Date: 09/21/2020 CLINICAL DATA:  Chronic wound infection involving the third toe. EXAM: LEFT FOOT - COMPLETE 3+ VIEW COMPARISON:  None FINDINGS: Extensive soft tissue defect involving the third toe along with gas noted in the soft tissues. Only in the base of the distal phalanx remains. Findings could be due to chronic osteomyelitis or prior osteotomy/partially petitioned. Recommend clinical correlation. The DIP joint is maintained. I do not see findings suspicious for septic arthritis. The other bony structures are intact. No findings suspicious for septic arthritis or osteomyelitis elsewhere. Diffuse and marked subcutaneous soft tissue swelling/edema/fluid and some gas in the dorsal soft tissues of the foot. Calcaneal spurring changes. IMPRESSION: 1. Extensive soft tissue defect involving the third toe along with only a small portion of the distal phalanx identified. Findings could be due to chronic osteomyelitis or prior osteotomy/partial amputation. 2. No plain film findings for septic  arthritis or osteomyelitis elsewhere. Electronically Signed   By: Marijo Sanes M.D.   On: 09/21/2020 11:03    Procedures .Critical Care  Date/Time: 09/21/2020 4:29 PM Performed by: Jacqlyn Larsen, PA-C Authorized by: Jacqlyn Larsen, PA-C   Critical care provider statement:    Critical care time (minutes):  45   Critical care was necessary to treat or prevent imminent or life-threatening deterioration of the following conditions: Osteomyelitis requiring admission for antibiotics and surgery.   Critical care was time spent personally by me on the following activities:  Discussions with consultants, evaluation of patient's response to treatment, examination of patient, ordering and performing treatments and interventions, ordering and review of laboratory studies, ordering and review of radiographic studies, pulse oximetry, re-evaluation of patient's condition, obtaining history from patient or surrogate and review of old charts   Medications Ordered in ED Medications - No data to display  ED Course  I have reviewed the triage vital signs and the nursing notes.  Pertinent labs & imaging results that were available during my care of the patient were reviewed by me and considered in my medical decision making (see chart for details).    MDM Rules/Calculators/A&P                         55 y.o. male presents to the ED with complaints of gangrenous wound of the left foot, this involves an extensive number of treatment options, and is a complaint that carries with it a high risk of complications and morbidity.  Patient with black chronic wound of the left third toe with erythema extending throughout the foot.  High clinical suspicion for osteomyelitis, and in a minimum patient with severe gangrenous foot infection.  Fortunately patient is afebrile with stable vitals and does not currently appear septic.  We will get infectious work-up and x-ray to assess for osteomyelitis.  Additional history  obtained from medical records. Previous records obtained and reviewed via EMR  I ordered IV vancomycin and Zosyn, as well as pain medication.  Lab Tests:  I Ordered, reviewed, and interpreted labs, which included:  CBC: Leukocytosis of 14.1, stable hemoglobin CMP: Mild hyponatremia likely in the setting of elevated glucose of 322, no other significant electrolyte derangements, normal renal and liver function Lactic: WNL Coags: WNL Blood cultures: Pending  Imaging Studies ordered:  I ordered imaging studies which included left foot x-ray, I independently visualized and interpreted imaging which showed extensive soft tissue defect involving the third toe along with only a small portion of the distal phalanx identified, likely in the setting of chronic osteomyelitis, no evidence of osteoelsewhere.  ED Course:   Case discussed with Dr. March Rummage with podiatry who will see and evaluate patient for further management of foot wound, agrees with plan for medicine admission, IV antibiotics and MRI.  I consulted family medicine teaching service and discussed lab and imaging findings, they will see and admit patient.   Portions of this note were generated with [employer]. Dictation errors may occur despite best attempts at proofreading.   Final Clinical Impression(s) / ED Diagnoses Final diagnoses:  Osteomyelitis of left foot, unspecified type [facility])    Rx / DC Orders ED Discharge Orders     None        Janet Berlin 09/21/20 1630    Carmin Muskrat, MD 09/23/20 [phone]

## 2020-09-22 ENCOUNTER — Inpatient Hospital Stay (HOSPITAL_COMMUNITY): Payer: Self-pay | Admitting: Certified Registered Nurse Anesthetist

## 2020-09-22 ENCOUNTER — Encounter (HOSPITAL_COMMUNITY): Payer: Self-pay | Admitting: Family Medicine

## 2020-09-22 ENCOUNTER — Observation Stay (HOSPITAL_COMMUNITY): Payer: Self-pay

## 2020-09-22 ENCOUNTER — Encounter (HOSPITAL_COMMUNITY)
Admission: EM | Disposition: A | Discharge status: Home / Self Care | Payer: Self-pay | Source: Home / Self Care | Attending: Family Medicine

## 2020-09-22 DIAGNOSIS — L089 Local infection of the skin and subcutaneous tissue, unspecified: Secondary | ICD-10-CM

## 2020-09-22 DIAGNOSIS — E11628 Type 2 diabetes mellitus with other skin complications: Secondary | ICD-10-CM

## 2020-09-22 DIAGNOSIS — L03032 Cellulitis of left toe: Secondary | ICD-10-CM

## 2020-09-22 DIAGNOSIS — M869 Osteomyelitis, unspecified: Secondary | ICD-10-CM

## 2020-09-22 DIAGNOSIS — L02612 Cutaneous abscess of left foot: Secondary | ICD-10-CM

## 2020-09-22 HISTORY — PX: AMPUTATION TOE: SHX6595

## 2020-09-22 HISTORY — PX: INCISION AND DRAINAGE: SHX5863

## 2020-09-22 LAB — CBC
HCT: 32.9 % — ABNORMAL LOW (ref 39.0–52.0)
Hemoglobin: 10.8 g/dL — ABNORMAL LOW (ref 13.0–17.0)
MCH: 28.1 pg (ref 26.0–34.0)
MCHC: 32.8 g/dL (ref 30.0–36.0)
MCV: 85.5 fL (ref 80.0–100.0)
Platelets: 263 10*3/uL (ref 150–400)
RBC: 3.85 MIL/uL — ABNORMAL LOW (ref 4.22–5.81)
RDW: 13.6 % (ref 11.5–15.5)
WBC: 10.6 10*3/uL — ABNORMAL HIGH (ref 4.0–10.5)
nRBC: 0 % (ref 0.0–0.2)

## 2020-09-22 LAB — GLUCOSE, CAPILLARY
Glucose-Capillary: 236 mg/dL — ABNORMAL HIGH (ref 70–99)
Glucose-Capillary: 265 mg/dL — ABNORMAL HIGH (ref 70–99)
Glucose-Capillary: 253 mg/dL — ABNORMAL HIGH (ref 70–99)
Glucose-Capillary: 196 mg/dL — ABNORMAL HIGH (ref 70–99)
Glucose-Capillary: 165 mg/dL — ABNORMAL HIGH (ref 70–99)
Glucose-Capillary: 236 mg/dL — ABNORMAL HIGH (ref 70–99)
Glucose-Capillary: 305 mg/dL — ABNORMAL HIGH (ref 70–99)

## 2020-09-22 LAB — BASIC METABOLIC PANEL
Anion gap: 7 (ref 5–15)
BUN: 8 mg/dL (ref 6–20)
CO2: 26 mmol/L (ref 22–32)
Calcium: 8.3 mg/dL — ABNORMAL LOW (ref 8.9–10.3)
Chloride: 101 mmol/L (ref 98–111)
Creatinine, Ser: 0.54 mg/dL — ABNORMAL LOW (ref 0.61–1.24)
GFR, Estimated: >60 mL/min (ref >60)
Glucose, Bld: 275 mg/dL — ABNORMAL HIGH (ref 70–99)
Potassium: 4.3 mmol/L (ref 3.5–5.1)
Sodium: 134 mmol/L — ABNORMAL LOW (ref 135–145)

## 2020-09-22 LAB — URINE CULTURE: Culture: NO GROWTH

## 2020-09-22 LAB — SURGICAL PCR SCREEN
MRSA, PCR: NEGATIVE
Staphylococcus aureus: POSITIVE — AB

## 2020-09-22 SURGERY — INCISION AND DRAINAGE
Anesthesia: Monitor Anesthesia Care | Laterality: Left

## 2020-09-22 MED ORDER — ONDANSETRON HCL 4 MG/2ML IJ SOLN
INTRAMUSCULAR | Status: DC | PRN
  Administered 2020-09-22: 4 mg via INTRAVENOUS

## 2020-09-22 MED ORDER — LACTATED RINGERS IV SOLN: INTRAVENOUS | Status: DC

## 2020-09-22 MED ORDER — BUPIVACAINE HCL (PF) 0.5 % IJ SOLN
INTRAMUSCULAR | Status: AC
  Filled 2020-09-22: qty 30

## 2020-09-22 MED ORDER — FENTANYL CITRATE (PF) 250 MCG/5ML IJ SOLN
INTRAMUSCULAR | Status: AC
  Filled 2020-09-22: qty 5

## 2020-09-22 MED ORDER — FENTANYL CITRATE (PF) 250 MCG/5ML IJ SOLN
INTRAMUSCULAR | Status: DC | PRN
  Administered 2020-09-22: 50 ug via INTRAVENOUS

## 2020-09-22 MED ORDER — ACETAMINOPHEN 500 MG PO TABS
1000.0000 mg | ORAL_TABLET | Freq: Once | ORAL | Status: DC
  Administered 2020-09-22: 1000 mg via ORAL

## 2020-09-22 MED ORDER — INSULIN GLARGINE 100 UNIT/ML ~~LOC~~ SOLN
10.0000 [IU] | Freq: Every day | SUBCUTANEOUS | Status: DC
  Administered 2020-09-23 – 2020-09-24 (×2): 10 [IU] via SUBCUTANEOUS
  Filled 2020-09-22 (×3): qty 0.1

## 2020-09-22 MED ORDER — BUPIVACAINE HCL (PF) 0.5 % IJ SOLN
INTRAMUSCULAR | Status: DC | PRN
  Administered 2020-09-22: 20 mL

## 2020-09-22 MED ORDER — PROPOFOL 500 MG/50ML IV EMUL
INTRAVENOUS | Status: DC | PRN
  Administered 2020-09-22: 100 ug/kg/min via INTRAVENOUS

## 2020-09-22 MED ORDER — METRONIDAZOLE 500 MG PO TABS
500.0000 mg | ORAL_TABLET | Freq: Three times a day (TID) | ORAL | Status: DC
  Administered 2020-09-22 – 2020-09-27 (×14): 500 mg via ORAL
  Filled 2020-09-22 (×15): qty 1

## 2020-09-22 MED ORDER — MIDAZOLAM HCL 2 MG/2ML IJ SOLN
INTRAMUSCULAR | Status: AC
  Filled 2020-09-22: qty 2

## 2020-09-22 MED ORDER — CHLORHEXIDINE GLUCONATE 0.12 % MT SOLN
15.0000 mL | Freq: Once | OROMUCOSAL | Status: AC
  Administered 2020-09-22: 15 mL via OROMUCOSAL
  Filled 2020-09-22: qty 15

## 2020-09-22 MED ORDER — ORAL CARE MOUTH RINSE: 15.0000 mL | Freq: Once | OROMUCOSAL | Status: AC

## 2020-09-22 MED ORDER — CELECOXIB 200 MG PO CAPS: 200.0000 mg | ORAL_CAPSULE | Freq: Once | ORAL | Status: DC

## 2020-09-22 MED ORDER — MIDAZOLAM HCL 2 MG/2ML IJ SOLN
INTRAMUSCULAR | Status: DC | PRN
  Administered 2020-09-22: 2 mg via INTRAVENOUS

## 2020-09-22 MED ORDER — VANCOMYCIN HCL 1000 MG IV SOLR
INTRAVENOUS | Status: AC
  Filled 2020-09-22: qty 1000

## 2020-09-22 MED ORDER — VANCOMYCIN HCL 1000 MG IV SOLR
INTRAVENOUS | Status: DC | PRN
  Administered 2020-09-22: 1000 mg

## 2020-09-22 MED ORDER — HEMOSTATIC AGENTS (NO CHARGE) OPTIME
TOPICAL | Status: DC | PRN
  Administered 2020-09-22: 1 via TOPICAL

## 2020-09-22 MED ORDER — ATORVASTATIN CALCIUM 10 MG PO TABS
20.0000 mg | ORAL_TABLET | Freq: Every day | ORAL | Status: DC
  Administered 2020-09-23 – 2020-09-28 (×6): 20 mg via ORAL
  Filled 2020-09-22 (×6): qty 2

## 2020-09-22 MED ORDER — 0.9 % SODIUM CHLORIDE (POUR BTL) OPTIME
TOPICAL | Status: DC | PRN
  Administered 2020-09-22: 1000 mL

## 2020-09-22 MED ORDER — METRONIDAZOLE 500 MG PO TABS: 500.0000 mg | ORAL_TABLET | Freq: Three times a day (TID) | ORAL | Status: DC

## 2020-09-22 MED ORDER — OXYCODONE HCL 5 MG PO TABS
5.0000 mg | ORAL_TABLET | ORAL | Status: DC
  Administered 2020-09-23 (×3): 5 mg via ORAL
  Filled 2020-09-22 (×3): qty 1

## 2020-09-22 SURGICAL SUPPLY — 37 items
APL PRP STRL LF DISP 70% ISPRP (MISCELLANEOUS) ×1
BAG COUNTER SPONGE SURGICOUNT (BAG) ×2 IMPLANT
BAG SPNG CNTER NS LX DISP (BAG) ×1
BAG SURGICOUNT SPONGE COUNTING (BAG) ×1
BNDG GAUZE ELAST 4 BULKY (GAUZE/BANDAGES/DRESSINGS) ×2 IMPLANT
CHLORAPREP W/TINT 26 (MISCELLANEOUS) ×3 IMPLANT
COVER SURGICAL LIGHT HANDLE (MISCELLANEOUS) ×3 IMPLANT
DRAPE U-SHAPE 47X51 STRL (DRAPES) ×3 IMPLANT
ELECT CAUTERY BLADE 6.4 (BLADE) ×3 IMPLANT
ELECT REM PT RETURN 9FT ADLT (ELECTROSURGICAL) ×3
ELECTRODE REM PT RTRN 9FT ADLT (ELECTROSURGICAL) ×1 IMPLANT
GAUZE SPONGE 4X4 12PLY STRL (GAUZE/BANDAGES/DRESSINGS) ×2 IMPLANT
GLOVE SRG 8 PF TXTR STRL LF DI (GLOVE) ×1 IMPLANT
GLOVE SURG ENC MOIS LTX SZ7.5 (GLOVE) ×3 IMPLANT
GLOVE SURG UNDER POLY LF SZ8 (GLOVE) ×3
GOWN STRL REUS W/ TWL LRG LVL3 (GOWN DISPOSABLE) ×1 IMPLANT
GOWN STRL REUS W/ TWL XL LVL3 (GOWN DISPOSABLE) ×1 IMPLANT
GOWN STRL REUS W/TWL LRG LVL3 (GOWN DISPOSABLE) ×3
GOWN STRL REUS W/TWL XL LVL3 (GOWN DISPOSABLE) ×3
HANDPIECE INTERPULSE COAX TIP (DISPOSABLE) ×3
HEMOSTAT NU-KNIT SURGICAL 3X4 (HEMOSTASIS) ×2 IMPLANT
KIT BASIN OR (CUSTOM PROCEDURE TRAY) ×3 IMPLANT
KIT TURNOVER KIT B (KITS) ×3 IMPLANT
MANIFOLD NEPTUNE II (INSTRUMENTS) ×3 IMPLANT
NS IRRIG 1000ML POUR BTL (IV SOLUTION) ×3 IMPLANT
PACK ORTHO EXTREMITY (CUSTOM PROCEDURE TRAY) ×3 IMPLANT
PAD ABD 8X10 STRL (GAUZE/BANDAGES/DRESSINGS) ×2 IMPLANT
PAD ARMBOARD 7.5X6 YLW CONV (MISCELLANEOUS) ×6 IMPLANT
SET CYSTO W/LG BORE CLAMP LF (SET/KITS/TRAYS/PACK) ×3 IMPLANT
SET HNDPC FAN SPRY TIP SCT (DISPOSABLE) IMPLANT
SOL PREP POV-IOD 4OZ 10% (MISCELLANEOUS) ×6 IMPLANT
SYR CONTROL 10ML LL (SYRINGE) ×2 IMPLANT
TOWEL GREEN STERILE (TOWEL DISPOSABLE) ×3 IMPLANT
TOWEL GREEN STERILE FF (TOWEL DISPOSABLE) ×3 IMPLANT
TUBE CONNECTING 12'X1/4 (SUCTIONS) ×1
TUBE CONNECTING 12X1/4 (SUCTIONS) ×2 IMPLANT
YANKAUER SUCT BULB TIP NO VENT (SUCTIONS) ×3 IMPLANT

## 2020-09-22 NOTE — Consult Note (Signed)
Reason for Consult:Left foot infection Referring Physician: Jodi GeraldsKelsey Ford, PA  Ferrel LoganGary W [name] is an 55 y.o. male.  HPI: States this started as a  2 weeks ago. He noticed it turning black but states he ignored it and thought it would get better but it continued to worsen. Pain not controlled on current regimen.  Past Medical History:  Diagnosis Date   Diabetes mellitus without complication (HCC)    Hypertension     Past Surgical History:  Procedure Laterality Date   HAND SURGERY     VASECTOMY      Family History  Problem Relation Age of Onset   Diabetes Mother    Hypertension Mother    Hypertension Father     Social History:  reports that he quit smoking about 35 years ago. His smoking use included cigarettes. He has a 1.00 pack-year smoking history. He has never used smokeless tobacco. He reports current alcohol use. He reports that he does not use drugs.  Allergies:  Allergies  Allergen Reactions   Morphine And Related Other (See Comments)    hallucinations    Medications: I have reviewed the patient's current medications.  Results for orders placed or performed during the hospital encounter of 09/21/20 (from the past 48 hour(s))  Urinalysis, Routine w reflex microscopic     Status: Abnormal   Collection Time: 09/21/20 10:13 AM  Result Value Ref Range   Color, Urine YELLOW YELLOW   APPearance CLOUDY (A) CLEAR   Specific Gravity, Urine 1.029 1.005 - 1.030   pH 5.0 5.0 - 8.0   Glucose, UA >=500 (A) NEGATIVE mg/dL   Hgb urine dipstick NEGATIVE NEGATIVE   Bilirubin Urine NEGATIVE NEGATIVE   Ketones, ur 80 (A) NEGATIVE mg/dL   Protein, ur NEGATIVE NEGATIVE mg/dL   Nitrite NEGATIVE NEGATIVE   Leukocytes,Ua NEGATIVE NEGATIVE   RBC / HPF 0-5 0 - 5 RBC/hpf   WBC, UA 0-5 0 - 5 WBC/hpf   Bacteria, UA NONE SEEN NONE SEEN   Mucus PRESENT     Comment: Performed at [facility], [address] [place], [place] [address]  Lactic acid, plasma     Status: None   Collection  Time: 09/21/20 10:30 AM  Result Value Ref Range   Lactic Acid, Venous 1.6 0.5 - 1.9 mmol/L    Comment: Performed at [facility], [address] [place], [place] [address]  Comprehensive metabolic panel     Status: Abnormal   Collection Time: 09/21/20 10:30 AM  Result Value Ref Range   Sodium 131 (L) 135 - 145 mmol/L   Potassium 3.8 3.5 - 5.1 mmol/L   Chloride 96 (L) 98 - 111 mmol/L   CO2 24 22 - 32 mmol/L   Glucose, Bld 322 (H) 70 - 99 mg/dL    Comment: Glucose reference range applies only to samples taken after fasting for at least 8 hours.   BUN 11 6 - 20 mg/dL   Creatinine, Ser 0.980.64 0.61 - 1.24 mg/dL   Calcium 8.6 (L) 8.9 - 10.3 mg/dL   Total Protein 7.0 6.5 - 8.1 g/dL   Albumin 3.2 (L) 3.5 - 5.0 g/dL   AST 19 15 - 41 U/L   ALT 25 0 - 44 U/L   Alkaline Phosphatase 94 38 - 126 U/L   Total Bilirubin 0.9 0.3 - 1.2 mg/dL   GFR, Estimated >11>60 >91>60 mL/min    Comment: (NOTE) Calculated using the CKD-EPI Creatinine Equation (2021)    Anion  gap 11 5 - 15    Comment: Performed at [facility], [address], [place], [place] [address]  CBC WITH DIFFERENTIAL     Status: Abnormal   Collection Time: 09/21/20 10:30 AM  Result Value Ref Range   WBC 14.1 (H) 4.0 - 10.5 K/uL   RBC 4.31 4.22 - 5.81 MIL/uL   Hemoglobin 12.1 (L) 13.0 - 17.0 g/dL   HCT 32.2 (L) 02.5 - 42.7 %   MCV 84.0 80.0 - 100.0 fL   MCH 28.1 26.0 - 34.0 pg   MCHC 33.4 30.0 - 36.0 g/dL   RDW 06.2 37.6 - 28.3 %   Platelets 286 150 - 400 K/uL   nRBC 0.0 0.0 - 0.2 %   Neutrophils Relative % 82 %   Neutro Abs 11.4 (H) 1.7 - 7.7 K/uL   Lymphocytes Relative 10 %   Lymphs Abs 1.4 0.7 - 4.0 K/uL   Monocytes Relative 7 %   Monocytes Absolute 1.0 0.1 - 1.0 K/uL   Eosinophils Relative 0 %   Eosinophils Absolute 0.1 0.0 - 0.5 K/uL   Basophils Relative 0 %   Basophils Absolute 0.1 0.0 - 0.1 K/uL   Immature Granulocytes 1 %   Abs Immature Granulocytes 0.16 (H) 0.00 - 0.07 K/uL    Comment: Performed at [facility], [address], [place], [place] [address]  Protime-INR     Status: None   Collection Time: 09/21/20 10:30 AM  Result Value Ref Range   Prothrombin Time 15.2 11.4 - 15.2 seconds   INR 1.2 0.8 - 1.2    Comment: (NOTE) INR goal varies based on device and disease states. Performed at [facility], [address] [place], [place] [address]   APTT     Status: None   Collection Time: 09/21/20 10:30 AM  Result Value Ref Range   aPTT 29 24 - 36 seconds    Comment: Performed at [facility], [address] [place], [place] [address]  Resp Panel by RT-PCR (Flu A&B, Covid) Nasopharyngeal Swab     Status: None   Collection Time: 09/21/20 12:47 PM   Specimen: Nasopharyngeal Swab; Nasopharyngeal(NP) swabs in vial transport medium  Result Value Ref Range   SARS Coronavirus 2 by RT PCR NEGATIVE NEGATIVE    Comment: (NOTE) SARS-CoV-2 target nucleic acids are NOT DETECTED.  The SARS-CoV-2 RNA is generally detectable in upper respiratory specimens during the acute phase of infection. The lowest concentration of SARS-CoV-2 viral copies this assay can detect is 138 copies/mL. A negative result does not preclude SARS-Cov-2 infection and should not be used as the sole basis for treatment or other patient management decisions. A negative result may occur with  improper specimen collection/handling, submission of specimen other than nasopharyngeal swab, presence of viral mutation(s) within the areas targeted by this assay, and inadequate number of viral copies(<138 copies/mL). A negative result must be combined with clinical observations, patient history, and epidemiological information. The expected result is Negative.  Fact Sheet for Patients:  BloggerCourse.com  Fact Sheet for Healthcare Providers:  SeriousBroker.it  This test is no t yet approved or cleared by the [place] FDA and  has been authorized for detection  and/or diagnosis of SARS-CoV-2 by FDA under an Emergency Use Authorization (EUA). This EUA will remain  in effect (meaning this test can be used) for the duration of the COVID-19 declaration under Section 564(b)(1) of the Act, 21 U.S.C.section 360bbb-3(b)(1), unless the authorization  is terminated  or revoked sooner.       Influenza A by PCR NEGATIVE NEGATIVE   Influenza B by PCR NEGATIVE NEGATIVE    Comment: (NOTE) The Xpert Xpress SARS-CoV-2/FLU/RSV plus assay is intended as an aid in the diagnosis of influenza from Nasopharyngeal swab specimens and should not be used as a sole basis for treatment. Nasal washings and aspirates are unacceptable for Xpert Xpress SARS-CoV-2/FLU/RSV testing.  Fact Sheet for Patients: BloggerCourse.com  Fact Sheet for Healthcare Providers: SeriousBroker.it  This test is not yet approved or cleared by the [place] FDA and has been authorized for detection and/or diagnosis of SARS-CoV-2 by FDA under an Emergency Use Authorization (EUA). This EUA will remain in effect (meaning this test can be used) for the duration of the COVID-19 declaration under Section 564(b)(1) of the Act, 21 U.S.C. section 360bbb-3(b)(1), unless the authorization is terminated or revoked.  Performed at [facility], [address], [place], [place] [address]   HIV Antibody (routine testing w rflx)     Status: None   Collection Time: 09/21/20  4:15 PM  Result Value Ref Range   HIV Screen 4th Generation wRfx Non Reactive Non Reactive    Comment: Performed at [facility], [address], [place], [place] [address]  Lipid panel     Status: Abnormal   Collection Time: 09/21/20  4:16 PM  Result Value Ref Range   Cholesterol 119 0 - 200 mg/dL   Triglycerides 83 <314 mg/dL   HDL 24 (L) >97 mg/dL   Total CHOL/HDL Ratio 5.0 RATIO   VLDL 17 0 - 40 mg/dL   LDL Cholesterol 78 0 - 99 mg/dL    Comment:        Total  Cholesterol/HDL:CHD Risk Coronary Heart Disease Risk Table                     Men   Women  1/2 Average Risk   3.4   3.3  Average Risk       5.0   4.4  2 X Average Risk   9.6   7.1  3 X Average Risk  23.4   11.0        Use the calculated Patient Ratio above and the CHD Risk Table to determine the patient's CHD Risk.        ATP III CLASSIFICATION (LDL):  <100     mg/dL   Optimal  026-378  mg/dL   Near or Above                    Optimal  130-159  mg/dL   Borderline  588-502  mg/dL   High  >774     mg/dL   Very High Performed at [facility], [address], [place], [place] [address]   Hemoglobin A1c     Status: Abnormal   Collection Time: 09/21/20  4:16 PM  Result Value Ref Range   Hgb A1c MFr Bld 10.7 (H) 4.8 - 5.6 %    Comment: (NOTE) Pre diabetes:          5.7%-6.4%  Diabetes:              >6.4%  Glycemic control for   <7.0% adults with diabetes    Mean Plasma Glucose 260.39 mg/dL    Comment: Performed at [facility], [address], [place], [place] [address]  CBG monitoring, ED  Status: Abnormal   Collection Time: 09/21/20  5:00 PM  Result Value Ref Range   Glucose-Capillary 286 (H) 70 - 99 mg/dL    Comment: Glucose reference range applies only to samples taken after fasting for at least 8 hours.  Glucose, capillary     Status: Abnormal   Collection Time: 09/21/20  8:12 PM  Result Value Ref Range   Glucose-Capillary 293 (H) 70 - 99 mg/dL    Comment: Glucose reference range applies only to samples taken after fasting for at least 8 hours.  Glucose, capillary     Status: Abnormal   Collection Time: 09/21/20 11:49 PM  Result Value Ref Range   Glucose-Capillary 272 (H) 70 - 99 mg/dL    Comment: Glucose reference range applies only to samples taken after fasting for at least 8 hours.  Glucose, capillary     Status: Abnormal   Collection Time: 09/22/20  4:03 AM  Result Value Ref Range   Glucose-Capillary 236 (H) 70 - 99 mg/dL    Comment: Glucose  reference range applies only to samples taken after fasting for at least 8 hours.  Glucose, capillary     Status: Abnormal   Collection Time: 09/22/20  7:54 AM  Result Value Ref Range   Glucose-Capillary 265 (H) 70 - 99 mg/dL    Comment: Glucose reference range applies only to samples taken after fasting for at least 8 hours.    MR FOOT LEFT WO CONTRAST  Result Date: 09/21/2020 CLINICAL DATA:  Diabetic patient who developed an infection of the third toe of the left foot a few weeks ago. Worsening pain and swelling. The patient reports purulent drainage from the toe. EXAM: MRI OF THE LEFT FOOT WITHOUT CONTRAST TECHNIQUE: Multiplanar, multisequence MR imaging of the foot was performed. No intravenous contrast was administered. COMPARISON:  Plain films left foot today. FINDINGS: Bones/Joint/Cartilage Marrow edema is seen throughout the third toe consistent with osteomyelitis. Destructive change of the tuft of the distal phalanx is noted. Edema is also seen in the head and neck of the third metatarsal. There is also marrow edema in the inferior 1.3 cm of the middle cuneiform with more patchy but more diffuse marrow edema in the medial cuneiform. Bone marrow signal is otherwise negative. No finding to suggest septic joint. Ligaments Appear intact. Muscles and Tendons There is complex appearing fluid and gas within the sheath of the extensor tendons of the third toe extending to the level of the mid lateral cuneiform highly suspicious for septic tenosynovitis. No intramuscular fluid collection is identified. 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 Soft tissues There is intense subcutaneous edema diffusely about the foot. Gas in the subcutaneous tissues about the distal third toe noted. No definite abscess is identified. IMPRESSION: Findings consistent with osteomyelitis in the head and neck of the third metatarsal and throughout the third toe. Gas and fluid  in the sheath of the extensor tendon to the third toe consistent with septic tenosynovitis. Marrow edema and enhancement in the middle and medial cuneiforms could be degenerative or due to stress change but given the extent of the patient's soft tissue infection, the finding is worrisome for osteomyelitis. Intense cellulitis about the foot. Electronically Signed   By: Drusilla Kanner M.D.   On: 09/21/2020 19:31   DG Foot Complete Left  Result Date: 09/21/2020 CLINICAL DATA:  Chronic wound infection involving the third toe. EXAM: LEFT FOOT - COMPLETE 3+ VIEW COMPARISON:  None FINDINGS: Extensive  soft tissue defect involving the third toe along with gas noted in the soft tissues. Only in the base of the distal phalanx remains. Findings could be due to chronic osteomyelitis or prior osteotomy/partially petitioned. Recommend clinical correlation. The DIP joint is maintained. I do not see findings suspicious for septic arthritis. The other bony structures are intact. No findings suspicious for septic arthritis or osteomyelitis elsewhere. Diffuse and marked subcutaneous soft tissue swelling/edema/fluid and some gas in the dorsal soft tissues of the foot. Calcaneal spurring changes. IMPRESSION: 1. Extensive soft tissue defect involving the third toe along with only a small portion of the distal phalanx identified. Findings could be due to chronic osteomyelitis or prior osteotomy/partial amputation. 2. No plain film findings for septic arthritis or osteomyelitis elsewhere. Electronically Signed   By: Rudie Meyer M.D.   On: 09/21/2020 11:03    ROS Blood pressure (!) 149/78, pulse 66, temperature 98.9 F (37.2 C), temperature source Oral, resp. rate 18, height 5\' 10"  (1.778 m), weight 108.7 kg, SpO2 98 %.  Vitals:   09/21/20 2346 09/22/20 0400  BP: (!) 148/73 (!) 149/78  Pulse: 69 66  Resp: 19 18  Temp: 98.8 F (37.1 C) 98.9 F (37.2 C)  SpO2: 98% 98%    General AA&O x3. Normal mood and affect.   Vascular Left foot warm and well perfused.  Neurologic Epicritic sensation grossly absent.  Dermatologic (Wound) Gangrene of left 3rd toe, ascending cellulitis. Warmth and erythema noted to foot Right foot without open ulceration  Orthopedic: Motor intact BLE.    Assessment/Plan:  Abscess Left foot, OM 3rd toe/metatarsal -Imaging: Studies independently reviewed -ABIs scheduled for today. -Antibiotics: Continue empiric -WB Status: Heel WB -Wound Care: leave dressing intact until surgery -Surgical Plan: Left foot incision and drainage, 3rd toe/partial metatarsal. Discussed likely need for RTOR at later date for further debridement and closure.  Continue NPO. Will continue to follow.  09/24/20 09/22/2020, 8:02 AM   Best available via secure chat for questions or concerns.

## 2020-09-22 NOTE — Progress Notes (Signed)
Orthopedic Tech Progress Note Patient Details:  Carl [name] 03/26/1965 [id number]  Went to apply CAM WALKER BOOT to patient. How his foot is wrapped, boot wouldn't close fully either boot TALL BLACK CAM WALKER or the SHORT CAM WALKER  Ortho Devices Type of Ortho Device: CAM walker Ortho Device/Splint Location: LLE Ortho Device/Splint Interventions: Ordered, Application, Adjustment   Post Interventions Patient Tolerated: Well Instructions Provided: Care of device  Donald Pore 09/22/2020, 6:52 PM

## 2020-09-22 NOTE — Op Note (Signed)
  Patient Name: Carl [name] DOB: 04/07/1965  MRN: [id number]   Date of Surgery: 09/22/20  Surgeon: Dr. Hardie Pulley, DPM Assistants: none  Pre-operative Diagnosis:  Abscess left foot Post-operative Diagnosis:  Same Procedures:  1) Left foot incision and drainage of complex abscess  2) 3rd toe amputation left Pathology/Specimens: ID Type Source Tests Collected by Time Destination  1 : Left third toe Tissue PATH Digit amputation SURGICAL PATHOLOGY Evelina Bucy, DPM 09/22/2020 1640   A : Abscess left Foot Abscess Abscess AEROBIC/ANAEROBIC CULTURE W GRAM STAIN (SURGICAL/DEEP WOUND) Evelina Bucy, DPM 09/22/2020 1642    Anesthesia: MAC/local Hemostasis: * No tourniquets in log * Estimated Blood Loss: 25 ml Materials: * No implants in log * Medications: Vancomycin 1 g topically Complications: None  Indications for Procedure:  This is a 55 y.o. male with a severe left foot infection. He presents for surgical management. All risks,benefits discussed.    Procedure in Detail: Patient was identified in pre-operative holding area. Formal consent was signed and the left lower extremity was marked. Patient was brought back to the operating room. Anesthesia was induced. The extremity was prepped and draped in the usual sterile fashion. Timeout was taken to confirm patient name, laterality, and procedure prior to incision.   Attention was then directed to the left foot.  The third toe was necrotic with significant purulence.  Abscess continued to the dorsal midfoot and probed at least 6 cm dorsally.  A racquet shaped incision was made about the third toe and the third toe was disarticulated at the metatarsophalangeal joint.  The third toe was grossly necrotic with black discoloration to the bone.  The metatarsal head appeared without gross infection.  The wound was then copiously debrided with a rongeur of all nonviable soft tissue to bleeding viable tissue.  The wound was then irrigated with  3 L normal saline.  Vancomycin powder was then applied topically to the wound bed. The foot was then dressed with Betadine soaked 4 x 4's, Kerlix, Ace bandage. Patient tolerated the procedure well.  Disposition: Following a period of post-operative monitoring, patient will be transferred home.  The metatarsal head appeared without gross infection though infection was noted on the MRI.  The third metatarsal head was not resected at this time so as to not expose the metatarsal canal to continued bacteria.  Likely will benefit from metatarsal resection later date.  He will benefit from return to the operating room later this week for debridement possible wound closure with or without third metatarsal resection

## 2020-09-22 NOTE — Progress Notes (Signed)
Inpatient Diabetes Program Recommendations  AACE/ADA: New Consensus Statement on Inpatient Glycemic Control (2015)  Target Ranges:  Prepandial:   less than 140 mg/dL      Peak postprandial:   less than 180 mg/dL (1-2 hours)      Critically ill patients:  140 - 180 mg/dL   Lab Results  Component Value Date   GLUCAP 265 (H) 09/22/2020   HGBA1C 10.7 (H) 09/21/2020    Review of Glycemic Control Results for ELO, MARMOLEJOS (MRN [id number]) as of 09/22/2020 09:17  Ref. Range 09/21/2020 17:00 09/21/2020 20:12 09/21/2020 23:49 09/22/2020 04:03 09/22/2020 07:54  Glucose-Capillary Latest Ref Range: 70 - 99 mg/dL 286 (H) 293 (H) 272 (H) 236 (H) 265 (H)   Diabetes history: DM 2 Outpatient Diabetes medications: None been out of meds for 6 months Current orders for Inpatient glycemic control:  Novolog 0-15 units tid  A1c 10.7% this admission  Inpatient Diabetes Program Recommendations:    -  Add lantus 15 units  Spoke with pt over the phone regarding A1c level and glucose control at home. Pt has been without the metformin and Invokana for 6 months now. He had gotten his A1c down from a 12 to 9% on oral meds. Pt also reporting being without a glucose meter for 3 months. Pt said he had eliminated extra sugar in diet with beverages and really watching carbohydrate intake because he was out of his meds but he said it didn't work as expected. Discussed glucose control needed for wound healing. Pt in agreement.  Based on A1c and need for wound healing recommend basal insulin at time of d/c. Pt agreeable and willing.  At time of d/c pt needs: Blood glucose meter kit order #[id number] Insulin Insulin pen needles/syringes Oral medication if desired  Thanks,  Tama Headings RN, MSN, BC-ADM Inpatient Diabetes Coordinator Team Pager [phone] (8a-5p)

## 2020-09-22 NOTE — Anesthesia Preprocedure Evaluation (Addendum)
Anesthesia Evaluation  Patient identified by MRN, date of birth, ID band Patient awake    Reviewed: Allergy & Precautions, NPO status , Patient's Chart, lab work & pertinent test results  History of Anesthesia Complications Negative for: history of anesthetic complications  Airway Mallampati: III  TM Distance: >3 FB Neck ROM: Full    Dental no notable dental hx. (+) Dental Advisory Given   Pulmonary neg pulmonary ROS, former smoker,    Pulmonary exam normal        Cardiovascular hypertension, Pt. on medications Normal cardiovascular exam     Neuro/Psych negative neurological ROS     GI/Hepatic negative GI ROS, Neg liver ROS,   Endo/Other  diabetes  Renal/GU negative Renal ROS     Musculoskeletal negative musculoskeletal ROS (+)   Abdominal   Peds  Hematology negative hematology ROS (+)   Anesthesia Other Findings   Reproductive/Obstetrics                            Anesthesia Physical Anesthesia Plan  ASA: 3  Anesthesia Plan: MAC   Post-op Pain Management:    Induction: Intravenous  PONV Risk Score and Plan: 1 and Ondansetron and Propofol infusion  Airway Management Planned: Natural Airway  Additional Equipment:   Intra-op Plan:   Post-operative Plan:   Informed Consent: I have reviewed the patients History and Physical, chart, labs and discussed the procedure including the risks, benefits and alternatives for the proposed anesthesia with the patient or authorized representative who has indicated his/her understanding and acceptance.     Dental advisory given  Plan Discussed with: Anesthesiologist and CRNA  Anesthesia Plan Comments:        Anesthesia Quick Evaluation

## 2020-09-22 NOTE — Transfer of Care (Signed)
Immediate Anesthesia Transfer of Care Note  Patient: Carl [name]  Procedure(s) Performed: INCISION AND DRAINAGE LEFT FOOT WITH REMOVAL OF ALL NON VIABLE SOFT TISSUE AND BONE INCLUDING THRID TOE AMPUTATION (Left)  Patient Location: PACU  Anesthesia Type:MAC  Level of Consciousness: awake, alert  and oriented  Airway & Oxygen Therapy: Patient Spontanous Breathing  Post-op Assessment: Report given to RN and Post -op Vital signs reviewed and stable  Post vital signs: Reviewed and stable  Last Vitals:  Vitals Value Taken Time  BP 140/86 09/22/20 1706  Temp    Pulse 73 09/22/20 1707  Resp 15 09/22/20 1707  SpO2 98 % 09/22/20 1707  Vitals shown include unvalidated device data.  Last Pain:  Vitals:   09/22/20 1442  TempSrc:   PainSc: 7       Patients Stated Pain Goal: 3 (70/96/28 3662)  Complications: No notable events documented.

## 2020-09-22 NOTE — TOC Initial Note (Signed)
Transition of Care [facility]) - Initial/Assessment Note    Patient Details  Name: Carl [name] MRN: [id number] Date of Birth: 12-May-1965  Transition of Care [facility]) CM/SW Contact:    Lockie Pares, RN Phone Number: 09/22/2020, 11:51 AM  Clinical Narrative:                  55 year old patient admitted with Left foot osteomyelitis., cellulitis. To OR today. Lives alone, has family to call on. . May need medication assistance. Will do MATCH if needed. CM will follow for needs  Expected Discharge Plan: Home/Self Care Barriers to Discharge: Continued Medical Work up   Patient Goals and CMS Choice        Expected Discharge Plan and Services Expected Discharge Plan: Home/Self Care   Discharge Planning Services: CM Consult   Living arrangements for the past 2 months: Single Family Home                                      Prior Living Arrangements/Services Living arrangements for the past 2 months: Single Family Home Lives with:: Self Patient language and need for interpreter reviewed:: Yes Do you feel safe going back to the place where you live?: Yes      Need for Family Participation in Patient Care: Yes (Comment) Care giver support system in place?: Yes (comment)   Criminal Activity/Legal Involvement Pertinent to Current Situation/Hospitalization: No - Comment as needed  Activities of Daily Living Home Assistive Devices/Equipment: None ADL Screening (condition at time of admission) Patient's cognitive ability adequate to safely complete daily activities?: Yes Is the patient deaf or have difficulty hearing?: No Does the patient have difficulty seeing, even when wearing glasses/contacts?: No Does the patient have difficulty concentrating, remembering, or making decisions?: No Patient able to express need for assistance with ADLs?: Yes Does the patient have difficulty dressing or bathing?: No Independently performs ADLs?: Yes (appropriate for developmental age) Does  the patient have difficulty walking or climbing stairs?: No Weakness of Legs: Left Weakness of Arms/Hands: None  Permission Sought/Granted                  Emotional Assessment       Orientation: : Oriented to Self, Oriented to Place, Oriented to  Time, Oriented to Situation Alcohol / Substance Use: Not Applicable Psych Involvement: No (comment)  Admission diagnosis:  Osteomyelitis (HCC) [M86.9] Osteomyelitis of left foot, unspecified type [facility]) [M86.9] Patient Active Problem List   Diagnosis Date Noted   Cellulitis and abscess of toe of left foot    Diabetic foot infection (HCC)    Osteomyelitis (HCC) 09/21/2020   Right-sided Bell's palsy 01/20/2015   Uncontrolled type 2 diabetes mellitus without complication, without long-term current use of insulin 01/19/2015   BMI 40.0-44.9, adult (HCC) 06/12/2011   Diabetes type 2, controlled (HCC) 06/12/2011   Hyperlipidemia 06/12/2011   Hypertension 06/12/2011   PCP:  Alicia Amel, MD Pharmacy:   [id number] [address]), [place] - [address] [place]) [place] [address] Phone: [phone] Fax: [phone]  [facility] - [place],  - [phone] [place] [address] Phone: [phone] Fax: [phone]  [facility] [address] [place] - [address] [place] [address] Phone: [phone] Fax: [phone]  [facility] [phone]  [place] [address] Phone: [phone] Fax: [phone]     Social Determinants of Health (SDOH) Interventions    Readmission Risk Interventions No flowsheet data found.

## 2020-09-22 NOTE — Progress Notes (Signed)
FPTS Brief Progress Note  S: Carl [name] was sleeping soundly during night rounds.  Discussed his care with his nurse, she reports that he had some postop pain but quickly fell asleep after administration of 5 mg oxycodone.  No other RN concerns.   O: BP (!) 148/73 (BP Location: Right Arm)   Pulse 79   Temp 99.9 F (37.7 C) (Oral)   Resp 18   Ht 5\' 10"  (1.778 m)   Wt 108.7 kg   SpO2 99%   BMI 34.38 kg/m   General: Middle-aged male appears stated age, sleeping in hospital bed Pulm: Breathing unlabored on room air Extremity: LLE with post-surgical bandage in place   A/P: Carl [name] is a 55 year old man with a history of uncontrolled diabetes, hypertension, hyperlipidemia who presented for necrosis and cellulitis of the left third toe, found to have underlying osteomyelitis, now status post I&D and third toe amputation.  Osteomyelitis  Cellulitis  Septic Tenosynovitis  Postop day 0.  Blood cultures with no growth at 24 hours.  Deep wound cultures pending. -Continue vancomycin, Flagyl, cefepime, narrow once cultures returned -Currently receiving oxycodone 5 mg every 4 hours for pain, will consider escalating his pain control regimen in the postop period as needed -Plan for day team to start him on antihypertensives tomorrow morning  - Orders reviewed. Labs for AM ordered, which was adjusted as needed.  - If condition changes, plan includes escalation of post-op pain control regimen.   58, MD 09/22/2020, 10:52 PM PGY-1, [facility] Night Resident  Please page [phone] with questions.

## 2020-09-22 NOTE — Telephone Encounter (Signed)
Greatly appreciative of podiatry's assistance.

## 2020-09-22 NOTE — Hospital Course (Addendum)
[name] is a 55 year old male who presented with worsening necrosis of the medial toe of the left foot with overlying cellulitis.  PMH is significant for HTN, T2MD and hyperlipidemia.  He has not been on medication for over a year.  Left foot osteomyelitis with cellulitis Patient presented in the ED on 7/12 with worsening infection of the middle toe on the left foot.  Patient said he started noticing infection of the middle toe in he left foot 2 weeks ago.  He described it as being red and swollen but over time he started noticing the tissue getting darker with worsening pain.  About 2 days ago the pain started becoming unbearable and he noticed the red coloration from the toe spreading up his foot closer to his ankle.  The skin overlying the toe became a lot darker and drains foul-smelling fluid.  Patient indicated that he was treating the wound with hydrogen peroxide to prevent infection.  On arrival in the ED patient was initially treated with antibiotics of 2500mg  of IV Vanco vancomycin and 3.375g of Zosyn.  He also received 5mg  of Dilaudid for pain control.  MRI, blood culture and ABI were ordered.  His initial abs showed lactic acid within normal limits and leukocytosis of 14.1 WBC.  MRI study showed moderate edema of the middle toe which is consistent with osteomyelitis and also positive for fluid and gas within the extensor tendon sheath of the middle toe.  Following MRI finding patient's antibiotics include IV cefepime 2g, Zosyn 3.375 g, and vancomycin 2500 mg. Podiatry was consulted and performed incision and drained with removal of non viable tissue and bone, including amputation of the third toe on 7/13. His pain was well controlled and his overlying cellulitis much improved with IV antibiotics. He was taken back to the OR on 7/17 and had a surgical cure of his osteomyelitis. Antibiotics were narrowed to oral cefdinir based on deep wound cultures and he was deemed stable for discharge on  7/19.  Diabetes [name] reported being without medication for several months prior to hospitalization. His A1c on admission was  10.7. In the past he has taken metformin and canagliflozin. He was treating with basal and sliding scale insulin in the hospital and was restarted on metformin on 7/18. He was discharged on metformin and glipizide due to the affordability of these meds with no insurance.   HTN As above, [name] has not had any controller meds in several months. He was persistently hypertensive in the hospital and was started on Losartan, first at 25mg  and then titrated to 50mg  daily.    Discharge recommendation HTN: Started on losartan while inpatient. PCP to follow up BP and make adjustments as indicated.  DM: A1c on admission 10.7. Discharged on metformin, glipizide. PCP to follow up and titrate.  PAD: Dx during hospital stay based on ABI. Started on aspirin 81 mg daily and atorvastatin 20mg  daily. PCP to follow.

## 2020-09-22 NOTE — Progress Notes (Signed)
Family Medicine Teaching Service Daily Progress Note Intern Pager: [phone]  Patient name: Carl [name] Medical record number: [id number] Date of birth: 08-Mar-1966 Age: 55 y.o. Gender: male  Primary Care Provider: Alicia Amel, MD Consultants: Podiatry Code Status: Full  Pt Overview and Major Events to Date:  7/12 admitted  Assessment and Plan: Carl [name] is a 55 year old male who was admitted for necrosis of the third toe in the left foot with worsening cellulitis.  PMH is significant for uncontrolled diabetes, hypertension, and hyperlipidemia.  L foot osteomyelitis with cellulitis MRI of the left foot shows head and neck osteomyelitis of the third metatarsal bone in the left foot.  Presence of gas and fluid in the tendon sheath was consistent for septic tendinitis. Patient indicated the pain in left leg is only worse with movement. -Podiatry to see patient today -Continue n.p.o., pending surgery schedule -f/u ABI  -f/uBlood culture -Continue Cefepime 2g -Continue vancomycin -start Flagyl 500mg   -Tylenol 650 mg PRN  Type 2 diabetes CBG this morning was 265. -Continue sliding scale insulin -Continue monitoring CBG -Monitor potassium level  Hypertension Last BP was 149/78.  Patient is being seen by a podiatrist and possible surgery scheduled for today. -Continue to hold blood pressure medication pending surgery -discuss getting a PCP for f/u -consider starting patient on Losartan before d/c.  Hyperlipidemia Lipid panel was normal aside from low HDL of 24.  -Plan is to discharge patient on atorvastatin  FEN/GI: N.p.o. PPx: SCDs Dispo:Home pending clinical improvement . Barriers include scheduled for surgery.   Subjective:  Carl [name] said he is doing well and not IN acute distress at this time.  Pain is only significant with walking.  Objective: Temp:  [98.6 F (37 C)-99.1 F (37.3 C)] 98.9 F (37.2 C) (07/13 0400) Pulse Rate:  [66-100] 66 (07/13 0400) Resp:   [14-25] 18 (07/13 0400) BP: (142-179)/(64-91) 149/78 (07/13 0400) SpO2:  [96 %-100 %] 98 % (07/13 0400) Weight:  [107.5 kg-108.7 kg] 108.7 kg (07/12 1831) Physical Exam: General: Alert, NAD Cardiovascular: Normal rhythm and rate, No murmurs Respiratory: Clear breath sounds, No wheezing or rales Abdomen: No abdominal distension or tenderness Extremities: left foot erythema, tenderness and swelling present.    Laboratory: Recent Labs  Lab 09/21/20 1030  WBC 14.1*  HGB 12.1*  HCT 36.2*  PLT 286   Recent Labs  Lab 09/21/20 1030  NA 131*  K 3.8  CL 96*  CO2 24  BUN 11  CREATININE 0.64  CALCIUM 8.6*  PROT 7.0  BILITOT 0.9  ALKPHOS 94  ALT 25  AST 19  GLUCOSE 322*     Imaging/Diagnostic Tests: No new image studies  11/22/20, MD 09/22/2020, 8:13 AM PGY-1, [place] [facility] FPTS Intern pager: [phone], text pages welcome

## 2020-09-22 NOTE — Anesthesia Procedure Notes (Signed)
Procedure Name: MAC Date/Time: 09/22/2020 4:26 PM Performed by: Dorthea Cove, CRNA Pre-anesthesia Checklist: Patient identified, Suction available, Emergency Drugs available, Patient being monitored and Timeout performed Patient Re-evaluated:Patient Re-evaluated prior to induction Oxygen Delivery Method: Nasal cannula Preoxygenation: Pre-oxygenation with 100% oxygen Induction Type: IV induction Placement Confirmation: CO2 detector and positive ETCO2 Dental Injury: Teeth and Oropharynx as per pre-operative assessment

## 2020-09-23 ENCOUNTER — Encounter (HOSPITAL_COMMUNITY): Payer: Self-pay | Admitting: Podiatry

## 2020-09-23 LAB — BASIC METABOLIC PANEL
Anion gap: 8 (ref 5–15)
BUN: 10 mg/dL (ref 6–20)
CO2: 25 mmol/L (ref 22–32)
Calcium: 8.4 mg/dL — ABNORMAL LOW (ref 8.9–10.3)
Chloride: 100 mmol/L (ref 98–111)
Creatinine, Ser: 0.53 mg/dL — ABNORMAL LOW (ref 0.61–1.24)
GFR, Estimated: >60 mL/min (ref >60)
Glucose, Bld: 252 mg/dL — ABNORMAL HIGH (ref 70–99)
Potassium: 4.1 mmol/L (ref 3.5–5.1)
Sodium: 133 mmol/L — ABNORMAL LOW (ref 135–145)

## 2020-09-23 LAB — CBC
HCT: 34.7 % — ABNORMAL LOW (ref 39.0–52.0)
Hemoglobin: 11.2 g/dL — ABNORMAL LOW (ref 13.0–17.0)
MCH: 27.6 pg (ref 26.0–34.0)
MCHC: 32.3 g/dL (ref 30.0–36.0)
MCV: 85.5 fL (ref 80.0–100.0)
Platelets: 280 10*3/uL (ref 150–400)
RBC: 4.06 MIL/uL — ABNORMAL LOW (ref 4.22–5.81)
RDW: 13.6 % (ref 11.5–15.5)
WBC: 11.6 10*3/uL — ABNORMAL HIGH (ref 4.0–10.5)
nRBC: 0 % (ref 0.0–0.2)

## 2020-09-23 LAB — GLUCOSE, CAPILLARY
Glucose-Capillary: 247 mg/dL — ABNORMAL HIGH (ref 70–99)
Glucose-Capillary: 293 mg/dL — ABNORMAL HIGH (ref 70–99)
Glucose-Capillary: 284 mg/dL — ABNORMAL HIGH (ref 70–99)
Glucose-Capillary: 243 mg/dL — ABNORMAL HIGH (ref 70–99)

## 2020-09-23 MED ORDER — ACETAMINOPHEN 325 MG PO TABS
650.0000 mg | ORAL_TABLET | Freq: Four times a day (QID) | ORAL | Status: DC | PRN
  Administered 2020-09-23 – 2020-09-28 (×4): 650 mg via ORAL
  Filled 2020-09-23 (×4): qty 2

## 2020-09-23 MED ORDER — CHLORHEXIDINE GLUCONATE CLOTH 2 % EX PADS
6.0000 | MEDICATED_PAD | Freq: Every day | CUTANEOUS | Status: AC
  Administered 2020-09-24 – 2020-09-28 (×5): 6 via TOPICAL

## 2020-09-23 MED ORDER — DAKINS (1/4 STRENGTH) 0.125 % EX SOLN
1.0000 "application " | Freq: Two times a day (BID) | CUTANEOUS | Status: AC
  Administered 2020-09-24 – 2020-09-25 (×4): 1
  Filled 2020-09-23: qty 473

## 2020-09-23 MED ORDER — OXYCODONE HCL 5 MG PO TABS
5.0000 mg | ORAL_TABLET | Freq: Four times a day (QID) | ORAL | Status: DC | PRN
  Administered 2020-09-23 – 2020-09-26 (×10): 5 mg via ORAL
  Filled 2020-09-23 (×10): qty 1

## 2020-09-23 MED ORDER — MUPIROCIN 2 % EX OINT
1.0000 "application " | TOPICAL_OINTMENT | Freq: Two times a day (BID) | CUTANEOUS | Status: DC
  Administered 2020-09-23 – 2020-09-28 (×9): 1 via NASAL
  Filled 2020-09-23 (×2): qty 22

## 2020-09-23 NOTE — Progress Notes (Signed)
Inpatient Diabetes Program Recommendations  AACE/ADA: New Consensus Statement on Inpatient Glycemic Control (2015)  Target Ranges:  Prepandial:   less than 140 mg/dL      Peak postprandial:   less than 180 mg/dL (1-2 hours)      Critically ill patients:  140 - 180 mg/dL   Lab Results  Component Value Date   GLUCAP 247 (H) 09/23/2020   HGBA1C 10.7 (H) 09/21/2020    Review of Glycemic Control Results for Carl, [name] (MRN [id number]) as of 09/23/2020 08:30  Ref. Range 09/22/2020 07:54 09/22/2020 11:11 09/22/2020 15:28 09/22/2020 17:54 09/22/2020 19:51 09/22/2020 22:12 09/23/2020 06:01  Glucose-Capillary Latest Ref Range: 70 - 99 mg/dL 265 (H) 253 (H) 196 (H) 165 (H) 236 (H) 305 (H) 247 (H)    Diabetes history: DM 2 Outpatient Diabetes medications: None been out of meds for 6 months Current orders for Inpatient glycemic control:  Novolog 0-15 units tid  A1c 10.7% this admission  Inpatient Diabetes Program Recommendations:    -  Add lantus 15 units  Spoke with pt on 7/13 see note for details. Based on A1c and need for wound healing recommend basal insulin at time of d/c. Pt agreeable and willing.  At time of d/c pt needs: Blood glucose meter kit order #[id number] Insulin Insulin pen needles/syringes Oral medication if desired  Thanks,  Tama Headings RN, MSN, BC-ADM Inpatient Diabetes Coordinator Team Pager [phone] (8a-5p)

## 2020-09-23 NOTE — Evaluation (Signed)
Physical Therapy Evaluation/Discharge Patient Details Name: Carl [name] MRN: [id number] DOB: 07-27-1965 Today's Date: 09/23/2020   History of Present Illness  55 y.o. male admitted on 09/21/20 for L foot osteomyelitis and cellulits s/p L middle toe I and D and amputation on 09/22/20.  Likely to have another I and D prior to d/c.  Pt with significant PMH of DM, HTN, and hand surgery.  Clinical Impression  Pt was proficient with gait with SPC.  Pt was unable to donn CAM boot due to the bulk of his dressing, however, he said physician would be by today to look at his foot and he would request less bulk if he can.  He does not currently need acute PT as he is mod I with a cane.  He has support of his wife at home and is hopeful to heal his foot and return to work.  PT to sign off.     Follow Up Recommendations No PT follow up    Equipment Recommendations  Other (comment) (recommended he get a cane at the drugstore at d/c)    Recommendations for Other Services       Precautions / Restrictions Precautions Required Braces or Orthoses: Other Brace Other Brace: CAM boot left, however, dressing is too big for it to fit right now. Restrictions Other Position/Activity Restrictions: no specific orders for WB, so encouraged pt to try to WB through his heel during gait.      Mobility  Bed Mobility Overal bed mobility: Independent                  Transfers Overall transfer level: Modified independent                  Ambulation/Gait Ambulation/Gait assistance: Modified independent (Device/Increase time) Gait Distance (Feet): 300 Feet Assistive device: Straight cane Gait Pattern/deviations: Step-to pattern;Antalgic     General Gait Details: Encouraged WB through heel, especially since his CAM boot can't fit with the bulk of his dressing.  He was able to show prficiency with gait with the cane and we verbally reviewed stair sequencing.  He reports he has been using the rail for  safety during stairs.  Stairs Stairs: Yes       General stair comments: verbally reviewed up with the good down with the sore one and recommended use of rail.  Wheelchair Mobility    Modified Rankin (Stroke Patients Only)       Balance                                             Pertinent Vitals/Pain Pain Assessment: Faces Faces Pain Scale: Hurts even more Pain Location: left foot Pain Descriptors / Indicators: Grimacing;Guarding Pain Intervention(s): Limited activity within patient's tolerance;Monitored during session;Repositioned    Home Living Family/patient expects to be discharged to:: Private residence Living Arrangements: Spouse/significant other Available Help at Discharge: Family Type of Home: House Home Access: Stairs to enter Entrance Stairs-Rails: Right Entrance Stairs-Number of Steps: 4 Home Layout: One level        Prior Function Level of Independence: Independent         Comments: work Copy with a laser, 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  Increased lower leg edema.  Pt has been proping foot up on pillows. LLE Sensation: WNL    Cervical / Trunk Assessment Cervical / Trunk Assessment: Normal  Communication   Communication: No difficulties  Cognition Arousal/Alertness: Awake/alert Behavior During Therapy: WFL for tasks assessed/performed Overall Cognitive Status: Within Functional Limits for tasks assessed                                        General Comments      Exercises     Assessment/Plan    PT Assessment Patent does not need any further PT services  PT Problem List          PT Treatment Interventions      PT Goals (Current goals can be found in the Care Plan section)  Acute Rehab PT Goals PT Goal Formulation: All assessment and education complete, DC therapy    Frequency     Barriers to discharge        Co-evaluation               AM-PAC PT "6 Clicks" Mobility  Outcome Measure Help needed turning from your back to your side while in a flat bed without using bedrails?: None Help needed moving from lying on your back to sitting on the side of a flat bed without using bedrails?: None Help needed moving to and from a bed to a chair (including a wheelchair)?: None Help needed standing up from a chair using your arms (e.g., wheelchair or bedside chair)?: None Help needed to walk in hospital room?: None Help needed climbing 3-5 steps with a railing? : None 6 Click Score: 24    End of Session   Activity Tolerance: Patient limited by pain Patient left: in bed;with call bell/phone within reach Nurse Communication: Mobility status PT Visit Diagnosis: Difficulty in walking, not elsewhere classified (R26.2);Pain Pain - Right/Left: Left Pain - part of body: Ankle and joints of foot    Time: 1549-1609 PT Time Calculation (min) (ACUTE ONLY): 20 min   Charges:   PT Evaluation $PT Eval Moderate Complexity: 1 Mod         Corinna Capra, PT, DPT  Acute Rehabilitation Ortho Tech Supervisor [phone] pager [phone]) [phone] office

## 2020-09-23 NOTE — Progress Notes (Signed)
Subjective:  Patient ID: Carl [name], male    DOB: March 06, 1966,  MRN: [id number]  Patient seen bedside, pain issues today but denies other complaints. Was able to get up and walk with PT.  Objective:   Vitals:   09/23/20 1127 09/23/20 1639  BP: (!) 146/83 (!) 155/80  Pulse: 64 72  Resp: 18 18  Temp: 98.3 F (36.8 C) 98.2 F (36.8 C)  SpO2: 92% 97%    General AA&O x3. Normal mood and affect.  Vascular Left foot warm and well perfused.  Neurologic Epicritic sensation grossly absent.  Dermatologic (Wound) Surgical wound left foot with continued purulence. Receding cellulitis, warmth. No further ganrene noted, wound base fibronecrotic.  Orthopedic: Motor intact BLE.    Results for orders placed or performed during the hospital encounter of 09/21/20  Urine culture     Status: None   Collection Time: 09/21/20 10:13 AM   Specimen: In/Out Cath Urine  Result Value Ref Range Status   Specimen Description IN/OUT CATH URINE  Final   Special Requests NONE  Final   Culture   Final    NO GROWTH Performed at [facility], [address] [place], [place] [address]    Report Status 09/22/2020 FINAL  Final  Blood Culture (routine x 2)     Status: None (Preliminary result)   Collection Time: 09/21/20 10:30 AM   Specimen: BLOOD RIGHT ARM  Result Value Ref Range Status   Specimen Description BLOOD RIGHT ARM  Final   Special Requests   Final    BOTTLES DRAWN AEROBIC AND ANAEROBIC Blood Culture adequate volume   Culture   Final    NO GROWTH 2 DAYS Performed at [facility], [address] [place], [place] [address]    Report Status PENDING  Incomplete  Blood Culture (routine x 2)     Status: None (Preliminary result)   Collection Time: 09/21/20 10:35 AM   Specimen: BLOOD RIGHT ARM  Result Value Ref Range Status   Specimen Description BLOOD RIGHT ARM  Final   Special Requests   Final    BOTTLES DRAWN AEROBIC AND ANAEROBIC Blood Culture adequate volume   Culture   Final     NO GROWTH 2 DAYS Performed at [facility], [address] [place], [place] [address]    Report Status PENDING  Incomplete  Resp Panel by RT-PCR (Flu A&B, Covid) Nasopharyngeal Swab     Status: None   Collection Time: 09/21/20 12:47 PM   Specimen: Nasopharyngeal Swab; Nasopharyngeal(NP) swabs in vial transport medium  Result Value Ref Range Status   SARS Coronavirus 2 by RT PCR NEGATIVE NEGATIVE Final    Comment: (NOTE) SARS-CoV-2 target nucleic acids are NOT DETECTED.  The SARS-CoV-2 RNA is generally detectable in upper respiratory specimens during the acute phase of infection. The lowest concentration of SARS-CoV-2 viral copies this assay can detect is 138 copies/mL. A negative result does not preclude SARS-Cov-2 infection and should not be used as the sole basis for treatment or other patient management decisions. A negative result may occur with  improper specimen collection/handling, submission of specimen other than nasopharyngeal swab, presence of viral mutation(s) within the areas targeted by this assay, and inadequate number of viral copies(<138 copies/mL). A negative result must be combined with clinical observations, patient history, and epidemiological information. The expected result is Negative.  Fact Sheet for Patients:  BloggerCourse.com  Fact Sheet for Healthcare Providers:  SeriousBroker.it  This test is no t  yet approved or cleared by the [place] and  has been authorized for detection and/or diagnosis of SARS-CoV-2 by FDA under an Emergency Use Authorization (EUA). This EUA will remain  in effect (meaning this test can be used) for the duration of the COVID-19 declaration under Section 564(b)(1) of the Act, 21 U.S.C.section 360bbb-3(b)(1), unless the authorization is terminated  or revoked sooner.       Influenza A by PCR NEGATIVE NEGATIVE Final   Influenza B by PCR NEGATIVE NEGATIVE Final     Comment: (NOTE) The Xpert Xpress SARS-CoV-2/FLU/RSV plus assay is intended as an aid in the diagnosis of influenza from Nasopharyngeal swab specimens and should not be used as a sole basis for treatment. Nasal washings and aspirates are unacceptable for Xpert Xpress SARS-CoV-2/FLU/RSV testing.  Fact Sheet for Patients: BloggerCourse.com  Fact Sheet for Healthcare Providers: SeriousBroker.it  This test is not yet approved or cleared by the [place] FDA and has been authorized for detection and/or diagnosis of SARS-CoV-2 by FDA under an Emergency Use Authorization (EUA). This EUA will remain in effect (meaning this test can be used) for the duration of the COVID-19 declaration under Section 564(b)(1) of the Act, 21 U.S.C. section 360bbb-3(b)(1), unless the authorization is terminated or revoked.  Performed at [facility], [address], [place], [place] [address]   Surgical pcr screen     Status: Abnormal   Collection Time: 09/22/20  5:50 AM   Specimen: Nasal Mucosa; Nasal Swab  Result Value Ref Range Status   MRSA, PCR NEGATIVE NEGATIVE Final   Staphylococcus aureus POSITIVE (A) NEGATIVE Final    Comment: (NOTE) The Xpert SA Assay (FDA approved for NASAL specimens in patients 74 years of age and older), is one component of a comprehensive surveillance program. It is not intended to diagnose infection nor to guide or monitor treatment. Performed at [facility], [address] [place], [place] [address]   Aerobic/Anaerobic Culture w Gram Stain (surgical/deep wound)     Status: None (Preliminary result)   Collection Time: 09/22/20  4:42 PM   Specimen: Abscess  Result Value Ref Range Status   Specimen Description ABSCESS  Final   Special Requests ABSCESS LEFT FOOT SPEC A  Final   Gram Stain   Final    RARE WBC PRESENT,BOTH PMN AND MONONUCLEAR FEW GRAM NEGATIVE COCCOBACILLI RARE GRAM POSITIVE COCCI IN  PAIRS    Culture   Final    CULTURE REINCUBATED FOR BETTER GROWTH Performed at [facility], [address] [place], [place] [address]    Report Status PENDING  Incomplete    Assessment & Plan:  Patient was evaluated and treated and all questions answered.  Left foot osteomyelitis, abscess s/p I and D -Dressing changed today. Continued purulence. Flushed and repacked with 4x4s. Gently pack wound with 1/4 strength Dakin's solution - change daily. -Continue empiric abx. Multiple organisms on GS, culture pending -Heel WB in surgical shoe -Plan for RTOR for debridement, 3rd ray resection, and closure. Planning for Saturday/Sunday once wound stable/ improved. -Labs reviewed. Will trend WBCs.  Park Liter, DPM  Accessible via secure chat for questions or concerns.

## 2020-09-23 NOTE — Progress Notes (Addendum)
Family Medicine Teaching Service Daily Progress Note Intern Pager: [phone]  Patient name: Carl [name] Medical record number: [id number] Date of birth: [birth year] Age: 55 y.o. Gender: male  Primary Care Provider: Alicia Amel, MD Consultants: Podiatry Code Status: Full  Pt Overview and Major Events to Date:  7/12- admitted 7/13- Left middle toe I&D, Amputation  Assessment and Plan:  Carl [name] is a 55 year old male who presented with left middle toe osteomyelitis with overlying cellulitis of the left foot.  PMH is significant for uncontrolled T2DM, HTN and HLD.  Left foot osteomyelitis Cellulities Patient's MRI on admission was positive for osteomyelitis of the left third metatarsal bone with presence of gas and fluid in the tendon sheath.  He is post op day 1.Podiatry performed I&D and amputation of the left middle toe. Patient likely to have a second procedure pending podiatry decision. He said his pain is well controlled and not in acute distress. -Podiatry following, appreciate rec -F/U ABI -F/U blood culture -Continues cefepime 2 g  -Continue vancomycin -Continue Flagyl 500 mg -adjust oxycodone 5 mg to q6h  Type 2 diabetes Patient has poorly controlled diabetes.  He is not on any home medication.  CBG this morning was 247.  We will look to establish PCP for outpatient management. -Continue daily CBGs -Continue sliding scale insulin -Continue Lantus 10 units -Discussed establishing PCP   Hypertension Patient is not on BP home medication.  We will start BP medication pending a second podiatry procedure.  BP this morning 143/79 -Consider Losartan if bp stays high -Monitor daily BP  Hyperlipidemia Patient has not any home medication for HLD. -Continue atorvastatin 20 mg   FEN/GI: Carb modified diet PPx: Heparin SQ Dispo:Home pending clinical improvement . Barriers include second podiatry procedure  Subjective:  Patient is laying supine in bed and appears to be in  good spirit.  He denies any fever or chills, headache, and nausea or vomiting.  Objective: Temp:  [97.9 F (36.6 C)-100.4 F (38 C)] 98.7 F (37.1 C) (07/14 0415) Pulse Rate:  [65-79] 65 (07/14 0415) Resp:  [14-21] 18 (07/14 0415) BP: (134-176)/(65-96) 143/79 (07/14 0415) SpO2:  [97 %-100 %] 99 % (07/14 0415) Physical Exam: General: Laying supine, alert and NAD Cardiovascular: RRR, no murmurs Respiratory: Clear breath sounds bilaterally, no wheezing, no rales Abdomen: No abdominal tenderness or distention Extremities: Postop dressing on foot left foot is dry. Moderate swelling of the surrounding toes in the left foot.    Laboratory: Recent Labs  Lab 09/21/20 1030 09/22/20 0741 09/23/20 0427  WBC 14.1* 10.6* 11.6*  HGB 12.1* 10.8* 11.2*  HCT 36.2* 32.9* 34.7*  PLT 286 263 280   Recent Labs  Lab 09/21/20 1030 09/22/20 0741 09/23/20 0427  NA 131* 134* 133*  K 3.8 4.3 4.1  CL 96* 101 100  CO2 24 26 25   BUN 11 8 10   CREATININE 0.64 0.54* 0.53*  CALCIUM 8.6* 8.3* 8.4*  PROT 7.0  --   --   BILITOT 0.9  --   --   ALKPHOS 94  --   --   ALT 25  --   --   AST 19  --   --   GLUCOSE 322* 275* 252*     Imaging/Diagnostic Tests: No image studies  , MD 09/23/2020, 6:31 AM PGY-1, [facility] FPTS Intern pager: [phone], text pages welcome

## 2020-09-23 NOTE — Anesthesia Postprocedure Evaluation (Signed)
Anesthesia Post Note  Patient: Carl [name]  Procedure(s) Performed: INCISION AND DRAINAGE LEFT FOOT WITH REMOVAL OF ALL NON VIABLE SOFT TISSUE AND BONE INCLUDING THRID TOE AMPUTATION (Left)     Patient location during evaluation: PACU Anesthesia Type: MAC Level of consciousness: awake and alert, awake and oriented Pain management: pain level controlled Vital Signs Assessment: post-procedure vital signs reviewed and stable Respiratory status: spontaneous breathing, nonlabored ventilation, respiratory function stable and patient connected to nasal cannula oxygen Cardiovascular status: stable and blood pressure returned to baseline Postop Assessment: no apparent nausea or vomiting Anesthetic complications: no   No notable events documented.  Last Vitals:  Vitals:   09/23/20 0235 09/23/20 0415  BP:  (!) 143/79  Pulse:  65  Resp:  18  Temp: 37.1 C 37.1 C  SpO2:  99%    Last Pain:  Vitals:   09/23/20 0737  TempSrc:   PainSc: 5                  Catalina Gravel

## 2020-09-24 ENCOUNTER — Ambulatory Visit: Payer: Self-pay | Admitting: Podiatry

## 2020-09-24 DIAGNOSIS — Z9889 Other specified postprocedural states: Secondary | ICD-10-CM

## 2020-09-24 DIAGNOSIS — E11628 Type 2 diabetes mellitus with other skin complications: Secondary | ICD-10-CM

## 2020-09-24 DIAGNOSIS — M86172 Other acute osteomyelitis, left ankle and foot: Secondary | ICD-10-CM

## 2020-09-24 LAB — BASIC METABOLIC PANEL
Anion gap: 8 (ref 5–15)
BUN: 7 mg/dL (ref 6–20)
CO2: 27 mmol/L (ref 22–32)
Calcium: 8.4 mg/dL — ABNORMAL LOW (ref 8.9–10.3)
Chloride: 100 mmol/L (ref 98–111)
Creatinine, Ser: 0.49 mg/dL — ABNORMAL LOW (ref 0.61–1.24)
GFR, Estimated: >60 mL/min (ref >60)
Glucose, Bld: 224 mg/dL — ABNORMAL HIGH (ref 70–99)
Potassium: 3.7 mmol/L (ref 3.5–5.1)
Sodium: 135 mmol/L (ref 135–145)

## 2020-09-24 LAB — CBC
HCT: 31.9 % — ABNORMAL LOW (ref 39.0–52.0)
Hemoglobin: 10.5 g/dL — ABNORMAL LOW (ref 13.0–17.0)
MCH: 27.8 pg (ref 26.0–34.0)
MCHC: 32.9 g/dL (ref 30.0–36.0)
MCV: 84.4 fL (ref 80.0–100.0)
Platelets: 283 10*3/uL (ref 150–400)
RBC: 3.78 MIL/uL — ABNORMAL LOW (ref 4.22–5.81)
RDW: 13.6 % (ref 11.5–15.5)
WBC: 7.5 10*3/uL (ref 4.0–10.5)
nRBC: 0 % (ref 0.0–0.2)

## 2020-09-24 LAB — GLUCOSE, CAPILLARY
Glucose-Capillary: 216 mg/dL — ABNORMAL HIGH (ref 70–99)
Glucose-Capillary: 270 mg/dL — ABNORMAL HIGH (ref 70–99)
Glucose-Capillary: 286 mg/dL — ABNORMAL HIGH (ref 70–99)
Glucose-Capillary: 259 mg/dL — ABNORMAL HIGH (ref 70–99)

## 2020-09-24 LAB — SURGICAL PATHOLOGY

## 2020-09-24 MED ORDER — INSULIN GLARGINE 100 UNIT/ML ~~LOC~~ SOLN
5.0000 [IU] | Freq: Once | SUBCUTANEOUS | Status: AC
  Administered 2020-09-24: 5 [IU] via SUBCUTANEOUS
  Filled 2020-09-24 (×2): qty 0.05

## 2020-09-24 MED ORDER — INSULIN GLARGINE 100 UNIT/ML ~~LOC~~ SOLN
15.0000 [IU] | Freq: Every day | SUBCUTANEOUS | Status: DC
  Administered 2020-09-25 – 2020-09-28 (×4): 15 [IU] via SUBCUTANEOUS
  Filled 2020-09-24 (×5): qty 0.15

## 2020-09-24 MED ORDER — ASPIRIN EC 81 MG PO TBEC
81.0000 mg | DELAYED_RELEASE_TABLET | Freq: Every day | ORAL | Status: DC
  Administered 2020-09-24 – 2020-09-28 (×5): 81 mg via ORAL
  Filled 2020-09-24 (×5): qty 1

## 2020-09-24 MED ORDER — LOSARTAN POTASSIUM 25 MG PO TABS
25.0000 mg | ORAL_TABLET | Freq: Every day | ORAL | Status: DC
  Administered 2020-09-24 – 2020-09-27 (×4): 25 mg via ORAL
  Filled 2020-09-24 (×4): qty 1

## 2020-09-24 NOTE — Progress Notes (Addendum)
   09/24/20 1817  Vitals  Temp 98.4 F (36.9 C)  Temp Source Oral  BP (!) 195/91  MAP (mmHg) 117  BP Location Left Arm  BP Method Automatic  Patient Position (if appropriate) Sitting  Pulse Rate 67  Resp 16  MEWS COLOR  MEWS Score Color Green  Oxygen Therapy  SpO2 100 %  O2 Device Room Air   Patient with elevated B/P, pt denied any distress. No PRN b/p med on MAR. Oncall MD aware and wants a repeat b/p. Oncoming Rn aware.

## 2020-09-24 NOTE — H&P (View-Only) (Signed)
Subjective:  Patient ID: Carl [name], male    DOB: 08/08/1965,  MRN: [id number]  Patient seen bedside, denies new complaints today.  Objective:   Vitals:   09/24/20 1213 09/24/20 1817  BP: (!) 141/71 (!) 195/91  Pulse: 67 67  Resp: 16 16  Temp: 98.1 F (36.7 C) 98.4 F (36.9 C)  SpO2: 100% 100%    General AA&O x3. Normal mood and affect.  Vascular Left foot warm and well perfused.  Neurologic Epicritic sensation grossly absent.  Dermatologic (Wound) Surgical wound left foot with mild continued purulence. Receding cellulitis, warmth. No further ganrene noted, wound base fibronecrotic.  Orthopedic: Motor intact BLE.    Results for orders placed or performed during the hospital encounter of 09/21/20  Urine culture     Status: None   Collection Time: 09/21/20 10:13 AM   Specimen: In/Out Cath Urine  Result Value Ref Range Status   Specimen Description IN/OUT CATH URINE  Final   Special Requests NONE  Final   Culture   Final    NO GROWTH Performed at [facility], [address] [place], [place] [address]    Report Status 09/22/2020 FINAL  Final  Blood Culture (routine x 2)     Status: None (Preliminary result)   Collection Time: 09/21/20 10:30 AM   Specimen: BLOOD RIGHT ARM  Result Value Ref Range Status   Specimen Description BLOOD RIGHT ARM  Final   Special Requests   Final    BOTTLES DRAWN AEROBIC AND ANAEROBIC Blood Culture adequate volume   Culture   Final    NO GROWTH 3 DAYS Performed at [facility], [address] [place], [place] [address]    Report Status PENDING  Incomplete  Blood Culture (routine x 2)     Status: None (Preliminary result)   Collection Time: 09/21/20 10:35 AM   Specimen: BLOOD RIGHT ARM  Result Value Ref Range Status   Specimen Description BLOOD RIGHT ARM  Final   Special Requests   Final    BOTTLES DRAWN AEROBIC AND ANAEROBIC Blood Culture adequate volume   Culture   Final    NO GROWTH 3 DAYS Performed at [facility], [address] [place], [place] [address]    Report Status PENDING  Incomplete  Resp Panel by RT-PCR (Flu A&B, Covid) Nasopharyngeal Swab     Status: None   Collection Time: 09/21/20 12:47 PM   Specimen: Nasopharyngeal Swab; Nasopharyngeal(NP) swabs in vial transport medium  Result Value Ref Range Status   SARS Coronavirus 2 by RT PCR NEGATIVE NEGATIVE Final    Comment: (NOTE) SARS-CoV-2 target nucleic acids are NOT DETECTED.  The SARS-CoV-2 RNA is generally detectable in upper respiratory specimens during the acute phase of infection. The lowest concentration of SARS-CoV-2 viral copies this assay can detect is 138 copies/mL. A negative result does not preclude SARS-Cov-2 infection and should not be used as the sole basis for treatment or other patient management decisions. A negative result may occur with  improper specimen collection/handling, submission of specimen other than nasopharyngeal swab, presence of viral mutation(s) within the areas targeted by this assay, and inadequate number of viral copies(<138 copies/mL). A negative result must be combined with clinical observations, patient history, and epidemiological information. The expected result is Negative.  Fact Sheet for Patients:  https://www.fda.gov/media/152166/download  Fact Sheet for Healthcare Providers:  https://www.fda.gov/media/152162/download  This test is no t yet approved or cleared by the [place] FDA and    has been authorized for detection and/or diagnosis of SARS-CoV-2 by FDA under an Emergency Use Authorization (EUA). This EUA will remain  in effect (meaning this test can be used) for the duration of the COVID-19 declaration under Section 564(b)(1) of the Act, 21 U.S.C.section 360bbb-3(b)(1), unless the authorization is terminated  or revoked sooner.       Influenza A by PCR NEGATIVE NEGATIVE Final   Influenza B by PCR NEGATIVE NEGATIVE Final    Comment: (NOTE) The Xpert Xpress  SARS-CoV-2/FLU/RSV plus assay is intended as an aid in the diagnosis of influenza from Nasopharyngeal swab specimens and should not be used as a sole basis for treatment. Nasal washings and aspirates are unacceptable for Xpert Xpress SARS-CoV-2/FLU/RSV testing.  Fact Sheet for Patients: BloggerCourse.com  Fact Sheet for Healthcare Providers: SeriousBroker.it  This test is not yet approved or cleared by the [place] FDA and has been authorized for detection and/or diagnosis of SARS-CoV-2 by FDA under an Emergency Use Authorization (EUA). This EUA will remain in effect (meaning this test can be used) for the duration of the COVID-19 declaration under Section 564(b)(1) of the Act, 21 U.S.C. section 360bbb-3(b)(1), unless the authorization is terminated or revoked.  Performed at [facility], [address] [place], [place] [address]   Surgical pcr screen     Status: Abnormal   Collection Time: 09/22/20  5:50 AM   Specimen: Nasal Mucosa; Nasal Swab  Result Value Ref Range Status   MRSA, PCR NEGATIVE NEGATIVE Final   Staphylococcus aureus POSITIVE (A) NEGATIVE Final    Comment: (NOTE) The Xpert SA Assay (FDA approved for NASAL specimens in patients 20 years of age and older), is one component of a comprehensive surveillance program. It is not intended to diagnose infection nor to guide or monitor treatment. Performed at [facility], [address], [place], [place] [address]   Aerobic/Anaerobic Culture w Gram Stain (surgical/deep wound)     Status: None (Preliminary result)   Collection Time: 09/22/20  4:42 PM   Specimen: Abscess  Result Value Ref Range Status   Specimen Description ABSCESS  Final   Special Requests ABSCESS LEFT FOOT SPEC A  Final   Gram Stain   Final    RARE WBC PRESENT,BOTH PMN AND MONONUCLEAR FEW GRAM NEGATIVE COCCOBACILLI RARE GRAM POSITIVE COCCI IN PAIRS Performed at [facility], [address] [place], [place] [address]    Culture   Final    MODERATE PROTEUS PENNERI FEW STREPTOCOCCUS ANGINOSIS SUSCEPTIBILITIES TO FOLLOW FEW STREPTOCOCCUS AGALACTIAE TESTING AGAINST S. AGALACTIAE NOT ROUTINELY PERFORMED DUE TO PREDICTABILITY OF AMP/PEN/VAN SUSCEPTIBILITY. NO ANAEROBES ISOLATED; CULTURE IN PROGRESS FOR 5 DAYS    Report Status PENDING  Incomplete    Assessment & Plan:  Patient was evaluated and treated and all questions answered.  Left foot osteomyelitis, abscess s/p I and D -Dressing changed - was previously changed by RN earlier today. Wound with granulating edges, small amount of continued purulence. -Continue empiric abx. Multiple organisms on culture - sensitivities pending. -Heel WB in surgical shoe -Plan for RTOR for debridement, 3rd ray resection, and closure. Planning for Sunday once wound stable/ improved. -Labs reviewed. WBC normalized.  Dispo: Plan for surgery Sunday, then d/c Monday from pedal standpoint.  Park Liter, DPM  Accessible via secure chat for questions or concerns.

## 2020-09-24 NOTE — Progress Notes (Signed)
Pharmacy Antibiotic Note  Carl [name] is a 55 y.o. male admitted on 09/21/2020 with  diabetic foot infection with concerns for osteomyelitis .  Pharmacy has been consulted for Vancomycin and Cefepime dosing.  Patient is s/p OR 7/13 for I&D with plans for repeat I&D possibly 7/17.  Today is abx day #4.  BCx are ngtd, wound cx with mixed bacteria, cultures pending.  Renal fxn is stable with SCr < 0.8.  Will order Vancomycin peak and trough around next dosing interval to evaluate therapy.  Plan: Continue Vancomycin 1500mg  IV q12h Vancomycin peak ordered for 7/15 PM and trough ordered for 7/16 AM.  Further dose adjustments based on AUC calculations. Continue Cefepime 2gm IV q8h Continues Flagyl 500mg  IV q8h Follow-up cx data and clinical progress.  Height: 5\' 10"  (177.8 cm) Weight: 108.7 kg (239 lb 10.2 oz) IBW/kg (Calculated) : 73  Temp (24hrs), Avg:98.1 F (36.7 C), Min:97.9 F (36.6 C), Max:98.2 F (36.8 C)  Recent Labs  Lab 09/21/20 1030 09/22/20 0741 09/23/20 0427 09/24/20 0414  WBC 14.1* 10.6* 11.6* 7.5  CREATININE 0.64 0.54* 0.53* 0.49*  LATICACIDVEN 1.6  --   --   --     Estimated Creatinine Clearance: 127.3 mL/min (A) (by C-G formula based on SCr of 0.49 mg/dL (L)).    Allergies  Allergen Reactions   Morphine And Related Other (See Comments)    hallucinations    Antimicrobials this admission: Zosyn x1 dose 7/12 Cefepime 7/12 >> Vancomycin 7/12 >> Flagyl 7/13 >>  Dose adjustments this admission:   Microbiology results: 7/12 Bcx X2: NGTD 7/12 Ucx: neg 7/13 L foot abs: few GN coccobacilli, rare GP cocci   Thank you for allowing pharmacy to be a part of this patient's care.  9/12, Pharm.D., BCPS Clinical Pharmacist Clinical phone for 09/24/2020 from 7:30-3:00 is [phone].  **Pharmacist phone directory can be found on [email or web address] listed under [facility].  09/24/2020 12:59 PM

## 2020-09-24 NOTE — Progress Notes (Signed)
Subjective:  Patient ID: Carl [name], male    DOB: 1966-02-14,  MRN: [id number]  Patient seen bedside, denies new complaints today.  Objective:   Vitals:   09/24/20 1213 09/24/20 1817  BP: (!) 141/71 (!) 195/91  Pulse: 67 67  Resp: 16 16  Temp: 98.1 F (36.7 C) 98.4 F (36.9 C)  SpO2: 100% 100%    General AA&O x3. Normal mood and affect.  Vascular Left foot warm and well perfused.  Neurologic Epicritic sensation grossly absent.  Dermatologic (Wound) Surgical wound left foot with mild continued purulence. Receding cellulitis, warmth. No further ganrene noted, wound base fibronecrotic.  Orthopedic: Motor intact BLE.    Results for orders placed or performed during the hospital encounter of 09/21/20  Urine culture     Status: None   Collection Time: 09/21/20 10:13 AM   Specimen: In/Out Cath Urine  Result Value Ref Range Status   Specimen Description IN/OUT CATH URINE  Final   Special Requests NONE  Final   Culture   Final    NO GROWTH Performed at [facility], [address], [place], [place] [address]    Report Status 09/22/2020 FINAL  Final  Blood Culture (routine x 2)     Status: None (Preliminary result)   Collection Time: 09/21/20 10:30 AM   Specimen: BLOOD RIGHT ARM  Result Value Ref Range Status   Specimen Description BLOOD RIGHT ARM  Final   Special Requests   Final    BOTTLES DRAWN AEROBIC AND ANAEROBIC Blood Culture adequate volume   Culture   Final    NO GROWTH 3 DAYS Performed at [facility], [address] [place], [place] [address]    Report Status PENDING  Incomplete  Blood Culture (routine x 2)     Status: None (Preliminary result)   Collection Time: 09/21/20 10:35 AM   Specimen: BLOOD RIGHT ARM  Result Value Ref Range Status   Specimen Description BLOOD RIGHT ARM  Final   Special Requests   Final    BOTTLES DRAWN AEROBIC AND ANAEROBIC Blood Culture adequate volume   Culture   Final    NO GROWTH 3 DAYS Performed at [facility], [address] [place], [place] [address]    Report Status PENDING  Incomplete  Resp Panel by RT-PCR (Flu A&B, Covid) Nasopharyngeal Swab     Status: None   Collection Time: 09/21/20 12:47 PM   Specimen: Nasopharyngeal Swab; Nasopharyngeal(NP) swabs in vial transport medium  Result Value Ref Range Status   SARS Coronavirus 2 by RT PCR NEGATIVE NEGATIVE Final    Comment: (NOTE) SARS-CoV-2 target nucleic acids are NOT DETECTED.  The SARS-CoV-2 RNA is generally detectable in upper respiratory specimens during the acute phase of infection. The lowest concentration of SARS-CoV-2 viral copies this assay can detect is 138 copies/mL. A negative result does not preclude SARS-Cov-2 infection and should not be used as the sole basis for treatment or other patient management decisions. A negative result may occur with  improper specimen collection/handling, submission of specimen other than nasopharyngeal swab, presence of viral mutation(s) within the areas targeted by this assay, and inadequate number of viral copies(<138 copies/mL). A negative result must be combined with clinical observations, patient history, and epidemiological information. The expected result is Negative.  Fact Sheet for Patients:  BloggerCourse.com  Fact Sheet for Healthcare Providers:  SeriousBroker.it  This test is no t yet approved or cleared by the [place] and  has been authorized for detection and/or diagnosis of SARS-CoV-2 by FDA under an Emergency Use Authorization (EUA). This EUA will remain  in effect (meaning this test can be used) for the duration of the COVID-19 declaration under Section 564(b)(1) of the Act, 21 U.S.C.section 360bbb-3(b)(1), unless the authorization is terminated  or revoked sooner.       Influenza A by PCR NEGATIVE NEGATIVE Final   Influenza B by PCR NEGATIVE NEGATIVE Final    Comment: (NOTE) The Xpert Xpress  SARS-CoV-2/FLU/RSV plus assay is intended as an aid in the diagnosis of influenza from Nasopharyngeal swab specimens and should not be used as a sole basis for treatment. Nasal washings and aspirates are unacceptable for Xpert Xpress SARS-CoV-2/FLU/RSV testing.  Fact Sheet for Patients: BloggerCourse.com  Fact Sheet for Healthcare Providers: SeriousBroker.it  This test is not yet approved or cleared by the [place] FDA and has been authorized for detection and/or diagnosis of SARS-CoV-2 by FDA under an Emergency Use Authorization (EUA). This EUA will remain in effect (meaning this test can be used) for the duration of the COVID-19 declaration under Section 564(b)(1) of the Act, 21 U.S.C. section 360bbb-3(b)(1), unless the authorization is terminated or revoked.  Performed at [facility], [address] [place], [place] [address]   Surgical pcr screen     Status: Abnormal   Collection Time: 09/22/20  5:50 AM   Specimen: Nasal Mucosa; Nasal Swab  Result Value Ref Range Status   MRSA, PCR NEGATIVE NEGATIVE Final   Staphylococcus aureus POSITIVE (A) NEGATIVE Final    Comment: (NOTE) The Xpert SA Assay (FDA approved for NASAL specimens in patients 20 years of age and older), is one component of a comprehensive surveillance program. It is not intended to diagnose infection nor to guide or monitor treatment. Performed at [facility], [address], [place], [place] [address]   Aerobic/Anaerobic Culture w Gram Stain (surgical/deep wound)     Status: None (Preliminary result)   Collection Time: 09/22/20  4:42 PM   Specimen: Abscess  Result Value Ref Range Status   Specimen Description ABSCESS  Final   Special Requests ABSCESS LEFT FOOT SPEC A  Final   Gram Stain   Final    RARE WBC PRESENT,BOTH PMN AND MONONUCLEAR FEW GRAM NEGATIVE COCCOBACILLI RARE GRAM POSITIVE COCCI IN PAIRS Performed at [facility]  Lab, [address] [place], [place] [address]    Culture   Final    MODERATE PROTEUS PENNERI FEW STREPTOCOCCUS ANGINOSIS SUSCEPTIBILITIES TO FOLLOW FEW STREPTOCOCCUS AGALACTIAE TESTING AGAINST S. AGALACTIAE NOT ROUTINELY PERFORMED DUE TO PREDICTABILITY OF AMP/PEN/VAN SUSCEPTIBILITY. NO ANAEROBES ISOLATED; CULTURE IN PROGRESS FOR 5 DAYS    Report Status PENDING  Incomplete    Assessment & Plan:  Patient was evaluated and treated and all questions answered.  Left foot osteomyelitis, abscess s/p I and D -Dressing changed - was previously changed by RN earlier today. Wound with granulating edges, small amount of continued purulence. -Continue empiric abx. Multiple organisms on culture - sensitivities pending. -Heel WB in surgical shoe -Plan for RTOR for debridement, 3rd ray resection, and closure. Planning for Sunday once wound stable/ improved. -Labs reviewed. WBC normalized.  Dispo: Plan for surgery Sunday, then d/c Monday from pedal standpoint.  Park Liter, DPM  Accessible via secure chat for questions or concerns.

## 2020-09-24 NOTE — Progress Notes (Signed)
Family Medicine Teaching Service Daily Progress Note Intern Pager: [phone]  Patient name: Carl [name] Medical record number: [id number] Date of birth: 1966-03-01 Age: 55 y.o. Gender: male  Primary Care Provider: Alicia Amel, MD Consultants: Podiatry Code Status: Full  Pt Overview and Major Events to Date:  7/12-admitted 7/12-left middle toe I&D, amputation  Assessment and Plan: Carl [name] is a 55 year old male who is being treated for left middle metatarsal necrosis with underlying cellulitis.  PMH is significant for uncontrolled T2DM, HTN and HLD  L foot osteomyelitis with cellulitis Pt is post op day two and expressed his pain is well managed. He did report increased pain since increasing the oxycodone duration to q6h. He denies any nausea, vomiting or SOB. His last BM was 2 days ago. Podiatry is following and planning for a second procedure of debridement on Sunday 7/17. Swelling on the left foot have significantly improved from yesterday.  We will start patient on aspirin for concerns of possible PAD. -Podiatry following, appreciate recs -f/u ABI -Continue Cefepime 2g -Continue Vancomycin -continue Flagyl 500mg  -Tylenol 650 mg prn - started Aspirin 81mg   Type 2 Diabetes -continue sliding scale insulin -continue daily CBG  Hyperlipidemia Patient has a history of uncontrolled hyperlipidemia and does not take any home medication. -Started atorvastatin 20 mg daily  HTN Patient has uncontrolled HTN and was not on any home medication.  Most recent blood pressure is 141/71. - started on Losartan 25 mg daily  FEN/GI: Regular diet PPx: Heparin Dispo:Home pending clinical improvement . Barriers include podiatry procedure.   Subjective:  Carl [name] is laying comfortably supine.  He said he is feeling well today and has been able to walk around with moderate pain on his left foot.  Objective: Temp:  [97.9 F (36.6 C)-98.3 F (36.8 C)] 97.9 F (36.6 C) (07/15 0454) Pulse  Rate:  [63-72] 66 (07/15 0624) Resp:  [18] 18 (07/15 0454) BP: (146-164)/(77-83) 156/78 (07/15 0624) SpO2:  [92 %-97 %] 97 % (07/15 0454) Physical Exam: General: Laying comfortably supine, alert, NAD Cardiovascular: RRR, no murmurs,  Respiratory: Clear breath sounds bilaterally, no wheezing Abdomen: Positive bowel sounds, no distention or tenderness Extremities: Mild swelling on the left foot, wound dressing is dry and clean.  Laboratory: Recent Labs  Lab 09/22/20 0741 09/23/20 0427 09/24/20 0414  WBC 10.6* 11.6* 7.5  HGB 10.8* 11.2* 10.5*  HCT 32.9* 34.7* 31.9*  PLT 263 280 283   Recent Labs  Lab 09/21/20 1030 09/22/20 0741 09/23/20 0427 09/24/20 0414  NA 131* 134* 133* 135  K 3.8 4.3 4.1 3.7  CL 96* 101 100 100  CO2 24 26 25 27   BUN 11 8 10 7   CREATININE 0.64 0.54* 0.53* 0.49*  CALCIUM 8.6* 8.3* 8.4* 8.4*  PROT 7.0  --   --   --   BILITOT 0.9  --   --   --   ALKPHOS 94  --   --   --   ALT 25  --   --   --   AST 19  --   --   --   GLUCOSE 322* 275* 252* 224*      Imaging/Diagnostic Tests: No image studies  09/25/20, MD 09/24/2020, 7:16 AM PGY-1, [facility] FPTS Intern pager: [phone], text pages welcome

## 2020-09-24 NOTE — Progress Notes (Signed)
Inpatient Diabetes Program Recommendations  AACE/ADA: New Consensus Statement on Inpatient Glycemic Control (2015)  Target Ranges:  Prepandial:   less than 140 mg/dL      Peak postprandial:   less than 180 mg/dL (1-2 hours)      Critically ill patients:  140 - 180 mg/dL   Lab Results  Component Value Date   GLUCAP 216 (H) 09/24/2020   HGBA1C 10.7 (H) 09/21/2020    Review of Glycemic Control Results for Carl [name], Carl [name] (MRN [id number]) as of 09/24/2020 09:23  Ref. Range 09/23/2020 06:01 09/23/2020 10:48 09/23/2020 16:13 09/23/2020 21:16 09/24/2020 05:56  Glucose-Capillary Latest Ref Range: 70 - 99 mg/dL 005 (H) 110 (H) 211 (H) 243 (H) 216 (H)     Diabetes history: DM 2 Outpatient Diabetes medications: None been out of meds for 6 months Current orders for Inpatient glycemic control:  Novolog 0-15 units tid  A1c 10.7% this admission  Inpatient Diabetes Program Recommendations:    -  Increase lantus 18 units    Thanks,  Christena Deem RN, MSN, BC-ADM Inpatient Diabetes Coordinator Team Pager [phone] (8a-5p)

## 2020-09-24 NOTE — Progress Notes (Signed)
Dressing change completed.

## 2020-09-24 NOTE — Anesthesia Preprocedure Evaluation (Addendum)
Anesthesia Evaluation  Patient identified by MRN, date of birth, ID band Patient awake    Reviewed: Allergy & Precautions, NPO status , Patient's Chart, lab work & pertinent test results  History of Anesthesia Complications Negative for: history of anesthetic complications  Airway Mallampati: II  TM Distance: >3 FB Neck ROM: Full    Dental  (+) Dental Advisory Given, Teeth Intact   Pulmonary former smoker,  09/21/2020 SARS coronavirus NEG   breath sounds clear to auscultation       Cardiovascular hypertension, Pt. on medications (-) angina Rhythm:Regular Rate:Normal     Neuro/Psych H/o Bell's palsy R    GI/Hepatic negative GI ROS, Neg liver ROS,   Endo/Other  diabetes, Oral Hypoglycemic AgentsMorbid obesity  Renal/GU negative Renal ROS     Musculoskeletal   Abdominal (+) + obese,   Peds  Hematology negative hematology ROS (+)   Anesthesia Other Findings   Reproductive/Obstetrics                            Anesthesia Physical Anesthesia Plan  ASA: 3  Anesthesia Plan: MAC   Post-op Pain Management:    Induction:   PONV Risk Score and Plan: 1 and Ondansetron and Treatment may vary due to age or medical condition  Airway Management Planned: Natural Airway and Simple Face Mask  Additional Equipment: None  Intra-op Plan:   Post-operative Plan:   Informed Consent: I have reviewed the patients History and Physical, chart, labs and discussed the procedure including the risks, benefits and alternatives for the proposed anesthesia with the patient or authorized representative who has indicated his/her understanding and acceptance.       Plan Discussed with: CRNA and Surgeon  Anesthesia Plan Comments:        Anesthesia Quick Evaluation

## 2020-09-24 NOTE — Plan of Care (Signed)

## 2020-09-25 ENCOUNTER — Inpatient Hospital Stay (HOSPITAL_COMMUNITY): Payer: Self-pay

## 2020-09-25 DIAGNOSIS — M869 Osteomyelitis, unspecified: Secondary | ICD-10-CM

## 2020-09-25 DIAGNOSIS — L02612 Cutaneous abscess of left foot: Secondary | ICD-10-CM

## 2020-09-25 LAB — BASIC METABOLIC PANEL WITH GFR
Anion gap: 5 (ref 5–15)
BUN: 7 mg/dL (ref 6–20)
CO2: 29 mmol/L (ref 22–32)
Calcium: 8.2 mg/dL — ABNORMAL LOW (ref 8.9–10.3)
Chloride: 100 mmol/L (ref 98–111)
Creatinine, Ser: 0.53 mg/dL — ABNORMAL LOW (ref 0.61–1.24)
GFR, Estimated: >60 mL/min
Glucose, Bld: 252 mg/dL — ABNORMAL HIGH (ref 70–99)
Potassium: 4 mmol/L (ref 3.5–5.1)
Sodium: 134 mmol/L — ABNORMAL LOW (ref 135–145)

## 2020-09-25 LAB — CBC
HCT: 34 % — ABNORMAL LOW (ref 39.0–52.0)
Hemoglobin: 11.1 g/dL — ABNORMAL LOW (ref 13.0–17.0)
MCH: 27.5 pg (ref 26.0–34.0)
MCHC: 32.6 g/dL (ref 30.0–36.0)
MCV: 84.4 fL (ref 80.0–100.0)
Platelets: 304 10*3/uL (ref 150–400)
RBC: 4.03 MIL/uL — ABNORMAL LOW (ref 4.22–5.81)
RDW: 13.6 % (ref 11.5–15.5)
WBC: 6.5 10*3/uL (ref 4.0–10.5)
nRBC: 0 % (ref 0.0–0.2)

## 2020-09-25 LAB — GLUCOSE, CAPILLARY
Glucose-Capillary: 232 mg/dL — ABNORMAL HIGH (ref 70–99)
Glucose-Capillary: 270 mg/dL — ABNORMAL HIGH (ref 70–99)
Glucose-Capillary: 215 mg/dL — ABNORMAL HIGH (ref 70–99)
Glucose-Capillary: 238 mg/dL — ABNORMAL HIGH (ref 70–99)

## 2020-09-25 MED ORDER — CEFAZOLIN SODIUM-DEXTROSE 2-4 GM/100ML-% IV SOLN
2.0000 g | INTRAVENOUS | Status: AC
  Administered 2020-09-26: 2 g via INTRAVENOUS
  Filled 2020-09-25: qty 100

## 2020-09-25 NOTE — Progress Notes (Signed)
VASCULAR LAB    ABIs have been performed.  See CV proc for preliminary results.   Tristen Pennino, RVT 09/25/2020, 9:41 AM

## 2020-09-25 NOTE — Progress Notes (Signed)
Family Medicine Teaching Service Daily Progress Note Intern Pager: [phone]  Patient name: Carl [name] Medical record number: [id number] Date of birth: May 10, 1965 Age: 55 y.o. Gender: male  Primary Care Provider: Alicia Amel, MD Consultants: Podiatry Code Status: Full  Pt Overview and Major Events to Date:  7/12-admitted 7/18-left third toe I&D, amputation  Assessment and Plan: Carl [name] is a 55 yo male with a history of uncontrolled diabetes, HTN, HLD who presented with necrosis of the left third toe found to have osteomyelitis of the toe and of the third metatarsal also found to have overlying cellulitis.  Now postop day 3 from a left third toe amputation and I&D.   L foot osteomyelitis with cellulitis Concern for PAD POD #4.  Pain well controlled.  Right ABI was noncompressible with normal TBI; left ABI WNL.  Blood cultures remain with NGTD.  Surgical culture with  Proteus penneri, Strep anginosis, and Strep agalactiae; awaiting sensitivities. Going for wound debridement today. -Continue Cefepime/Vancomycin/Flagyl -PRN Tylenol and Oxy for pain -NPO at MN for surgery tomorrow -Continue aspirin 81mg  and atorvastatin -Follow-up sensitivities for wound culture   Type 2 Diabetes CBGs in the last 24 hours 202-270. Received total of 13U Novolog in the last 24h. - Will hold Lantus 15U while NPO - Continue CBGs, SSI   HTN BP range in last 24 hours 161-178/76-99 -Continue Losartan 25 mg daily -Adjust medications outpatient   FEN/GI: Carb modified PPx: Heparin Dispo:Home pending clinical improvement . Barriers include surgery  Subjective:  Patient reports that he is feeling pretty good this morning.  He states he has not had much pain from his foot but did have to take something for it last night.  Has been trying to decrease the amount of pain medications he takes because he is afraid of "getting hooked". Patient scheduled for procedure at 7:30am.   Objective: Temp:  [97.6 F  (36.4 C)-98.2 F (36.8 C)] 98.2 F (36.8 C) (07/17 0456) Pulse Rate:  [61-63] 62 (07/17 0456) Resp:  [18] 18 (07/17 0456) BP: (161-178)/(76-99) 161/76 (07/17 0456) SpO2:  [97 %-100 %] 97 % (07/17 0456) Physical Exam: General: Well-appearing, NAD, supine in bed Cardiovascular: RRR, no M/G Respiratory: CTAB, no increased work of breathing, breathing abdomen room air Abdomen: Soft, nontender, nondistended Extremities: Left foot covered with wrap and Ace bandage, no erythema or streaking proximal, no edema present bilaterally  Laboratory: Recent Labs  Lab 09/24/20 0414 09/25/20 0558 09/26/20 0348  WBC 7.5 6.5 7.8  HGB 10.5* 11.1* 11.9*  HCT 31.9* 34.0* 36.3*  PLT 283 304 328   Recent Labs  Lab 09/21/20 1030 09/22/20 0741 09/24/20 0414 09/25/20 0558 09/26/20 0348  NA 131*   < > 135 134* 135  K 3.8   < > 3.7 4.0 3.9  CL 96*   < > 100 100 100  CO2 24   < > 27 29 28   BUN 11   < > 7 7 6   CREATININE 0.64   < > 0.49* 0.53* 0.57*  CALCIUM 8.6*   < > 8.4* 8.2* 8.7*  PROT 7.0  --   --   --   --   BILITOT 0.9  --   --   --   --   ALKPHOS 94  --   --   --   --   ALT 25  --   --   --   --   AST 19  --   --   --   --  GLUCOSE 322*   < > 224* 252* 202*   < > = values in this interval not displayed.     Imaging/Diagnostic Tests: VAS [place] ABI WITH/WO TBI  Result Date: 09/25/2020  LOWER EXTREMITY DOPPLER STUDY Patient Name:  Carl [name]  Date of Exam:   09/25/2020 Medical Rec #: [id number]    Accession #:    [id number] Date of Birth: Jun 22, 1965    Patient Gender: M Patient Age:   55Y Exam Location:  [facility] Procedure:      VAS [place] ABI WITH/WO TBI Referring Phys: [id number] MICHAEL J PRICE --------------------------------------------------------------------------------  Indications: Ulceration, and gangrene. High Risk Factors: Hypertension, hyperlipidemia, Diabetes. Other Factors: Status post left third toe amputation. Non compliant with                medication for over a year.   Comparison Study: No prior study on file Performing Technologist: Sherren Kerns RVS  Examination Guidelines: A complete evaluation includes at minimum, Doppler waveform signals and systolic blood pressure reading at the level of bilateral brachial, anterior tibial, and posterior tibial arteries, when vessel segments are accessible. Bilateral testing is considered an integral part of a complete examination. Photoelectric Plethysmograph (PPG) waveforms and toe systolic pressure readings are included as required and additional duplex testing as needed. Limited examinations for reoccurring indications may be performed as noted.  ABI Findings: +---------+------------------+-----+--------+----------------+ Right    Rt Pressure (mmHg)IndexWaveformComment          +---------+------------------+-----+--------+----------------+ Brachial 186                    biphasic                 +---------+------------------+-----+--------+----------------+ PTA      254               1.37 biphasicnon compressible +---------+------------------+-----+--------+----------------+ DP       249               1.34 biphasicnon compressible +---------+------------------+-----+--------+----------------+ Great Toe174               0.94                          +---------+------------------+-----+--------+----------------+ +---------+------------------+-----+--------+-------+ Left     Lt Pressure (mmHg)IndexWaveformComment +---------+------------------+-----+--------+-------+ Brachial 177                    biphasic        +---------+------------------+-----+--------+-------+ PTA      224               1.20 biphasic        +---------+------------------+-----+--------+-------+ DP       223               1.20 biphasic        +---------+------------------+-----+--------+-------+ Great Toe177               0.95                 +---------+------------------+-----+--------+-------+  +-------+-----------+-----------+------------+------------+ ABI/TBIToday's ABIToday's TBIPrevious ABIPrevious TBI +-------+-----------+-----------+------------+------------+ Right  1.37       0.94                                +-------+-----------+-----------+------------+------------+ Left   1.2        0.95                                +-------+-----------+-----------+------------+------------+  Summary: Right: Resting right ankle-brachial index indicates noncompressible right lower extremity arteries. The right toe-brachial index is normal. ABIs are unreliable. TBIs are unreliable. Left: Resting left ankle-brachial index is within normal range. No evidence of significant left lower extremity arterial disease. The left toe-brachial index is normal. ABIs are unreliable. TBIs are unreliable.  *See table(s) above for measurements and observations.  Electronically signed by Sherald Hess MD on 09/25/2020 at 2:04:56 PM.    Final      Evelena Leyden, DO 09/26/2020, 6:31 AM PGY-2, [facility] FPTS Intern pager: [phone], text pages welcome

## 2020-09-25 NOTE — Progress Notes (Signed)
Family Medicine Teaching Service Daily Progress Note Intern Pager: [phone]  Patient name: Carl [name] Medical record number: [id number] Date of birth: 16-Dec-1965 Age: 55 y.o. Gender: male  Primary Care Provider: Alicia Amel, MD Consultants: Podiatry Code Status: Full  Pt Overview and Major Events to Date:  7/12-admitted 7/13-L third toe I&D, amputation  Assessment and Plan: Carl [name] is a 55 yo male with a history of uncontrolled diabetes, HTN, HLD who presented with necrosis of the left third toe found to have osteomyelitis of the toe and of the third metatarsal also found to have overlying cellulitis.  Now postop day 3 from a left third toe amputation and I&D.  L foot osteomyelitis with cellulitis Concern for PAD Patient is now postop day 3.  Pain is well controlled.  Podiatry with plans for second surgery on Sunday 7/17.  Deep wound cultures positive for Proteus penneri, Strep anginosis, and Strep agalactiae. Blood cultures with no growth to date. ABIs pending. -Continue Cefepime/Vancomycin/Flagyl -PRN Tylenol and Oxy for pain -NPO at MN for surgery tomorrow -Continue aspirin 81mg  and atorvastatin  Type 2 Diabetes Recent glucoses 259-286.  - Increasing Lantus to 15u daily - Continue CBGs, SSI  HTN Not previously on home meds. Started Losartan 25mg  daily yesterday. Blood pressures have remained elevated to 150s-60s/80s.  -Continue Losartan -Plan to adjust meds on outpatient basis   FEN/GI: Carb modified PPx: Heparin Dispo:Home pending clinical improvement . Barriers include surgery tomorrow.   Subjective:  Carl [name] reports feeling quite well this morning.  He reports that he is "almost restless I am so well rested."  He states that his pain is well controlled on his current regimen of oxycodone 5 mg every 6 hours as needed.  He feels ready for surgery again tomorrow.  His only question today is about his blood pressure control, explained to him that because we just  started the losartan yesterday we would like to have him on this low-dose for a little while before increasing his dose to get better control and that this will be best managed on an outpatient basis.  Objective: Temp:  [97.6 F (36.4 C)-98.7 F (37.1 C)] 97.6 F (36.4 C) (07/16 0449) Pulse Rate:  [60-67] 60 (07/16 0449) Resp:  [16-20] 18 (07/16 0449) BP: (141-195)/(71-91) 154/80 (07/16 0449) SpO2:  [97 %-100 %] 97 % (07/16 0449) Physical Exam: General: Well-appearing, appears stated age, comfortable Cardiovascular: RRR, no murmurs, rubs, or gallops Respiratory: Lungs clear to auscultation bilaterally, normal work of breathing on room air Abdomen: Soft, nontender, nondistended Extremities: Left lower extremity with bandage in place, no erythema or streaking proximal to bandaged area  Laboratory: Recent Labs  Lab 09/22/20 0741 09/23/20 0427 09/24/20 0414  WBC 10.6* 11.6* 7.5  HGB 10.8* 11.2* 10.5*  HCT 32.9* 34.7* 31.9*  PLT 263 280 283   Recent Labs  Lab 09/21/20 1030 09/22/20 0741 09/23/20 0427 09/24/20 0414  NA 131* 134* 133* 135  K 3.8 4.3 4.1 3.7  CL 96* 101 100 100  CO2 24 26 25 27   BUN 11 8 10 7   CREATININE 0.64 0.54* 0.53* 0.49*  CALCIUM 8.6* 8.3* 8.4* 8.4*  PROT 7.0  --   --   --   BILITOT 0.9  --   --   --   ALKPHOS 94  --   --   --   ALT 25  --   --   --   AST 19  --   --   --  GLUCOSE 322* 275* 252* 224*     Imaging/Diagnostic Tests: No new imaging or tests  Alicia Amel, MD 09/25/2020, 6:27 AM PGY-1, [facility] FPTS Intern pager: [phone], text pages welcome

## 2020-09-25 NOTE — Progress Notes (Signed)
Pharmacy Antibiotic Note  Carl [name] is a 55 y.o. male admitted on 09/21/2020 with  diabetic foot infection with concerns for osteomyelitis .  Pharmacy has been consulted for Vancomycin and Cefepime dosing.  Patient is s/p OR 7/13 for I&D with plans for repeat I&D possibly 7/17.  Today is abx day #5.    Wound culture positive for strep and proteus, which should be sensitive to Cefepime-> susceptibilities pending  Plan: Continue Vancomycin 1500mg  IV q12h - Consider stopping Continue Cefepime 2gm IV q8h Continues Flagyl 500mg  IV q8h Follow-up cx data and clinical progress.  Height: 5\' 10"  (177.8 cm) Weight: 108.7 kg (239 lb 10.2 oz) IBW/kg (Calculated) : 73  Temp (24hrs), Avg:98.1 F (36.7 C), Min:97.6 F (36.4 C), Max:98.7 F (37.1 C)  Recent Labs  Lab 09/21/20 1030 09/22/20 0741 09/23/20 0427 09/24/20 0414 09/25/20 0558  WBC 14.1* 10.6* 11.6* 7.5 6.5  CREATININE 0.64 0.54* 0.53* 0.49* 0.53*  LATICACIDVEN 1.6  --   --   --   --      Estimated Creatinine Clearance: 127.3 mL/min (A) (by C-G formula based on SCr of 0.53 mg/dL (L)).    Allergies  Allergen Reactions   Morphine And Related Other (See Comments)    hallucinations    Antimicrobials this admission: Zosyn x1 dose 7/12 Cefepime 7/12 >> Vancomycin 7/12 >> Flagyl 7/13 >>  Dose adjustments this admission:   Microbiology results: 7/12 Bcx X2: NGTD 7/12 Ucx: neg 7/13 L foot abs: few GN coccobacilli, rare GP cocci   Thank you for allowing pharmacy to be a part of this patient's care. 9/12, PharmD **Pharmacist phone directory can be found on [email or web address] listed under [facility].  09/25/2020 2:48 PM

## 2020-09-25 NOTE — Progress Notes (Addendum)
FPTS Brief Progress Note  S: Patient is resting comfortably when I look into the room.  Spoke with the nurse who had no concerns.  Patient is n.p.o. at midnight for procedure tomorrow.  O: BP (!) 178/99 (BP Location: Left Arm)   Pulse 62   Temp 97.6 F (36.4 C) (Oral)   Resp 18   Ht 5\' 10"  (1.778 m)   Wt 108.7 kg   SpO2 100%   BMI 34.38 kg/m   General: Resting comfortably when I glanced into the room.  Did not wake the patient  A/P: 55 year old male presenting with osteomyelitis of the third toe and third metatarsal with overlying cellulitis. - Continue cefepime and vancomycin until susceptibilities return - Plan for procedure 7/17 - N.p.o. at midnight -Patient has issues with hypertension, systolic blood pressures in the 150s-170s.  We will continue to monitor and continue home amlodipine.  May change outpatient. - Orders reviewed. Labs for AM ordered, which was adjusted as needed.    8/17, MD 09/25/2020, 8:51 PM PGY-3, [facility] Night Resident  Please page [phone] with questions.

## 2020-09-26 ENCOUNTER — Inpatient Hospital Stay (HOSPITAL_COMMUNITY): Payer: Self-pay

## 2020-09-26 ENCOUNTER — Inpatient Hospital Stay (HOSPITAL_COMMUNITY): Payer: Self-pay | Admitting: Anesthesiology

## 2020-09-26 ENCOUNTER — Encounter (HOSPITAL_COMMUNITY)
Admission: EM | Disposition: A | Discharge status: Home / Self Care | Payer: Self-pay | Source: Home / Self Care | Attending: Family Medicine

## 2020-09-26 ENCOUNTER — Encounter (HOSPITAL_COMMUNITY): Payer: Self-pay | Admitting: Family Medicine

## 2020-09-26 DIAGNOSIS — Z481 Encounter for planned postprocedural wound closure: Secondary | ICD-10-CM

## 2020-09-26 HISTORY — PX: WOUND DEBRIDEMENT: SHX247

## 2020-09-26 LAB — BASIC METABOLIC PANEL
Anion gap: 7 (ref 5–15)
BUN: 6 mg/dL (ref 6–20)
CO2: 28 mmol/L (ref 22–32)
Calcium: 8.7 mg/dL — ABNORMAL LOW (ref 8.9–10.3)
Chloride: 100 mmol/L (ref 98–111)
Creatinine, Ser: 0.57 mg/dL — ABNORMAL LOW (ref 0.61–1.24)
GFR, Estimated: >60 mL/min (ref >60)
Glucose, Bld: 202 mg/dL — ABNORMAL HIGH (ref 70–99)
Potassium: 3.9 mmol/L (ref 3.5–5.1)
Sodium: 135 mmol/L (ref 135–145)

## 2020-09-26 LAB — CULTURE, BLOOD (ROUTINE X 2)
Culture: NO GROWTH
Culture: NO GROWTH
Special Requests: ADEQUATE
Special Requests: ADEQUATE

## 2020-09-26 LAB — CBC
HCT: 36.3 % — ABNORMAL LOW (ref 39.0–52.0)
Hemoglobin: 11.9 g/dL — ABNORMAL LOW (ref 13.0–17.0)
MCH: 27.8 pg (ref 26.0–34.0)
MCHC: 32.8 g/dL (ref 30.0–36.0)
MCV: 84.8 fL (ref 80.0–100.0)
Platelets: 328 10*3/uL (ref 150–400)
RBC: 4.28 MIL/uL (ref 4.22–5.81)
RDW: 13.4 % (ref 11.5–15.5)
WBC: 7.8 10*3/uL (ref 4.0–10.5)
nRBC: 0 % (ref 0.0–0.2)

## 2020-09-26 LAB — GLUCOSE, CAPILLARY
Glucose-Capillary: 211 mg/dL — ABNORMAL HIGH (ref 70–99)
Glucose-Capillary: 202 mg/dL — ABNORMAL HIGH (ref 70–99)
Glucose-Capillary: 250 mg/dL — ABNORMAL HIGH (ref 70–99)
Glucose-Capillary: 253 mg/dL — ABNORMAL HIGH (ref 70–99)

## 2020-09-26 IMAGING — DX DG FOOT 2V*L*
2 series · 2 of 2 positions shown · non-contrast
Comparison: [DATE]

CLINICAL DATA: S/p partial 3rd metatarsal resection for
osteomyelitis.

EXAM:
LEFT FOOT - 2 VIEW

[foot ap]
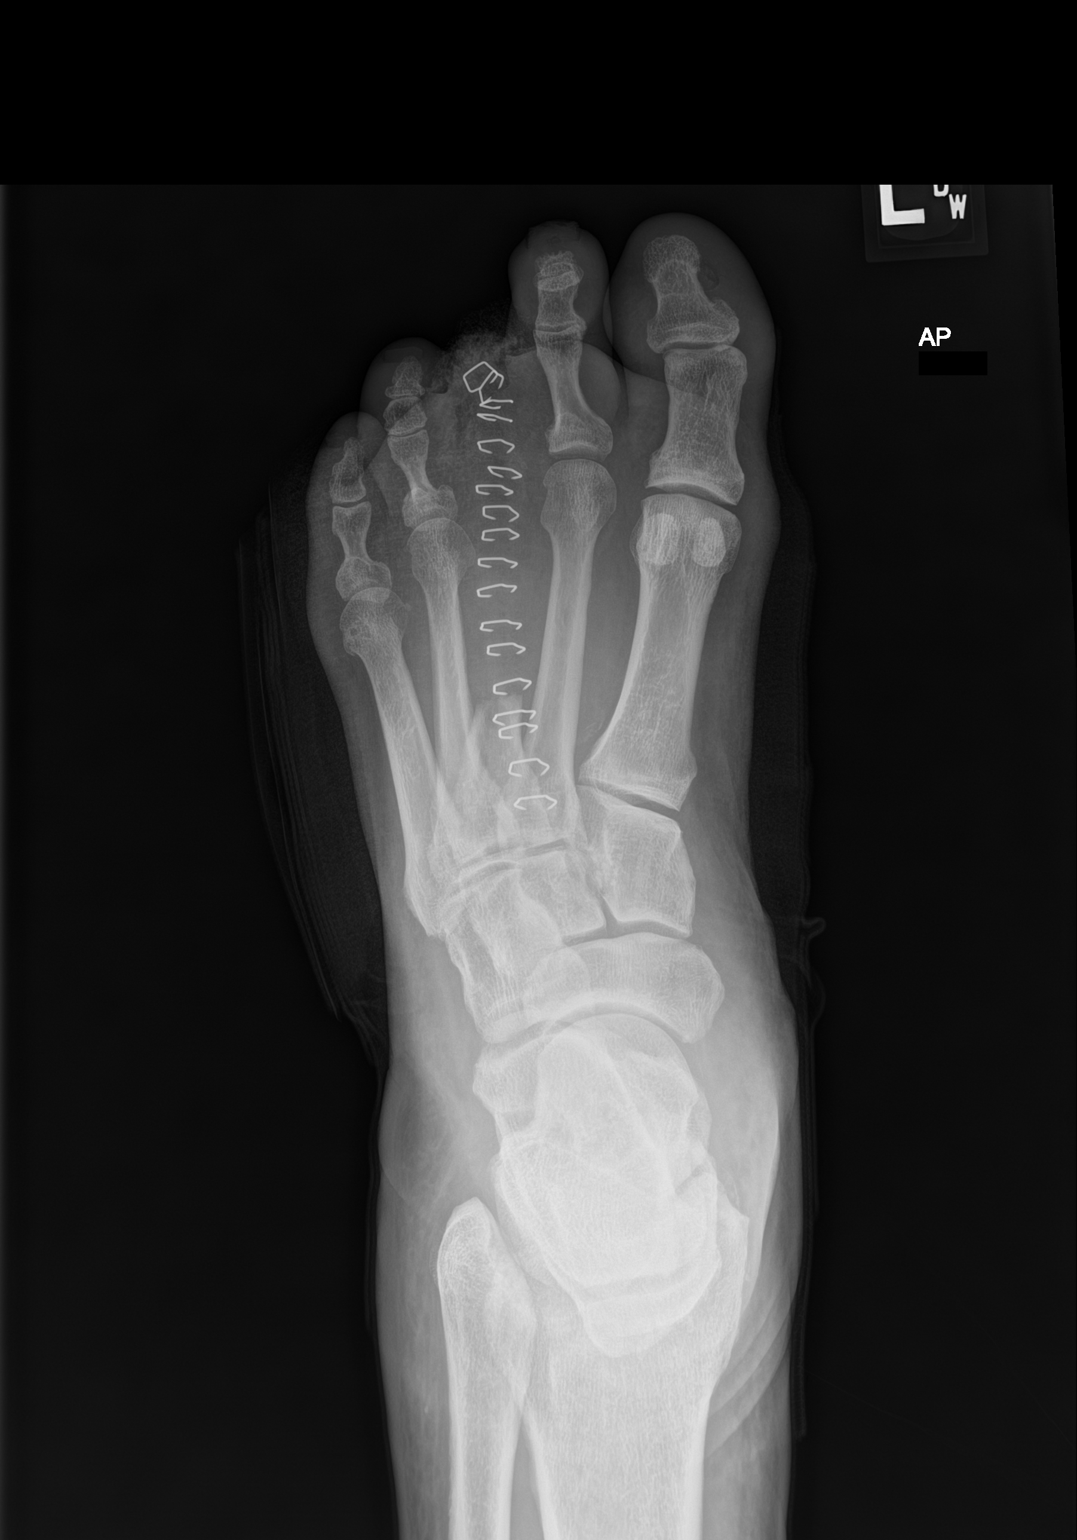

[foot lat]
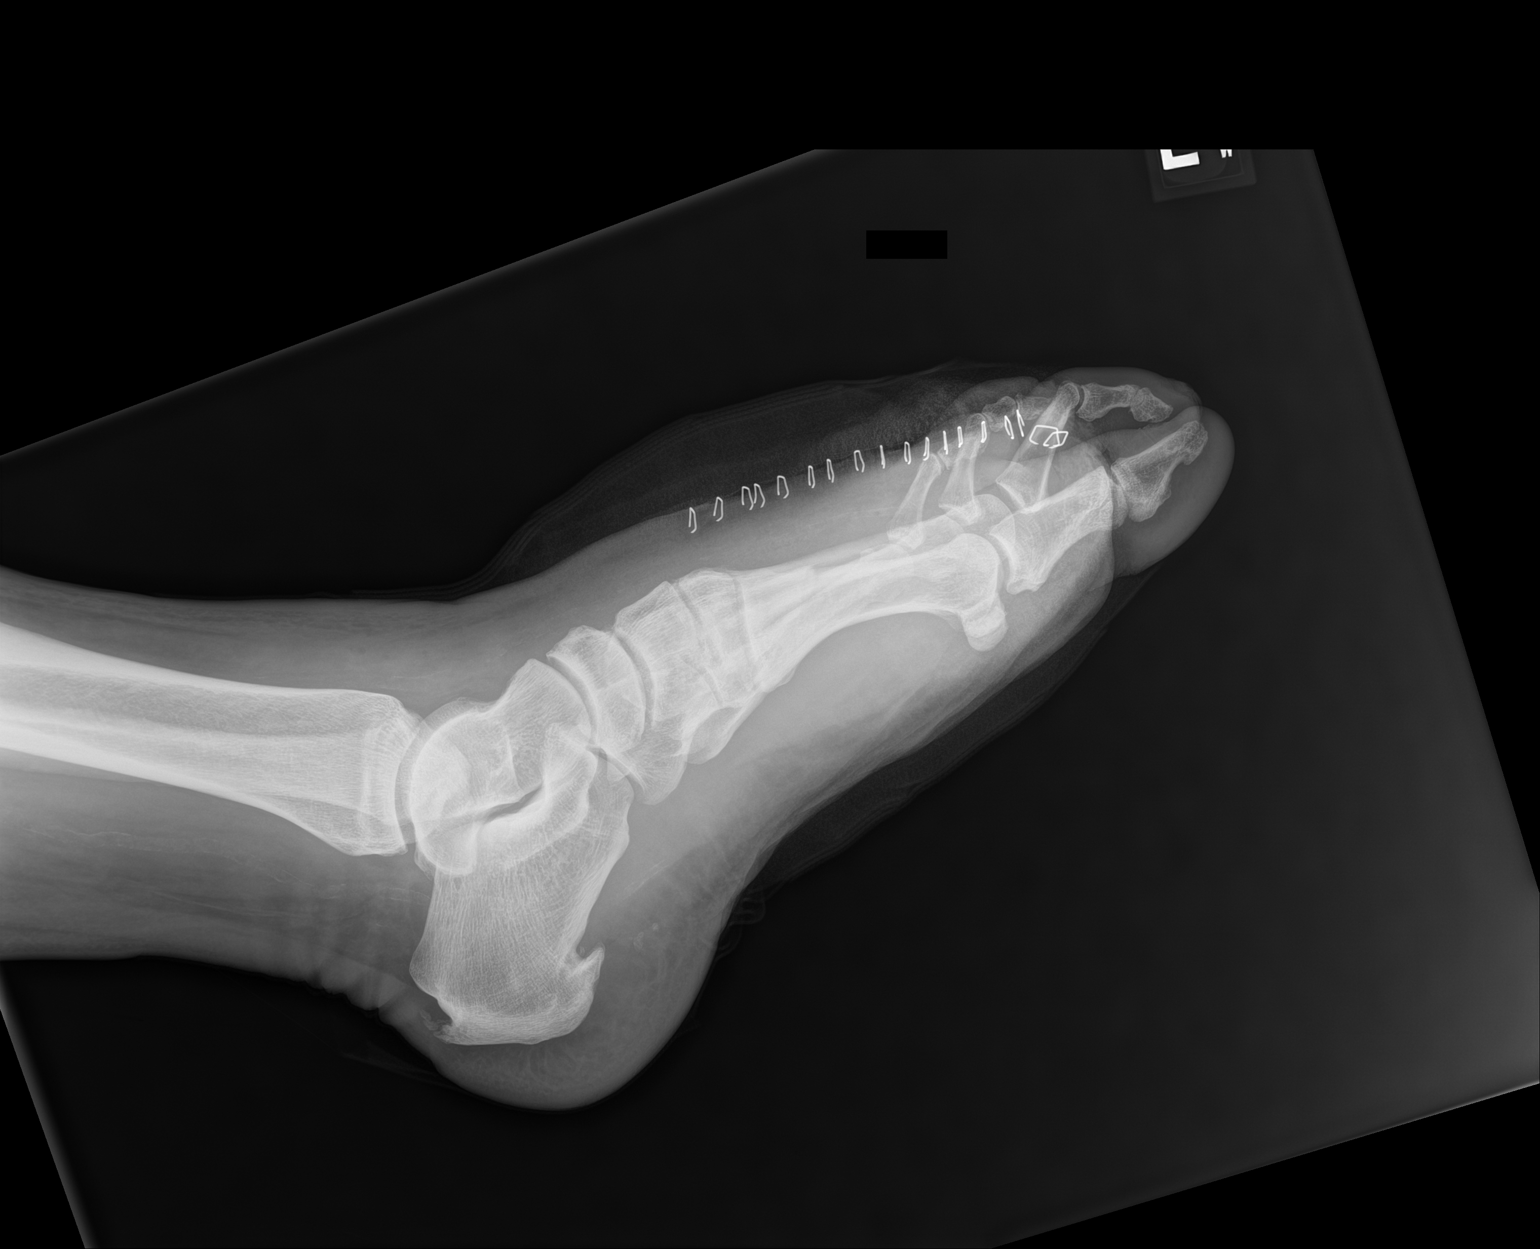

[2 of 2 positions shown; findings below may reference images not displayed]

FINDINGS: Partial 3rd metatarsal resection noted with soft tissue
postoperative changes.

No acute bony abnormalities are present.

No evidence of subluxation or dislocation identified.

A moderate plantar calcaneal spur is again noted.
IMPRESSION: Partial 3rd metatarsal resection changes.

## 2020-09-26 SURGERY — DEBRIDEMENT, WOUND
Anesthesia: Monitor Anesthesia Care | Laterality: Left

## 2020-09-26 MED ORDER — LIDOCAINE 2% (20 MG/ML) 5 ML SYRINGE
INTRAMUSCULAR | Status: DC | PRN
  Administered 2020-09-26: 60 mg via INTRAVENOUS

## 2020-09-26 MED ORDER — ACETAMINOPHEN 500 MG PO TABS
ORAL_TABLET | ORAL | Status: AC
  Filled 2020-09-26: qty 2

## 2020-09-26 MED ORDER — OXYCODONE HCL 5 MG PO TABS
ORAL_TABLET | ORAL | Status: AC
  Filled 2020-09-26: qty 1

## 2020-09-26 MED ORDER — VANCOMYCIN HCL 1000 MG IV SOLR
INTRAVENOUS | Status: AC
  Filled 2020-09-26: qty 1000

## 2020-09-26 MED ORDER — BUPIVACAINE HCL (PF) 0.5 % IJ SOLN
INTRAMUSCULAR | Status: AC
  Filled 2020-09-26: qty 30

## 2020-09-26 MED ORDER — ONDANSETRON HCL 4 MG/2ML IJ SOLN
INTRAMUSCULAR | Status: AC
  Filled 2020-09-26: qty 2

## 2020-09-26 MED ORDER — PROPOFOL 500 MG/50ML IV EMUL
INTRAVENOUS | Status: DC | PRN
  Administered 2020-09-26: 100 ug/kg/min via INTRAVENOUS

## 2020-09-26 MED ORDER — MIDAZOLAM HCL 2 MG/2ML IJ SOLN
INTRAMUSCULAR | Status: AC
  Filled 2020-09-26: qty 2

## 2020-09-26 MED ORDER — PROPOFOL 10 MG/ML IV BOLUS
INTRAVENOUS | Status: AC
  Filled 2020-09-26: qty 20

## 2020-09-26 MED ORDER — PROMETHAZINE HCL 25 MG/ML IJ SOLN: 6.2500 mg | INTRAMUSCULAR | Status: DC | PRN

## 2020-09-26 MED ORDER — PROPOFOL 1000 MG/100ML IV EMUL
INTRAVENOUS | Status: AC
  Filled 2020-09-26: qty 100

## 2020-09-26 MED ORDER — PROPOFOL 10 MG/ML IV BOLUS
INTRAVENOUS | Status: DC | PRN
  Administered 2020-09-26: 50 mg via INTRAVENOUS

## 2020-09-26 MED ORDER — ACETAMINOPHEN 500 MG PO TABS
1000.0000 mg | ORAL_TABLET | Freq: Once | ORAL | Status: AC
  Administered 2020-09-26: 1000 mg via ORAL

## 2020-09-26 MED ORDER — FENTANYL CITRATE (PF) 100 MCG/2ML IJ SOLN
INTRAMUSCULAR | Status: DC | PRN
  Administered 2020-09-26: 25 ug via INTRAVENOUS
  Administered 2020-09-26: 50 ug via INTRAVENOUS

## 2020-09-26 MED ORDER — OXYCODONE HCL 5 MG PO TABS
5.0000 mg | ORAL_TABLET | ORAL | Status: DC | PRN
  Administered 2020-09-26 – 2020-09-28 (×7): 5 mg via ORAL
  Filled 2020-09-26 (×7): qty 1

## 2020-09-26 MED ORDER — MIDAZOLAM HCL 5 MG/5ML IJ SOLN
INTRAMUSCULAR | Status: DC | PRN
  Administered 2020-09-26: 2 mg via INTRAVENOUS

## 2020-09-26 MED ORDER — 0.9 % SODIUM CHLORIDE (POUR BTL) OPTIME
TOPICAL | Status: DC | PRN
  Administered 2020-09-26: 1000 mL

## 2020-09-26 MED ORDER — SODIUM CHLORIDE 0.9 % IR SOLN
Status: DC | PRN
  Administered 2020-09-26 (×2): 3000 mL

## 2020-09-26 MED ORDER — CHLORHEXIDINE GLUCONATE 0.12 % MT SOLN
OROMUCOSAL | Status: AC
  Administered 2020-09-26: 15 mL via OROMUCOSAL
  Filled 2020-09-26: qty 15

## 2020-09-26 MED ORDER — ONDANSETRON HCL 4 MG/2ML IJ SOLN
INTRAMUSCULAR | Status: DC | PRN
  Administered 2020-09-26: 4 mg via INTRAVENOUS

## 2020-09-26 MED ORDER — FENTANYL CITRATE (PF) 250 MCG/5ML IJ SOLN
INTRAMUSCULAR | Status: AC
  Filled 2020-09-26: qty 5

## 2020-09-26 MED ORDER — MEPERIDINE HCL 25 MG/ML IJ SOLN: 6.2500 mg | INTRAMUSCULAR | Status: DC | PRN

## 2020-09-26 MED ORDER — HYDROMORPHONE HCL 1 MG/ML IJ SOLN: 0.2500 mg | INTRAMUSCULAR | Status: DC | PRN

## 2020-09-26 MED ORDER — VANCOMYCIN HCL 1000 MG IV SOLR
INTRAVENOUS | Status: DC | PRN
  Administered 2020-09-26: 1000 mg

## 2020-09-26 MED ORDER — OXYCODONE HCL 5 MG PO TABS
5.0000 mg | ORAL_TABLET | Freq: Once | ORAL | Status: AC | PRN
Start: 2020-09-26 — End: 2020-09-26
  Administered 2020-09-26: 5 mg via ORAL

## 2020-09-26 MED ORDER — LACTATED RINGERS IV SOLN: INTRAVENOUS | Status: DC

## 2020-09-26 MED ORDER — CHLORHEXIDINE GLUCONATE 0.12 % MT SOLN: 15.0000 mL | Freq: Once | OROMUCOSAL | Status: AC

## 2020-09-26 MED ORDER — PHENYLEPHRINE HCL-NACL 10-0.9 MG/250ML-% IV SOLN
INTRAVENOUS | Status: DC | PRN
  Administered 2020-09-26: 50 ug/min via INTRAVENOUS

## 2020-09-26 MED ORDER — MIDAZOLAM HCL 2 MG/2ML IJ SOLN
0.5000 mg | Freq: Once | INTRAMUSCULAR | Status: DC | PRN
Start: 2020-09-26 — End: 2020-09-26

## 2020-09-26 MED ORDER — BUPIVACAINE HCL (PF) 0.5 % IJ SOLN
INTRAMUSCULAR | Status: DC | PRN
  Administered 2020-09-26: 10 mL

## 2020-09-26 MED ORDER — LIDOCAINE 2% (20 MG/ML) 5 ML SYRINGE
INTRAMUSCULAR | Status: AC
  Filled 2020-09-26: qty 5

## 2020-09-26 MED ORDER — OXYCODONE HCL 5 MG/5ML PO SOLN
5.0000 mg | Freq: Once | ORAL | Status: AC | PRN
Start: 2020-09-26 — End: 2020-09-26

## 2020-09-26 SURGICAL SUPPLY — 63 items
APL PRP STRL LF DISP 70% ISPRP (MISCELLANEOUS) ×1
BAG COUNTER SPONGE SURGICOUNT (BAG) ×2 IMPLANT
BAG SPNG CNTER NS LX DISP (BAG) ×1
BLADE AVERAGE 25X9 (BLADE) ×1 IMPLANT
BNDG CMPR 9X4 STRL LF SNTH (GAUZE/BANDAGES/DRESSINGS)
BNDG CMPR MED 10X6 ELC LF (GAUZE/BANDAGES/DRESSINGS) ×1
BNDG ELASTIC 4X5.8 VLCR STR LF (GAUZE/BANDAGES/DRESSINGS) ×1 IMPLANT
BNDG ELASTIC 6X10 VLCR STRL LF (GAUZE/BANDAGES/DRESSINGS) ×1 IMPLANT
BNDG ESMARK 4X9 LF (GAUZE/BANDAGES/DRESSINGS) IMPLANT
BNDG GAUZE ELAST 4 BULKY (GAUZE/BANDAGES/DRESSINGS) IMPLANT
CHLORAPREP W/TINT 26 (MISCELLANEOUS) ×2 IMPLANT
COVER SURGICAL LIGHT HANDLE (MISCELLANEOUS) ×2 IMPLANT
CUFF TOURN SGL QUICK 18X4 (TOURNIQUET CUFF) ×1 IMPLANT
CUFF TOURN SGL QUICK 34 (TOURNIQUET CUFF)
CUFF TRNQT CYL 34X4.125X (TOURNIQUET CUFF) IMPLANT
DRAPE U-SHAPE 47X51 STRL (DRAPES) ×2 IMPLANT
DRSG XEROFORM 1X8 (GAUZE/BANDAGES/DRESSINGS) ×1 IMPLANT
ELECT CAUTERY BLADE 6.4 (BLADE) ×2 IMPLANT
ELECT REM PT RETURN 9FT ADLT (ELECTROSURGICAL) ×2
ELECTRODE REM PT RTRN 9FT ADLT (ELECTROSURGICAL) ×1 IMPLANT
GAUZE 4X4 16PLY ~~LOC~~+RFID DBL (SPONGE) ×1 IMPLANT
GAUZE SPONGE 4X4 12PLY STRL (GAUZE/BANDAGES/DRESSINGS) ×1 IMPLANT
GAUZE SPONGE 4X4 12PLY STRL LF (GAUZE/BANDAGES/DRESSINGS) ×1 IMPLANT
GLOVE SRG 8 PF TXTR STRL LF DI (GLOVE) ×1 IMPLANT
GLOVE SURG ENC MOIS LTX SZ7.5 (GLOVE) ×2 IMPLANT
GLOVE SURG UNDER POLY LF SZ8 (GLOVE) ×2
GOWN STRL REUS W/ TWL LRG LVL3 (GOWN DISPOSABLE) ×1 IMPLANT
GOWN STRL REUS W/ TWL XL LVL3 (GOWN DISPOSABLE) ×1 IMPLANT
GOWN STRL REUS W/TWL LRG LVL3 (GOWN DISPOSABLE) ×2
GOWN STRL REUS W/TWL XL LVL3 (GOWN DISPOSABLE) ×2
HANDPIECE INTERPULSE COAX TIP (DISPOSABLE) ×2
HEMOSTAT SURGICEL 2X14 (HEMOSTASIS) ×1 IMPLANT
KIT BASIN OR (CUSTOM PROCEDURE TRAY) ×2 IMPLANT
KIT TURNOVER KIT B (KITS) ×2 IMPLANT
MANIFOLD NEPTUNE II (INSTRUMENTS) ×2 IMPLANT
NDL BIOPSY JAMSHIDI 8X6 (NEEDLE) IMPLANT
NDL HYPO 25GX1X1/2 BEV (NEEDLE) IMPLANT
NEEDLE BIOPSY JAMSHIDI 8X6 (NEEDLE) IMPLANT
NEEDLE HYPO 25GX1X1/2 BEV (NEEDLE) ×2 IMPLANT
NS IRRIG 1000ML POUR BTL (IV SOLUTION) ×2 IMPLANT
PACK ORTHO EXTREMITY (CUSTOM PROCEDURE TRAY) ×2 IMPLANT
PAD ARMBOARD 7.5X6 YLW CONV (MISCELLANEOUS) ×4 IMPLANT
PAD CAST 4YDX4 CTTN HI CHSV (CAST SUPPLIES) IMPLANT
PADDING CAST ABS 4INX4YD NS (CAST SUPPLIES) ×1
PADDING CAST ABS COTTON 4X4 ST (CAST SUPPLIES) IMPLANT
PADDING CAST COTTON 4X4 STRL (CAST SUPPLIES) ×2
PADDING CAST COTTON 6X4 STRL (CAST SUPPLIES) ×1 IMPLANT
PROBE DEBRIDE SONICVAC MISONIX (TIP) IMPLANT
SET CYSTO W/LG BORE CLAMP LF (SET/KITS/TRAYS/PACK) ×2 IMPLANT
SET HNDPC FAN SPRY TIP SCT (DISPOSABLE) IMPLANT
SLEEVE SCD COMPRESS KNEE MED (STOCKING) ×1 IMPLANT
SOL PREP POV-IOD 4OZ 10% (MISCELLANEOUS) ×4 IMPLANT
SPONGE T-LAP 18X18 ~~LOC~~+RFID (SPONGE) ×1 IMPLANT
STAPLER VISISTAT 35W (STAPLE) ×1 IMPLANT
SUT ETHILON 3 0 PS 1 (SUTURE) ×3 IMPLANT
SUT VIC AB 2-0 CT1 27 (SUTURE) ×2
SUT VIC AB 2-0 CT1 TAPERPNT 27 (SUTURE) IMPLANT
SUT VIC AB 3-0 FS2 27 (SUTURE) ×1 IMPLANT
SYR CONTROL 10ML LL (SYRINGE) IMPLANT
TOWEL GREEN STERILE (TOWEL DISPOSABLE) ×2 IMPLANT
TOWEL GREEN STERILE FF (TOWEL DISPOSABLE) ×2 IMPLANT
TUBE CONNECTING 12X1/4 (SUCTIONS) ×2 IMPLANT
YANKAUER SUCT BULB TIP NO VENT (SUCTIONS) ×2 IMPLANT

## 2020-09-26 NOTE — Op Note (Signed)
  Patient Name: Carl [name] DOB: 1965-12-23  MRN: [id number]   Date of Surgery: 09/26/20  Surgeon: Dr. Hardie Pulley, DPM Assistants: none  Pre-operative Diagnosis:  * No Diagnosis Codes entered * Post-operative Diagnosis:  * No Diagnosis Codes entered * Procedures:  1) Third metatarsal partial resection  2) Delayed wound closure Pathology/Specimens: ID Type Source Tests Collected by Time Destination  1 : THIRD METATARSAL BONE Tissue Bone SURGICAL PATHOLOGY Evelina Bucy, DPM 09/26/2020 0816   A : SWAB CULTURE LEFT FOOT Wound Foot, Left AEROBIC/ANAEROBIC CULTURE W GRAM STAIN (SURGICAL/DEEP WOUND) Evelina Bucy, DPM 09/26/2020 0804   B : THIRD METATARSAL BONE FOR CULTURE Tissue Bone AEROBIC/ANAEROBIC CULTURE W Lonell Grandchild STAIN (SURGICAL/DEEP WOUND) Evelina Bucy, DPM 09/26/2020 [phone]    Anesthesia: MAC And to reduce dead space:  Total Tourniquet Time Documented: Calf (Left) - 49 minutes Total: Calf (Left) - 49 minutes  Estimated Blood Loss: 75 mL Materials: * No implants in log * Medications: 1g Vancomycin topical Complications: none  Indications for Procedure:  This is a 55 y.o. male who previously underwent incision and drainage and third toe amputation for severe infection.  It was discussed to benefit from further metatarsal resection and wound closure.  All risk benefits terms discussed the patient no guarantees given   Procedure in Detail: Patient was identified in pre-operative holding area. Formal consent was signed and the left lower extremity was marked. Patient was brought back to the operating room. Anesthesia was induced. The extremity was prepped and draped in the usual sterile fashion. Timeout was taken to confirm patient name, laterality, and procedure prior to incision.   Attention was then directed to the left foot.  There is some mild to moderate purulence at the dorsal aspect of the wound which was collected for culture.  The incision was lengthened over the  third metatarsal area the area was then copiously irrigated with pulse lavage for approximately 1 L.  The wound was then sharply excisionally debrided with a 15 blade and rongeur.  The wound was again irrigated with pulse lavage.  An incision was made overlying the third metatarsal capsule and the third metatarsal was examined.  The third metatarsal approximately appear to be firm and healthy without any signs of infection.  A sagittal saw was then used to excise the significant portion of the third metatarsal.  This was collected for pathology.  An additional bone specimen of the remaining metatarsal was collected with a rongeur and sent for microbiology.  Hemostasis was achieved with cautery  The wound was then further irrigated.  A total of 6 L of irrigation was used during the procedure.  Surgicel was packed into the wound to reduce dead space and for hemostasis.  The wound was then closed in layers with 2-0 Vicryl, 3-0 Vicryl, 3-0 nylon and skin staples  The foot was then dressed with Betadine Xeroform 4 x 4 Kerlix and Ace bandage. Patient tolerated the procedure well.   Disposition: Following a period of post-operative monitoring, patient will be transferred back to the floor.  He will likely be good for discharge tomorrow. Believed surgical cure of OM. We will monitor cultures outpatient for any further antibiotics.Marland Kitchen

## 2020-09-26 NOTE — Anesthesia Postprocedure Evaluation (Signed)
Anesthesia Post Note  Patient: Carl [name]  Procedure(s) Performed: DEBRIDEMENT WOUND LEFT FOOT (Left)     Patient location during evaluation: PACU Anesthesia Type: MAC Level of consciousness: awake and alert, patient cooperative and oriented Pain management: pain level controlled Vital Signs Assessment: post-procedure vital signs reviewed and stable Respiratory status: spontaneous breathing, nonlabored ventilation and respiratory function stable Cardiovascular status: blood pressure returned to baseline and stable Postop Assessment: no apparent nausea or vomiting Anesthetic complications: no   No notable events documented.  Last Vitals:  Vitals:   09/26/20 0920 09/26/20 0935  BP: 135/80 (!) 157/74  Pulse: (!) 58 (!) 59  Resp: 13 14  Temp:    SpO2: 98% 98%    Last Pain:  Vitals:   09/26/20 0935  TempSrc:   PainSc: 0-No pain                 Nyiah Pianka,[employer]

## 2020-09-26 NOTE — Progress Notes (Signed)
Price MD was called about kerlex under ace wrap was bleeding. MD wants the original dressing left alone. An ABD pad was placed under the ace wrap to reinforce dressing, the floor nurse may unwrap ace wrap and change out ABD pads and then rewrap the ace wrap on the patients foot and ankle. Will continue to monitor.

## 2020-09-26 NOTE — Transfer of Care (Signed)
Immediate Anesthesia Transfer of Care Note  Patient: Carl [name]  Procedure(s) Performed: DEBRIDEMENT WOUND LEFT FOOT (Left)  Patient Location: PACU  Anesthesia Type:MAC  Level of Consciousness: awake and patient cooperative  Airway & Oxygen Therapy: Patient Spontanous Breathing and Patient connected to face mask oxygen  Post-op Assessment: Report given to RN and Post -op Vital signs reviewed and stable  Post vital signs: Reviewed and stable  Last Vitals:  Vitals Value Taken Time  BP 134/78 09/26/20 0905  Temp 36.6 C 09/26/20 0905  Pulse 57 09/26/20 0914  Resp 13 09/26/20 0914  SpO2 97 % 09/26/20 0914  Vitals shown include unvalidated device data.  Last Pain:  Vitals:   09/26/20 0905  TempSrc:   PainSc: 0-No pain      Patients Stated Pain Goal: 2 (78/58/85 0277)  Complications: No notable events documented.

## 2020-09-26 NOTE — Progress Notes (Addendum)
FPTS Brief Progress Note  S: Pt was sleeping comfortably in bed. I did not wake up the patient. No concerns from night RN.   O: BP (!) 142/82 (BP Location: Left Arm)   Pulse 79   Temp 99.7 F (37.6 C) (Oral)   Resp 16   Ht 5\' 10"  (1.778 m)   Wt 108.7 kg   SpO2 95%   BMI 34.38 kg/m    General: sleeping comfortably, no acute distress Cardio: well perfused Pulm: normal work of breathing   A/P: Left foot osteomyelitis with cellulitis S/p I&D and 3rd left toe partial amputation today. EBL 75cc -Continue Cefipime and Flagyl -Monitor post operative pain -Oxycodone 5mg  6PRN, tylenol 650mg  Q6PRN - Orders reviewed. Labs for AM ordered, which was adjusted as needed.  -Per podiatry-possible d/c tomorrow  , MD 09/26/2020, 9:45 PM PGY-3, [facility] Night Resident  Please page [phone] with questions.

## 2020-09-26 NOTE — Interval H&P Note (Signed)
History and Physical Interval Note:  09/26/2020 7:35 AM  Carl [name]  has presented today for surgery, with the diagnosis of Osteomyelitis, abscess left foot.  The various methods of treatment have been discussed with the patient and family. After consideration of risks, benefits and other options for treatment, the patient has consented to  Procedure(s): DEBRIDEMENT WOUND LEFT FOOT (Left) as a surgical intervention.  The patient's history has been reviewed, patient examined, no change in status, stable for surgery.  I have reviewed the patient's chart and labs.  Questions were answered to the patient's satisfaction.     Park Liter

## 2020-09-26 NOTE — Anesthesia Procedure Notes (Signed)
Procedure Name: MAC Date/Time: 09/26/2020 7:45 AM Performed by: Renato Shin, CRNA Pre-anesthesia Checklist: Patient identified, Emergency Drugs available, Suction available and Patient being monitored Patient Re-evaluated:Patient Re-evaluated prior to induction Oxygen Delivery Method: Simple face mask Preoxygenation: Pre-oxygenation with 100% oxygen Induction Type: IV induction Placement Confirmation: positive ETCO2 and breath sounds checked- equal and bilateral Dental Injury: Teeth and Oropharynx as per pre-operative assessment

## 2020-09-27 ENCOUNTER — Encounter (HOSPITAL_COMMUNITY): Payer: Self-pay | Admitting: Podiatry

## 2020-09-27 LAB — COMPREHENSIVE METABOLIC PANEL
ALT: 32 U/L (ref 0–44)
AST: 27 U/L (ref 15–41)
Albumin: 2.5 g/dL — ABNORMAL LOW (ref 3.5–5.0)
Alkaline Phosphatase: 72 U/L (ref 38–126)
Anion gap: 9 (ref 5–15)
BUN: 8 mg/dL (ref 6–20)
CO2: 26 mmol/L (ref 22–32)
Calcium: 8.4 mg/dL — ABNORMAL LOW (ref 8.9–10.3)
Chloride: 96 mmol/L — ABNORMAL LOW (ref 98–111)
Creatinine, Ser: 0.57 mg/dL — ABNORMAL LOW (ref 0.61–1.24)
GFR, Estimated: >60 mL/min (ref >60)
Glucose, Bld: 244 mg/dL — ABNORMAL HIGH (ref 70–99)
Potassium: 4.1 mmol/L (ref 3.5–5.1)
Sodium: 131 mmol/L — ABNORMAL LOW (ref 135–145)
Total Bilirubin: 0.8 mg/dL (ref 0.3–1.2)
Total Protein: 6.3 g/dL — ABNORMAL LOW (ref 6.5–8.1)

## 2020-09-27 LAB — CBC
HCT: 33.9 % — ABNORMAL LOW (ref 39.0–52.0)
Hemoglobin: 11.2 g/dL — ABNORMAL LOW (ref 13.0–17.0)
MCH: 27.8 pg (ref 26.0–34.0)
MCHC: 33 g/dL (ref 30.0–36.0)
MCV: 84.1 fL (ref 80.0–100.0)
Platelets: 314 10*3/uL (ref 150–400)
RBC: 4.03 MIL/uL — ABNORMAL LOW (ref 4.22–5.81)
RDW: 13.8 % (ref 11.5–15.5)
WBC: 11.1 10*3/uL — ABNORMAL HIGH (ref 4.0–10.5)
nRBC: 0 % (ref 0.0–0.2)

## 2020-09-27 LAB — GLUCOSE, CAPILLARY
Glucose-Capillary: 177 mg/dL — ABNORMAL HIGH (ref 70–99)
Glucose-Capillary: 245 mg/dL — ABNORMAL HIGH (ref 70–99)
Glucose-Capillary: 232 mg/dL — ABNORMAL HIGH (ref 70–99)
Glucose-Capillary: 237 mg/dL — ABNORMAL HIGH (ref 70–99)
Glucose-Capillary: 266 mg/dL — ABNORMAL HIGH (ref 70–99)

## 2020-09-27 LAB — AEROBIC/ANAEROBIC CULTURE W GRAM STAIN (SURGICAL/DEEP WOUND)

## 2020-09-27 MED ORDER — LOSARTAN POTASSIUM 50 MG PO TABS
50.0000 mg | ORAL_TABLET | Freq: Every day | ORAL | Status: DC
  Administered 2020-09-28: 50 mg via ORAL
  Filled 2020-09-27: qty 1

## 2020-09-27 MED ORDER — CEFDINIR 300 MG PO CAPS
300.0000 mg | ORAL_CAPSULE | Freq: Two times a day (BID) | ORAL | Status: DC
  Administered 2020-09-27 – 2020-09-28 (×2): 300 mg via ORAL
  Filled 2020-09-27 (×3): qty 1

## 2020-09-27 MED ORDER — METFORMIN HCL ER 500 MG PO TB24
500.0000 mg | ORAL_TABLET | Freq: Every day | ORAL | Status: DC
  Administered 2020-09-27 – 2020-09-28 (×2): 500 mg via ORAL
  Filled 2020-09-27 (×3): qty 1

## 2020-09-27 NOTE — Progress Notes (Addendum)
Inpatient Diabetes Program Recommendations  AACE/ADA: New Consensus Statement on Inpatient Glycemic Control (2015)  Target Ranges:  Prepandial:   less than 140 mg/dL      Peak postprandial:   less than 180 mg/dL (1-2 hours)      Critically ill patients:  140 - 180 mg/dL   Lab Results  Component Value Date   GLUCAP 232 (H) 09/27/2020   HGBA1C 10.7 (H) 09/21/2020    Review of Glycemic Control Results for Carl [name], Carl [name] (MRN [id number]) as of 09/27/2020 11:36  Ref. Range 09/26/2020 16:05 09/26/2020 21:46 09/27/2020 06:05 09/27/2020 10:56  Glucose-Capillary Latest Ref Range: 70 - 99 mg/dL 732 (H) 202 (H) 542 (H) 232 (H)  Diabetes history: DM 2 Outpatient Diabetes medications: None been out of meds for 6 months Current orders for Inpatient glycemic control:  Novolog 0-15 units tid   A1c 10.7% this admission   Inpatient Diabetes Program Recommendations:    Consider further increasing Lantus to 24 units QD.    Based on A1c and need for wound healing recommend basal insulin at time of d/c. Pt agreeable and willing.  Addendum: Spoke with patient to reinforce concepts discussed from previous coordinator's visit on 09/24/20.  Reviewed patient's current A1c of 10.7%. Explained what a A1c is and what it measures. Also reviewed goal A1c with patient, importance of good glucose control @ home, and blood sugar goals.  Blood glucose meter provided to patient. Reviewed recommended frequency and when to call md. Patient has no further questions and plans to follow up at the clinic.   Thanks, Lujean Rave, MSN, RNC-OB Diabetes Coordinator [phone] (8a-5p)

## 2020-09-27 NOTE — Progress Notes (Signed)
FPTS Brief Progress Note  S: Pt laying comfortably in bed this evening. Pain well controlled with oxycodone. No acute concerns.   O: BP 130/72 (BP Location: Left Arm)   Pulse 66   Temp 98.3 F (36.8 C) (Oral)   Resp 18   Ht 5\' 10"  (1.778 m)   Wt 108.7 kg   SpO2 93%   BMI 34.38 kg/m    General: Alert, no acute distress, comfortable  Cardio: well perfused  Pulm: normal work of breathing Extremities: No peripheral edema., right LE in ace wrap  Neuro: Cranial nerves grossly intact   A/P: Left foot osteomyelitis with cellulitis  POD #1 3rd left toe partial amputation -Oxycodone 5mg  Q4PRN, tylenol 650mg  Q6PRN  -De-escalated antibiotics to Cefdinir today - Orders reviewed. Labs for AM ordered, which was adjusted as needed.    , MD 09/27/2020, 10:00 PM PGY-3, [facility] Night Resident  Please page [phone] with questions.

## 2020-09-27 NOTE — Progress Notes (Signed)
Family Medicine Teaching Service Daily Progress Note Intern Pager: [phone]  Patient name: Carl [name] Medical record number: [id number] Date of birth: 09-03-1965 Age: 55 y.o. Gender: male  Primary Care Provider: Alicia Amel, MD Consultants: Podiatry Code Status: Full  Pt Overview and Major Events to Date:  7/12-admitted 7/13-L 3rd toe amputation, I&D 7/17-L 3rd metatarsal partial resection  Assessment and Plan: Carl [name] is a 55yo male with a history of uncontrolled diabetes, HTN, HLD who presented with necrosis of the left third toe, found to have osteomyelitis and overlying cellulitis. Now s/p L third toe amputation, I&D, and third metatarsal partial resection.   L foot osteomyelitis w/ cellulitis PAD Post-op day 1 from third metatarsal partial resection. Post-surgical pain well-controlled on Oxy 5mg  q4. Sensitivities have returned for Strep and Proteus spp isolated from deep wound cultures. Both are sensitive to CTX. No anaerobic growth at five days. Anxious about discharging before he is able to ambulate/get his pain under control.  - Ambulate with PT prior to discharge - Will d/c flagyl - Transition to Cefdinir, day 7 / 14 of abx - Tylenol and Oxycodone for pain - Hopeful for discharge tomorrow - Continue aspirin and atorvastatin  Diabetes CBGs 202-253. Has not had outpatient primary care in >1 year, on no home meds for that time period. Was previously on metformin and canagliflozin. - Continue Lantus 15u - Starting metformin 500mg  daily - CBGs, SSI - Will send home on Metformin and glipizide  HTN Remains hypertensive on newly prescribed losartan 25mg  daily. - Will increase to 50mg  daily; titrate on an outpatient basis  FEN/GI: Carb modified PPx: Heparin Dispo:Home tomorrow. Barriers include pain control, transition to PO antibiotics.   Subjective:  Carl [name] reports that he had a great deal of post-surgical pain yesterday but is much improved this morning.  When  asked how he felt about possible discharge today, he states that he would much prefer to wait until tomorrow due to concerns for mobility and to give his daughter time to prepare for in-home care.   Objective: Temp:  [97.9 F (36.6 C)-99.7 F (37.6 C)] 98.4 F (36.9 C) (07/18 0352) Pulse Rate:  [56-79] 67 (07/18 0352) Resp:  [13-19] 19 (07/18 0352) BP: (134-175)/(71-91) 155/78 (07/18 0352) SpO2:  [94 %-99 %] 95 % (07/18 0352) Physical Exam: General: Well-appearing, NAD Cardiovascular: regular rate, regular rhythm, no murmur, rub, gallops Respiratory: clear to auscultation, normal WOB on RA Abdomen: Normal bowel sounds, soft, non-tender Extremities: LLE with surgical bandage in place, no erythema or streaking proximally  Laboratory: Recent Labs  Lab 09/25/20 0558 09/26/20 0348 09/27/20 0502  WBC 6.5 7.8 11.1*  HGB 11.1* 11.9* 11.2*  HCT 34.0* 36.3* 33.9*  PLT 304 328 314   Recent Labs  Lab 09/21/20 1030 09/22/20 0741 09/25/20 0558 09/26/20 0348 09/27/20 0502  NA 131*   < > 134* 135 131*  K 3.8   < > 4.0 3.9 4.1  CL 96*   < > 100 100 96*  CO2 24   < > 29 28 26   BUN 11   < > 7 6 8   CREATININE 0.64   < > 0.53* 0.57* 0.57*  CALCIUM 8.6*   < > 8.2* 8.7* 8.4*  PROT 7.0  --   --   --  6.3*  BILITOT 0.9  --   --   --  0.8  ALKPHOS 94  --   --   --  72  ALT 25  --   --   --  32  AST 19  --   --   --  27  GLUCOSE 322*   < > 252* 202* 244*   < > = values in this interval not displayed.      Imaging/Diagnostic Tests: No new imaging/tests  Alicia Amel, MD 09/27/2020, 9:01 AM PGY-1, [facility] FPTS Intern pager: [phone], text pages welcome

## 2020-09-28 ENCOUNTER — Other Ambulatory Visit (HOSPITAL_COMMUNITY): Payer: Self-pay

## 2020-09-28 LAB — CBC
HCT: 32.7 % — ABNORMAL LOW (ref 39.0–52.0)
Hemoglobin: 11 g/dL — ABNORMAL LOW (ref 13.0–17.0)
MCH: 27.9 pg (ref 26.0–34.0)
MCHC: 33.6 g/dL (ref 30.0–36.0)
MCV: 83 fL (ref 80.0–100.0)
Platelets: 283 10*3/uL (ref 150–400)
RBC: 3.94 MIL/uL — ABNORMAL LOW (ref 4.22–5.81)
RDW: 13.8 % (ref 11.5–15.5)
WBC: 8.8 10*3/uL (ref 4.0–10.5)
nRBC: 0 % (ref 0.0–0.2)

## 2020-09-28 LAB — GLUCOSE, CAPILLARY
Glucose-Capillary: 203 mg/dL — ABNORMAL HIGH (ref 70–99)
Glucose-Capillary: 341 mg/dL — ABNORMAL HIGH (ref 70–99)
Glucose-Capillary: 252 mg/dL — ABNORMAL HIGH (ref 70–99)

## 2020-09-28 MED ORDER — GLIPIZIDE 5 MG PO TABS
2.5000 mg | ORAL_TABLET | Freq: Every day | ORAL | 0 refills | Status: DC
  Filled 2020-09-28: qty 15, 30d supply, fill #0

## 2020-09-28 MED ORDER — ATORVASTATIN CALCIUM 20 MG PO TABS
20.0000 mg | ORAL_TABLET | Freq: Every day | ORAL | 0 refills | Status: DC
  Filled 2020-09-28: qty 30, 30d supply, fill #0

## 2020-09-28 MED ORDER — ACETAMINOPHEN 325 MG PO TABS: 650.0000 mg | ORAL_TABLET | Freq: Four times a day (QID) | ORAL | Status: AC | PRN

## 2020-09-28 MED ORDER — CEFDINIR 300 MG PO CAPS
300.0000 mg | ORAL_CAPSULE | Freq: Two times a day (BID) | ORAL | 0 refills | Status: AC
  Filled 2020-09-28: qty 13, 7d supply, fill #0

## 2020-09-28 MED ORDER — LOSARTAN POTASSIUM 50 MG PO TABS
50.0000 mg | ORAL_TABLET | Freq: Every day | ORAL | 0 refills | Status: DC
  Filled 2020-09-28: qty 30, 30d supply, fill #0

## 2020-09-28 MED ORDER — METFORMIN HCL ER 500 MG PO TB24
500.0000 mg | ORAL_TABLET | Freq: Two times a day (BID) | ORAL | 0 refills | Status: DC
  Filled 2020-09-28: qty 30, 15d supply, fill #0

## 2020-09-28 MED ORDER — OXYCODONE HCL 5 MG PO TABS
5.0000 mg | ORAL_TABLET | ORAL | 0 refills | Status: AC | PRN
  Filled 2020-09-28: qty 18, 3d supply, fill #0

## 2020-09-28 MED ORDER — ASPIRIN 81 MG PO TBEC
81.0000 mg | DELAYED_RELEASE_TABLET | Freq: Every day | ORAL | 11 refills | Status: DC
  Filled 2020-09-28: qty 30, 30d supply, fill #0

## 2020-09-28 NOTE — Progress Notes (Signed)
Carl [name] to be D/C'd Home per MD order.  Discussed with the patient and all questions fully answered.   VSS, Skin clean, dry and intact without evidence of skin break down, no evidence of skin tears noted. IV catheter discontinued intact. Site without signs and symptoms of complications. Dressing and pressure applied.   An After Visit Summary was printed and given to the patient.    D/C education completed with patient/family including follow up instructions, medication list, d/c activities limitations if indicated, with other d/c instructions as indicated by MD - patient able to verbalize understanding, all questions fully answered.    Patient instructed to return to ED, call 911, or call MD for any changes in condition.    Patient escorted via WC, and D/C home via private car.

## 2020-09-28 NOTE — Care Management (Signed)
Patient was entered into [facility] yesterday and scripts were [facility] to [facility]

## 2020-09-28 NOTE — Progress Notes (Signed)
  Subjective:  Patient ID: Carl [name], male    DOB: 06-26-1965,  MRN: [id number]  A 55 y.o. male PMH is significant for T2MD, HTN, and HLD presents with left foot abscess and osteomyelitis status post multiple incision and drainage with third ray resection with primary closure.  Patient states he is doing well.  No acute pain.  He is ready be discharged today.  No nausea fever chills vomiting Objective:   Vitals:   09/27/20 2306 09/28/20 0521  BP: 119/72 (!) 146/78  Pulse: 71 70  Resp: 19 18  Temp: 98.5 F (36.9 C) 98.7 F (37.1 C)  SpO2: 95% 100%   General AA&O x3. Normal mood and affect.  Vascular Dorsalis pedis and posterior tibial pulses 2/4 bilat. Brisk capillary refill to all digits. Pedal hair present.  Neurologic Epicritic sensation grossly intact.  Dermatologic Incision is well coapted.  No clinical signs of dehiscence noted.  Mild erythema noted around incision site.  No calf pain.  Orthopedic: MMT 5/5 in dorsiflexion, plantarflexion, inversion, and eversion. Normal joint ROM without pain or crepitus.     Assessment & Plan:  Patient was evaluated and treated and all questions answered.  Left foot osteomyelitis/abscess status post incision drainage with left third ray partial resection -All questions concerns were addressed -Patient is okay to be discharged from podiatric standpoint. -Betadine wet-to-dry dressing changes 3 times a week. -He will follow-up with Dr. Samuella Cota 1 week from discharge -Partial weightbearing to the heel with a Darco wedge shoe on the left side -Patient okay to be transition on p.o. antibiotics we will continue t to follow cultures. -Rest of the care per primary team Candelaria Stagers, DPM  Accessible via secure chat for questions or concerns.

## 2020-09-28 NOTE — Progress Notes (Signed)
Patient given discharge instructions and stated understanding.  Waiting on TOC meds

## 2020-09-28 NOTE — Plan of Care (Signed)

## 2020-09-28 NOTE — Evaluation (Signed)
Physical Therapy Re-Evaluation & D/C Patient Details Name: Carl [name] MRN: [id number] DOB: 1965/05/22 Today's Date: 09/28/2020   History of Present Illness  55 y.o. male admitted on 09/21/20 for L foot osteomyelitis and cellulits s/p L middle toe I and D and amputation on 09/22/20.  09/26/20 revision of amputation. Pt with significant PMH of DM, HTN, and hand surgery.  Clinical Impression  Pt is s/p amputation revision and MD asked for PT Re-Eval prior to discharge today. Pt continues to be independent with bed mobility and mod I for transfers and ambulation with SPC. Pt is competent with don/doffing CAM boot. Educated on HEP, with focus on maintaining dorsiflexion and need for LE mobility hourly. Pt verbalizes understanding. Educated that pt may need additional PT for gait training after CAM boot has been discontinued. Pt has no additional PT or equipment needs. PT signing off.       Follow Up Recommendations No PT follow up    Equipment Recommendations  Other (comment) (daughter has purchased [facility] for home use)       Precautions / Restrictions Precautions Required Braces or Orthoses: Other Brace Other Brace: CAM boot left, however, dressing is too big for it to fit right now. Restrictions Weight Bearing Restrictions: Yes LLE Weight Bearing: Weight bearing as tolerated Other Position/Activity Restrictions: with CAM walker      Mobility  Bed Mobility Overal bed mobility: Independent                  Transfers Overall transfer level: Modified independent                  Ambulation/Gait Ambulation/Gait assistance: Modified independent (Device/Increase time) Gait Distance (Feet): 200 Feet Assistive device: Straight cane Gait Pattern/deviations: Step-to pattern;Antalgic Gait velocity: slowed Gait velocity interpretation: <1.8 ft/sec, indicate of risk for recurrent falls General Gait Details: had pt put sneaker on R foot to help with height distance of CAM  boot  Stairs Stairs: Yes       General stair comments: verbally reviewed up with the good down with the sore one and recommended use of rail.        Balance Overall balance assessment: Mild deficits observed, not formally tested                                           Pertinent Vitals/Pain Pain Assessment: Faces Faces Pain Scale: Hurts a little bit Pain Location: left foot Pain Descriptors / Indicators: Grimacing;Guarding Pain Intervention(s): Limited activity within patient's tolerance;Monitored during session;Repositioned     Communication      Cognition Arousal/Alertness: Awake/alert Behavior During Therapy: WFL for tasks assessed/performed Overall Cognitive Status: Within Functional Limits for tasks assessed                                        General Comments General comments (skin integrity, edema, etc.): Pt with seepage on dressing at end of ambulation, RN notified. VSS on RA    Exercises General Exercises - Lower Extremity Ankle Circles/Pumps: AROM;Left;10 reps;Supine Quad Sets: AROM;Left;5 reps Gluteal Sets: AROM;Left;5 reps Heel Slides: AROM;Left;5 reps Hip ABduction/ADduction: AROM;Left;5 reps;Supine Straight Leg Raises: AROM;Left;5 reps;Supine                PT Goals (Current goals can be found in the  Care Plan section)  Acute Rehab PT Goals PT Goal Formulation: All assessment and education complete, DC therapy     AM-PAC PT "6 Clicks" Mobility  Outcome Measure Help needed turning from your back to your side while in a flat bed without using bedrails?: None Help needed moving from lying on your back to sitting on the side of a flat bed without using bedrails?: None Help needed moving to and from a bed to a chair (including a wheelchair)?: None Help needed standing up from a chair using your arms (e.g., wheelchair or bedside chair)?: None Help needed to walk in hospital room?: None Help needed climbing  3-5 steps with a railing? : None 6 Click Score: 24    End of Session Equipment Utilized During Treatment: Other (comment) (CAM boot) Activity Tolerance: Patient limited by pain Patient left: in bed;with call bell/phone within reach Nurse Communication: Mobility status PT Visit Diagnosis: Difficulty in walking, not elsewhere classified (R26.2);Pain Pain - Right/Left: Left Pain - part of body: Ankle and joints of foot    Time: 7902-4097 PT Time Calculation (min) (ACUTE ONLY): 17 min   Charges:   PT Evaluation $PT Re-evaluation: 1 Re-eval          Carl [name] Carl [name] PT, DPT Acute Rehabilitation Services Pager [phone] Office [phone]   Carl [name] Carl [name] 09/28/2020, 9:53 AM

## 2020-09-28 NOTE — Discharge Summary (Signed)
[facility] Discharge Summary  Patient name: Carl [name] Medical record number: [id number] Date of birth: [birth year] Age: 55 y.o. Gender: male Date of Admission: 09/21/2020  Date of Discharge: 09/28/2020 Admitting Physician: Zenia Resides, MD  Primary Care Provider: Eppie Gibson, MD Consultants: Podiatry  Indication for Hospitalization: L Foot Abscess and Osteomyelitis  Discharge Diagnoses/Problem List:  Active Problems:   Osteomyelitis ([place])   Cellulitis and abscess of toe of left foot   Diabetic foot infection ([place])   Abscess of left foot   Planned postoperative wound closure   Disposition: Home  Discharge Condition: Stable, post-surgical  Discharge Exam:  General: Well-appearing, NAD Cardiovascular: Regular rate, regular rhythm, no murmurs, rubs, gallops Respiratory: Clear to auscultation bilaterally, normal work of breathing on room air Abdomen: Normal bowel sounds, soft, nontender Extremities: Left lower extremity with surgical bandage in place, no erythema or streaking proximally.  See photo from podiatry note    [facility] Hospital Course:  Carl [name] is a 55 year old male who presented with worsening necrosis of the medial toe of the left foot with overlying cellulitis.  PMH is significant for HTN, T2MD and hyperlipidemia.  He has not been on medication for over a year.  Left foot osteomyelitis with cellulitis Patient presented in the ED on 7/12 with worsening infection of the middle toe on the left foot.  Patient said he started noticing infection of the middle toe in he left foot 2 weeks ago.  He described it as being red and swollen but over time he started noticing the tissue getting darker with worsening pain.  About 2 days ago the pain started becoming unbearable and he noticed the red coloration from the toe spreading up his foot closer to his ankle.  The skin overlying the toe became a lot darker and drains foul-smelling fluid.   Patient indicated that he was treating the wound with hydrogen peroxide to prevent infection.  On arrival in the ED patient was initially treated with antibiotics of 2538m of IV Vanco vancomycin and 3.375g of Zosyn.  He also received 543mof Dilaudid for pain control.  MRI, blood culture and ABI were ordered.  His initial abs showed lactic acid within normal limits and leukocytosis of 14.1 WBC.  MRI study showed moderate edema of the middle toe which is consistent with osteomyelitis and also positive for fluid and gas within the extensor tendon sheath of the middle toe.  Following MRI finding patient's antibiotics include IV cefepime 2g, Zosyn 3.375 g, and vancomycin 2500 mg. Podiatry was consulted and performed incision and drained with removal of non viable tissue and bone, including amputation of the third toe on 7/13. His pain was well controlled and his overlying cellulitis much improved with IV antibiotics. He was taken back to the OR on 7/17 and had a surgical cure of his osteomyelitis. Antibiotics were narrowed to oral cefdinir based on deep wound cultures and he was deemed stable for discharge on 7/19.  Diabetes Carl [name] being without medication for several months prior to hospitalization. His A1c on admission was  10.7. In the past he has taken metformin and canagliflozin. He was treating with basal and sliding scale insulin in the hospital and was restarted on metformin on 7/18. He was discharged on metformin and glipizide due to the affordability of these meds with no insurance.   HTN As above, Carl [name] not had any controller meds in several months. He was persistently hypertensive in the hospital  and was started on Losartan, first at 23m and then titrated to 545mdaily.    Discharge recommendation HTN: Started on losartan while inpatient. PCP to follow up BP and make adjustments as indicated.  DM: A1c on admission 10.7. Discharged on metformin, glipizide. PCP to follow up and  titrate.  PAD: Dx during hospital stay based on ABI. Started on aspirin 81 mg daily and atorvastatin 2043maily. PCP to follow.        Significant Procedures:  7/13- L 3rd toe amputation, I&D 7/17- L 3rd metatarsal partial resection  Significant Labs and Imaging:  Recent Labs  Lab 09/26/20 0348 09/27/20 0502 09/28/20 0442  WBC 7.8 11.1* 8.8  HGB 11.9* 11.2* 11.0*  HCT 36.3* 33.9* 32.7*  PLT 328 314 283   Recent Labs  Lab 09/23/20 0427 09/24/20 0414 09/25/20 0558 09/26/20 0348 09/27/20 0502  NA 133* 135 134* 135 131*  K 4.1 3.7 4.0 3.9 4.1  CL 100 100 100 100 96*  CO2 25 27 29 28 26   GLUCOSE 252* 224* 252* 202* 244*  BUN 10 7 7 6 8   CREATININE 0.53* 0.49* 0.53* 0.57* 0.57*  CALCIUM 8.4* 8.4* 8.2* 8.7* 8.4*  ALKPHOS  --   --   --   --  72  AST  --   --   --   --  27  ALT  --   --   --   --  32  ALBUMIN  --   --   --   --  2.5*     Results/Tests Pending at Time of Discharge: None   Discharge Medications:  Allergies as of 09/28/2020       Reactions   Morphine And Related Other (See Comments)   hallucinations        Medication List     STOP taking these medications    canagliflozin 100 MG Tabs tablet Commonly known as: Invokana   lisinopril 10 MG tablet Commonly known as: ZESTRIL   metFORMIN 1000 MG tablet Commonly known as: GLUCOPHAGE Replaced by: metFORMIN 500 MG 24 hr tablet   pravastatin 40 MG tablet Commonly known as: PRAVACHOL       TAKE these medications    acetaminophen 325 MG tablet Commonly known as: TYLENOL Take 2 tablets (650 mg total) by mouth every 6 (six) hours as needed for mild pain or fever.   Apple Cider Vinegar 500 MG Tabs Take 1,000 mg by mouth 2 (two) times daily.   Aspirin Low Dose 81 MG EC tablet Generic drug: aspirin Take 1 tablet (81 mg total) by mouth daily. Swallow whole.   atorvastatin 20 MG tablet Commonly known as: LIPITOR Take 1 tablet (20 mg total) by mouth daily.   blood glucose meter kit and  supplies Kit Dispense based on patient and insurance preference. Use up to four times daily as directed. (FOR ICD-9 250.00, 250.01).   cefdinir 300 MG capsule Commonly known as: OMNICEF Take 1 capsule (300 mg total) by mouth every 12 (twelve) hours for 7 days.   diphenhydrAMINE 25 MG tablet Commonly known as: BENADRYL Take 25-50 mg by mouth every 6 (six) hours as needed for sleep.   Fish Oil 500 MG Caps Take 500 mg by mouth every evening.   glipiZIDE 5 MG tablet Commonly known as: Glucotrol Take 0.5 tablets (2.5 mg total) by mouth daily before breakfast.   ibuprofen 200 MG tablet Commonly known as: ADVIL Take 800 mg by mouth every 6 (six) hours as needed  for mild pain.   losartan 50 MG tablet Commonly known as: COZAAR Take 1 tablet (50 mg total) by mouth daily.   metFORMIN 500 MG 24 hr tablet Commonly known as: GLUCOPHAGE-XR Take 1 tablet (500 mg total) by mouth 2 (two) times daily with a meal. Replaces: metFORMIN 1000 MG tablet   oxyCODONE 5 MG immediate release tablet Commonly known as: Oxy IR/ROXICODONE Take 1 tablet (5 mg total) by mouth every 4 (four) hours as needed for up to 3 days for moderate pain.   Turmeric 500 MG Tabs Take 500 mg by mouth 2 (two) times daily.               Discharge Care Instructions  (From admission, onward)           Start     Ordered   09/28/20 0000  Discharge wound care:       Comments: See instructions   09/28/20 1047            Discharge Instructions: Please refer to Patient Instructions section of EMR for full details.  Patient was counseled important signs and symptoms that should prompt return to medical care, changes in medications, dietary instructions, activity restrictions, and follow up appointments.   Follow-Up Appointments:  Follow-up Information     Ganta, Anupa, DO Follow up on 10/04/2020.   Specialty: Family Medicine Why: At 2:45pm. Please arrive by 2:30pm. This is your hospital follow up appointment  with your new primary care clinic. Contact information: [place] [address] [phone]         Eppie Gibson, MD Follow up on 10/27/2020.   Specialty: Student Why: At 9:00am. Please arrive by 8:45am. This is your appointment to meet your new PCP and officially establish with our clinic. Contact information: [place] [address] [phone]         Evelina Bucy, DPM. Schedule an appointment as soon as possible for a visit.   Specialty: Podiatry Why: Follow up with podiatry as instructed. Contact information: [address] [address] [phone]                 Eppie Gibson, MD 09/28/2020, 11:43 AM PGY-1, [place]

## 2020-09-28 NOTE — Discharge Instructions (Addendum)
Dear Carl [name],  Thank you for letting us participate in your care. You were hospitalized for pain in your left foot and diagnosed with a bacterial infection in the bone (osteomyelitis) and in the surrounding tissues (cellulitis). You were treated with IV antibiotics and surgical removal of your left third toe and part of the third metatarsal (mid-foot bone). It has been a joy getting to know you and we look forward to being your primary care doctors.    POST-HOSPITAL & CARE INSTRUCTIONS Please take your antibiotics as prescribed. You are on a medication called cefdinir which you will take every 12 hours. Your last day of antibiotics should be Sunday, July 24th.  You may continue taking your oxycodone as needed for post-surgical pain for a few days.  For your diabetes, we are starting you on metformin and a medication called glipizide. Both of these are affordable options and we will adjust your doses on an outpatient basis For your hypertension, we are starting you on a medication called losartan. We will also adjust this on an outpatient basis. Go to your follow up appointments (listed below)   DOCTOR'S APPOINTMENT   Future Appointments  Date Time Provider Department Center  10/04/2020  2:45 PM Reece Leader, DO [facility]  10/27/2020  9:00 AM Alicia Amel, MD [facility]    Follow-up Information     Reece Leader, DO Follow up on 10/04/2020.   Specialty: Family Medicine Why: At 2:45pm. Please arrive by 2:30pm. This is your hospital follow up appointment with your new primary care clinic. Contact information: [address] [place] [address] [phone]         Alicia Amel, MD Follow up on 10/27/2020.   Specialty: Student Why: At 9:00am. Please arrive by 8:45am. This is your appointment to meet your new PCP and officially establish with our clinic. Contact information: [address] [place] [address] [phone]         Park Liter, DPM.  Schedule an appointment as soon as possible for a visit.   Specialty: Podiatry Why: Follow up with podiatry as instructed. Contact information: [address]  [place] [address] [phone]                 Take care and be well!  Family Medicine Teaching Service Inpatient Team [place]  [facility]  [address] [place], [place] [address] [phone]

## 2020-09-28 NOTE — Plan of Care (Signed)
Patient understanding plan of care and discharge instructions

## 2020-09-29 LAB — AEROBIC/ANAEROBIC CULTURE W GRAM STAIN (SURGICAL/DEEP WOUND)

## 2020-09-29 LAB — SURGICAL PATHOLOGY

## 2020-10-01 ENCOUNTER — Ambulatory Visit (INDEPENDENT_AMBULATORY_CARE_PROVIDER_SITE_OTHER): Payer: Self-pay

## 2020-10-01 ENCOUNTER — Other Ambulatory Visit: Payer: Self-pay

## 2020-10-01 ENCOUNTER — Ambulatory Visit (INDEPENDENT_AMBULATORY_CARE_PROVIDER_SITE_OTHER): Payer: Self-pay | Admitting: Podiatry

## 2020-10-01 DIAGNOSIS — E11628 Type 2 diabetes mellitus with other skin complications: Secondary | ICD-10-CM

## 2020-10-01 DIAGNOSIS — L089 Local infection of the skin and subcutaneous tissue, unspecified: Secondary | ICD-10-CM

## 2020-10-01 DIAGNOSIS — Z9889 Other specified postprocedural states: Secondary | ICD-10-CM

## 2020-10-01 LAB — AEROBIC/ANAEROBIC CULTURE W GRAM STAIN (SURGICAL/DEEP WOUND): Culture: NO GROWTH

## 2020-10-01 NOTE — Progress Notes (Signed)
  Subjective:  Patient ID: Carl [name], male    DOB: 02-11-1966,  MRN: [id number]  Chief Complaint  Patient presents with   Routine Post Op    pt from hospital surgery/post op 1/partial amputation   DOS: 09/26/20 Procedure:             1) Third metatarsal partial resection             2) Delayed wound closure  DOS: 09/22/20 Procedure:             1) Left foot incision and drainage of complex abscess             2) 3rd toe amputation left  55 y.o. male presents with the above complaint. History confirmed with patient.   Objective:  Physical Exam: no tenderness at the surgical site, local edema noted, and calf supple, nontender. Incision: healing well, mod serosanguinous drainage, no dehiscence, no significant erythema  No images are attached to the encounter.  Radiographs: X-ray of the left foot: no soft tissue emphysema, no signs of osteomyelitis and consistent with post-op state Assessment:   1. Diabetic foot infection (HCC)   2. Post-operative state    Plan:  Patient was evaluated and treated and all questions answered.  Post-operative State -Dressing applied consisting of sterile gauze, kerlix, and ACE bandage -WBAT in CAM boot -XRs needed at follow-up: none  No follow-ups on file.

## 2020-10-04 ENCOUNTER — Ambulatory Visit (INDEPENDENT_AMBULATORY_CARE_PROVIDER_SITE_OTHER): Payer: Self-pay | Admitting: Family Medicine

## 2020-10-04 ENCOUNTER — Encounter: Payer: Self-pay | Admitting: Family Medicine

## 2020-10-04 ENCOUNTER — Other Ambulatory Visit: Payer: Self-pay

## 2020-10-04 VITALS — BP 140/75 | HR 85 | Ht 70.0 in | Wt 237.8 lb

## 2020-10-04 DIAGNOSIS — E119 Type 2 diabetes mellitus without complications: Secondary | ICD-10-CM

## 2020-10-04 DIAGNOSIS — M869 Osteomyelitis, unspecified: Secondary | ICD-10-CM

## 2020-10-04 DIAGNOSIS — I10 Essential (primary) hypertension: Secondary | ICD-10-CM

## 2020-10-04 MED ORDER — METFORMIN HCL ER 500 MG PO TB24: 500.0000 mg | ORAL_TABLET | Freq: Two times a day (BID) | ORAL | 0 refills | Status: DC

## 2020-10-04 NOTE — Assessment & Plan Note (Signed)
-  continue glipizide and metformin, refills on metformin provided -glucose levels improved -encouraged scheduling ophthalmology appointment and to have regular annual follow up thereafter  -diet and exercise counseling provided -encouraged to maintain record of blood glucose levels and bring in to next appointment  -f/u 8/17 with PCP, consider repeat A1c at this time and make changes as appropriate

## 2020-10-04 NOTE — Assessment & Plan Note (Signed)
-  resolved, f/u podiatry Friday and as appropriate -completion of antibiotic course today -f/u as appropriate

## 2020-10-04 NOTE — Progress Notes (Signed)
    SUBJECTIVE:   CHIEF COMPLAINT / HPI:   Left foot osteomyelitis Patient presents for hospital follow up after having osteo of left foot possibly with contributing factor of poorly controlled diabetes. While in the hospital, given vancomycin and zosyn but then discharged on 7 day course of cefdinir for which is he on his last day of treatment. Has been following up with podiatry outpatient regularly and has his next appointment this upcoming Friday. Was previously told by them that he will not be able to return to work until 2 weeks from now.  Hypertension Started on losartan while in the hospital which was increased to 50 mg by the time of discharge. Previously not on any antihypertensives. Endorses daily compliance. Denies chest pain, dyspnea, palpitations and leg swelling. Endorses trying to dramatically change his diet by eating plenty of vegetables and lean meats along with avoid bread.   DM Previously poorly controlled, A1c 10.7 from hospital stay. Endorses lifestyle modifications as listed above. Blood glucose ranged around 300s prior to discharge home. Brought in record of blood glucose levels daily, ranging around 150-250s with one reading at 300. Denies any dizziness, weakness, vision changes or other symptoms consistent with hypoglycemic episodes. Compliant of glipizide 5 mg and metformin. Tolerating both medications well. Has not seen an ophthalmologist in years.   OBJECTIVE:   BP 140/75   Pulse 85   Ht 5\' 10"  (1.778 m)   Wt 237 lb 12.8 oz (107.9 kg)   SpO2 100%   BMI 34.12 kg/m   General: Patient well-appearing, in no acute distress. CV: RRR, no murmurs or gallops auscultated Resp: CTAB, no wheezing, rales or rhonchi noted Abdomen: soft, nontender, nondistended, presence of bowel sounds Ext: no RLE edema noted bilaterally, left foot covered in boot, radial pulses strong and equal bilaterally Psych: mood appropriate   ASSESSMENT/PLAN:   Osteomyelitis (HCC) -resolved,  f/u podiatry Friday and as appropriate -completion of antibiotic course today -f/u as appropriate   Hypertension -BP 140/75, at goal -continue losartan -diet and exercise counseling provided -consider repeat BMP at next visit, f/u with PCP (Dr. Thursday) on 8/17  Diabetes type 2, controlled -continue glipizide and metformin, refills on metformin provided -glucose levels improved -encouraged scheduling ophthalmology appointment and to have regular annual follow up thereafter  -diet and exercise counseling provided -encouraged to maintain record of blood glucose levels and bring in to next appointment  -f/u 8/17 with PCP, consider repeat A1c at this time and make changes as appropriate    -Med rec reviewed and updated accordingly. -Discussed importance of COVID vaccine, patient still considering but would like to defer to next visit.   9/17, DO  [facility]

## 2020-10-04 NOTE — Patient Instructions (Signed)
It was great seeing you today!  I am glad you are feeling better since your discharge from the hospital. Your blood sugar levels look much better, continue to eat healthy, exercise and take all prescribed medication. I have provided you with refills of your metformin.   Please follow up at your next scheduled appointment on 8/17 with Dr. Marisue Humble, if anything arises between now and then, please don't hesitate to contact our office.   Thank you for allowing [place] to be a part of your medical care!  Thank you, Dr. Robyne Peers

## 2020-10-04 NOTE — Assessment & Plan Note (Signed)
-  BP 140/75, at goal -continue losartan -diet and exercise counseling provided -consider repeat BMP at next visit, f/u with PCP (Dr. Marisue Humble) on 8/17

## 2020-10-05 ENCOUNTER — Ambulatory Visit: Payer: Self-pay | Admitting: Podiatry

## 2020-10-08 ENCOUNTER — Other Ambulatory Visit: Payer: Self-pay

## 2020-10-08 ENCOUNTER — Ambulatory Visit (INDEPENDENT_AMBULATORY_CARE_PROVIDER_SITE_OTHER): Payer: Self-pay | Admitting: Podiatry

## 2020-10-08 DIAGNOSIS — E11628 Type 2 diabetes mellitus with other skin complications: Secondary | ICD-10-CM

## 2020-10-08 DIAGNOSIS — Z9889 Other specified postprocedural states: Secondary | ICD-10-CM

## 2020-10-08 DIAGNOSIS — L089 Local infection of the skin and subcutaneous tissue, unspecified: Secondary | ICD-10-CM

## 2020-10-08 NOTE — Progress Notes (Signed)
  Subjective:  Patient ID: Carl [name], male    DOB: 1965-11-16,  MRN: [id number]  Chief Complaint  Patient presents with   Wound Check    Denies fever/chills/nausea/vomiting. Pt states he finished his antibiotics on 7.25.2022. Pt hat not needed pain medication other than tylenol.   DOS: 09/26/20 Procedure:             1) Third metatarsal partial resection             2) Delayed wound closure  DOS: 09/22/20 Procedure:             1) Left foot incision and drainage of complex abscess             2) 3rd toe amputation left  55 y.o. male presents with the above complaint. History confirmed with patient.   Objective:  Physical Exam: no tenderness at the surgical site, local edema noted, and calf supple, nontender. Incision: healing well distally, new 1.5x dehiscence proximally with fibrous base, mod serosanguinous drainage, no significant erythema   Assessment:   1. Diabetic foot infection (HCC)   2. Post-operative state    Plan:  Patient was evaluated and treated and all questions answered.  Post-operative State -Start saline WTD daily -Discussed off-loading and taking pressure off of his foot. -Continue CAM boot. -XRs needed at follow-up: none  Return in about 2 weeks (around 10/22/2020) for Post-Op (No XRs).

## 2020-10-22 ENCOUNTER — Ambulatory Visit (INDEPENDENT_AMBULATORY_CARE_PROVIDER_SITE_OTHER): Payer: Self-pay | Admitting: Podiatry

## 2020-10-22 ENCOUNTER — Other Ambulatory Visit: Payer: Self-pay

## 2020-10-22 DIAGNOSIS — L089 Local infection of the skin and subcutaneous tissue, unspecified: Secondary | ICD-10-CM

## 2020-10-22 DIAGNOSIS — E11628 Type 2 diabetes mellitus with other skin complications: Secondary | ICD-10-CM

## 2020-10-22 NOTE — Progress Notes (Signed)
  Subjective:  Patient ID: Carl [name], male    DOB: Feb 27, 1966,  MRN: [id number]  Chief Complaint  Patient presents with   Routine Post Op    POV Pt states no new concerns, denies fever/chills/nausea/vomiting.   DOS: 09/26/20 Procedure:             1) Third metatarsal partial resection             2) Delayed wound closure  DOS: 09/22/20 Procedure:             1) Left foot incision and drainage of complex abscess             2) 3rd toe amputation left  55 y.o. male presents with the above complaint. History confirmed with patient.   Objective:  Physical Exam: no tenderness at the surgical site, local edema noted, and calf supple, nontender. Incision: healing well distally, stable 1.5x1.5  dehiscence proximally with fibrous base, mod serosanguinous drainage, no significant erythema   Assessment:   1. Diabetic foot infection (HCC)    Plan:  Patient was evaluated and treated and all questions answered.  Post-operative State -Continue saline WTD daily. Applied today -Continue CAM boot. -Should wound still be slow to heal consider debridement and possible STSG or skin graft substitute -Staple removal next visit -XRs needed at follow-up: none  Return in about 1 week (around 10/29/2020) for Post-Op (No XRs), Wound Care.

## 2020-10-24 NOTE — Progress Notes (Signed)
    SUBJECTIVE:   CHIEF COMPLAINT / HPI:   Diabetic Follow Up: Patient is a 55 y.o. male who present today for diabetic follow up.   Patient endorses no problems  Home medications include: Metformin 500mg  BID, Glipizide 2.5mg  daily Patient endorses taking these medications as prescribed.  He has also made several diet interventions, including cutting out simple carbs and sugary beverages. He eats mostly fatty fish and vegetables.   Most recent A1Cs:  Lab Results  Component Value Date   HGBA1C 10.7 (H) 09/21/2020   HGBA1C 11.3 (H) 08/17/2015   HGBA1C 9.4 (H) 04/16/2015   Last Microalbumin, LDL, Creatinine: Lab Results  Component Value Date   MICROALBUR 1.2 01/19/2015   LDLCALC 78 09/21/2020   CREATININE 0.57 (L) 09/27/2020   Patient does check blood glucose on a regular basis.Sugars run 130s-230s. No symptoms of hypoglycemia/   Patient is not up to date on diabetic eye.   Hypertension: Patient is a 55 y.o. male who present today for follow up of hypertension.   Patient endorses no problems  Home medications include: Losartan 50mg  daily Patient endorses taking these medications as prescribed.  Most recent creatinine trend:  Lab Results  Component Value Date   CREATININE 0.57 (L) 09/27/2020   CREATININE 0.57 (L) 09/26/2020   CREATININE 0.53 (L) 09/25/2020   Patient does not check blood pressure at home.  Patient has not had a BMP since initiating Losartan therapy.   Health Maintenance: He would like to move forward with health maintenance interventions but is unable to afford them at this time as he has been out of work since his recent surgery.  Dr. 09/28/2020 discussed the COVID-vaccine with him at his last visit and he says he is still "on the fence about it."  He is able and willing to apply for [facility] and [facility] financial assistance.  PERTINENT  PMH / PSH: T2DM, HTN, Recent hospitalization for osteomyelitis, s/p L third toe amputation  OBJECTIVE:   BP  130/82   Pulse 74   Ht 5\' 10"  (1.778 m)   Wt 249 lb 12.8 oz (113.3 kg)   SpO2 100%   BMI 35.84 kg/m   Gen: Well-appearing, orthopedic boot in place on LLE Cardio: RRR, no m/r/g Pulm: Normal WOB on RA Ext: Post-surgical foot not examined, no edema or RLE   ASSESSMENT/PLAN:   Hypertension BP 130/82, at goal today.  - Encouraged continued adherence to healthy diet - Continue Losartan at 50mg  daily - BMP today - Exercise once out of post-surgical period    Type 2 diabetes mellitus without complications (HCC) Blood glucose levels above goal, however, making progress, especially with dietary interventions. - A1c in six weeks - Increase metformin to 1000mg  BID - Continue glipizide at 2.5mg  daily  - Unable to afford other agents at this time. If he gets financial assistance, may consider replacing glipizide with GLP1 or SGLT2   Healthcare maintenance Patient unable to pursue colon cancer screening at this time given uninsured status and currently out of work. - [place] and [facility] given - Patient to "think about" COVID vaccine before next visit - Revisit Colon CA screening once we determine whether he can get financial assistance      [facility], MD [facility]

## 2020-10-27 ENCOUNTER — Ambulatory Visit (INDEPENDENT_AMBULATORY_CARE_PROVIDER_SITE_OTHER): Payer: Self-pay | Admitting: Student

## 2020-10-27 ENCOUNTER — Other Ambulatory Visit: Payer: Self-pay

## 2020-10-27 ENCOUNTER — Encounter: Payer: Self-pay | Admitting: Student

## 2020-10-27 VITALS — BP 130/82 | HR 74 | Ht 70.0 in | Wt 249.8 lb

## 2020-10-27 DIAGNOSIS — E119 Type 2 diabetes mellitus without complications: Secondary | ICD-10-CM

## 2020-10-27 DIAGNOSIS — Z0000 Encounter for general adult medical examination without abnormal findings: Secondary | ICD-10-CM | POA: Insufficient documentation

## 2020-10-27 DIAGNOSIS — I10 Essential (primary) hypertension: Secondary | ICD-10-CM

## 2020-10-27 DIAGNOSIS — E785 Hyperlipidemia, unspecified: Secondary | ICD-10-CM

## 2020-10-27 MED ORDER — ATORVASTATIN CALCIUM 20 MG PO TABS: 20.0000 mg | ORAL_TABLET | Freq: Every day | ORAL | 0 refills | Status: DC

## 2020-10-27 MED ORDER — GLIPIZIDE 5 MG PO TABS: 2.5000 mg | ORAL_TABLET | Freq: Every day | ORAL | 3 refills | Status: DC

## 2020-10-27 MED ORDER — ASPIRIN EC 81 MG PO TBEC: 81.0000 mg | DELAYED_RELEASE_TABLET | Freq: Every day | ORAL | 11 refills | Status: AC

## 2020-10-27 MED ORDER — LOSARTAN POTASSIUM 50 MG PO TABS: 50.0000 mg | ORAL_TABLET | Freq: Every day | ORAL | 3 refills | Status: DC

## 2020-10-27 MED ORDER — METFORMIN HCL ER 500 MG PO TB24
1000.0000 mg | ORAL_TABLET | Freq: Two times a day (BID) | ORAL | 3 refills | Status: DC
Start: 2020-10-27 — End: 2021-03-11

## 2020-10-27 NOTE — Patient Instructions (Signed)
Carl [name], It is such a joy to take care you! Thank you for coming in today.   As a reminder, here is a recap of what we talked about today:  -Keep up the great work with your diet.  This is the single best thing you can do for your health right now. -We are increasing your metformin to 2 tablets twice per day.  Keep up checking your glucose at home -You can fill out the application for financial assistance.  You can either mail these back yourself or bring them into clinic next time and we will mail them for you. -Your blood pressure looks good today.  Keep up the good work. -I will see you again in six weeks.  We are checking some labs today. I will call you if they are abnormal. I will send you a MyChart message or a letter if they are normal.  If you do not hear about your labs in the next 2 weeks please let us know.  I recommend that you always bring your medications to each appointment as this makes it easy to ensure we are on the correct medications and helps [place] not miss when refills are needed.  Take care and seek immediate care sooner if you develop any concerns.   Eliezer Mccoy, MD [facility]

## 2020-10-27 NOTE — Assessment & Plan Note (Addendum)
Blood glucose levels above goal, however, making progress, especially with dietary interventions. - A1c in six weeks - Increase metformin to 1000mg  BID - Continue glipizide at 2.5mg  daily  - Unable to afford other agents at this time. If he gets financial assistance, may consider replacing glipizide with GLP1 or SGLT2

## 2020-10-27 NOTE — Assessment & Plan Note (Signed)
Patient unable to pursue colon cancer screening at this time given uninsured status and currently out of work. - [place] and [employer] financial assistance applications given - Patient to "think about" COVID vaccine before next visit - Revisit Colon CA screening once we determine whether he can get financial assistance

## 2020-10-27 NOTE — Assessment & Plan Note (Signed)
BP 130/82, at goal today.  - Encouraged continued adherence to healthy diet - Continue Losartan at 50mg  daily - BMP today - Exercise once out of post-surgical period

## 2020-10-28 LAB — BASIC METABOLIC PANEL
BUN/Creatinine Ratio: 18 (ref 9–20)
BUN: 11 mg/dL (ref 6–24)
CO2: 25 mmol/L (ref 20–29)
Calcium: 9.8 mg/dL (ref 8.7–10.2)
Chloride: 100 mmol/L (ref 96–106)
Creatinine, Ser: 0.61 mg/dL — ABNORMAL LOW (ref 0.76–1.27)
Glucose: 178 mg/dL — ABNORMAL HIGH (ref 65–99)
Potassium: 4.9 mmol/L (ref 3.5–5.2)
Sodium: 141 mmol/L (ref 134–144)
eGFR: 113 mL/min/{1.73_m2} (ref >59)

## 2020-10-29 ENCOUNTER — Ambulatory Visit (INDEPENDENT_AMBULATORY_CARE_PROVIDER_SITE_OTHER): Payer: Self-pay

## 2020-10-29 ENCOUNTER — Ambulatory Visit (INDEPENDENT_AMBULATORY_CARE_PROVIDER_SITE_OTHER): Payer: Self-pay | Admitting: Podiatry

## 2020-10-29 ENCOUNTER — Other Ambulatory Visit: Payer: Self-pay

## 2020-10-29 DIAGNOSIS — L03032 Cellulitis of left toe: Secondary | ICD-10-CM

## 2020-10-29 DIAGNOSIS — L02612 Cutaneous abscess of left foot: Secondary | ICD-10-CM

## 2020-10-29 DIAGNOSIS — E11628 Type 2 diabetes mellitus with other skin complications: Secondary | ICD-10-CM

## 2020-10-29 DIAGNOSIS — Z9889 Other specified postprocedural states: Secondary | ICD-10-CM

## 2020-10-29 DIAGNOSIS — L089 Local infection of the skin and subcutaneous tissue, unspecified: Secondary | ICD-10-CM

## 2020-10-29 MED ORDER — DOXYCYCLINE HYCLATE 100 MG PO TABS: 100.0000 mg | ORAL_TABLET | Freq: Two times a day (BID) | ORAL | 0 refills | Status: DC

## 2020-10-29 NOTE — Progress Notes (Signed)
  Subjective:  Patient ID: Carl [name], male    DOB: 1966/02/22,  MRN: [id number]  Chief Complaint  Patient presents with   Wound Check    Patient denies nausea, vomiting, fever and chills at this time. No concerns voiced today. Staples and sutures intact.    DOS: 09/26/20 Procedure:             1) Third metatarsal partial resection             2) Delayed wound closure  DOS: 09/22/20 Procedure:             1) Left foot incision and drainage of complex abscess             2) 3rd toe amputation left  55 y.o. male presents with the above complaint. History confirmed with patient.   Objective:  Physical Exam: no tenderness at the surgical site, local edema noted, and calf supple, nontender. Incision: healing well distally, stable 1.5x1.5  dehiscence proximally with fibrous base, mod serosanguinous drainage, new erythema, hematoma and small purulent exudate. Deep probing noted. Assessment:   1. Cellulitis and abscess of toe of left foot   2. Diabetic foot infection (HCC)   3. Post-operative state    Plan:  Patient was evaluated and treated and all questions answered.  Post-operative State -Redness noted to the wound today. Wound probes deep with some hematoma expressible and scant purulence. -Culture taken. Start doxycycline -Patient to pack the open wound area with sterile gauze daily -Will consider debridement if wound does not improve.  -XRs needed at follow-up: 3 view Foot  Return in about 4 days (around 11/02/2020) for Post-Op (No XRs).

## 2020-11-01 LAB — WOUND CULTURE
MICRO NUMBER:: 12266754
SPECIMEN QUALITY:: ADEQUATE

## 2020-11-02 ENCOUNTER — Other Ambulatory Visit: Payer: Self-pay

## 2020-11-02 ENCOUNTER — Ambulatory Visit (INDEPENDENT_AMBULATORY_CARE_PROVIDER_SITE_OTHER): Payer: Self-pay | Admitting: Podiatry

## 2020-11-02 DIAGNOSIS — L089 Local infection of the skin and subcutaneous tissue, unspecified: Secondary | ICD-10-CM

## 2020-11-02 DIAGNOSIS — L03032 Cellulitis of left toe: Secondary | ICD-10-CM

## 2020-11-02 DIAGNOSIS — Z9889 Other specified postprocedural states: Secondary | ICD-10-CM

## 2020-11-02 DIAGNOSIS — E11628 Type 2 diabetes mellitus with other skin complications: Secondary | ICD-10-CM

## 2020-11-02 DIAGNOSIS — L02612 Cutaneous abscess of left foot: Secondary | ICD-10-CM

## 2020-11-02 MED ORDER — LEVOFLOXACIN 250 MG PO TABS: 250.0000 mg | ORAL_TABLET | Freq: Every day | ORAL | 0 refills | Status: DC

## 2020-11-02 NOTE — Progress Notes (Signed)
Subjective:  Patient ID: Carl [name], male    DOB: 10-22-1965,  MRN: [id number]  Chief Complaint  Patient presents with   Wound Check    Wound check of left foot    DOS: 09/26/20 Procedure:             1) Third metatarsal partial resection             2) Delayed wound closure  DOS: 09/22/20 Procedure:             1) Left foot incision and drainage of complex abscess             2) 3rd toe amputation left  55 y.o. male presents with the above complaint. History confirmed with patient. Has been packing the wound daily thinks it is doing better.  Objective:  Physical Exam: no tenderness at the surgical site, local edema noted, and calf supple, nontender. Incision: healing well distally, stable 1.5x1.5  dehiscence proximally with fibrous base, mod serosanguinous drainage, new erythema, hematoma and small purulent exudate. Deep probing noted.  Results for orders placed or performed in visit on 10/29/20  WOUND CULTURE     Status: Abnormal   Collection Time: 10/29/20  4:42 PM   Specimen: Wound  Result Value Ref Range Status   MICRO NUMBER: [id number]  Final   SPECIMEN QUALITY: Adequate  Final   SOURCE: LEFT FOOT  Final   STATUS: FINAL  Final   GRAM STAIN:   Final    Moderate White blood cells seen No epithelial cells seen Moderate Gram negative bacilli   ISOLATE 1: Enterobacter cloacae (A)  Final    Comment: Heavy growth of Enterobacter cloacae      Susceptibility   Enterobacter cloacae - AEROBIC CULT, GRAM STAIN NEGATIVE 1    AMOX/CLAVULANIC >=32 Resistant     CEFAZOLIN* >=64 Resistant      * For infections other than uncomplicated UTI caused by E. coli, K. pneumoniae or P. mirabilis: Cefazolin is resistant if MIC > or = 8 mcg/mL. (Distinguishing susceptible versus intermediate for isolates with MIC < or = 4 mcg/mL requires additional testing.)     CEFTAZIDIME <=1 Sensitive     CEFEPIME <=1 Sensitive     CEFTRIAXONE <=1 Sensitive     CIPROFLOXACIN <=0.25 Sensitive      LEVOFLOXACIN <=0.12 Sensitive     GENTAMICIN <=1 Sensitive     IMIPENEM <=0.25 Sensitive     PIP/TAZO <=4 Sensitive     TOBRAMYCIN <=1 Sensitive     TRIMETH/SULFA* <=20 Sensitive      * For infections other than uncomplicated UTI caused by E. coli, K. pneumoniae or P. mirabilis: Cefazolin is resistant if MIC > or = 8 mcg/mL. (Distinguishing susceptible versus intermediate for isolates with MIC < or = 4 mcg/mL requires additional testing.) Legend: S = Susceptible  I = Intermediate R = Resistant  NS = Not susceptible * = Not tested  NR = Not reported **NN = See antimicrobic comments     Assessment:   1. Cellulitis and abscess of toe of left foot   2. Diabetic foot infection (HCC)   3. Post-operative state    Plan:  Patient was evaluated and treated and all questions answered.  Post-operative State -Redness improved.  -Culture reviewed. Switch to levaquin based on MICs -Patient to pack the open wound area with moist sterile gauze daily -Will consider debridement if wound does not improve.  -Advised if wound worsens to proceed directly to ED. -  XRs needed at follow-up: 3 view Foot  Return in about 1 week (around 11/09/2020) for Post-Op (No XRs), Wound Care.

## 2020-11-12 ENCOUNTER — Ambulatory Visit (INDEPENDENT_AMBULATORY_CARE_PROVIDER_SITE_OTHER): Payer: Self-pay | Admitting: Podiatry

## 2020-11-12 ENCOUNTER — Other Ambulatory Visit: Payer: Self-pay

## 2020-11-12 DIAGNOSIS — L03032 Cellulitis of left toe: Secondary | ICD-10-CM

## 2020-11-12 DIAGNOSIS — L02612 Cutaneous abscess of left foot: Secondary | ICD-10-CM

## 2020-11-12 DIAGNOSIS — E11628 Type 2 diabetes mellitus with other skin complications: Secondary | ICD-10-CM

## 2020-11-12 DIAGNOSIS — L089 Local infection of the skin and subcutaneous tissue, unspecified: Secondary | ICD-10-CM

## 2020-11-12 MED ORDER — LEVOFLOXACIN 250 MG PO TABS: 250.0000 mg | ORAL_TABLET | Freq: Every day | ORAL | 0 refills | Status: DC

## 2020-11-22 NOTE — Progress Notes (Signed)
  Subjective:  Patient ID: Carl [name], male    DOB: 11/18/1965,  MRN: [id number]  Chief Complaint  Patient presents with   Foot Ulcer    for Post-Op (No XRs), Wound Care   DOS: 09/26/20 Procedure:             1) Third metatarsal partial resection             2) Delayed wound closure  DOS: 09/22/20 Procedure:             1) Left foot incision and drainage of complex abscess             2) 3rd toe amputation left  55 y.o. male presents with the above complaint. History confirmed with patient. Thinks the wound is doing better, less red and is doing WTD as instructed. Objective:  Physical Exam: no tenderness at the surgical site, local edema noted, and calf supple, nontender. Incision: healing well distally, stable 1x1.5  dehiscence proximally with fibrous base, mod serosanguinous drainage, new erythema, hematoma and small purulent exudate. Deep probing noted.  Assessment:   1. Cellulitis and abscess of toe of left foot   2. Diabetic foot infection (HCC)    Plan:  Patient was evaluated and treated and all questions answered.  Post-operative State -XR without definite erosive changes -Wound continues to improve. Continue levaquin. Refilled. -Wound decreasing slowly. Packed again today. Patient to pack the open wound area with moist sterile gauze daily -Will consider debridement if wound does not improve.  -Advised if wound worsens to proceed directly to ED.  Return in about 2 weeks (around 11/26/2020) for Wound Care, Post-Op (No XRs).

## 2020-11-26 ENCOUNTER — Other Ambulatory Visit: Payer: Self-pay

## 2020-11-26 ENCOUNTER — Ambulatory Visit (INDEPENDENT_AMBULATORY_CARE_PROVIDER_SITE_OTHER): Payer: Self-pay | Admitting: Podiatry

## 2020-11-26 DIAGNOSIS — Z9889 Other specified postprocedural states: Secondary | ICD-10-CM

## 2020-11-26 DIAGNOSIS — L03032 Cellulitis of left toe: Secondary | ICD-10-CM

## 2020-11-26 DIAGNOSIS — L089 Local infection of the skin and subcutaneous tissue, unspecified: Secondary | ICD-10-CM

## 2020-11-26 DIAGNOSIS — E11628 Type 2 diabetes mellitus with other skin complications: Secondary | ICD-10-CM

## 2020-11-26 DIAGNOSIS — L02612 Cutaneous abscess of left foot: Secondary | ICD-10-CM

## 2020-11-26 NOTE — Progress Notes (Signed)
  Subjective:  Patient ID: Carl [name], male    DOB: 10-04-1965,  MRN: [id number]  Chief Complaint  Patient presents with   Wound Check    Pt states healing, denies fever/chills/nausea/vomiting. No new concerns.   DOS: 09/26/20 Procedure:             1) Third metatarsal partial resection             2) Delayed wound closure  DOS: 09/22/20 Procedure:             1) Left foot incision and drainage of complex abscess             2) 3rd toe amputation left  55 y.o. male presents with the above complaint. History confirmed with patient. Changing daily as directed states the wound is doing better and he is not having to pack gauze as deep or as much. Objective:  Physical Exam: no tenderness at the surgical site, local edema noted, and calf supple, nontender. Incision: remaining dehiscence 2.5x1*05, larger than previous but less deep. Wound without deep probing. No tunneling or undermining. No warmth, erythema, SOI.  Assessment:   1. Cellulitis and abscess of toe of left foot   2. Diabetic foot infection (HCC)   3. Post-operative state    Plan:  Patient was evaluated and treated and all questions answered.  Post-operative State -Wound continues to improve. No active warmth or redness today. Hold off further abx -Wound dressed with saline WTD today. Patient to continue as such daily. -Advised to monitor closely for signs of worsening and report promptly. -F/u in 2 weeks for recheck.  No follow-ups on file.

## 2020-12-10 ENCOUNTER — Other Ambulatory Visit: Payer: Self-pay

## 2020-12-10 ENCOUNTER — Ambulatory Visit (INDEPENDENT_AMBULATORY_CARE_PROVIDER_SITE_OTHER): Payer: Self-pay | Admitting: Podiatry

## 2020-12-10 DIAGNOSIS — M2042 Other hammer toe(s) (acquired), left foot: Secondary | ICD-10-CM

## 2020-12-17 NOTE — Progress Notes (Signed)
  Subjective:  Patient ID: Carl [name], male    DOB: [birth year],  MRN: [id number]  Chief Complaint  Patient presents with   Wound Check    Denies fever/chills/nausea/vomiting.   DOS: 09/26/20 Procedure:             1) Third metatarsal partial resection             2) Delayed wound closure  DOS: 09/22/20 Procedure:             1) Left foot incision and drainage of complex abscess             2) 3rd toe amputation left  55 y.o. male presents with the above complaint. History confirmed with patient. Changing daily as directed states the wound is doing better and he is not having to pack gauze as deep or as much. Objective:  Physical Exam: no tenderness at the surgical site, local edema noted, and calf supple, nontender. Incision: remaining dehiscence 2.5x1*05, larger than previous but less deep. Wound without deep probing. No tunneling or undermining. No warmth, erythema, SOI.  Ulceration present to the tip of the second toe approximately 0.4 cm in diameter without probe to bone.  Concomitant hammertoe deformity  Assessment:   1. Hammer toe of left foot    Plan:  Patient was evaluated and treated and all questions answered.  Post-operative State -Wound continues to improve at the dorsal foot.  This was dressed with Silvadene and dry dressing.  Patient to continue daily dressing -I discussed with patient that I am more concerned about the lesion to the second toe as it is high risk for further ulceration and infection of the digit.  We discussed proceeding with flexor tenotomy today.  Patient agrees to proceed.  We discussed the risks and benefits of the procedure.  Procedure: Flexor Tenotomy Indication for Procedure: toe with semi-reducible hammertoe with distal tip ulceration. Flexor tenotomy indicated to alleviate contracture, reduce pressure, and enhance healing of the ulceration. Location: left, 2nd toe Anesthesia: Lidocaine 1% plain; 1.5 mL and Marcaine 0.5% plain; 1.5 mL digital  block Instrumentation: 18 g needle  Technique: The toe was anesthetized as above and prepped in the usual fashion. The toe was exsanquinated and a tourniquet was secured at the base of the toe. An 18g needle was then used to percutaneously release the flexor tendon at the plantar surface of the toe with noted release of the hammertoe deformity. The incision was then dressed with antibiotic ointment and band-aid. Compression splint dressing applied. Patient tolerated the procedure well. Dressing: Dry, sterile, compression dressing. Disposition: Patient tolerated procedure well. Patient to return in 1 week for follow-up.      No follow-ups on file.

## 2020-12-24 ENCOUNTER — Inpatient Hospital Stay (HOSPITAL_COMMUNITY)
Admission: EM | Admit: 2020-12-24 | Discharge: 2020-12-30 | DRG: 475 | Disposition: A | Discharge status: Home / Self Care | Payer: Self-pay | Attending: Internal Medicine | Admitting: Internal Medicine

## 2020-12-24 ENCOUNTER — Emergency Department (HOSPITAL_COMMUNITY): Payer: Self-pay

## 2020-12-24 ENCOUNTER — Other Ambulatory Visit: Payer: Self-pay

## 2020-12-24 ENCOUNTER — Encounter (HOSPITAL_COMMUNITY): Payer: Self-pay

## 2020-12-24 ENCOUNTER — Ambulatory Visit (INDEPENDENT_AMBULATORY_CARE_PROVIDER_SITE_OTHER): Payer: Self-pay | Admitting: Podiatry

## 2020-12-24 DIAGNOSIS — E11628 Type 2 diabetes mellitus with other skin complications: Secondary | ICD-10-CM | POA: Diagnosis present

## 2020-12-24 DIAGNOSIS — E785 Hyperlipidemia, unspecified: Secondary | ICD-10-CM | POA: Diagnosis present

## 2020-12-24 DIAGNOSIS — R21 Rash and other nonspecific skin eruption: Secondary | ICD-10-CM | POA: Diagnosis present

## 2020-12-24 DIAGNOSIS — L97529 Non-pressure chronic ulcer of other part of left foot with unspecified severity: Secondary | ICD-10-CM | POA: Diagnosis present

## 2020-12-24 DIAGNOSIS — Z8249 Family history of ischemic heart disease and other diseases of the circulatory system: Secondary | ICD-10-CM

## 2020-12-24 DIAGNOSIS — L089 Local infection of the skin and subcutaneous tissue, unspecified: Secondary | ICD-10-CM | POA: Diagnosis present

## 2020-12-24 DIAGNOSIS — E1169 Type 2 diabetes mellitus with other specified complication: Secondary | ICD-10-CM | POA: Diagnosis present

## 2020-12-24 DIAGNOSIS — Y835 Amputation of limb(s) as the cause of abnormal reaction of the patient, or of later complication, without mention of misadventure at the time of the procedure: Secondary | ICD-10-CM | POA: Diagnosis present

## 2020-12-24 DIAGNOSIS — E1142 Type 2 diabetes mellitus with diabetic polyneuropathy: Secondary | ICD-10-CM | POA: Diagnosis present

## 2020-12-24 DIAGNOSIS — I10 Essential (primary) hypertension: Secondary | ICD-10-CM | POA: Diagnosis present

## 2020-12-24 DIAGNOSIS — I160 Hypertensive urgency: Secondary | ICD-10-CM | POA: Diagnosis present

## 2020-12-24 DIAGNOSIS — M86172 Other acute osteomyelitis, left ankle and foot: Secondary | ICD-10-CM

## 2020-12-24 DIAGNOSIS — Z6841 Body Mass Index (BMI) 40.0 and over, adult: Secondary | ICD-10-CM

## 2020-12-24 DIAGNOSIS — L02612 Cutaneous abscess of left foot: Secondary | ICD-10-CM

## 2020-12-24 DIAGNOSIS — T8744 Infection of amputation stump, left lower extremity: Principal | ICD-10-CM | POA: Diagnosis present

## 2020-12-24 DIAGNOSIS — Z20822 Contact with and (suspected) exposure to covid-19: Secondary | ICD-10-CM | POA: Diagnosis present

## 2020-12-24 DIAGNOSIS — L97509 Non-pressure chronic ulcer of other part of unspecified foot with unspecified severity: Secondary | ICD-10-CM | POA: Insufficient documentation

## 2020-12-24 DIAGNOSIS — Z885 Allergy status to narcotic agent status: Secondary | ICD-10-CM

## 2020-12-24 DIAGNOSIS — Z89422 Acquired absence of other left toe(s): Secondary | ICD-10-CM

## 2020-12-24 DIAGNOSIS — M869 Osteomyelitis, unspecified: Secondary | ICD-10-CM

## 2020-12-24 DIAGNOSIS — L03032 Cellulitis of left toe: Secondary | ICD-10-CM

## 2020-12-24 DIAGNOSIS — D638 Anemia in other chronic diseases classified elsewhere: Secondary | ICD-10-CM | POA: Diagnosis present

## 2020-12-24 DIAGNOSIS — E1165 Type 2 diabetes mellitus with hyperglycemia: Secondary | ICD-10-CM | POA: Diagnosis present

## 2020-12-24 DIAGNOSIS — M2042 Other hammer toe(s) (acquired), left foot: Secondary | ICD-10-CM

## 2020-12-24 DIAGNOSIS — Z79899 Other long term (current) drug therapy: Secondary | ICD-10-CM

## 2020-12-24 DIAGNOSIS — E7800 Pure hypercholesterolemia, unspecified: Secondary | ICD-10-CM

## 2020-12-24 DIAGNOSIS — Z7982 Long term (current) use of aspirin: Secondary | ICD-10-CM

## 2020-12-24 DIAGNOSIS — T8140XA Infection following a procedure, unspecified, initial encounter: Secondary | ICD-10-CM

## 2020-12-24 DIAGNOSIS — E11621 Type 2 diabetes mellitus with foot ulcer: Secondary | ICD-10-CM | POA: Diagnosis present

## 2020-12-24 DIAGNOSIS — E119 Type 2 diabetes mellitus without complications: Secondary | ICD-10-CM | POA: Insufficient documentation

## 2020-12-24 DIAGNOSIS — Z833 Family history of diabetes mellitus: Secondary | ICD-10-CM

## 2020-12-24 DIAGNOSIS — Z7984 Long term (current) use of oral hypoglycemic drugs: Secondary | ICD-10-CM

## 2020-12-24 DIAGNOSIS — Z87891 Personal history of nicotine dependence: Secondary | ICD-10-CM

## 2020-12-24 HISTORY — DX: Hyperlipidemia, unspecified: E78.5

## 2020-12-24 HISTORY — DX: Obesity, unspecified: E66.9

## 2020-12-24 LAB — CBC WITH DIFFERENTIAL/PLATELET
Abs Immature Granulocytes: 0.03 10*3/uL (ref 0.00–0.07)
Basophils Absolute: 0 10*3/uL (ref 0.0–0.1)
Basophils Relative: 0 %
Eosinophils Absolute: 0.3 10*3/uL (ref 0.0–0.5)
Eosinophils Relative: 4 %
HCT: 37.7 % — ABNORMAL LOW (ref 39.0–52.0)
Hemoglobin: 12.3 g/dL — ABNORMAL LOW (ref 13.0–17.0)
Immature Granulocytes: 0 %
Lymphocytes Relative: 24 %
Lymphs Abs: 1.8 10*3/uL (ref 0.7–4.0)
MCH: 27.4 pg (ref 26.0–34.0)
MCHC: 32.6 g/dL (ref 30.0–36.0)
MCV: 84 fL (ref 80.0–100.0)
Monocytes Absolute: 0.6 10*3/uL (ref 0.1–1.0)
Monocytes Relative: 8 %
Neutro Abs: 4.6 10*3/uL (ref 1.7–7.7)
Neutrophils Relative %: 64 %
Platelets: 232 10*3/uL (ref 150–400)
RBC: 4.49 MIL/uL (ref 4.22–5.81)
RDW: 14.6 % (ref 11.5–15.5)
WBC: 7.4 10*3/uL (ref 4.0–10.5)
nRBC: 0 % (ref 0.0–0.2)

## 2020-12-24 LAB — COMPREHENSIVE METABOLIC PANEL
ALT: 25 U/L (ref 0–44)
AST: 21 U/L (ref 15–41)
Albumin: 4.1 g/dL (ref 3.5–5.0)
Alkaline Phosphatase: 74 U/L (ref 38–126)
Anion gap: 9 (ref 5–15)
BUN: 15 mg/dL (ref 6–20)
CO2: 25 mmol/L (ref 22–32)
Calcium: 8.9 mg/dL (ref 8.9–10.3)
Chloride: 102 mmol/L (ref 98–111)
Creatinine, Ser: 0.46 mg/dL — ABNORMAL LOW (ref 0.61–1.24)
GFR, Estimated: >60 mL/min (ref >60)
Glucose, Bld: 123 mg/dL — ABNORMAL HIGH (ref 70–99)
Potassium: 3.8 mmol/L (ref 3.5–5.1)
Sodium: 136 mmol/L (ref 135–145)
Total Bilirubin: <0.1 mg/dL — ABNORMAL LOW (ref 0.3–1.2)
Total Protein: 7.7 g/dL (ref 6.5–8.1)

## 2020-12-24 LAB — PROTIME-INR
INR: 1 (ref 0.8–1.2)
Prothrombin Time: 13.6 seconds (ref 11.4–15.2)

## 2020-12-24 LAB — URINALYSIS, ROUTINE W REFLEX MICROSCOPIC
Bilirubin Urine: NEGATIVE
Glucose, UA: NEGATIVE mg/dL
Hgb urine dipstick: NEGATIVE
Ketones, ur: NEGATIVE mg/dL
Leukocytes,Ua: NEGATIVE
Nitrite: NEGATIVE
Protein, ur: NEGATIVE mg/dL
Specific Gravity, Urine: 1.01 (ref 1.005–1.030)
pH: 5 (ref 5.0–8.0)

## 2020-12-24 LAB — LACTIC ACID, PLASMA: Lactic Acid, Venous: 1 mmol/L (ref 0.5–1.9)

## 2020-12-24 LAB — RESP PANEL BY RT-PCR (FLU A&B, COVID) ARPGX2
Influenza A by PCR: NEGATIVE
Influenza B by PCR: NEGATIVE
SARS Coronavirus 2 by RT PCR: NEGATIVE

## 2020-12-24 LAB — APTT: aPTT: 29 seconds (ref 24–36)

## 2020-12-24 IMAGING — CR DG FOOT COMPLETE 3+V*L*
3 series · 3 of 3 positions shown · non-contrast
Comparison: [DATE], without report.

CLINICAL DATA: Diabetes.  Prior third toe amputation.  Fever.

EXAM:
LEFT FOOT - COMPLETE 3+ VIEW

[x foot ap left]
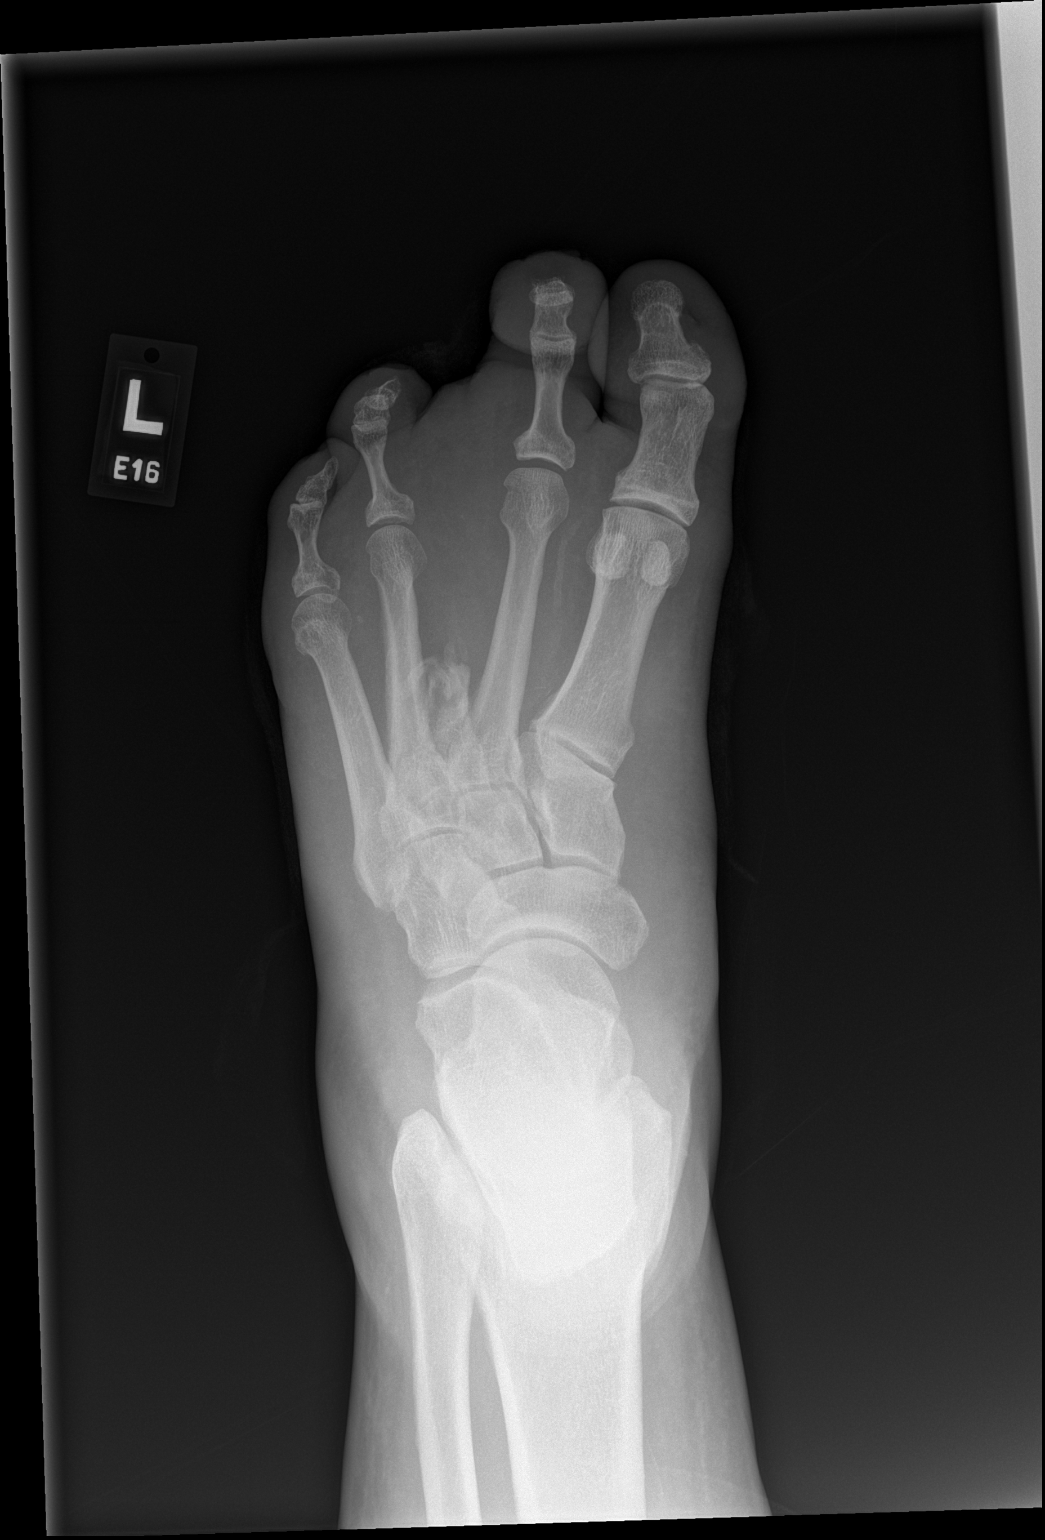

[x foot obl left]
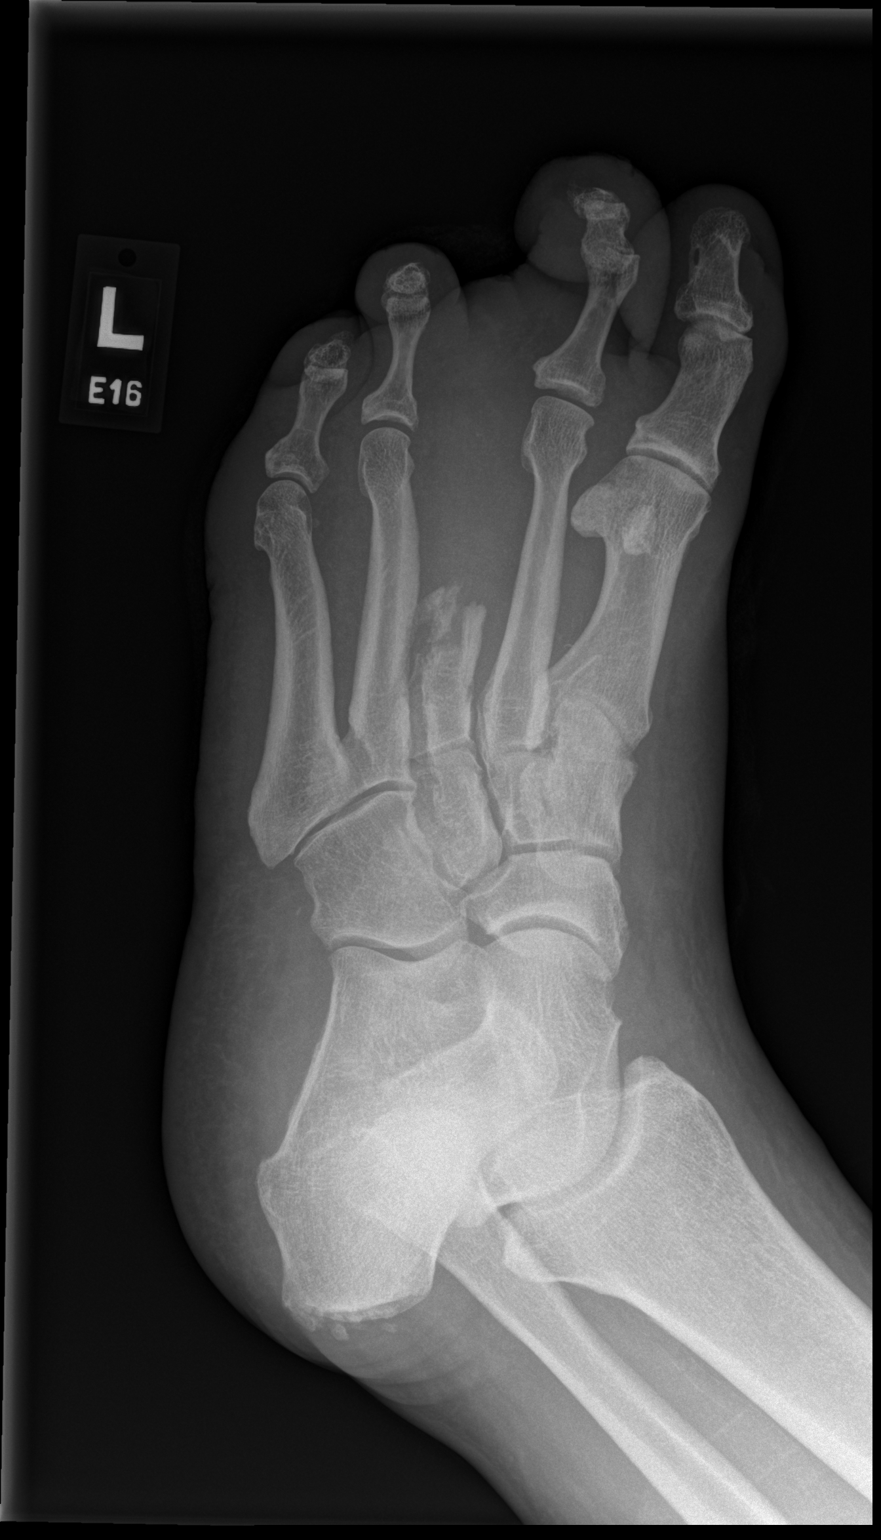

[x foot lat left]
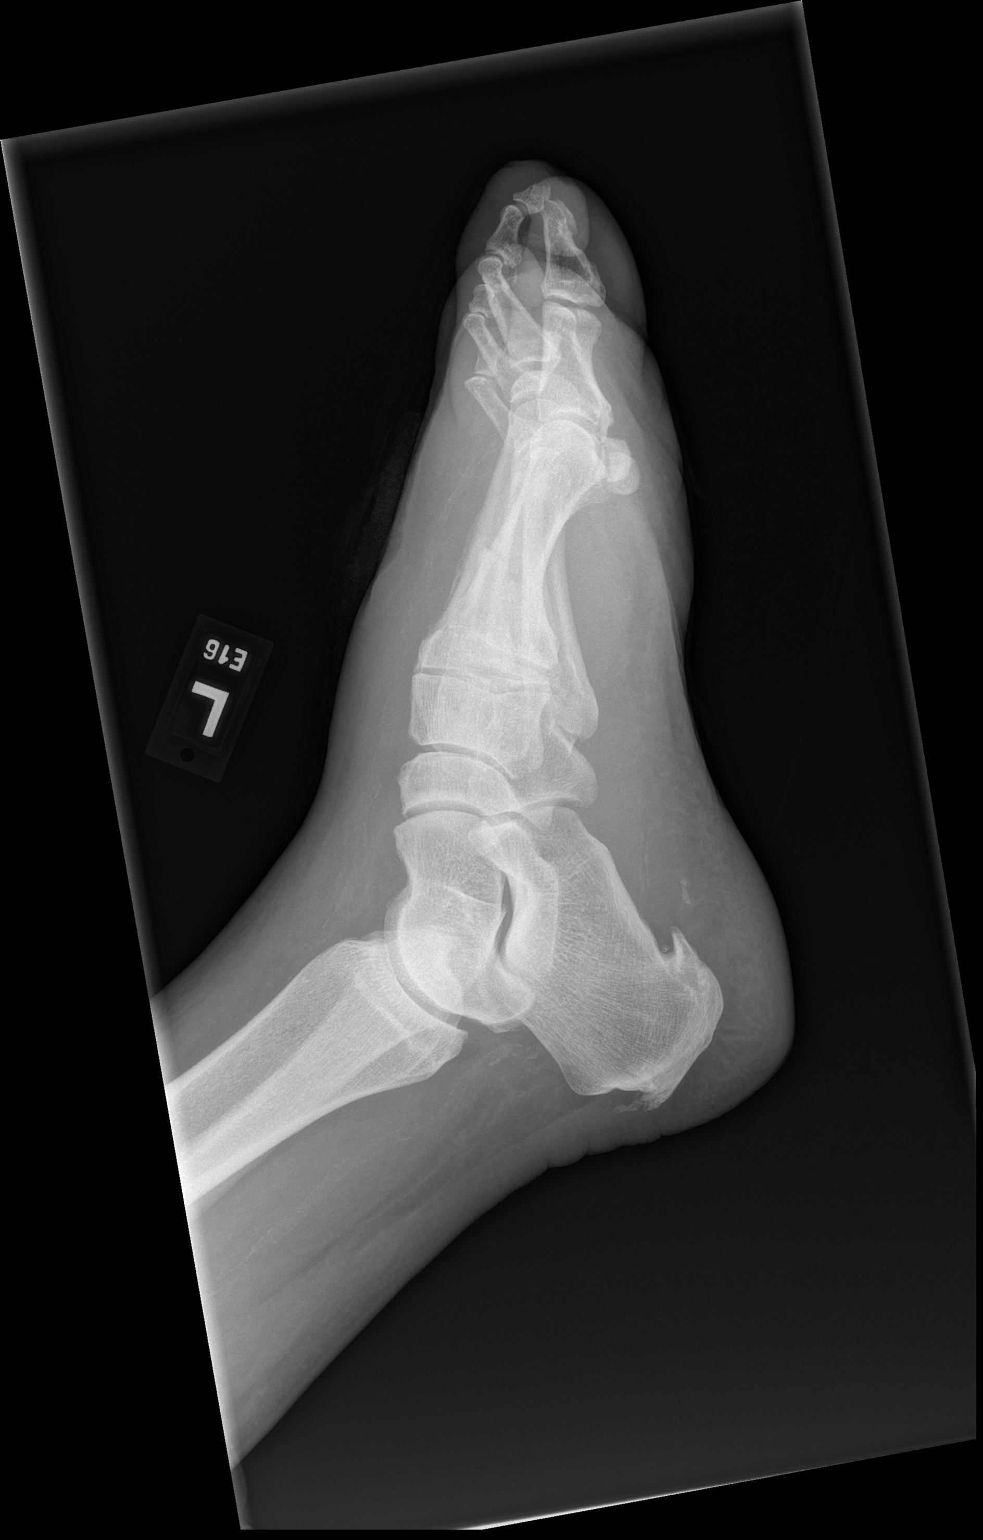

[3 of 3 positions shown; findings below may reference images not displayed]

FINDINGS: Remote amputation of the third digit at the level of the proximal
metatarsal. No acute fracture or dislocation. No osseous
destruction. Moderate diffuse soft tissue swelling about the midfoot
and forefoot. Small Achilles and calcaneal spurs. No soft tissue
gas.
IMPRESSION: No plain film evidence of osteomyelitis. Diffuse soft tissue
swelling.

## 2020-12-24 MED ORDER — SODIUM CHLORIDE 0.9 % IV SOLN
2.0000 g | Freq: Once | INTRAVENOUS | Status: AC
  Administered 2020-12-24: 2 g via INTRAVENOUS
  Filled 2020-12-24: qty 20

## 2020-12-24 MED ORDER — DIPHENHYDRAMINE HCL 25 MG PO CAPS: 25.0000 mg | ORAL_CAPSULE | Freq: Four times a day (QID) | ORAL | Status: DC | PRN

## 2020-12-24 MED ORDER — ACETAMINOPHEN 650 MG RE SUPP: 650.0000 mg | Freq: Four times a day (QID) | RECTAL | Status: DC | PRN

## 2020-12-24 MED ORDER — SODIUM CHLORIDE 0.9 % IV SOLN
2.0000 g | INTRAVENOUS | Status: DC
  Administered 2020-12-25: 2 g via INTRAVENOUS
  Administered 2020-12-26: 4 g via INTRAVENOUS
  Administered 2020-12-27 – 2020-12-29 (×3): 2 g via INTRAVENOUS
  Filled 2020-12-24 (×6): qty 20

## 2020-12-24 MED ORDER — VANCOMYCIN HCL IN DEXTROSE 1-5 GM/200ML-% IV SOLN: 1000.0000 mg | Freq: Once | INTRAVENOUS | Status: DC

## 2020-12-24 MED ORDER — INSULIN ASPART 100 UNIT/ML IJ SOLN
0.0000 [IU] | INTRAMUSCULAR | Status: DC
Start: 2020-12-25 — End: 2020-12-30
  Administered 2020-12-25: 3 [IU] via SUBCUTANEOUS
  Administered 2020-12-25 (×2): 4 [IU] via SUBCUTANEOUS
  Administered 2020-12-25 (×2): 3 [IU] via SUBCUTANEOUS
  Administered 2020-12-25: 4 [IU] via SUBCUTANEOUS
  Administered 2020-12-26 (×3): 3 [IU] via SUBCUTANEOUS
  Administered 2020-12-26 – 2020-12-27 (×7): 4 [IU] via SUBCUTANEOUS
  Administered 2020-12-27: 3 [IU] via SUBCUTANEOUS
  Administered 2020-12-28: 4 [IU] via SUBCUTANEOUS
  Administered 2020-12-28: 7 [IU] via SUBCUTANEOUS
  Administered 2020-12-28: 3 [IU] via SUBCUTANEOUS
  Administered 2020-12-28 – 2020-12-29 (×5): 4 [IU] via SUBCUTANEOUS
  Administered 2020-12-29: 7 [IU] via SUBCUTANEOUS
  Administered 2020-12-29: 3 [IU] via SUBCUTANEOUS
  Administered 2020-12-29 – 2020-12-30 (×3): 4 [IU] via SUBCUTANEOUS
  Administered 2020-12-30: 7 [IU] via SUBCUTANEOUS
  Administered 2020-12-30: 4 [IU] via SUBCUTANEOUS
  Filled 2020-12-24: qty 0.2

## 2020-12-24 MED ORDER — ATORVASTATIN CALCIUM 10 MG PO TABS
20.0000 mg | ORAL_TABLET | Freq: Every day | ORAL | Status: DC
  Administered 2020-12-25 – 2020-12-30 (×6): 20 mg via ORAL
  Filled 2020-12-24 (×6): qty 2

## 2020-12-24 MED ORDER — IBUPROFEN 800 MG PO TABS
800.0000 mg | ORAL_TABLET | Freq: Four times a day (QID) | ORAL | Status: DC | PRN
  Administered 2020-12-27 – 2020-12-29 (×4): 800 mg via ORAL
  Filled 2020-12-24 (×5): qty 1

## 2020-12-24 MED ORDER — VANCOMYCIN HCL 1250 MG/250ML IV SOLN
1250.0000 mg | Freq: Two times a day (BID) | INTRAVENOUS | Status: DC
  Administered 2020-12-25 – 2020-12-29 (×10): 1250 mg via INTRAVENOUS
  Filled 2020-12-24 (×11): qty 250

## 2020-12-24 MED ORDER — SODIUM CHLORIDE 0.9% FLUSH
3.0000 mL | Freq: Two times a day (BID) | INTRAVENOUS | Status: DC
  Administered 2020-12-25 – 2020-12-30 (×12): 3 mL via INTRAVENOUS

## 2020-12-24 MED ORDER — PROMETHAZINE HCL 25 MG PO TABS: 12.5000 mg | ORAL_TABLET | Freq: Four times a day (QID) | ORAL | Status: DC | PRN

## 2020-12-24 MED ORDER — AMLODIPINE BESYLATE 5 MG PO TABS
5.0000 mg | ORAL_TABLET | Freq: Every day | ORAL | Status: DC
  Administered 2020-12-25 – 2020-12-26 (×2): 5 mg via ORAL
  Filled 2020-12-24 (×2): qty 1

## 2020-12-24 MED ORDER — LOSARTAN POTASSIUM 50 MG PO TABS
50.0000 mg | ORAL_TABLET | Freq: Every day | ORAL | Status: DC
  Administered 2020-12-25 – 2020-12-30 (×6): 50 mg via ORAL
  Filled 2020-12-24 (×6): qty 1

## 2020-12-24 MED ORDER — ACETAMINOPHEN 325 MG PO TABS
650.0000 mg | ORAL_TABLET | Freq: Four times a day (QID) | ORAL | Status: DC | PRN
  Administered 2020-12-28: 650 mg via ORAL
  Filled 2020-12-24: qty 2

## 2020-12-24 MED ORDER — METRONIDAZOLE 500 MG PO TABS
500.0000 mg | ORAL_TABLET | Freq: Two times a day (BID) | ORAL | Status: DC
Start: 2020-12-25 — End: 2020-12-30
  Administered 2020-12-25 – 2020-12-29 (×11): 500 mg via ORAL
  Filled 2020-12-24 (×11): qty 1

## 2020-12-24 MED ORDER — METFORMIN HCL ER 500 MG PO TB24
1000.0000 mg | ORAL_TABLET | Freq: Two times a day (BID) | ORAL | Status: DC
  Administered 2020-12-25: 1000 mg via ORAL
  Filled 2020-12-24: qty 2

## 2020-12-24 MED ORDER — HEPARIN SODIUM (PORCINE) 5000 UNIT/ML IJ SOLN
5000.0000 [IU] | Freq: Three times a day (TID) | INTRAMUSCULAR | Status: DC
  Administered 2020-12-25 – 2020-12-30 (×15): 5000 [IU] via SUBCUTANEOUS
  Filled 2020-12-24 (×15): qty 1

## 2020-12-24 MED ORDER — ASPIRIN EC 81 MG PO TBEC
81.0000 mg | DELAYED_RELEASE_TABLET | Freq: Every day | ORAL | Status: DC
  Administered 2020-12-25 – 2020-12-30 (×6): 81 mg via ORAL
  Filled 2020-12-24 (×6): qty 1

## 2020-12-24 MED ORDER — VANCOMYCIN HCL 2000 MG/400ML IV SOLN
2000.0000 mg | Freq: Once | INTRAVENOUS | Status: AC
  Administered 2020-12-24: 2000 mg via INTRAVENOUS
  Filled 2020-12-24: qty 400

## 2020-12-24 NOTE — Progress Notes (Signed)
  Subjective:  Patient ID: Carl [name], male    DOB: [birth year],  MRN: [id number]  No chief complaint on file.  DOS: 09/26/20 Procedure:             1) Third metatarsal partial resection             2) Delayed wound closure  DOS: 09/22/20 Procedure:             1) Left foot incision and drainage of complex abscess             2) 3rd toe amputation left  55 y.o. male presents with the above complaint. History confirmed with patient. States that he thinks the toe is doing better than it was previously.  Objective:  Physical Exam: no tenderness at the surgical site, local edema noted, and calf supple, nontender. Incision: remaining dehiscence 2*1, with granular base. Wound without deep probing. No tunneling or undermining. Ulceration present to the tip of the second toe approximately 0.4 cm in diameter with probe to bone.  Concomitant hammertoe deformity, reduced from prior. Purulence at the tip of the digit  Assessment:   1. Hammer toe of left foot   2. Cellulitis and abscess of toe of left foot   3. Diabetic foot infection (HCC)     Plan:  Patient was evaluated and treated and all questions answered.  Left 2nd toe osteomyelitis -New onset infection to the dorsal foot and 2nd toe - toe more edematous than previous and erythematous with necrosis at the distal tip and probe to bone. The dorsal foot wound is well-appearing. Based upon these findings patient would benefit from IV abx and will likely need 2nd toe partial amputation. I think MRI would be beneficial to confirm OM and to evaluate for residual OM of the 3rd metatarsal area. Advised patient proceed to the ED and we will evaluate further. Advised on-call podiatry Dr. Allena Katz who will see patient on admission.  No follow-ups on file.

## 2020-12-24 NOTE — Progress Notes (Signed)
A consult was received from an ED physician for vancomycin per pharmacy dosing.  The patient's profile has been reviewed for ht/wt/allergies/indication/available labs.    A one time order has been placed for vancomycin 2 g IV.  Further antibiotics/pharmacy consults should be ordered by admitting physician if indicated.                       Thank you, Selinda Eon, PharmD, BCPS Clinical Pharmacist [place] Please utilize Amion for appropriate phone number to reach the unit pharmacist [facility]) 12/24/2020 8:42 PM

## 2020-12-24 NOTE — H&P (Addendum)
History and Physical    Carl [name] EHU:[id number] DOB: 1965-07-04 DOA: 12/24/2020  PCP: Eppie Gibson, MD  Patient coming from: Podiatry office  Chief Complaint: Non healing wound.   HPI: Carl [name] is a 56 y.o. male with medical history significant of uncontrolled dm type 2, obesity, HTN, HLD who presents for foot wound.  He underwent surgery in July to have his middle toe of the left foot amputated due to a non healing wound.  Since that time, he has been following up with podiatry to evaluate the wound.  The wound has never quite healed completely and today when seen he was noted to have developed edema of that ankle/foot, erythema and warmth around the surgical site.  Also with skin changes and swelling of the left 2nd toe.  He notes no pain, but he does feel uncomfortable with the swelling.  He notes that he has gone back to work, even though he was not cleared to do so.  He has been on a course of antibiotics about 3 weeks ago that he completed.  He denies fever, chills, chest pain nausea, vomiting, change in bladder or bowel habits.  He does have a rash on the right ankle and left arm.  He has had the rash on the right ankle for a long time, he thinks it is psoriasis.  He does not remember when the right arm rash occurred.   ED Course: In the ED, he was initiated on broad spectrum Abx with ceftriaxone and vancomycin.  MRI is planned for tomorrow morning.  H/H mildly low, INR normal.   Review of Systems: As per HPI otherwise all other systems reviewed and are negative.  Past Medical History:  Diagnosis Date   Diabetes mellitus without complication ([place])    Hyperlipidemia    Hypertension    Obesity     Past Surgical History:  Procedure Laterality Date   AMPUTATION TOE Left 09/22/2020   HAND SURGERY     INCISION AND DRAINAGE Left 09/22/2020   Procedure: INCISION AND DRAINAGE LEFT FOOT WITH REMOVAL OF ALL NON VIABLE SOFT TISSUE AND BONE INCLUDING THRID TOE AMPUTATION;  Surgeon:  Evelina Bucy, DPM;  Location: [facility];  Service: Podiatry;  Laterality: Left;   VASECTOMY     WOUND DEBRIDEMENT Left 09/26/2020   Procedure: DEBRIDEMENT WOUND LEFT FOOT;  Surgeon: Evelina Bucy, DPM;  Location: [facility];  Service: Podiatry;  Laterality: Left;    Social History  reports that he quit smoking about 36 years ago. His smoking use included cigarettes. He has a 1.00 pack-year smoking history. He has never used smokeless tobacco. He reports current alcohol use. He reports that he does not use drugs.  Allergies  Allergen Reactions   Morphine And Related Other (See Comments)    hallucinations    Family History  Problem Relation Age of Onset   Diabetes Mother    Hypertension Mother    Hypertension Father     Prior to Admission medications   Medication Sig Start Date End Date Taking? Authorizing Provider  acetaminophen (TYLENOL) 325 MG tablet Take 2 tablets (650 mg total) by mouth every 6 (six) hours as needed for mild pain or fever. 09/28/20  Yes Ezequiel Essex, MD  Apple Cider Vinegar 500 MG TABS Take 1,000 mg by mouth 2 (two) times daily.   Yes [provider]  aspirin EC 81 MG tablet Take 1 tablet (81 mg total) by mouth daily. Swallow whole. 10/27/20  Yes  Eppie Gibson, MD  atorvastatin (LIPITOR) 20 MG tablet Take 1 tablet (20 mg total) by mouth daily. 10/27/20  Yes Eppie Gibson, MD  diphenhydrAMINE (BENADRYL) 25 MG tablet Take 25-50 mg by mouth every 6 (six) hours as needed for sleep.   Yes [provider]  guaiFENesin (MUCINEX) 600 MG 12 hr tablet Take 600 mg by mouth 2 (two) times daily as needed for cough.   Yes [provider]  ibuprofen (ADVIL) 200 MG tablet Take 800 mg by mouth every 6 (six) hours as needed for mild pain.   Yes [provider]  losartan (COZAAR) 50 MG tablet Take 1 tablet (50 mg total) by mouth daily. 10/27/20  Yes Eppie Gibson, MD  metFORMIN (GLUCOPHAGE-XR) 500 MG 24 hr tablet Take 2 tablets (1,000 mg  total) by mouth 2 (two) times daily with a meal. 10/27/20  Yes Eppie Gibson, MD  Omega-3 Fatty Acids (FISH OIL) 500 MG CAPS Take 500 mg by mouth every evening.   Yes [provider]  Turmeric 500 MG TABS Take 500 mg by mouth 2 (two) times daily.   Yes [provider]  blood glucose meter kit and supplies KIT Dispense based on patient and insurance preference. Use up to four times daily as directed. (FOR ICD-9 250.00, 250.01). 01/12/15   Davonna Belling, MD                         Physical Exam: Vitals:   12/24/20 1813 12/24/20 2143  BP: (!) 224/111 (!) 213/93  Pulse: 84 72  Resp: 16 18  Temp: 98 F (36.7 C)   TempSrc: Oral   SpO2: 100% 98%  Weight: 113.4 kg   Height: 5' 10"  (1.778 m)     Constitutional: NAD, calm, comfortable, lying in bed Eyes:lids and conjunctivae normal ENMT: Mucous membranes are mildly dry.  Neck: normal, supple Respiratory: clear to auscultation bilaterally, no wheezing, no crackles. Normal respiratory effort.  Cardiovascular: RR, NR, no murmur noted.  He has peripheral edema on the left ankle related to infection.  No other edema.  Abdomen: obese, NT, ND, +BS Musculoskeletal: He is missing the 3rd digit on the left foot.  He has a necrotic area on the tip of the 2nd toe on the left.  Skin: He has a blanching red rash on right arm associated with some healing wounds and a erythematous rash on the right ankle which has a pearly white sheen overlying.  He has erythema to the forefoot on the left foot, open dime sized wound on dorsal surface at the middle of the foot, he has necrotic distal toe which is swollen and red.  Warm to the touch throughout.  Pulses are palpable at the PT on the left.  He has plethoric facies, very red cheeks which he notes is chronic.  Neurologic: Sensation is decreased in the left foot, but he reports being able to feel light touch.  Otherwise grossly intact.  Psychiatric: Normal judgment and insight. Alert and  oriented x 3. Normal mood.   Labs on Admission: I have personally reviewed following labs and imaging studies  CBC: Recent Labs  Lab 12/24/20 2118  WBC 7.4  NEUTROABS 4.6  HGB 12.3*  HCT 37.7*  MCV 84.0  PLT 921    Basic Metabolic Panel: Recent Labs  Lab 12/24/20 2118  NA 136  K 3.8  CL 102  CO2 25  GLUCOSE 123*  BUN 15  CREATININE  0.46*  CALCIUM 8.9    GFR: Estimated Creatinine Clearance: 130.1 mL/min (A) (by C-G formula based on SCr of 0.46 mg/dL (L)).  Liver Function Tests: Recent Labs  Lab 12/24/20 2118  AST 21  ALT 25  ALKPHOS 74  BILITOT <0.1*  PROT 7.7  ALBUMIN 4.1    Urine analysis:    Component Value Date/Time   COLORURINE STRAW (A) 12/24/2020 2028   APPEARANCEUR CLEAR 12/24/2020 2028   LABSPEC 1.010 12/24/2020 2028   PHURINE 5.0 12/24/2020 2028   GLUCOSEU NEGATIVE 12/24/2020 2028   HGBUR NEGATIVE 12/24/2020 2028   BILIRUBINUR NEGATIVE 12/24/2020 2028   BILIRUBINUR neg 03/21/2013 1003   KETONESUR NEGATIVE 12/24/2020 2028   PROTEINUR NEGATIVE 12/24/2020 2028   UROBILINOGEN 0.2 08/17/2015 1548   NITRITE NEGATIVE 12/24/2020 2028   LEUKOCYTESUR NEGATIVE 12/24/2020 2028    Radiological Exams on Admission: DG Foot Complete Left  Result Date: 12/24/2020 CLINICAL DATA:  Diabetes.  Prior third toe amputation.  Fever. EXAM: LEFT FOOT - COMPLETE 3+ VIEW COMPARISON:  10/29/2020, without report. FINDINGS: Remote amputation of the third digit at the level of the proximal metatarsal. No acute fracture or dislocation. No osseous destruction. Moderate diffuse soft tissue swelling about the midfoot and forefoot. Small Achilles and calcaneal spurs. No soft tissue gas. IMPRESSION: No plain film evidence of osteomyelitis. Diffuse soft tissue swelling. Electronically Signed   By: Abigail Miyamoto M.D.   On: 12/24/2020 19:25    EKG: Independently reviewed. NSR, no TWI or ST changes.   Assessment/Plan Diabetic foot infection - Post operative infection from  surgery in July + new infection of the 2nd toe - MRI of the foot ordered - Continue vancomycin/rocephin, add metronidazole - Currently hemodynamically stable with elevated blood pressure - Podiatry, Dr. Posey Pronto, will see in the AM - BC X 2 pending - Enterobacter seen on wound culture 8/19 - sensitive to rocephin  Type 2 diabetes mellitus, uncontrolled - Unclear why he is not on a different regimen, reports only being on metformin - SSI, resistant - continue metformin - Consider starting GLP-1 or SGLT2 if patient amenable - Due for A1C, ordered  BMI 40.0-44.9, adult - Consider nutrition consult tomorrow    Hyperlipidemia - Continue statin    Hypertension - Continue losartan - Will add amlodipine to help with BP control  Normocytic mild anemia - Monitor, no blood loss apparent - Follow up outpatient.        DVT prophylaxis: Heparin SQ  Code Status:   Full  Family Communication:  None at bedside  Disposition Plan:   Patient is from:  Home  Anticipated DC to:  Home  Anticipated DC date:  12/29/20  Anticipated DC barriers: Surgery, ambulation post op  Consults called:  Dr. Posey Pronto, Podiatry (called by EDP)  Admission status:  IP, telemetry   Severity of Illness: The appropriate patient status for this patient is INPATIENT. Inpatient status is judged to be reasonable and necessary in order to provide the required intensity of service to ensure the patient's safety. The patient's presenting symptoms, physical exam findings, and initial radiographic and laboratory data in the context of their chronic comorbidities is felt to place them at high risk for further clinical deterioration. Furthermore, it is not anticipated that the patient will be medically stable for discharge from the hospital within 2 midnights of admission.   * I certify that at the point of admission it is my clinical judgment that the patient will require inpatient hospital care spanning beyond 2 midnights  from the  point of admission due to high intensity of service, high risk for further deterioration and high frequency of surveillance required.Gilles Chiquito MD [facility]  How to contact the [facility] Attending or Consulting provider [place] or covering provider during after hours [place], for this patient?   Check the care team in [facility] and look for a) attending/consulting [facility] provider listed and b) the [facility] team listed Log into [email or web address] and use [place]'s universal password to access. If you do not have the password, please contact the hospital operator. Locate the [name] provider you are looking for under [facility] and page to a number that you can be directly reached. If you still have difficulty reaching the provider, please page the [facility] (Director on Call) for the Hospitalists listed on amion for assistance.  12/24/2020, 11:08 PM

## 2020-12-24 NOTE — ED Provider Notes (Signed)
[facility] DEPT Provider Note   CSN: [id number] Arrival date & time: 12/24/20  1803     History Chief Complaint  Patient presents with   Toe Pain    Carl [name] is a 55 y.o. male.  HPI 55 year old male presents to the ED after being sent from podiatrist's office. He has had a worsening left foot that Dr. March Rummage is now concerned needs surgery and/or amputation. Has had a couple procedures in office but now has worsening presentation. No fevers. Chronic neuropathy. Sent here for MRI, admission, and Dr. Posey Pronto will take care of surgery.  Past Medical History:  Diagnosis Date   Diabetes mellitus without complication ([place])    Hypertension     Patient Active Problem List   Diagnosis Date Noted   Healthcare maintenance 10/27/2020   Planned postoperative wound closure    Cellulitis and abscess of toe of left foot    Diabetic foot infection ([place])    Abscess of left foot    Osteomyelitis ([place]) 09/21/2020   Right-sided Bell's palsy 01/20/2015   Type 2 diabetes mellitus without complications ([place]) 22/97/9892   BMI 40.0-44.9, adult ([place]) 06/12/2011   Hyperlipidemia 06/12/2011   Hypertension 06/12/2011    Past Surgical History:  Procedure Laterality Date   HAND SURGERY     INCISION AND DRAINAGE Left 09/22/2020   Procedure: INCISION AND DRAINAGE LEFT FOOT WITH REMOVAL OF ALL NON VIABLE SOFT TISSUE AND BONE INCLUDING THRID TOE AMPUTATION;  Surgeon: Evelina Bucy, DPM;  Location: [facility];  Service: Podiatry;  Laterality: Left;   VASECTOMY     WOUND DEBRIDEMENT Left 09/26/2020   Procedure: DEBRIDEMENT WOUND LEFT FOOT;  Surgeon: Evelina Bucy, DPM;  Location: [facility];  Service: Podiatry;  Laterality: Left;       Family History  Problem Relation Age of Onset   Diabetes Mother    Hypertension Mother    Hypertension Father     Social History   Tobacco Use   Smoking status: Former    Packs/day: 0.50    Years: 2.00    Pack years: 1.00    Types:  Cigarettes    Quit date: 10/05/1984    Years since quitting: 36.2   Smokeless tobacco: Never  Vaping Use   Vaping Use: Never used  Substance Use Topics   Alcohol use: Yes    Comment: occ   Drug use: No    Home Medications Prior to Admission medications   Medication Sig Start Date End Date Taking? Authorizing Provider  acetaminophen (TYLENOL) 325 MG tablet Take 2 tablets (650 mg total) by mouth every 6 (six) hours as needed for mild pain or fever. 09/28/20  Yes Ezequiel Essex, MD  Apple Cider Vinegar 500 MG TABS Take 1,000 mg by mouth 2 (two) times daily.   Yes [provider]  aspirin EC 81 MG tablet Take 1 tablet (81 mg total) by mouth daily. Swallow whole. 10/27/20  Yes Eppie Gibson, MD  atorvastatin (LIPITOR) 20 MG tablet Take 1 tablet (20 mg total) by mouth daily. 10/27/20  Yes Eppie Gibson, MD  diphenhydrAMINE (BENADRYL) 25 MG tablet Take 25-50 mg by mouth every 6 (six) hours as needed for sleep.   Yes [provider]  guaiFENesin (MUCINEX) 600 MG 12 hr tablet Take 600 mg by mouth 2 (two) times daily as needed for cough.   Yes [provider]  ibuprofen (ADVIL) 200 MG tablet Take 800 mg by mouth every 6 (  six) hours as needed for mild pain.   Yes [provider]  losartan (COZAAR) 50 MG tablet Take 1 tablet (50 mg total) by mouth daily. 10/27/20  Yes Eppie Gibson, MD  metFORMIN (GLUCOPHAGE-XR) 500 MG 24 hr tablet Take 2 tablets (1,000 mg total) by mouth 2 (two) times daily with a meal. 10/27/20  Yes Eppie Gibson, MD  Omega-3 Fatty Acids (FISH OIL) 500 MG CAPS Take 500 mg by mouth every evening.   Yes [provider]  Turmeric 500 MG TABS Take 500 mg by mouth 2 (two) times daily.   Yes [provider]  blood glucose meter kit and supplies KIT Dispense based on patient and insurance preference. Use up to four times daily as directed. (FOR ICD-9 250.00, 250.01). 01/12/15   Davonna Belling, MD  doxycycline (VIBRA-TABS) 100  MG tablet Take 1 tablet (100 mg total) by mouth 2 (two) times daily. Patient not taking: No sig reported 10/29/20   Evelina Bucy, DPM  glipiZIDE (GLUCOTROL) 5 MG tablet Take 0.5 tablets (2.5 mg total) by mouth daily before breakfast. Patient not taking: No sig reported 10/27/20   Eppie Gibson, MD  levofloxacin (LEVAQUIN) 250 MG tablet Take 1 tablet (250 mg total) by mouth daily. Patient not taking: No sig reported 11/12/20   Evelina Bucy, DPM    Allergies    Morphine and related  Review of Systems   Review of Systems  Constitutional:  Negative for fever.  Musculoskeletal:  Positive for joint swelling.  Skin:  Positive for color change and wound.  All other systems reviewed and are negative.  Physical Exam Updated Vital Signs BP (!) 213/93 (BP Location: Left Arm)   Pulse 72   Temp 98 F (36.7 C) (Oral)   Resp 18   Ht 5' 10"  (1.778 m)   Wt 113.4 kg   SpO2 98%   BMI 35.87 kg/m   Physical Exam Vitals and nursing note reviewed.  Constitutional:      General: He is not in acute distress.    Appearance: He is well-developed. He is obese. He is not ill-appearing or diaphoretic.  HENT:     Head: Normocephalic and atraumatic.     Right Ear: External ear normal.     Left Ear: External ear normal.     Nose: Nose normal.  Eyes:     General:        Right eye: No discharge.        Left eye: No discharge.  Cardiovascular:     Rate and Rhythm: Normal rate and regular rhythm.     Pulses:          Dorsalis pedis pulses are 2+ on the left side.  Pulmonary:     Effort: Pulmonary effort is normal.  Abdominal:     General: There is no distension.  Musculoskeletal:     Comments: See picture.  Left foot is diffusely swollen and mildly warm.  There is no active drainage.  No tenderness.  Skin:    General: Skin is warm and dry.     Findings: Erythema present.  Neurological:     Mental Status: He is alert.  Psychiatric:        Mood and Affect: Mood is not anxious.        ED Results / Procedures / Treatments   Labs (all labs ordered are listed, but only abnormal results are displayed) Labs Reviewed  COMPREHENSIVE METABOLIC PANEL - Abnormal; Notable for  the following components:      Result Value   Glucose, Bld 123 (*)    Creatinine, Ser 0.46 (*)    Total Bilirubin <0.1 (*)    All other components within normal limits  CBC WITH DIFFERENTIAL/PLATELET - Abnormal; Notable for the following components:   Hemoglobin 12.3 (*)    HCT 37.7 (*)    All other components within normal limits  URINALYSIS, ROUTINE W REFLEX MICROSCOPIC - Abnormal; Notable for the following components:   Color, Urine STRAW (*)    All other components within normal limits  RESP PANEL BY RT-PCR (FLU A&B, COVID) ARPGX2  CULTURE, BLOOD (ROUTINE X 2)  CULTURE, BLOOD (ROUTINE X 2)  LACTIC ACID, PLASMA  PROTIME-INR  APTT  LACTIC ACID, PLASMA    EKG EKG Interpretation  Date/Time:  Friday December 24 2020 20:19:18 EDT Ventricular Rate:  76 PR Interval:  198 QRS Duration: 94 QT Interval:  385 QTC Calculation: 433 R Axis:   -18 Text Interpretation: Sinus rhythm Borderline left axis deviation Confirmed by Sherwood Gambler [phone]) on 12/24/2020 10:30:49 PM  Radiology DG Foot Complete Left  Result Date: 12/24/2020 CLINICAL DATA:  Diabetes.  Prior third toe amputation.  Fever. EXAM: LEFT FOOT - COMPLETE 3+ VIEW COMPARISON:  10/29/2020, without report. FINDINGS: Remote amputation of the third digit at the level of the proximal metatarsal. No acute fracture or dislocation. No osseous destruction. Moderate diffuse soft tissue swelling about the midfoot and forefoot. Small Achilles and calcaneal spurs. No soft tissue gas. IMPRESSION: No plain film evidence of osteomyelitis. Diffuse soft tissue swelling. Electronically Signed   By: Abigail Miyamoto M.D.   On: 12/24/2020 19:25    Procedures Procedures   Medications Ordered in ED Medications  vancomycin (VANCOREADY) IVPB 2000 mg/400  mL (2,000 mg Intravenous New Bag/Given 12/24/20 2216)  cefTRIAXone (ROCEPHIN) 2 g in sodium chloride 0.9 % 100 mL IVPB (2 g Intravenous New Bag/Given 12/24/20 2142)    ED Course  I have reviewed the triage vital signs and the nursing notes.  Pertinent labs & imaging results that were available during my care of the patient were reviewed by me and considered in my medical decision making (see chart for details).    MDM Rules/Calculators/A&P                           I discussed with Dr. Posey Pronto, podiatry, who will see patient tomorrow.  Likely an operation on Monday, 10/17.  Otherwise the patient is not septic.  He will be given IV antibiotics.  Discussed with Dr. Daryll Drown for admission.  He will need MRI while inpatient. Final Clinical Impression(s) / ED Diagnoses Final diagnoses:  Left foot infection    Rx / DC Orders ED Discharge Orders     None        Sherwood Gambler, MD 12/24/20 2241

## 2020-12-24 NOTE — ED Provider Notes (Signed)
Emergency Medicine Provider Triage Evaluation Note  Carl [name] , a 55 y.o. male  was evaluated in triage.  Pt complains of left-sided second toe deformity and drainage.  Patient was sent from podiatry to rule out osteomyelitis of second left toe.  No fevers or chills.  Review of Systems  Positive: Left foot pain, warmth and infection Negative: Fevers, chills  Physical Exam  BP (!) 224/111 (BP Location: Right Arm)   Pulse 84   Temp 98 F (36.7 C) (Oral)   Resp 16   Ht 5\' 10"  (1.778 m)   Wt 113.4 kg   SpO2 100%   BMI 35.87 kg/m  Gen:   Awake, no distress   Resp:  Normal effort  MSK:   Moves extremities without difficulty  Other:  Patient with amputation of left middle toe.  Does not appear infected.  Large amounts of inflammation noted to right second toe.  Warm dorsally.  Also noted to plantar surface of second toe.  Small amounts of bruising.  Medical Decision Making  Medically screening exam initiated at 6:21 PM.  Appropriate orders placed.  [name] was informed that the remainder of the evaluation will be completed by another provider, this initial triage assessment does not replace that evaluation, and the importance of remaining in the ED until their evaluation is complete.  Per podiatry: Left 2nd toe osteomyelitis -New onset infection to the dorsal foot and 2nd toe - toe more edematous than previous and erythematous with necrosis at the distal tip and probe to bone. The dorsal foot wound is well-appearing. Based upon these findings patient would benefit from IV abx and will likely need 2nd toe partial amputation. I think MRI would be beneficial to confirm OM and to evaluate for residual OM of the 3rd metatarsal area. Advised patient proceed to the ED and we will evaluate further. Advised on-call podiatry Dr. [name] who will see patient on admission.  No recent x-rays in our system.  I ordered a DG left foot.  Can proceed with MRI if needed.   Allena Katz,  PA-C 12/24/20 1829    12/26/20, MD 12/27/20 1501

## 2020-12-24 NOTE — ED Triage Notes (Signed)
Pt states that he was sent by Dr Samuella Cota for amputation of his left foot 2nd toe. Pt states that he had his middle toe removed in July, and the 2nd toe has continues to curl. Pt states his foot has been warm. Pt appropriately anxious in triage

## 2020-12-24 NOTE — Progress Notes (Signed)
Pharmacy Antibiotic Note  Carl [name] is a 55 y.o. male admitted on 12/24/2020 with non-healing  diabetic foot wound  that is worsening.  Pharmacy has been consulted for Vancomycin dosing.  No evidence of osteomyelitis on left foot xray.  MRI pending.  Renal fxn at baseline Polymicrobial cx data since July has been sensitive to ceftriaxone.   Plan: Vancomycin 1250mg  IV q12h to target AUC 400-550 Check Vancomycin level at steady state Monitor renal function and cx data   Height: 5\' 10"  (177.8 cm) Weight: 113.4 kg (250 lb) IBW/kg (Calculated) : 73  Temp (24hrs), Avg:98 F (36.7 C), Min:98 F (36.7 C), Max:98 F (36.7 C)  Recent Labs  Lab 12/24/20 2118  WBC 7.4  CREATININE 0.46*  LATICACIDVEN 1.0    Estimated Creatinine Clearance: 130.1 mL/min (A) (by C-G formula based on SCr of 0.46 mg/dL (L)).    Allergies  Allergen Reactions   Morphine And Related Other (See Comments)    hallucinations    Antimicrobials this admission: 10/14 Vancomycin >>  10/14 Rocephin >>  10/14 Metronidazole>>  Dose adjustments this admission:  Microbiology results: 10/14 BCx:   Previous data: 8/19 Wound Cx: Enterobacter cloacae- sens CTX 7/17 Wound Cx: S. Anginosis- sens CTX 7/17 L foot abscess cx: S. Anginosis + Proteus penneri- sens CTX  Thank you for allowing pharmacy to be a part of this patient's care.  8/17 PharmD 12/24/2020 11:40 PM

## 2020-12-25 ENCOUNTER — Inpatient Hospital Stay (HOSPITAL_COMMUNITY): Payer: Self-pay

## 2020-12-25 DIAGNOSIS — E11621 Type 2 diabetes mellitus with foot ulcer: Secondary | ICD-10-CM

## 2020-12-25 DIAGNOSIS — L97509 Non-pressure chronic ulcer of other part of unspecified foot with unspecified severity: Secondary | ICD-10-CM

## 2020-12-25 DIAGNOSIS — E785 Hyperlipidemia, unspecified: Secondary | ICD-10-CM

## 2020-12-25 LAB — BASIC METABOLIC PANEL
Anion gap: 10 (ref 5–15)
BUN: 12 mg/dL (ref 6–20)
CO2: 25 mmol/L (ref 22–32)
Calcium: 8.6 mg/dL — ABNORMAL LOW (ref 8.9–10.3)
Chloride: 102 mmol/L (ref 98–111)
Creatinine, Ser: 0.48 mg/dL — ABNORMAL LOW (ref 0.61–1.24)
GFR, Estimated: >60 mL/min (ref >60)
Glucose, Bld: 156 mg/dL — ABNORMAL HIGH (ref 70–99)
Potassium: 3.8 mmol/L (ref 3.5–5.1)
Sodium: 137 mmol/L (ref 135–145)

## 2020-12-25 LAB — CBC
HCT: 37.4 % — ABNORMAL LOW (ref 39.0–52.0)
Hemoglobin: 12.1 g/dL — ABNORMAL LOW (ref 13.0–17.0)
MCH: 27.6 pg (ref 26.0–34.0)
MCHC: 32.4 g/dL (ref 30.0–36.0)
MCV: 85.4 fL (ref 80.0–100.0)
Platelets: 190 10*3/uL (ref 150–400)
RBC: 4.38 MIL/uL (ref 4.22–5.81)
RDW: 14.6 % (ref 11.5–15.5)
WBC: 7.1 10*3/uL (ref 4.0–10.5)
nRBC: 0 % (ref 0.0–0.2)

## 2020-12-25 LAB — GLUCOSE, CAPILLARY
Glucose-Capillary: 139 mg/dL — ABNORMAL HIGH (ref 70–99)
Glucose-Capillary: 146 mg/dL — ABNORMAL HIGH (ref 70–99)
Glucose-Capillary: 156 mg/dL — ABNORMAL HIGH (ref 70–99)
Glucose-Capillary: 157 mg/dL — ABNORMAL HIGH (ref 70–99)
Glucose-Capillary: 157 mg/dL — ABNORMAL HIGH (ref 70–99)
Glucose-Capillary: 159 mg/dL — ABNORMAL HIGH (ref 70–99)
Glucose-Capillary: 176 mg/dL — ABNORMAL HIGH (ref 70–99)

## 2020-12-25 LAB — PREALBUMIN: Prealbumin: 19.4 mg/dL (ref 18–38)

## 2020-12-25 LAB — SEDIMENTATION RATE: Sed Rate: 28 mm/hr — ABNORMAL HIGH (ref 0–16)

## 2020-12-25 LAB — C-REACTIVE PROTEIN: CRP: 0.9 mg/dL (ref <1.0)

## 2020-12-25 LAB — HEMOGLOBIN A1C
Hgb A1c MFr Bld: 7.2 % — ABNORMAL HIGH (ref 4.8–5.6)
Mean Plasma Glucose: 159.94 mg/dL

## 2020-12-25 IMAGING — MR MR FOOT*L* WO/W CM
9 series · 39 of 40 positions shown · IV contrast (gadavist)
Comparison: X-ray [DATE]

CLINICAL DATA: Foot swelling, diabetic, osteomyelitis suspected,
xray done

EXAM:
MRI OF THE LEFT FOREFOOT WITHOUT AND WITH CONTRAST
TECHNIQUE: Multiplanar, multisequence MR imaging of the left forefoot was
performed both before and after administration of intravenous
contrast.
CONTRAST:  10mL GADAVIST GADOBUTROL 1 MMOL/ML IV SOLN

[Series 3: T1 · coronal · left · 3.0mm · 0.47mm/px · 6 of 40 slices shown (1 of 2)]
[im 1/40]
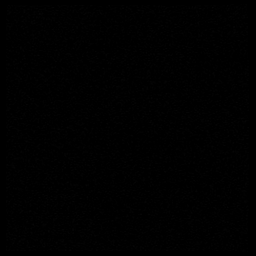
[im 8/40]
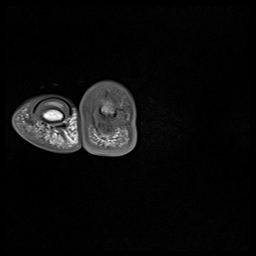
[im 16/40]
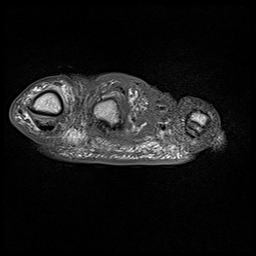
[im 24/40]
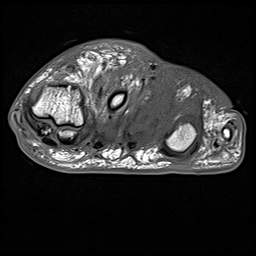
[im 32/40]
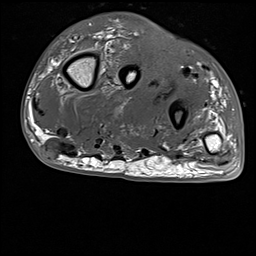
[im 40/40]
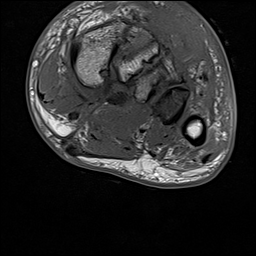

[Series 4: T2 fat-sat · coronal · left · 3.0mm · 0.38mm/px · 6 of 40 slices shown (1 of 2)]
[im 1/40]
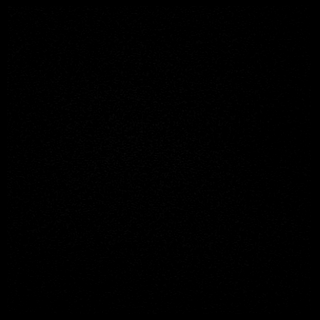
[im 8/40]
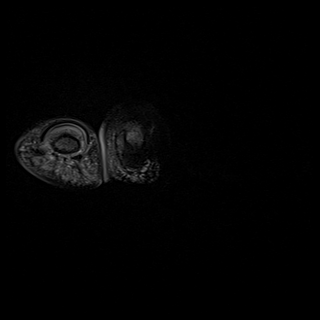
[im 16/40]
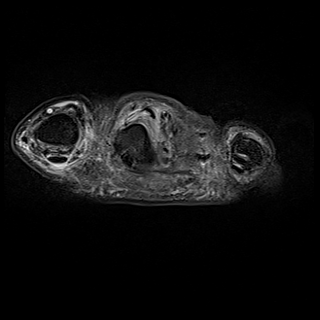
[im 24/40]
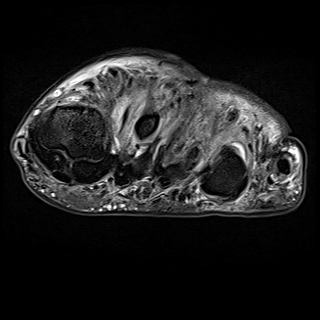
[im 32/40]
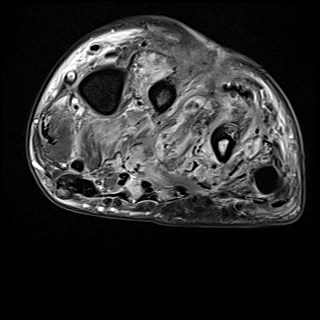
[im 40/40]
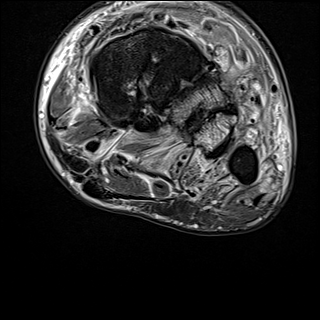

[Series 5: T2 fat-sat · axial · left · 3.0mm · 0.70mm/px · z∈[-38,+44]mm · 4 of 25 slices shown (2 of 2)]
[im 1/25]
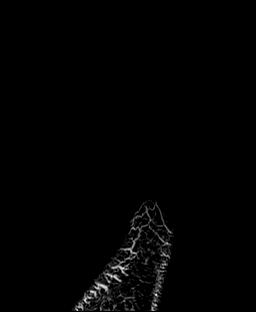
[im 9/25]
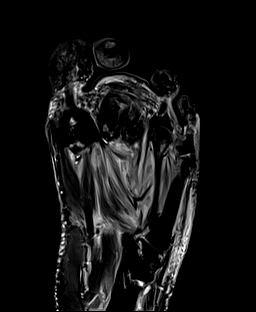
[im 17/25]
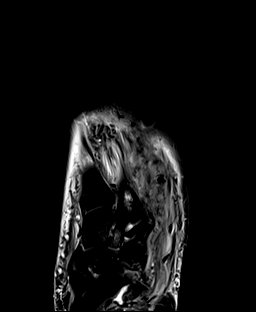
[im 25/25]
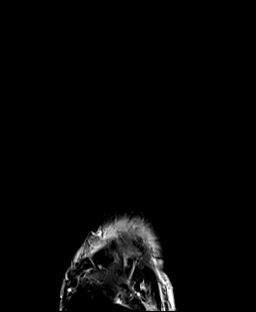

[Series 6: T1 · axial · left · 3.0mm · 0.70mm/px · z∈[-38,+44]mm · 3 of 25 slices shown (2 of 2)]
[im 1/25]
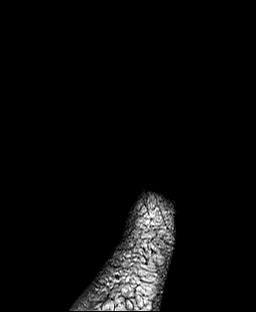
[im 13/25]
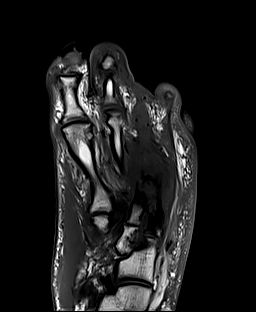
[im 25/25]
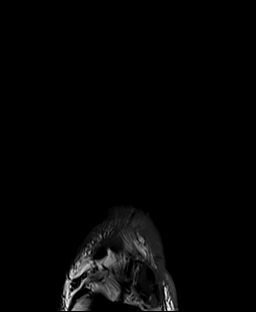

[Series 7: STIR · sagittal · left · 3.0mm · 0.35mm/px · 3 of 32 slices shown]
[im 1/32]
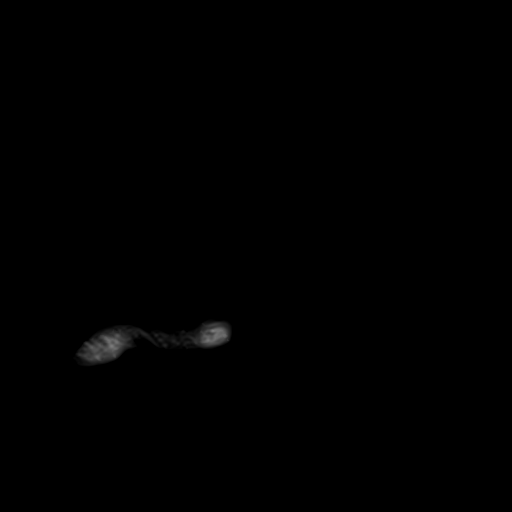
[im 11/32]
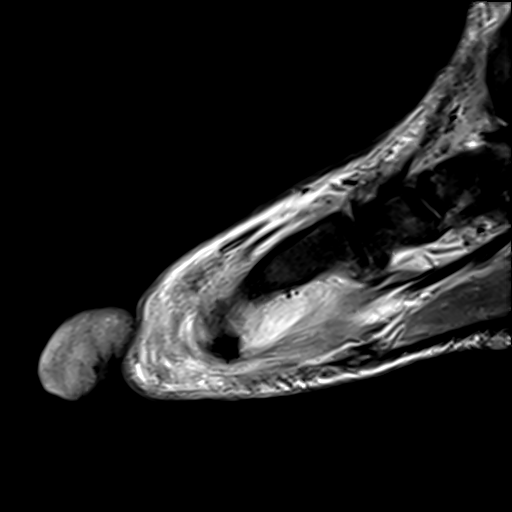
[im 21/32]
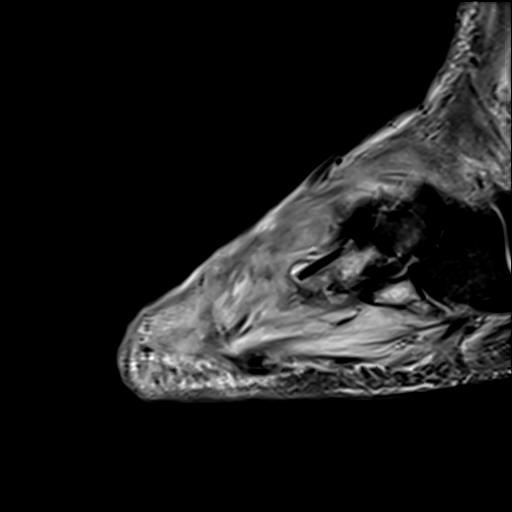

[Series 8: T1 fat-sat · coronal · non-contrast · left · 3.0mm · 0.47mm/px · 5 of 40 slices shown]
[im 1/40]
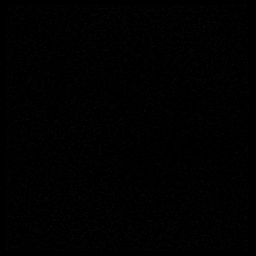
[im 10/40]
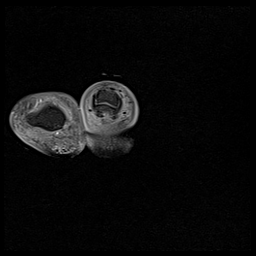
[im 20/40]
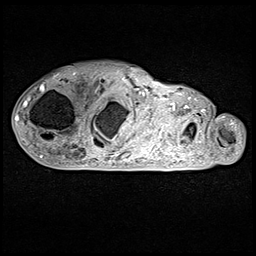
[im 30/40]
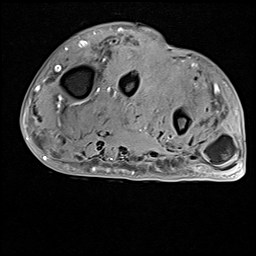
[im 40/40]
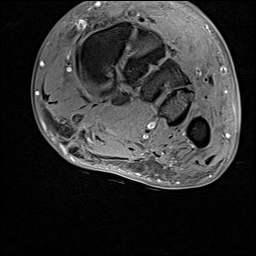

[Series 9: T1 fat-sat post-contrast · coronal · left · 3.0mm · 0.47mm/px · 5 of 40 slices shown (1 of 3)]
[im 1/40]
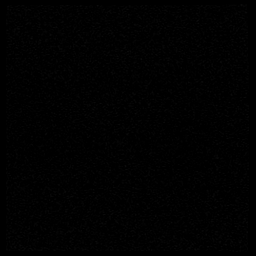
[im 10/40]
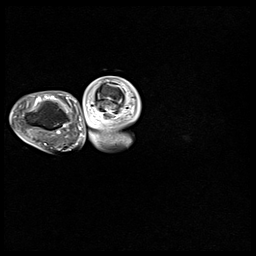
[im 20/40]
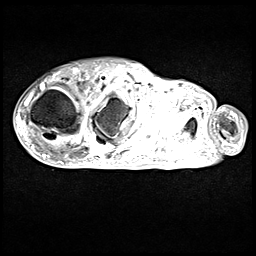
[im 30/40]
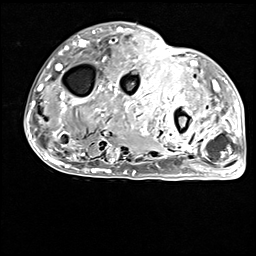
[im 40/40]
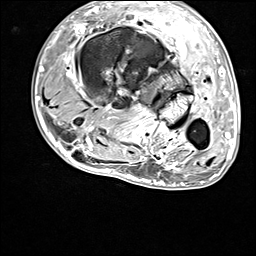

[Series 10: T1 fat-sat post-contrast · sagittal · left · 3.0mm · 0.35mm/px · 4 of 32 slices shown (2 of 3)]
[im 1/32]
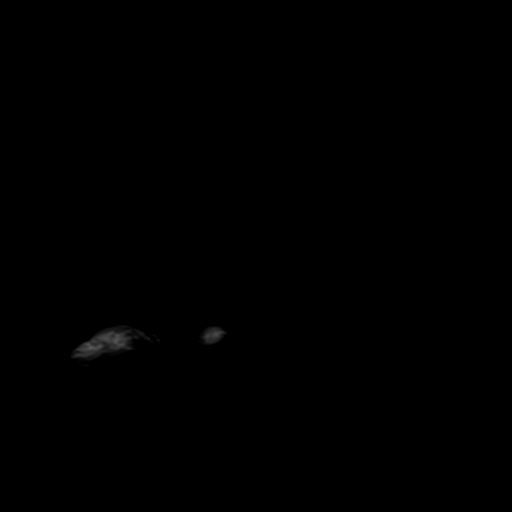
[im 11/32]
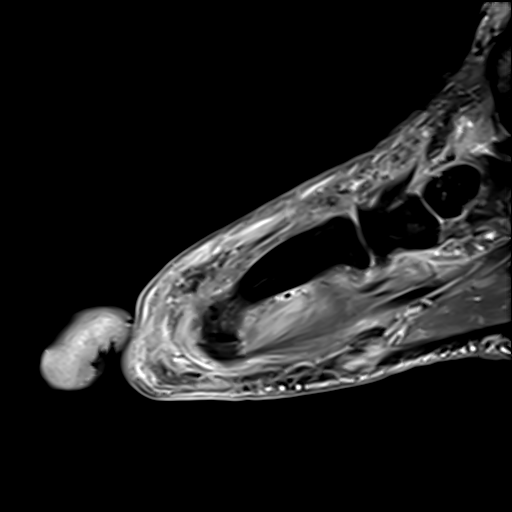
[im 21/32]
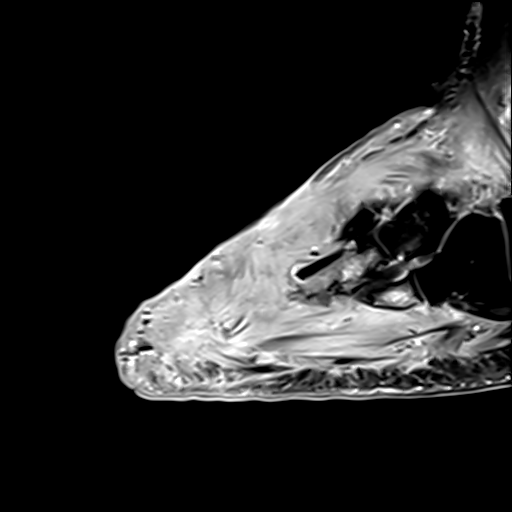
[im 32/32]
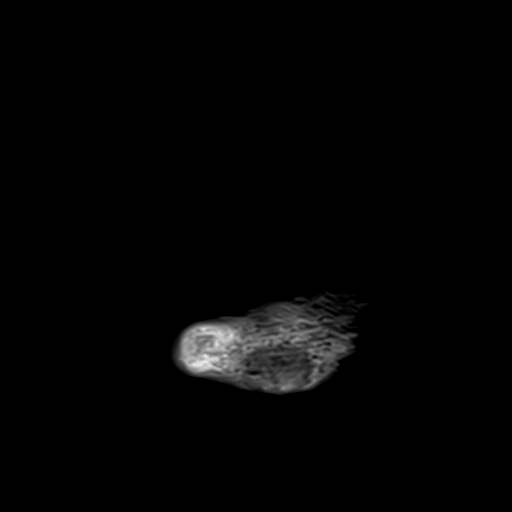

[Series 11: T1 fat-sat post-contrast · axial · left · 3.0mm · 0.56mm/px · z∈[-34,+49]mm · 3 of 25 slices shown (3 of 3)]
[im 1/25]
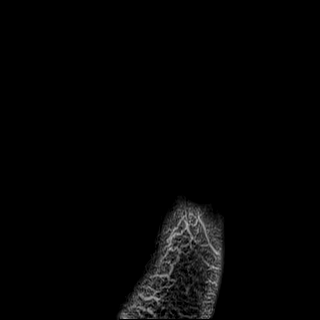
[im 13/25]
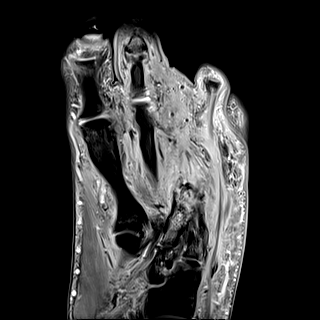
[im 25/25]
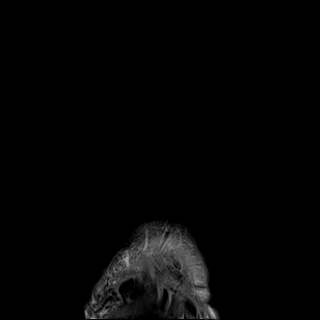

[39 of 40 positions shown; findings below may reference images not displayed]

FINDINGS: Bones/Joint/Cartilage

Patient is status post amputation of the third ray at the level of
the proximal metatarsal diaphysis. There is marked bone marrow edema
with confluent low T1 marrow signal and fragmentation of the
residual third metatarsal extending nearly to the level of the
articular surface (series 6, images 11-12). Findings are compatible
with acute osteomyelitis.

Flexion deformity of the second toe. There is destruction of the
distal phalanx of the second toe. Prominent bone marrow edema within
the middle phalanx of the second toe with intermediate signal
intensity. Findings are compatible with acute osteomyelitis.

Marked bone marrow edema throughout the fourth metatarsal with
confluent low T1 signal changes extending from the fourth metatarsal
base to the fourth metatarsal neck (series 6, image 15), compatible
with acute osteomyelitis.

Mild bone marrow edema within the second metatarsal and second toe
proximal phalanx with preserved T1 marrow signal, likely reactive
osteitis. Mild patchy bone marrow edema within the midfoot, also
likely reactive.

Ligaments

Intact Lisfranc ligament.  No evidence of acute ligamentous injury.

Muscles and Tendons

Extensive changes of denervation and myositis. No focal tenosynovial
fluid collection.

Soft tissues

Marked diffuse soft tissue swelling and enhancement of the forefoot,
likely representing cellulitis. There is scattered foci of
susceptibility artifact in the region of the resection bed of the
third ray, which may reflect soft tissue gas or micrometallic
debris. No deep soft tissue ulceration. No organized or rim
enhancing fluid collections.
IMPRESSION: 1. Acute osteomyelitis of the residual third metatarsal bone.
2. Acute osteomyelitis of the distal phalanx and middle phalanx of
the second toe.
3. Acute osteomyelitis throughout the fourth metatarsal.
4. Mild bone marrow edema within the second metatarsal and second
toe proximal phalanx, likely reactive osteitis.
5. Marked diffuse soft tissue swelling and enhancement of the
forefoot, likely representing cellulitis. There is scattered foci of
susceptibility artifact in the region of the resection bed of the
third ray, which may reflect soft tissue gas or micrometallic
debris. No organized or rim-enhancing fluid collections.

## 2020-12-25 MED ORDER — HYDRALAZINE HCL 25 MG PO TABS
25.0000 mg | ORAL_TABLET | Freq: Four times a day (QID) | ORAL | Status: DC | PRN
  Administered 2020-12-25: 25 mg via ORAL
  Filled 2020-12-25: qty 1

## 2020-12-25 MED ORDER — GADOBUTROL 1 MMOL/ML IV SOLN
10.0000 mL | Freq: Once | INTRAVENOUS | Status: AC | PRN
  Administered 2020-12-25: 10 mL via INTRAVENOUS

## 2020-12-25 NOTE — Progress Notes (Signed)
PROGRESS NOTE    Carl [name]  JKK:[id number] DOB: 22-Jul-1965 DOA: 12/24/2020 PCP: Alicia Amel, MD   Brief Narrative: Carl [name] is a 55 y.o. male with a history of diabetes mellitus type 2, hypertension, hyperlipidemia. Patient presented secondary to a non-healing wound with evidence of an active foot infection and concern for possible osteomyelitis. Antibiotics initiated. Podiatry consulted   Assessment & Plan:   Active Problems:   BMI 40.0-44.9, adult (HCC)   Hyperlipidemia   Hypertension   Type 2 diabetes mellitus without complications (HCC)   Diabetic foot infection (HCC)   Diabetic foot infection Foot x-ray without evidence of osteomyelitis. No fevers or leukocytosis. Podiatry consulted on admission -Continue Vancomycin/Ceftriaxone -Podiatry recommendations: plan for OR likely on 10/17, partial weightbearing to heel with a Darco wedge shoe -MRI pending  Diabetes mellitus, type 2 Hemoglobin A1C of 7.2%. Patient has been managing with diet changes and metformin. States he ran out of glipizide and has not been using it. -Continue SSI -Discontinue metformin while inpatient  Primary hypertension Hypertensive urgency Patient is on losartan 50 mg daily as an outpatient -Continue losartan 50 mg and amlodipine 5 mg daily; may need to increase amlodipine dose -Hydralazine prn  Hyperlipidemia -Continue Lipitor  Obesity Body mass index is 35.87 kg/m.   DVT prophylaxis: Heparin subq Code Status:   Code Status: Full Code Family Communication: Father and mother at bedside Disposition Plan: Discharge home likely in several days pending podiatry recommendations/management, transition to oral antibiotics   Consultants:  Podiatry  Procedures:  None  Antimicrobials: Vancomycin Ceftriaxone    Subjective: No concerns this morning.  Objective: Vitals:   12/24/20 2315 12/25/20 0013 12/25/20 0812 12/25/20 1157  BP: (!) 173/83 (!) 190/89 (!) 175/86 (!) 174/91   Pulse: 72 73 74 74  Resp: 16 18 20 20   Temp:  98.2 F (36.8 C) 97.8 F (36.6 C) 98 F (36.7 C)  TempSrc:  Oral Oral Oral  SpO2: 98% 98% 98% 98%  Weight:      Height:        Intake/Output Summary (Last 24 hours) at 12/25/2020 1335 Last data filed at 12/25/2020 0357 Gross per 24 hour  Intake 353.04 ml  Output --  Net 353.04 ml   Filed Weights   12/24/20 1813  Weight: 113.4 kg    Examination:  General exam: Appears calm and comfortable  Respiratory system: Clear to auscultation. Respiratory effort normal. Cardiovascular system: S1 & S2 heard, RRR. 1/6 systolic murmur. Gastrointestinal system: Abdomen is nondistended, soft and nontender. No organomegaly or masses felt. Normal bowel sounds heard. Central nervous system: Alert and oriented. No focal neurological deficits. Musculoskeletal: No edema. No calf tenderness. Left second digit is swollen and red.  Skin: No cyanosis. No rashes Psychiatry: Judgement and insight appear normal. Mood & affect appropriate.     Data Reviewed: I have personally reviewed following labs and imaging studies  CBC Lab Results  Component Value Date   WBC 7.1 12/25/2020   RBC 4.38 12/25/2020   HGB 12.1 (L) 12/25/2020   HCT 37.4 (L) 12/25/2020   MCV 85.4 12/25/2020   MCH 27.6 12/25/2020   PLT 190 12/25/2020   MCHC 32.4 12/25/2020   RDW 14.6 12/25/2020   LYMPHSABS 1.8 12/24/2020   MONOABS 0.6 12/24/2020   EOSABS 0.3 12/24/2020   BASOSABS 0.0 12/24/2020     Last metabolic panel Lab Results  Component Value Date   NA 137 12/25/2020   K 3.8 12/25/2020   CL 102  12/25/2020   CO2 25 12/25/2020   BUN 12 12/25/2020   CREATININE 0.48 (L) 12/25/2020   GLUCOSE 156 (H) 12/25/2020   GFRNONAA >60 12/25/2020   GFRAA >89 08/17/2015   CALCIUM 8.6 (L) 12/25/2020   PROT 7.7 12/24/2020   ALBUMIN 4.1 12/24/2020   BILITOT <0.1 (L) 12/24/2020   ALKPHOS 74 12/24/2020   AST 21 12/24/2020   ALT 25 12/24/2020   ANIONGAP 10 12/25/2020    CBG  (last 3)  Recent Labs    12/25/20 0358 12/25/20 0808 12/25/20 1153  GLUCAP 139* 157* 156*     GFR: Estimated Creatinine Clearance: 130.1 mL/min (A) (by C-G formula based on SCr of 0.48 mg/dL (L)).  Coagulation Profile: Recent Labs  Lab 12/24/20 2118  INR 1.0    Recent Results (from the past 240 hour(s))  Resp Panel by RT-PCR (Flu A&B, Covid) Nasopharyngeal Swab     Status: None   Collection Time: 12/24/20  9:02 PM   Specimen: Nasopharyngeal Swab; Nasopharyngeal(NP) swabs in vial transport medium  Result Value Ref Range Status   SARS Coronavirus 2 by RT PCR NEGATIVE NEGATIVE Final    Comment: (NOTE) SARS-CoV-2 target nucleic acids are NOT DETECTED.  The SARS-CoV-2 RNA is generally detectable in upper respiratory specimens during the acute phase of infection. The lowest concentration of SARS-CoV-2 viral copies this assay can detect is 138 copies/mL. A negative result does not preclude SARS-Cov-2 infection and should not be used as the sole basis for treatment or other patient management decisions. A negative result may occur with  improper specimen collection/handling, submission of specimen other than nasopharyngeal swab, presence of viral mutation(s) within the areas targeted by this assay, and inadequate number of viral copies(<138 copies/mL). A negative result must be combined with clinical observations, patient history, and epidemiological information. The expected result is Negative.  Fact Sheet for Patients:  BloggerCourse.com  Fact Sheet for Healthcare Providers:  SeriousBroker.it  This test is no t yet approved or cleared by the [place] FDA and  has been authorized for detection and/or diagnosis of SARS-CoV-2 by FDA under an Emergency Use Authorization (EUA). This EUA will remain  in effect (meaning this test can be used) for the duration of the COVID-19 declaration under Section 564(b)(1) of the Act,  21 U.S.C.section 360bbb-3(b)(1), unless the authorization is terminated  or revoked sooner.       Influenza A by PCR NEGATIVE NEGATIVE Final   Influenza B by PCR NEGATIVE NEGATIVE Final    Comment: (NOTE) The Xpert Xpress SARS-CoV-2/FLU/RSV plus assay is intended as an aid in the diagnosis of influenza from Nasopharyngeal swab specimens and should not be used as a sole basis for treatment. Nasal washings and aspirates are unacceptable for Xpert Xpress SARS-CoV-2/FLU/RSV testing.  Fact Sheet for Patients: BloggerCourse.com  Fact Sheet for Healthcare Providers: SeriousBroker.it  This test is not yet approved or cleared by the [place] FDA and has been authorized for detection and/or diagnosis of SARS-CoV-2 by FDA under an Emergency Use Authorization (EUA). This EUA will remain in effect (meaning this test can be used) for the duration of the COVID-19 declaration under Section 564(b)(1) of the Act, 21 U.S.C. section 360bbb-3(b)(1), unless the authorization is terminated or revoked.  Performed at [facility], [address] [place], [place] [address]         Radiology Studies: DG Foot Complete Left  Result Date: 12/24/2020 CLINICAL DATA:  Diabetes.  Prior third toe amputation.  Fever. EXAM: LEFT FOOT -  COMPLETE 3+ VIEW COMPARISON:  10/29/2020, without report. FINDINGS: Remote amputation of the third digit at the level of the proximal metatarsal. No acute fracture or dislocation. No osseous destruction. Moderate diffuse soft tissue swelling about the midfoot and forefoot. Small Achilles and calcaneal spurs. No soft tissue gas. IMPRESSION: No plain film evidence of osteomyelitis. Diffuse soft tissue swelling. Electronically Signed   By: Jeronimo Greaves M.D.   On: 12/24/2020 19:25        Scheduled Meds:  amLODipine  5 mg Oral Daily   aspirin EC  81 mg Oral Daily   atorvastatin  20 mg Oral Daily    heparin  5,000 Units Subcutaneous Q8H   insulin aspart  0-20 Units Subcutaneous Q4H   losartan  50 mg Oral Daily   metroNIDAZOLE  500 mg Oral Q12H   sodium chloride flush  3 mL Intravenous Q12H   Continuous Infusions:  cefTRIAXone (ROCEPHIN)  IV     vancomycin 1,250 mg (12/25/20 1012)     LOS: 1 day     Jacquelin Hawking, MD [facility] 12/25/2020, 1:35 PM  If 7PM-7AM, please contact night-coverage [email or web address]

## 2020-12-25 NOTE — Consult Note (Signed)
  Subjective:  Patient ID: Carl [name], male    DOB: [birth year],  MRN: [id number]  A 55 y.o. male medical history significant of uncontrolled dm type 2, obesity, HTN, HLD presents with left second digit infection/wound probing down to bone.  Patient was seen by in clinic by Dr. Samuella Cota yesterday and was sent over the emergency room be admitted for IV antibiotics and possible amputation.  Patient states that there is increasing warmth and swelling around the left second digit.  He states that he has been working a lot on his foot that led to the ulceration.  He was sent over to the hospital to be admitted for IV antibiotics and amputation.  He denies any other acute complaints.  He denies any nausea fever chills vomiting shortness of breath  Objective:   Vitals:   12/25/20 0812 12/25/20 1157  BP: (!) 175/86 (!) 174/91  Pulse: 74 74  Resp: 20 20  Temp: 97.8 F (36.6 C) 98 F (36.7 C)  SpO2: 98% 98%   General AA&O x3. Normal mood and affect.  Vascular Dorsalis pedis and posterior tibial pulses 2/4 bilat. Brisk capillary refill to all digits. Pedal hair present.  Neurologic Epicritic sensation grossly intact.  Dermatologic Left second digit ulceration fiber granular wound base probing down to bone.  No purulent drainage noted.  Mild maceration present circumferentially around the ulcer site.  Mild malodor present.  Left dorsal foot wound probes down to tenderness level.  Does not probe down to bone.  No purulent drainage noted no erythema noted.  Febrile granular wound base.  No malodor present.  No crepitus noted.  Orthopedic: MMT 5/5 in dorsiflexion, plantarflexion, inversion, and eversion. Normal joint ROM without pain or crepitus.       Assessment & Plan:  Patient was evaluated and treated and all questions answered.  Left second digit ulceration with underlying osteomyelitis and left dorsal foot wound -All questions and concerns were discussed with the patient in extensive  detail. -Patient will need left second digit amputation partially to address the osteomyelitis.  However prior to undergoing the amputation patient will need an MRI. -Awaiting MRI for further surgical planning.  Patient may have any involvement of the third metatarsal as well.  If there is any involvement of osteomyelitis to the third metatarsal patient may need a resection versus bone biopsy. -We will plan on the OR tentatively on Monday.  N.p.o. after midnight on Sunday. -Local wound care with Betadine wet-to-dry dressing -Partial weightbearing to the heel with a Darco wedge shoe  Candelaria Stagers, DPM  Accessible via secure chat for questions or concerns.

## 2020-12-26 DIAGNOSIS — M869 Osteomyelitis, unspecified: Secondary | ICD-10-CM

## 2020-12-26 DIAGNOSIS — M86172 Other acute osteomyelitis, left ankle and foot: Secondary | ICD-10-CM

## 2020-12-26 LAB — GLUCOSE, CAPILLARY
Glucose-Capillary: 135 mg/dL — ABNORMAL HIGH (ref 70–99)
Glucose-Capillary: 145 mg/dL — ABNORMAL HIGH (ref 70–99)
Glucose-Capillary: 148 mg/dL — ABNORMAL HIGH (ref 70–99)
Glucose-Capillary: 167 mg/dL — ABNORMAL HIGH (ref 70–99)
Glucose-Capillary: 180 mg/dL — ABNORMAL HIGH (ref 70–99)
Glucose-Capillary: 184 mg/dL — ABNORMAL HIGH (ref 70–99)

## 2020-12-26 MED ORDER — GLUCERNA SHAKE PO LIQD
237.0000 mL | Freq: Two times a day (BID) | ORAL | Status: DC
Start: 2020-12-26 — End: 2020-12-30
  Administered 2020-12-26 – 2020-12-30 (×6): 237 mL via ORAL
  Filled 2020-12-26 (×9): qty 237

## 2020-12-26 MED ORDER — PROSOURCE PLUS PO LIQD
30.0000 mL | Freq: Two times a day (BID) | ORAL | Status: DC
  Administered 2020-12-26 – 2020-12-30 (×8): 30 mL via ORAL
  Filled 2020-12-26 (×8): qty 30

## 2020-12-26 MED ORDER — ADULT MULTIVITAMIN W/MINERALS CH
1.0000 | ORAL_TABLET | Freq: Every day | ORAL | Status: DC
  Administered 2020-12-26 – 2020-12-30 (×5): 1 via ORAL
  Filled 2020-12-26 (×5): qty 1

## 2020-12-26 MED ORDER — JUVEN PO PACK
1.0000 | PACK | Freq: Two times a day (BID) | ORAL | Status: DC
  Administered 2020-12-26 – 2020-12-30 (×7): 1 via ORAL
  Filled 2020-12-26 (×9): qty 1

## 2020-12-26 MED ORDER — AMLODIPINE BESYLATE 10 MG PO TABS
10.0000 mg | ORAL_TABLET | Freq: Every day | ORAL | Status: DC
  Administered 2020-12-27 – 2020-12-30 (×4): 10 mg via ORAL
  Filled 2020-12-26 (×4): qty 1

## 2020-12-26 NOTE — Progress Notes (Signed)
PROGRESS NOTE    Carl [name]  GUR:[id number] DOB: March 07, 1966 DOA: 12/24/2020 PCP: Alicia Amel, MD   Brief Narrative: Carl [name] is a 55 y.o. male with a history of diabetes mellitus type 2, hypertension, hyperlipidemia. Patient presented secondary to a non-healing wound with evidence of an active foot infection and concern for possible osteomyelitis. Antibiotics initiated. Podiatry consulted   Assessment & Plan:   Active Problems:   BMI 40.0-44.9, adult (HCC)   Hyperlipidemia   Hypertension   Type 2 diabetes mellitus with foot ulcer (HCC)   Diabetic foot infection (HCC)   Diabetic foot infection Left foot osteomyelitis Foot x-ray without evidence of osteomyelitis. No fevers or leukocytosis. Podiatry consulted on admission. MRI confirms osteomyelitis of distal/middle phalanx of left second toe in addition to osteomyelitis affecting the left third metatarsal bone. Blood cultures with no growth to date. -Continue Vancomycin/Ceftriaxone -Podiatry recommendations: plan for OR likely on 10/17, partial weightbearing to heel with a Darco wedge shoe; recommendations pending today  Diabetes mellitus, type 2 Hemoglobin A1C of 7.2%. Patient has been managing with diet changes and metformin. States he ran out of glipizide and has not been using it. Metformin discontinued while inpatient. -Continue SSI  Primary hypertension Hypertensive urgency Patient is on losartan 50 mg daily as an outpatient -Continue losartan 50 mg and increase to amlodipine 10 mg daily -Hydralazine prn  Hyperlipidemia -Continue Lipitor  Obesity Body mass index is 35.87 kg/m.   DVT prophylaxis: Heparin subq Code Status:   Code Status: Full Code Family Communication: None at bedside Disposition Plan: Discharge home likely in several days pending podiatry recommendations/management, transition to oral antibiotics   Consultants:  Podiatry  Procedures:   None  Antimicrobials: Vancomycin Ceftriaxone    Subjective: No issues overnight  Objective: Vitals:   12/25/20 2005 12/25/20 2007 12/25/20 2152 12/26/20 0418  BP: (!) 162/109 (!) 180/89 (!) 148/78 (!) 169/82  Pulse: 71 70 73 70  Resp: 16   20  Temp: 98.2 F (36.8 C)   98.2 F (36.8 C)  TempSrc: Oral   Oral  SpO2: 98%   99%  Weight:      Height:        Intake/Output Summary (Last 24 hours) at 12/26/2020 1043 Last data filed at 12/26/2020 0542 Gross per 24 hour  Intake 1111.11 ml  Output --  Net 1111.11 ml    Filed Weights   12/24/20 1813  Weight: 113.4 kg    Examination:  General exam: Appears calm and comfortable Respiratory system: Clear to auscultation. Respiratory effort normal. Cardiovascular system: S1 & S2 heard, RRR. No murmurs, rubs, gallops or clicks. Gastrointestinal system: Abdomen is nondistended, soft and nontender. No organomegaly or masses felt. Normal bowel sounds heard. Central nervous system: Alert and oriented. No focal neurological deficits. Musculoskeletal: No edema. No calf tenderness Skin: No cyanosis. Erythema over dorsum of left foot. Left second toe is erythematous Psychiatry: Judgement and insight appear normal. Mood & affect appropriate.     Data Reviewed: I have personally reviewed following labs and imaging studies  CBC Lab Results  Component Value Date   WBC 7.1 12/25/2020   RBC 4.38 12/25/2020   HGB 12.1 (L) 12/25/2020   HCT 37.4 (L) 12/25/2020   MCV 85.4 12/25/2020   MCH 27.6 12/25/2020   PLT 190 12/25/2020   MCHC 32.4 12/25/2020   RDW 14.6 12/25/2020   LYMPHSABS 1.8 12/24/2020   MONOABS 0.6 12/24/2020   EOSABS 0.3 12/24/2020   BASOSABS 0.0 12/24/2020  Last metabolic panel Lab Results  Component Value Date   NA 137 12/25/2020   K 3.8 12/25/2020   CL 102 12/25/2020   CO2 25 12/25/2020   BUN 12 12/25/2020   CREATININE 0.48 (L) 12/25/2020   GLUCOSE 156 (H) 12/25/2020   GFRNONAA >60 12/25/2020   GFRAA  >89 08/17/2015   CALCIUM 8.6 (L) 12/25/2020   PROT 7.7 12/24/2020   ALBUMIN 4.1 12/24/2020   BILITOT <0.1 (L) 12/24/2020   ALKPHOS 74 12/24/2020   AST 21 12/24/2020   ALT 25 12/24/2020   ANIONGAP 10 12/25/2020    CBG (last 3)  Recent Labs    12/25/20 2353 12/26/20 0421 12/26/20 0720  GLUCAP 176* 135* 148*      GFR: Estimated Creatinine Clearance: 130.1 mL/min (A) (by C-G formula based on SCr of 0.48 mg/dL (L)).  Coagulation Profile: Recent Labs  Lab 12/24/20 2118  INR 1.0     Recent Results (from the past 240 hour(s))  Resp Panel by RT-PCR (Flu A&B, Covid) Nasopharyngeal Swab     Status: None   Collection Time: 12/24/20  9:02 PM   Specimen: Nasopharyngeal Swab; Nasopharyngeal(NP) swabs in vial transport medium  Result Value Ref Range Status   SARS Coronavirus 2 by RT PCR NEGATIVE NEGATIVE Final    Comment: (NOTE) SARS-CoV-2 target nucleic acids are NOT DETECTED.  The SARS-CoV-2 RNA is generally detectable in upper respiratory specimens during the acute phase of infection. The lowest concentration of SARS-CoV-2 viral copies this assay can detect is 138 copies/mL. A negative result does not preclude SARS-Cov-2 infection and should not be used as the sole basis for treatment or other patient management decisions. A negative result may occur with  improper specimen collection/handling, submission of specimen other than nasopharyngeal swab, presence of viral mutation(s) within the areas targeted by this assay, and inadequate number of viral copies(<138 copies/mL). A negative result must be combined with clinical observations, patient history, and epidemiological information. The expected result is Negative.  Fact Sheet for Patients:  BloggerCourse.com  Fact Sheet for Healthcare Providers:  SeriousBroker.it  This test is no t yet approved or cleared by the [place] FDA and  has been authorized for detection  and/or diagnosis of SARS-CoV-2 by FDA under an Emergency Use Authorization (EUA). This EUA will remain  in effect (meaning this test can be used) for the duration of the COVID-19 declaration under Section 564(b)(1) of the Act, 21 U.S.C.section 360bbb-3(b)(1), unless the authorization is terminated  or revoked sooner.       Influenza A by PCR NEGATIVE NEGATIVE Final   Influenza B by PCR NEGATIVE NEGATIVE Final    Comment: (NOTE) The Xpert Xpress SARS-CoV-2/FLU/RSV plus assay is intended as an aid in the diagnosis of influenza from Nasopharyngeal swab specimens and should not be used as a sole basis for treatment. Nasal washings and aspirates are unacceptable for Xpert Xpress SARS-CoV-2/FLU/RSV testing.  Fact Sheet for Patients: BloggerCourse.com  Fact Sheet for Healthcare Providers: SeriousBroker.it  This test is not yet approved or cleared by the [place] FDA and has been authorized for detection and/or diagnosis of SARS-CoV-2 by FDA under an Emergency Use Authorization (EUA). This EUA will remain in effect (meaning this test can be used) for the duration of the COVID-19 declaration under Section 564(b)(1) of the Act, 21 U.S.C. section 360bbb-3(b)(1), unless the authorization is terminated or revoked.  Performed at [facility], [address], [place], [place] [address]   Blood Culture (routine x 2)  Status: None (Preliminary result)   Collection Time: 12/24/20  9:16 PM   Specimen: BLOOD  Result Value Ref Range Status   Specimen Description   Final    BLOOD BLOOD RIGHT HAND Performed at [facility], [address], [place], [place] [address]    Special Requests   Final    BOTTLES DRAWN AEROBIC AND ANAEROBIC Blood Culture results may not be optimal due to an excessive volume of blood received in culture bottles Performed at [facility], [address]  [place], [place] [address]    Culture   Final    NO GROWTH < 24 HOURS Performed at [facility], [address], [place], [place] [address]    Report Status PENDING  Incomplete  Blood Culture (routine x 2)     Status: None (Preliminary result)   Collection Time: 12/25/20  5:16 AM   Specimen: BLOOD  Result Value Ref Range Status   Specimen Description   Final    BLOOD LEFT ANTECUBITAL Performed at [facility], [address], [place], [place] [address]    Special Requests   Final    BOTTLES DRAWN AEROBIC ONLY Blood Culture adequate volume Performed at [facility], [address] [place], [place] [address]    Culture   Final    NO GROWTH < 24 HOURS Performed at [facility], [address] [place], [place] [address]    Report Status PENDING  Incomplete         Radiology Studies: MRI Left foot with and without contrast  Result Date: 12/25/2020 CLINICAL DATA:  Foot swelling, diabetic, osteomyelitis suspected, xray done EXAM: MRI OF THE LEFT FOREFOOT WITHOUT AND WITH CONTRAST TECHNIQUE: Multiplanar, multisequence MR imaging of the left forefoot was performed both before and after administration of intravenous contrast. CONTRAST:  65mL GADAVIST GADOBUTROL 1 MMOL/ML IV SOLN COMPARISON:  X-ray 12/24/2020 FINDINGS: Bones/Joint/Cartilage Patient is status post amputation of the third ray at the level of the proximal metatarsal diaphysis. There is marked bone marrow edema with confluent low T1 marrow signal and fragmentation of the residual third metatarsal extending nearly to the level of the articular surface (series 6, images 11-12). Findings are compatible with acute osteomyelitis. Flexion deformity of the second toe. There is destruction of the distal phalanx of the second toe. Prominent bone marrow edema within the middle phalanx of the second toe with intermediate signal intensity. Findings are compatible with acute osteomyelitis. Marked  bone marrow edema throughout the fourth metatarsal with confluent low T1 signal changes extending from the fourth metatarsal base to the fourth metatarsal neck (series 6, image 15), compatible with acute osteomyelitis. Mild bone marrow edema within the second metatarsal and second toe proximal phalanx with preserved T1 marrow signal, likely reactive osteitis. Mild patchy bone marrow edema within the midfoot, also likely reactive. Ligaments Intact Lisfranc ligament.  No evidence of acute ligamentous injury. Muscles and Tendons Extensive changes of denervation and myositis. No focal tenosynovial fluid collection. Soft tissues Marked diffuse soft tissue swelling and enhancement of the forefoot, likely representing cellulitis. There is scattered foci of susceptibility artifact in the region of the resection bed of the third ray, which may reflect soft tissue gas or micrometallic debris. No deep soft tissue ulceration. No organized or rim enhancing fluid collections. IMPRESSION: 1. Acute osteomyelitis of the residual third metatarsal bone. 2. Acute osteomyelitis of the distal phalanx and middle phalanx of the second toe. 3. Acute osteomyelitis  throughout the fourth metatarsal. 4. Mild bone marrow edema within the second metatarsal and second toe proximal phalanx, likely reactive osteitis. 5. Marked diffuse soft tissue swelling and enhancement of the forefoot, likely representing cellulitis. There is scattered foci of susceptibility artifact in the region of the resection bed of the third ray, which may reflect soft tissue gas or micrometallic debris. No organized or rim-enhancing fluid collections. Electronically Signed   By: Duanne Guess D.O.   On: 12/25/2020 18:32   DG Foot Complete Left  Result Date: 12/24/2020 CLINICAL DATA:  Diabetes.  Prior third toe amputation.  Fever. EXAM: LEFT FOOT - COMPLETE 3+ VIEW COMPARISON:  10/29/2020, without report. FINDINGS: Remote amputation of the third digit at the level of  the proximal metatarsal. No acute fracture or dislocation. No osseous destruction. Moderate diffuse soft tissue swelling about the midfoot and forefoot. Small Achilles and calcaneal spurs. No soft tissue gas. IMPRESSION: No plain film evidence of osteomyelitis. Diffuse soft tissue swelling. Electronically Signed   By: Jeronimo Greaves M.D.   On: 12/24/2020 19:25        Scheduled Meds:  amLODipine  5 mg Oral Daily   aspirin EC  81 mg Oral Daily   atorvastatin  20 mg Oral Daily   heparin  5,000 Units Subcutaneous Q8H   insulin aspart  0-20 Units Subcutaneous Q4H   losartan  50 mg Oral Daily   metroNIDAZOLE  500 mg Oral Q12H   sodium chloride flush  3 mL Intravenous Q12H   Continuous Infusions:  cefTRIAXone (ROCEPHIN)  IV Stopped (12/25/20 2234)   vancomycin 1,250 mg (12/26/20 1008)     LOS: 2 days     Jacquelin Hawking, MD [facility] 12/26/2020, 10:43 AM  If 7PM-7AM, please contact night-coverage [email or web address]

## 2020-12-26 NOTE — Consult Note (Signed)
  Subjective:  Patient ID: Carl [name], male    DOB: [birth year],  MRN: [id number]  A 55 y.o. male was seen at bedside for left foot.  Patient states that he does not have any pain.  The MRI was done and he is here to discuss next treatment plan.  He denies any nausea fever chills vomiting.  No acute issues.  Objective:   Vitals:   12/25/20 2152 12/26/20 0418  BP: (!) 148/78 (!) 169/82  Pulse: 73 70  Resp:  20  Temp:  98.2 F (36.8 C)  SpO2:  99%   General AA&O x3. Normal mood and affect.  Vascular Dorsalis pedis and posterior tibial pulses 2/4 bilat. Brisk capillary refill to all digits. Pedal hair present.  Neurologic Epicritic sensation grossly intact.  Dermatologic Left second digit ulceration fiber granular wound base probing down to bone.  No purulent drainage noted.  Mild maceration present circumferentially around the ulcer site.  Mild malodor present.  Left dorsal foot wound probes down to tenderness level.  Does not probe down to bone.  No purulent drainage noted no erythema noted.  Febrile granular wound base.  No malodor present.  No crepitus noted.  Orthopedic: MMT 5/5 in dorsiflexion, plantarflexion, inversion, and eversion. Normal joint ROM without pain or crepitus.      1. Acute osteomyelitis of the residual third metatarsal bone. 2. Acute osteomyelitis of the distal phalanx and middle phalanx of the second toe. 3. Acute osteomyelitis throughout the fourth metatarsal. 4. Mild bone marrow edema within the second metatarsal and second toe proximal phalanx, likely reactive osteitis. 5. Marked diffuse soft tissue swelling and enhancement of the forefoot, likely representing cellulitis. There is scattered foci of susceptibility artifact in the region of the resection bed of the third ray, which may reflect soft tissue gas or micrometallic debris. No organized or rim-enhancing fluid collections. Assessment & Plan:  Patient was evaluated and treated and all questions  answered.  Left second digit ulceration with underlying osteomyelitis and left dorsal foot wound -All questions and concerns were discussed with the patient in extensive detail. -MRI was reviewed and given the findings I believe patient will benefit from a transmetatarsal amputation since there is any involvement of the third ray as well as fourth ray.  I also discussed with the patient the importance of glucose control and management.  Patient states understanding. -OR schedule for tomorrow n.p.o. after midnight -Local wound care with Betadine wet-to-dry dressing -Nonweightbearing to the left lower extremity after the surgery.  Candelaria Stagers, DPM  Accessible via secure chat for questions or concerns.

## 2020-12-26 NOTE — TOC Progression Note (Signed)
Transition of Care [facility]) - Progression Note    Patient Details  Name: SAYER MASINI MRN: [id number] Date of Birth: April 12, 1965  Transition of Care [facility]) CM/SW Contact  Darleene Cleaver, [place] Phone Number: 12/26/2020, 12:51 PM  Clinical Narrative:     CSW was informed that patient's family wanted to speak to a Child psychotherapist.  CSW contacted his daughter Irving Burton [phone] to find out what they needed assistance with.  CSW spoke to patient's daughter and she stated that patient will be having surgery and possible amputation of toes.  Daughter was asking how patient can start the process for short term disability.  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  CSW also provided contact information for [facility] DSS, and social security office.  CSW advised to contact both agencies.  CSW informed her that hospital CSW do not assist with short term disability due to how long the process is for patients to get approved.  CSW asked if she had any other questions, per daughters she did not.  TOC team to continue to follow patient's progress throughout discharge planning.        Expected Discharge Plan and Services                                                 Social Determinants of Health (SDOH) Interventions    Readmission Risk Interventions No flowsheet data found.

## 2020-12-26 NOTE — Progress Notes (Signed)
Initial Nutrition Assessment RD working remotely.  DOCUMENTATION CODES:   Obesity unspecified  INTERVENTION:  - will order Glucerna Shake TID, each supplement provides 220 kcal and 10 grams of protein. - will order 1 packet Juven BID, each packet provides 95 calories, 2.5 grams of protein (collagen), and 9.8 grams of carbohydrate (3 grams sugar); also contains 7 grams of L-arginine and L-glutamine, 300 mg vitamin C, 15 mg vitamin E, 1.2 mcg vitamin B-12, 9.5 mg zinc, 200 mg calcium, and 1.5 g  Calcium Beta-hydroxy-Beta-methylbutyrate to support wound healing. - will order 30 ml Prosource Plus BID, each supplement provides 100 kcal and 15 grams protein.  - will order 1 tablet multivitamin with minerals/day.    NUTRITION DIAGNOSIS:   Increased nutrient needs related to acute illness, wound healing as evidenced by estimated needs.  GOAL:   Patient will meet greater than or equal to 90% of their needs  MONITOR:   PO intake, Supplement acceptance, Labs, Weight trends  REASON FOR ASSESSMENT:   Consult Wound healing  ASSESSMENT:   55 y.o. male with medical history of type 2 DM, HTN, and HLD. Patient presented to the ED due to non-healing wound, evidence of active foot infection, and concern for osteomyelitis. Abx were started in the ED and Podiatry consulted.  Diet advanced from NPO to Carb Modified yesterday at 0738. No intakes documented since that time.   He has not been seen by a [place] RD in the past.   Weight on 10/14 documented as 250 lb which appears to be a stated weight. Weight on 10/27/20 documented as the same. Weight on 10/04/20 was 237 lb and on 09/26/20 was 239 lb.   Per notes: - L foot osteomyelitis, confirmed on MRI - plan for Podiatry to take to OR on 10/17   Labs reviewed; HgbA1c: 7.2%, CBGs: 135 and 148 mg/dl, creatinine: 7.35 mg/dl, Ca: 8.6 mg/dl.  Medications reviewed; sliding scale novolog.    NUTRITION - FOCUSED PHYSICAL EXAM:  Unable to complete  at this time.   Diet Order:   Diet Order             Diet Carb Modified Fluid consistency: Thin; Room service appropriate? Yes  Diet effective now                   EDUCATION NEEDS:   Not appropriate for education at this time  Skin:  Skin Assessment: Skin Integrity Issues: Skin Integrity Issues:: Diabetic Ulcer, Incisions Diabetic Ulcer: L toe (unsure which one) Incisions: open/non-healing wound to L foot  Last BM:  PTA/unknown  Height:   Ht Readings from Last 1 Encounters:  12/24/20 5\' 10"  (1.778 m)    Weight:   Wt Readings from Last 1 Encounters:  12/24/20 113.4 kg      Estimated Nutritional Needs:  Kcal:  2200-2450 kcal Protein:  115-130 grams Fluid:  >/= 2.4 L/day      12/26/20, MS, RD, LDN, CNSC Inpatient Clinical Dietitian RD pager # available in AMION  After hours/weekend pager # available in [facility]

## 2020-12-27 ENCOUNTER — Inpatient Hospital Stay (HOSPITAL_COMMUNITY): Payer: Self-pay

## 2020-12-27 ENCOUNTER — Inpatient Hospital Stay (HOSPITAL_COMMUNITY): Payer: Self-pay | Admitting: Certified Registered"

## 2020-12-27 ENCOUNTER — Encounter (HOSPITAL_COMMUNITY)
Admission: EM | Disposition: A | Discharge status: Home / Self Care | Payer: Self-pay | Source: Home / Self Care | Attending: Family Medicine

## 2020-12-27 ENCOUNTER — Other Ambulatory Visit: Payer: Self-pay

## 2020-12-27 ENCOUNTER — Ambulatory Visit: Payer: Self-pay | Admitting: Podiatry

## 2020-12-27 ENCOUNTER — Encounter (HOSPITAL_COMMUNITY): Payer: Self-pay | Admitting: Internal Medicine

## 2020-12-27 DIAGNOSIS — M86672 Other chronic osteomyelitis, left ankle and foot: Secondary | ICD-10-CM

## 2020-12-27 DIAGNOSIS — M86172 Other acute osteomyelitis, left ankle and foot: Secondary | ICD-10-CM

## 2020-12-27 HISTORY — PX: AMPUTATION: SHX166

## 2020-12-27 LAB — GLUCOSE, CAPILLARY
Glucose-Capillary: 162 mg/dL — ABNORMAL HIGH (ref 70–99)
Glucose-Capillary: 184 mg/dL — ABNORMAL HIGH (ref 70–99)
Glucose-Capillary: 148 mg/dL — ABNORMAL HIGH (ref 70–99)
Glucose-Capillary: 136 mg/dL — ABNORMAL HIGH (ref 70–99)
Glucose-Capillary: 148 mg/dL — ABNORMAL HIGH (ref 70–99)
Glucose-Capillary: 177 mg/dL — ABNORMAL HIGH (ref 70–99)
Glucose-Capillary: 195 mg/dL — ABNORMAL HIGH (ref 70–99)

## 2020-12-27 LAB — CREATININE, SERUM
Creatinine, Ser: 0.53 mg/dL — ABNORMAL LOW (ref 0.61–1.24)
GFR, Estimated: >60 mL/min (ref >60)

## 2020-12-27 IMAGING — DX DG FOOT COMPLETE 3+V*L*
3 series · 3 of 3 positions shown · non-contrast
Comparison: [DATE]

CLINICAL DATA: Follow-up amputation

EXAM:
LEFT FOOT - COMPLETE 3+ VIEW

[foot ap]
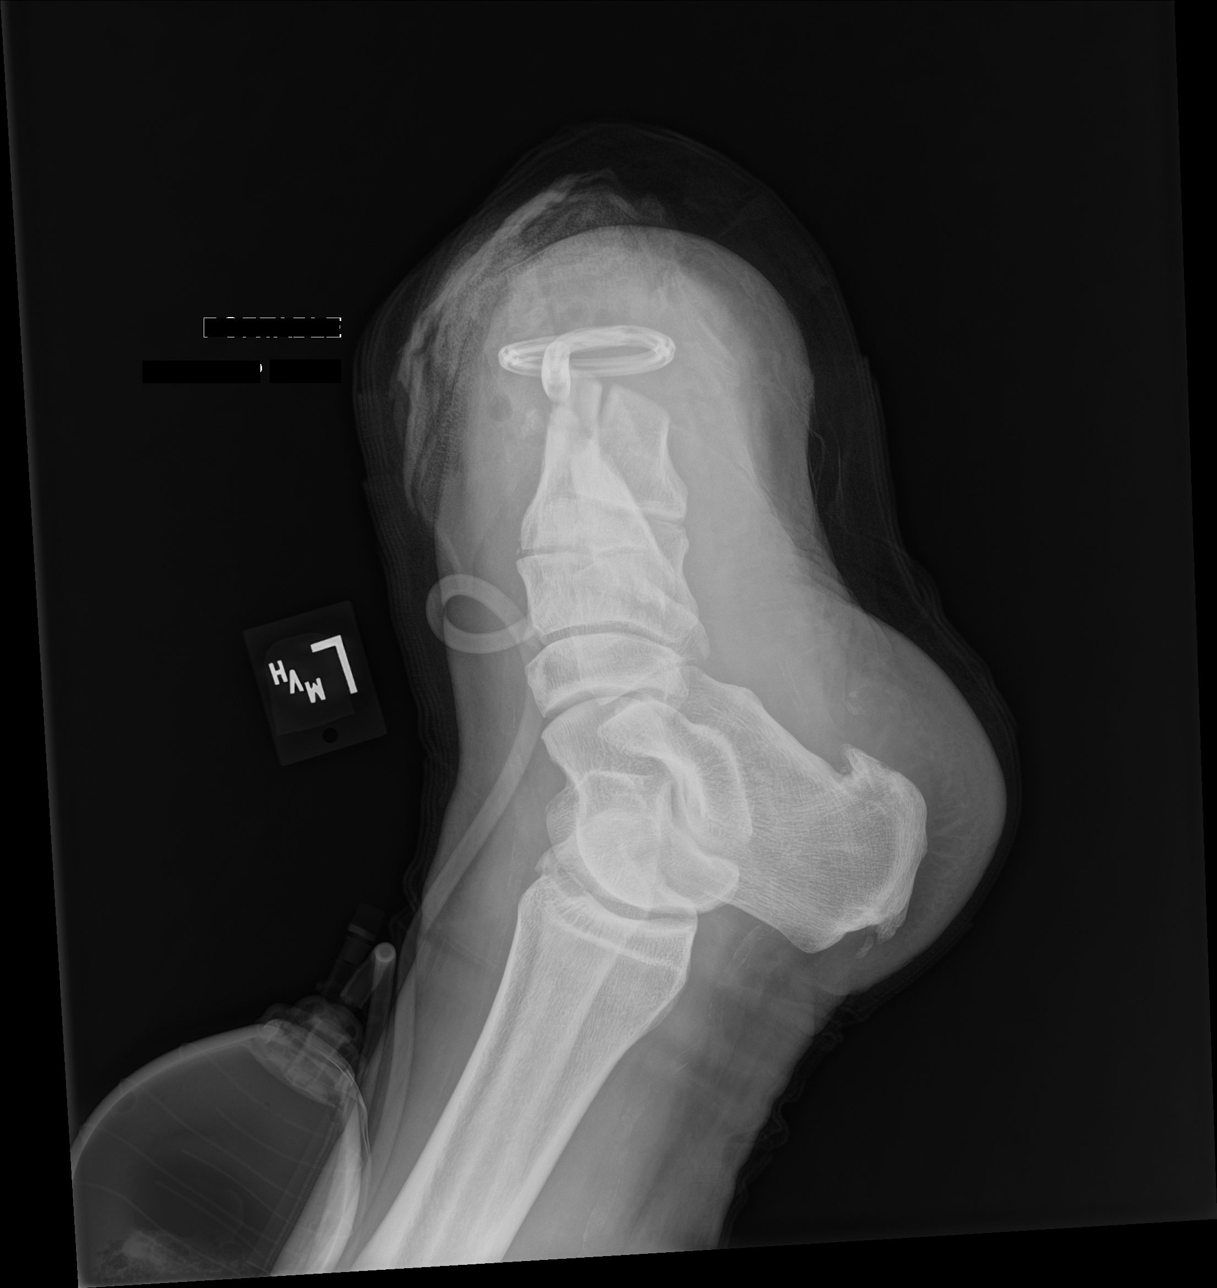

[foot obl]
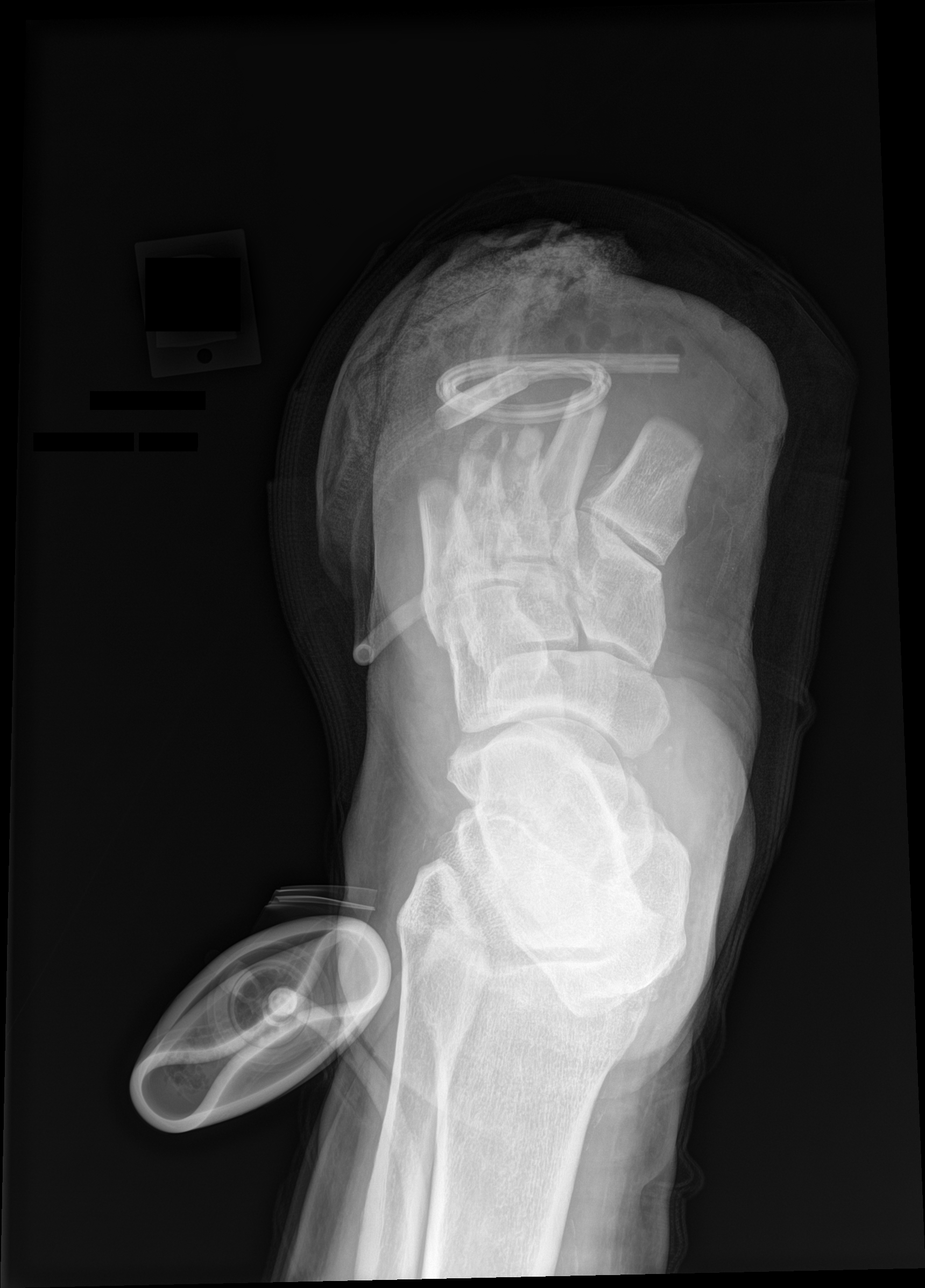

[foot lat]
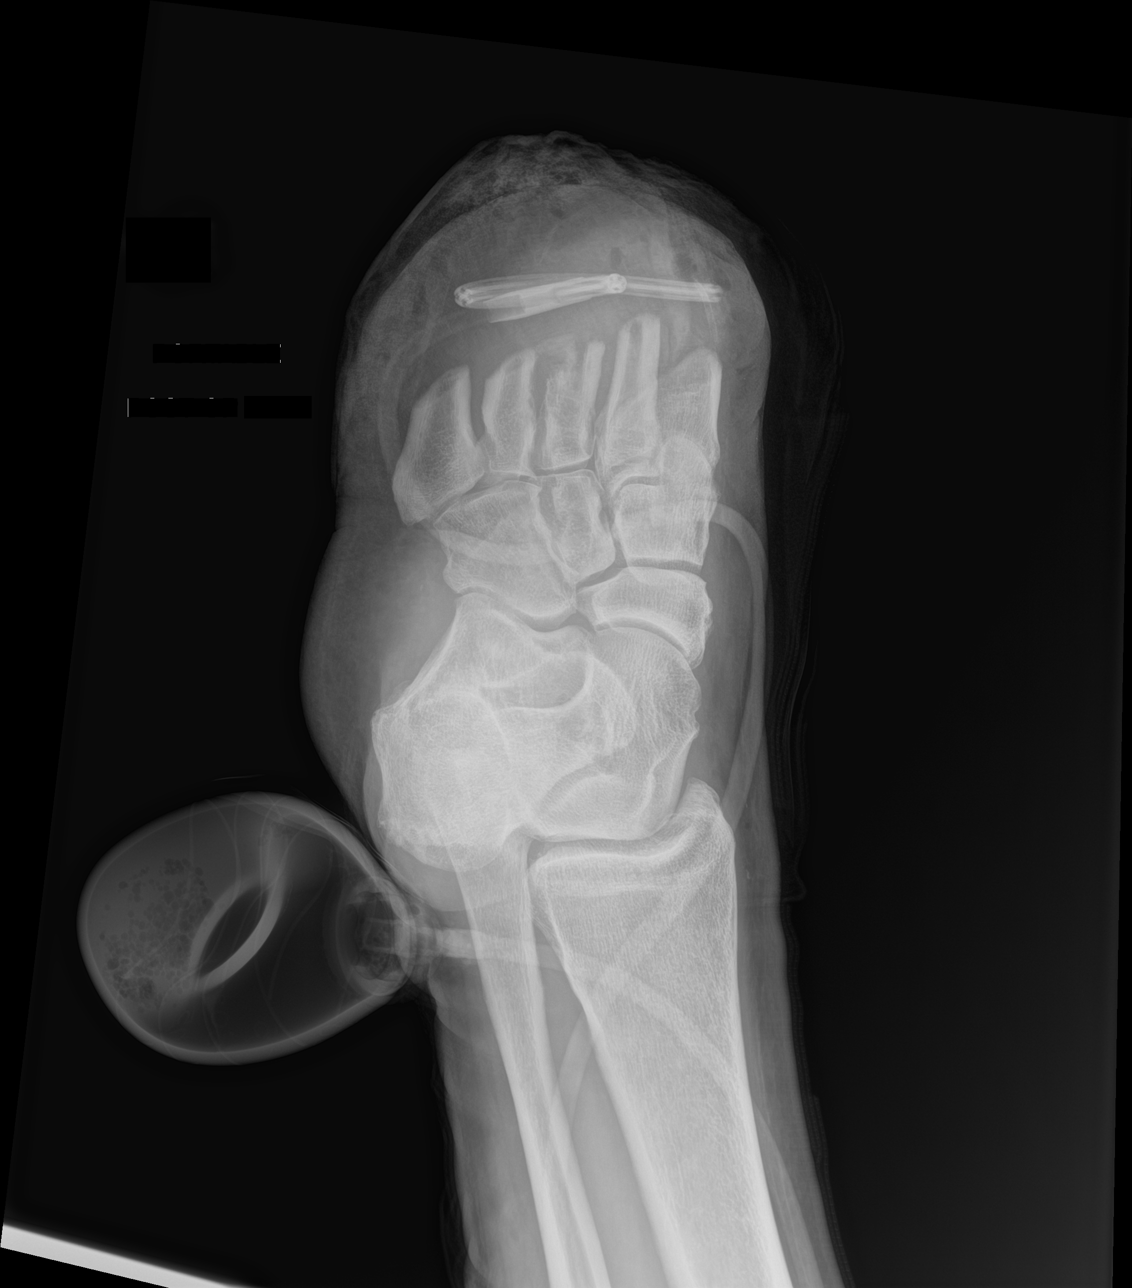

[3 of 3 positions shown; findings below may reference images not displayed]

FINDINGS: Three views show amputation of the forefoot at the proximal
metatarsal level. Soft tissue drain in place. No complicating
feature by radiography.
IMPRESSION: Amputation of the forefoot at the proximal metatarsal level.

## 2020-12-27 SURGERY — AMPUTATION DIGIT
Anesthesia: Monitor Anesthesia Care | Site: Foot | Laterality: Left

## 2020-12-27 MED ORDER — MIDAZOLAM HCL 2 MG/2ML IJ SOLN
INTRAMUSCULAR | Status: AC
  Filled 2020-12-27: qty 2

## 2020-12-27 MED ORDER — PROMETHAZINE HCL 25 MG/ML IJ SOLN: 6.2500 mg | INTRAMUSCULAR | Status: DC | PRN

## 2020-12-27 MED ORDER — FENTANYL CITRATE (PF) 100 MCG/2ML IJ SOLN
INTRAMUSCULAR | Status: AC
  Filled 2020-12-27: qty 2

## 2020-12-27 MED ORDER — LIDOCAINE HCL (PF) 1 % IJ SOLN
INTRAMUSCULAR | Status: AC
  Filled 2020-12-27: qty 30

## 2020-12-27 MED ORDER — FENTANYL CITRATE PF 50 MCG/ML IJ SOSY: 25.0000 ug | PREFILLED_SYRINGE | INTRAMUSCULAR | Status: DC | PRN

## 2020-12-27 MED ORDER — PROPOFOL 500 MG/50ML IV EMUL
INTRAVENOUS | Status: DC | PRN
  Administered 2020-12-27: 75 ug/kg/min via INTRAVENOUS

## 2020-12-27 MED ORDER — PROPOFOL 500 MG/50ML IV EMUL
INTRAVENOUS | Status: AC
  Filled 2020-12-27: qty 50

## 2020-12-27 MED ORDER — LIDOCAINE HCL (PF) 1 % IJ SOLN
INTRAMUSCULAR | Status: DC | PRN
  Administered 2020-12-27: 10 mL

## 2020-12-27 MED ORDER — ONDANSETRON HCL 4 MG/2ML IJ SOLN
INTRAMUSCULAR | Status: AC
  Filled 2020-12-27: qty 2

## 2020-12-27 MED ORDER — MEPERIDINE HCL 50 MG/ML IJ SOLN: 6.2500 mg | INTRAMUSCULAR | Status: DC | PRN

## 2020-12-27 MED ORDER — MIDAZOLAM HCL 5 MG/5ML IJ SOLN
INTRAMUSCULAR | Status: DC | PRN
  Administered 2020-12-27: 2 mg via INTRAVENOUS

## 2020-12-27 MED ORDER — PHENYLEPHRINE 40 MCG/ML (10ML) SYRINGE FOR IV PUSH (FOR BLOOD PRESSURE SUPPORT)
PREFILLED_SYRINGE | INTRAVENOUS | Status: AC
  Filled 2020-12-27: qty 10

## 2020-12-27 MED ORDER — 0.9 % SODIUM CHLORIDE (POUR BTL) OPTIME
TOPICAL | Status: DC | PRN
  Administered 2020-12-27: 1000 mL

## 2020-12-27 MED ORDER — EPHEDRINE 5 MG/ML INJ
INTRAVENOUS | Status: AC
  Filled 2020-12-27: qty 10

## 2020-12-27 MED ORDER — PROPOFOL 500 MG/50ML IV EMUL
INTRAVENOUS | Status: AC
  Filled 2020-12-27: qty 100

## 2020-12-27 MED ORDER — LACTATED RINGERS IV SOLN: INTRAVENOUS | Status: DC

## 2020-12-27 MED ORDER — LIDOCAINE HCL (PF) 2 % IJ SOLN
INTRAMUSCULAR | Status: AC
  Filled 2020-12-27: qty 5

## 2020-12-27 MED ORDER — FENTANYL CITRATE (PF) 100 MCG/2ML IJ SOLN
INTRAMUSCULAR | Status: DC | PRN
  Administered 2020-12-27: 50 ug via INTRAVENOUS

## 2020-12-27 MED ORDER — BUPIVACAINE HCL (PF) 0.25 % IJ SOLN
INTRAMUSCULAR | Status: DC | PRN
  Administered 2020-12-27: 10 mL

## 2020-12-27 MED ORDER — PHENYLEPHRINE 40 MCG/ML (10ML) SYRINGE FOR IV PUSH (FOR BLOOD PRESSURE SUPPORT)
PREFILLED_SYRINGE | INTRAVENOUS | Status: DC | PRN
  Administered 2020-12-27 (×3): 80 ug via INTRAVENOUS

## 2020-12-27 MED ORDER — BUPIVACAINE HCL (PF) 0.25 % IJ SOLN
INTRAMUSCULAR | Status: AC
  Filled 2020-12-27: qty 30

## 2020-12-27 SURGICAL SUPPLY — 34 items
BAG COUNTER SPONGE SURGICOUNT (BAG) IMPLANT
BAG SPNG CNTER NS LX DISP (BAG)
BLADE AVERAGE 25X9 (BLADE) ×1 IMPLANT
BLADE SAW SGTL 81X20 HD (BLADE) IMPLANT
BNDG CMPR 9X4 STRL LF SNTH (GAUZE/BANDAGES/DRESSINGS)
BNDG COHESIVE 4X5 TAN ST LF (GAUZE/BANDAGES/DRESSINGS) ×2 IMPLANT
BNDG ELASTIC 4X5.8 VLCR STR LF (GAUZE/BANDAGES/DRESSINGS) ×2 IMPLANT
BNDG ESMARK 4X9 LF (GAUZE/BANDAGES/DRESSINGS) IMPLANT
BNDG GAUZE ELAST 4 BULKY (GAUZE/BANDAGES/DRESSINGS) ×2 IMPLANT
COVER SURGICAL LIGHT HANDLE (MISCELLANEOUS) ×4 IMPLANT
CUFF TOURN SGL QUICK 18X4 (TOURNIQUET CUFF) ×2 IMPLANT
DRAIN CHANNEL 15F RND FF 3/16 (WOUND CARE) ×1 IMPLANT
DRSG PAD ABDOMINAL 8X10 ST (GAUZE/BANDAGES/DRESSINGS) ×2 IMPLANT
ELECT REM PT RETURN 15FT ADLT (MISCELLANEOUS) ×2 IMPLANT
EVACUATOR SILICONE 100CC (DRAIN) ×1 IMPLANT
GAUZE SPONGE 4X4 12PLY STRL (GAUZE/BANDAGES/DRESSINGS) ×1 IMPLANT
GAUZE XEROFORM 1X8 LF (GAUZE/BANDAGES/DRESSINGS) ×1 IMPLANT
GLOVE SURG ENC MOIS LTX SZ7 (GLOVE) ×3 IMPLANT
GLOVE SURG UNDER LTX SZ7.5 (GLOVE) ×3 IMPLANT
GOWN STRL REUS W/ TWL LRG LVL3 (GOWN DISPOSABLE) ×2 IMPLANT
GOWN STRL REUS W/TWL LRG LVL3 (GOWN DISPOSABLE) ×4
HANDPIECE INTERPULSE COAX TIP (DISPOSABLE)
IV NS 1000ML (IV SOLUTION) ×2
IV NS 1000ML BAXH (IV SOLUTION) ×1 IMPLANT
KIT BASIN OR (CUSTOM PROCEDURE TRAY) ×2 IMPLANT
KIT TURNOVER KIT A (KITS) ×2 IMPLANT
MANIFOLD NEPTUNE II (INSTRUMENTS) ×2 IMPLANT
NEEDLE 22X1 1/2 (OR ONLY) (NEEDLE) IMPLANT
NS IRRIG 1000ML POUR BTL (IV SOLUTION) ×2 IMPLANT
PACK ORTHO EXTREMITY (CUSTOM PROCEDURE TRAY) ×2 IMPLANT
PAD ARMBOARD 7.5X6 YLW CONV (MISCELLANEOUS) ×3 IMPLANT
SET HNDPC FAN SPRY TIP SCT (DISPOSABLE) ×1 IMPLANT
SYR CONTROL 10ML LL (SYRINGE) ×1 IMPLANT
TOWEL OR 17X26 10 PK STRL BLUE (TOWEL DISPOSABLE) ×2 IMPLANT

## 2020-12-27 NOTE — Progress Notes (Signed)
The pt has remained npo after midnight for potential surgery in the am.

## 2020-12-27 NOTE — Transfer of Care (Signed)
Immediate Anesthesia Transfer of Care Note  Patient: Carl [name]  Procedure(s) Performed: Procedure(s): LEFT TRANSMETATARSAL AMPUTATION (Left)  Patient Location: PACU   Anesthesia Type:MAC  Level of Consciousness: awake, alert  and oriented  Airway & Oxygen Therapy: Patient Spontanous Breathing and Patient connected to nasal cannula oxygen  Post-op Assessment: Report given to RN and Post -op Vital signs reviewed and stable  Post vital signs: Reviewed and stable  Last Vitals:  Vitals:   12/27/20 1437 12/27/20 1512  BP: 138/87 (!) 167/88  Pulse: 72 71  Resp: 19 18  Temp: 36.6 C 36.4 C  SpO2: 63% 016%    Complications: No apparent anesthesia complications

## 2020-12-27 NOTE — Progress Notes (Signed)
PROGRESS NOTE    [name]  SPQ:[id number] DOB: 1965/04/23 DOA: 12/24/2020 PCP: Alicia Amel, MD   Brief Narrative: Carl [name] is a 55 y.o. male with a history of diabetes mellitus type 2, hypertension, hyperlipidemia. Patient presented secondary to a non-healing wound with evidence of an active foot infection and concern for possible osteomyelitis. Antibiotics initiated. Podiatry consulted   Assessment & Plan:   Active Problems:   BMI 40.0-44.9, adult (HCC)   Hyperlipidemia   Hypertension   Type 2 diabetes mellitus with foot ulcer (HCC)   Diabetic foot infection (HCC)   Osteomyelitis of second toe of left foot (HCC)   Acute osteomyelitis of metatarsal bone of left foot (HCC)   Diabetic foot infection Left foot osteomyelitis Foot x-ray without evidence of osteomyelitis. No fevers or leukocytosis. Podiatry consulted on admission. MRI confirms osteomyelitis of distal/middle phalanx of left second toe in addition to osteomyelitis affecting the left third metatarsal bone. Blood cultures with no growth to date. -Continue Vancomycin/Ceftriaxone -Podiatry recommendations: plan for transmetatarsal amputation today 10/17, partial weightbearing to heel with a Darco wedge shoe  Diabetes mellitus, type 2 Hemoglobin A1C of 7.2%. Patient has been managing with diet changes and metformin. States he ran out of glipizide and has not been using it. Metformin discontinued while inpatient. -Continue SSI  Primary hypertension Hypertensive urgency Patient is on losartan 50 mg daily as an outpatient -Continue losartan 50 mg and amlodipine 10 mg daily -Hydralazine prn  Hyperlipidemia -Continue Lipitor  Obesity Body mass index is 35.87 kg/m.   DVT prophylaxis: Heparin subq Code Status:   Code Status: Full Code Family Communication: None at bedside Disposition Plan: Discharge home likely in several days pending podiatry recommendations/management, transition to oral  antibiotics   Consultants:  Podiatry  Procedures:  None  Antimicrobials: Vancomycin Ceftriaxone    Subjective: No issues overnight. News about need for transmetatarsal amputation was hard for him to reconcile. Doing better today, though. Motivated to do what needs to be done and work on recovery.  Objective: Vitals:   12/26/20 1427 12/26/20 2034 12/27/20 0408 12/27/20 1057  BP: (!) 162/89 (!) 172/87 (!) 154/75 (!) 153/86  Pulse: 80 77 69 68  Resp: 20 16 20    Temp: 97.6 F (36.4 C) 98.1 F (36.7 C) (!) 97.5 F (36.4 C)   TempSrc: Oral Oral Oral   SpO2: 99% 98% 96%   Weight:      Height:       No intake or output data in the 24 hours ending 12/27/20 1303  Filed Weights   12/24/20 1813  Weight: 113.4 kg    Examination:  General exam: Appears calm and comfortable Respiratory system: Clear to auscultation. Respiratory effort normal. Cardiovascular system: S1 & S2 heard, RRR. No murmurs, rubs, gallops or clicks. Gastrointestinal system: Abdomen is nondistended, soft and nontender. No organomegaly or masses felt. Normal bowel sounds heard. Central nervous system: Alert and oriented. No focal neurological deficits. Musculoskeletal: No edema. No calf tenderness Skin: No cyanosis. Psychiatry: Judgement and insight appear normal. Mood & affect appropriate.     Data Reviewed: I have personally reviewed following labs and imaging studies  CBC Lab Results  Component Value Date   WBC 7.1 12/25/2020   RBC 4.38 12/25/2020   HGB 12.1 (L) 12/25/2020   HCT 37.4 (L) 12/25/2020   MCV 85.4 12/25/2020   MCH 27.6 12/25/2020   PLT 190 12/25/2020   MCHC 32.4 12/25/2020   RDW 14.6 12/25/2020   LYMPHSABS 1.8  12/24/2020   MONOABS 0.6 12/24/2020   EOSABS 0.3 12/24/2020   BASOSABS 0.0 12/24/2020     Last metabolic panel Lab Results  Component Value Date   NA 137 12/25/2020   K 3.8 12/25/2020   CL 102 12/25/2020   CO2 25 12/25/2020   BUN 12 12/25/2020   CREATININE 0.53  (L) 12/27/2020   GLUCOSE 156 (H) 12/25/2020   GFRNONAA >60 12/27/2020   GFRAA >89 08/17/2015   CALCIUM 8.6 (L) 12/25/2020   PROT 7.7 12/24/2020   ALBUMIN 4.1 12/24/2020   BILITOT <0.1 (L) 12/24/2020   ALKPHOS 74 12/24/2020   AST 21 12/24/2020   ALT 25 12/24/2020   ANIONGAP 10 12/25/2020    CBG (last 3)  Recent Labs    12/27/20 0410 12/27/20 0741 12/27/20 1157  GLUCAP 162* 184* 148*      GFR: Estimated Creatinine Clearance: 130.1 mL/min (A) (by C-G formula based on SCr of 0.53 mg/dL (L)).  Coagulation Profile: Recent Labs  Lab 12/24/20 2118  INR 1.0     Recent Results (from the past 240 hour(s))  Resp Panel by RT-PCR (Flu A&B, Covid) Nasopharyngeal Swab     Status: None   Collection Time: 12/24/20  9:02 PM   Specimen: Nasopharyngeal Swab; Nasopharyngeal(NP) swabs in vial transport medium  Result Value Ref Range Status   SARS Coronavirus 2 by RT PCR NEGATIVE NEGATIVE Final    Comment: (NOTE) SARS-CoV-2 target nucleic acids are NOT DETECTED.  The SARS-CoV-2 RNA is generally detectable in upper respiratory specimens during the acute phase of infection. The lowest concentration of SARS-CoV-2 viral copies this assay can detect is 138 copies/mL. A negative result does not preclude SARS-Cov-2 infection and should not be used as the sole basis for treatment or other patient management decisions. A negative result may occur with  improper specimen collection/handling, submission of specimen other than nasopharyngeal swab, presence of viral mutation(s) within the areas targeted by this assay, and inadequate number of viral copies(<138 copies/mL). A negative result must be combined with clinical observations, patient history, and epidemiological information. The expected result is Negative.  Fact Sheet for Patients:  BloggerCourse.com  Fact Sheet for Healthcare Providers:  SeriousBroker.it  This test is no t yet  approved or cleared by the [place] FDA and  has been authorized for detection and/or diagnosis of SARS-CoV-2 by FDA under an Emergency Use Authorization (EUA). This EUA will remain  in effect (meaning this test can be used) for the duration of the COVID-19 declaration under Section 564(b)(1) of the Act, 21 U.S.C.section 360bbb-3(b)(1), unless the authorization is terminated  or revoked sooner.       Influenza A by PCR NEGATIVE NEGATIVE Final   Influenza B by PCR NEGATIVE NEGATIVE Final    Comment: (NOTE) The Xpert Xpress SARS-CoV-2/FLU/RSV plus assay is intended as an aid in the diagnosis of influenza from Nasopharyngeal swab specimens and should not be used as a sole basis for treatment. Nasal washings and aspirates are unacceptable for Xpert Xpress SARS-CoV-2/FLU/RSV testing.  Fact Sheet for Patients: BloggerCourse.com  Fact Sheet for Healthcare Providers: SeriousBroker.it  This test is not yet approved or cleared by the [place] FDA and has been authorized for detection and/or diagnosis of SARS-CoV-2 by FDA under an Emergency Use Authorization (EUA). This EUA will remain in effect (meaning this test can be used) for the duration of the COVID-19 declaration under Section 564(b)(1) of the Act, 21 U.S.C. section 360bbb-3(b)(1), unless the authorization is terminated or revoked.  Performed  at [facility], [address], [place], [place] [address]   Blood Culture (routine x 2)     Status: None (Preliminary result)   Collection Time: 12/24/20  9:16 PM   Specimen: BLOOD  Result Value Ref Range Status   Specimen Description   Final    BLOOD BLOOD RIGHT HAND Performed at [facility], [address] [place], [place] [address]    Special Requests   Final    BOTTLES DRAWN AEROBIC AND ANAEROBIC Blood Culture results may not be optimal due to an excessive volume of blood received in  culture bottles Performed at [facility], [address], [place], [place] [address]    Culture   Final    NO GROWTH 2 DAYS Performed at [facility], [address], [place], [place] [address]    Report Status PENDING  Incomplete  Blood Culture (routine x 2)     Status: None (Preliminary result)   Collection Time: 12/25/20  5:16 AM   Specimen: BLOOD  Result Value Ref Range Status   Specimen Description   Final    BLOOD LEFT ANTECUBITAL Performed at [facility], [address], [place], [place] [address]    Special Requests   Final    BOTTLES DRAWN AEROBIC ONLY Blood Culture adequate volume Performed at [facility], [address], [place], [place] [address]    Culture   Final    NO GROWTH 2 DAYS Performed at [facility], [address], [place], [place] [address]    Report Status PENDING  Incomplete         Radiology Studies: MRI Left foot with and without contrast  Result Date: 12/25/2020 CLINICAL DATA:  Foot swelling, diabetic, osteomyelitis suspected, xray done EXAM: MRI OF THE LEFT FOREFOOT WITHOUT AND WITH CONTRAST TECHNIQUE: Multiplanar, multisequence MR imaging of the left forefoot was performed both before and after administration of intravenous contrast. CONTRAST:  57mL GADAVIST GADOBUTROL 1 MMOL/ML IV SOLN COMPARISON:  X-ray 12/24/2020 FINDINGS: Bones/Joint/Cartilage Patient is status post amputation of the third ray at the level of the proximal metatarsal diaphysis. There is marked bone marrow edema with confluent low T1 marrow signal and fragmentation of the residual third metatarsal extending nearly to the level of the articular surface (series 6, images 11-12). Findings are compatible with acute osteomyelitis. Flexion deformity of the second toe. There is destruction of the distal phalanx of the second toe. Prominent bone marrow edema within the middle phalanx of the second toe with intermediate  signal intensity. Findings are compatible with acute osteomyelitis. Marked bone marrow edema throughout the fourth metatarsal with confluent low T1 signal changes extending from the fourth metatarsal base to the fourth metatarsal neck (series 6, image 15), compatible with acute osteomyelitis. Mild bone marrow edema within the second metatarsal and second toe proximal phalanx with preserved T1 marrow signal, likely reactive osteitis. Mild patchy bone marrow edema within the midfoot, also likely reactive. Ligaments Intact Lisfranc ligament.  No evidence of acute ligamentous injury. Muscles and Tendons Extensive changes of denervation and myositis. No focal tenosynovial fluid collection. Soft tissues Marked diffuse soft tissue swelling and enhancement of the forefoot, likely representing cellulitis. There is scattered foci of susceptibility artifact in the region of the resection bed of the third ray, which may reflect soft tissue gas or micrometallic debris. No deep soft tissue ulceration. No organized or rim enhancing fluid collections. IMPRESSION: 1. Acute osteomyelitis of the  residual third metatarsal bone. 2. Acute osteomyelitis of the distal phalanx and middle phalanx of the second toe. 3. Acute osteomyelitis throughout the fourth metatarsal. 4. Mild bone marrow edema within the second metatarsal and second toe proximal phalanx, likely reactive osteitis. 5. Marked diffuse soft tissue swelling and enhancement of the forefoot, likely representing cellulitis. There is scattered foci of susceptibility artifact in the region of the resection bed of the third ray, which may reflect soft tissue gas or micrometallic debris. No organized or rim-enhancing fluid collections. Electronically Signed   By: Duanne Guess D.O.   On: 12/25/2020 18:32        Scheduled Meds:  (feeding supplement) PROSource Plus  30 mL Oral BID BM   amLODipine  10 mg Oral Daily   aspirin EC  81 mg Oral Daily   atorvastatin  20 mg Oral  Daily   feeding supplement (GLUCERNA SHAKE)  237 mL Oral BID BM   heparin  5,000 Units Subcutaneous Q8H   insulin aspart  0-20 Units Subcutaneous Q4H   losartan  50 mg Oral Daily   metroNIDAZOLE  500 mg Oral Q12H   multivitamin with minerals  1 tablet Oral Daily   nutrition supplement (JUVEN)  1 packet Oral BID BM   sodium chloride flush  3 mL Intravenous Q12H   Continuous Infusions:  cefTRIAXone (ROCEPHIN)  IV 4 g (12/26/20 2053)   vancomycin 1,250 mg (12/27/20 1102)     LOS: 3 days     Jacquelin Hawking, MD [facility] 12/27/2020, 1:03 PM  If 7PM-7AM, please contact night-coverage [email or web address]

## 2020-12-27 NOTE — Brief Op Note (Signed)
12/27/2020  6:07 PM  PATIENT:  Carl [name]  55 y.o. male  PRE-OPERATIVE DIAGNOSIS:  Left foot abscess  POST-OPERATIVE DIAGNOSIS:  Left foot abscess  PROCEDURE:  Procedure(s): LEFT TRANSMETATARSAL AMPUTATION (Left)  SURGEON:  Surgeon(s) and Role:    * Lorenda Peck, MD - Primary    * Stover, Lady Saucier, DPM - Assisting  PHYSICIAN ASSISTANT:   ASSISTANTS: none   ANESTHESIA:   MAC  EBL:  10 mL   BLOOD ADMINISTERED:none  DRAINS: (15) Jackson-Pratt drain(s) with closed bulb suction in the bulb    LOCAL MEDICATIONS USED:  MARCAINE    and XYLOCAINE   SPECIMEN:  Source of Specimen:  Left forefoot  DISPOSITION OF SPECIMEN:  PATHOLOGY  COUNTS:  YES  TOURNIQUET:   Total Tourniquet Time Documented: Calf (Left) - 43 minutes Total: Calf (Left) - 43 minutes   DICTATION: .Note written in EPIC  PLAN OF CARE: Admit to inpatient   PATIENT DISPOSITION:  PACU - hemodynamically stable.   Delay start of Pharmacological VTE agent (>24hrs) due to surgical blood loss or risk of bleeding: no

## 2020-12-27 NOTE — Op Note (Signed)
DATE: 12/27/2020   SURGEON: Louann Sjogren, DPM  PREOPERATIVE DIAGNOSIS: Cellulitis, left second toe ulcer with osteomyelitis. Osteomyelitis to third and fourth metatarsals. DFI      POSTOPERATIVE DIAGNOSIS: Same     PROCEDURE PERFORMED:Left TMA    HEMOSTASIS:  Left ankle tourniquet     ESTIMATED BLOOD LOSS:  Minimal     ANESTHESIA:  General with local 20cc of 1:1 mixture 1% lidocaine and 0.5% marcaine plain     SPECIMENS: Bone culture - pathology    COMPLICATIONS:  None.     INDICATIONS FOR PROCEDURE:  This patient is a pleasant 55 y.o. Male who has been hospitalized for infected amputation site and 2nd digit ulcer with infection and osteomyelitis.   Patient opt for amputation. Risks and complications include but are not limited to infection, recurrence of symptoms, pain, numbness, wound dehiscence, delayed healing, as well as need for future surgery/further amputation.  No guarantees were given or applied.  All questions were answered to the patient's satisfaction, and the patient has consented to the above procedure.  All preoperative labs and H&P, medical clearances have been obtained and NPO status has been confirmed.  PROCEDURE IN DETAIL: After suitable general anesthesia, the patient's left lower extremity, with a tourniquet cuff in place, was prepped and draped in the usual sterile fashion. Attention was directed to the operative extremity with visible site marking. Incision was planned with skin marker. Dorsal incision was then made with a 15 blade through skin and subcutaneous tissue down to bone. Electrocautery was used to ligate any bleeders. Incision was then made along the plantar aspect of the foot careful to ligate any bleeders with electrocautery. Next, the bony cuts were made using sagittal saw, and these were made approximately at the mid level of the metatarsals and in a cascade fashion, transecting all of the 5 metatarsal bones. Next, the amputated  forefoot was then removed. Following this, the wound was thoroughly irrigated and then clean area of bone was biopsied for culture. Hemostasis was then obtained After full hemostasis had been obtained, the wound was again thoroughly irrigated and then the dorsal plantar flap was further tailored so that the plantar flap was lying in a dorsal direction. The skin  was closed with interrupted sutures using 3-0 Nylon in a vertical and horizontal mattress fashion.JP drain was inserted and had 25 ml present after closure  A Xeroform and a bulky foot dressing were applied, and the patient awakened and transferred to the recovery room in a stable condition.  DISPO: Patient to return to medical floor. Continue IV Abx.  Louann Sjogren, MD

## 2020-12-27 NOTE — Anesthesia Preprocedure Evaluation (Addendum)
Anesthesia Evaluation  Patient identified by MRN, date of birth, ID band Patient awake    Reviewed: Allergy & Precautions, NPO status , Patient's Chart, lab work & pertinent test results  History of Anesthesia Complications Negative for: history of anesthetic complications  Airway Mallampati: II  TM Distance: >3 FB Neck ROM: Full    Dental no notable dental hx. (+) Teeth Intact, Dental Advisory Given   Pulmonary former smoker,  09/21/2020 SARS coronavirus NEG   Pulmonary exam normal breath sounds clear to auscultation       Cardiovascular hypertension, Pt. on medications (-) anginaNormal cardiovascular exam Rhythm:Regular Rate:Normal     Neuro/Psych H/o Bell's palsy R    GI/Hepatic negative GI ROS, Neg liver ROS,   Endo/Other  diabetes, Oral Hypoglycemic AgentsMorbid obesity  Renal/GU negative Renal ROS     Musculoskeletal   Abdominal (+) + obese,   Peds  Hematology negative hematology ROS (+)   Anesthesia Other Findings   Reproductive/Obstetrics                           Anesthesia Physical  Anesthesia Plan  ASA: 3  Anesthesia Plan: MAC and General   Post-op Pain Management:    Induction: Intravenous  PONV Risk Score and Plan: 1 and Ondansetron, Treatment may vary due to age or medical condition and Propofol infusion  Airway Management Planned:   Additional Equipment: None  Intra-op Plan:   Post-operative Plan:   Informed Consent: I have reviewed the patients History and Physical, chart, labs and discussed the procedure including the risks, benefits and alternatives for the proposed anesthesia with the patient or authorized representative who has indicated his/her understanding and acceptance.     Dental advisory given  Plan Discussed with: CRNA  Anesthesia Plan Comments: (Mac vs LMA)      Anesthesia Quick Evaluation

## 2020-12-27 NOTE — Progress Notes (Signed)
Pharmacy Antibiotic Note  Carl [name] is a 55 y.o. male admitted on 12/24/2020 with non-healing  diabetic foot wound .  MRI shows OM. Pharmacy has been consulted for Vancomycin dosing.  Podiatry is following - planning for transmetatarsal amputation today.   Today, 12/27/20 WBC WNL SCr remains stable, WNL  Today is day #4 of broad spectrum IV antibiotics.  Plan: Continue vancomycin 1250 mg IV q12h for estimated AUC of 477 Goal vancomycin AUC: 400-550. Check levels at steady state as needed CTX + metronidazole per MD. Doses appropriate. Follow renal function, culture data.    Height: 5\' 10"  (177.8 cm) Weight: 113.4 kg (250 lb) IBW/kg (Calculated) : 73  Temp (24hrs), Avg:97.7 F (36.5 C), Min:97.5 F (36.4 C), Max:98.1 F (36.7 C)  Recent Labs  Lab 12/24/20 2118 12/25/20 0516 12/27/20 0517  WBC 7.4 7.1  --   CREATININE 0.46* 0.48* 0.53*  LATICACIDVEN 1.0  --   --      Estimated Creatinine Clearance: 130.1 mL/min (A) (by C-G formula based on SCr of 0.53 mg/dL (L)).    Allergies  Allergen Reactions   Morphine And Related Other (See Comments)    hallucinations    Antimicrobials this admission: 10/14 Vancomycin >>  10/14 Rocephin >>  10/14 Metronidazole>>  Dose adjustments this admission:  Microbiology results: 10/14 BCx: ngtd  Previous data: 8/19 Wound Cx: Enterobacter cloacae- sens CTX 7/17 Wound Cx: S. Anginosis- sens CTX 7/17 L foot abscess cx: S. Anginosis + Proteus penneri- sens CTX  Thank you for allowing pharmacy to be a part of this patient's care.  8/17 [facility] PharmD 12/27/2020 11:02 AM

## 2020-12-28 ENCOUNTER — Encounter (HOSPITAL_COMMUNITY): Payer: Self-pay | Admitting: Podiatry

## 2020-12-28 LAB — GLUCOSE, CAPILLARY
Glucose-Capillary: 174 mg/dL — ABNORMAL HIGH (ref 70–99)
Glucose-Capillary: 145 mg/dL — ABNORMAL HIGH (ref 70–99)
Glucose-Capillary: 159 mg/dL — ABNORMAL HIGH (ref 70–99)
Glucose-Capillary: 157 mg/dL — ABNORMAL HIGH (ref 70–99)
Glucose-Capillary: 223 mg/dL — ABNORMAL HIGH (ref 70–99)
Glucose-Capillary: 173 mg/dL — ABNORMAL HIGH (ref 70–99)

## 2020-12-28 MED ORDER — SODIUM CHLORIDE 0.9 % IV SOLN
INTRAVENOUS | Status: DC | PRN
  Administered 2020-12-28: 250 mL via INTRAVENOUS

## 2020-12-28 NOTE — Progress Notes (Signed)
Please call Irving Burton pt daughter w/ daily updates at [phone].

## 2020-12-28 NOTE — Progress Notes (Signed)
PROGRESS NOTE    Carl [name]  ZOX:[id number] DOB: [birth year] DOA: 12/24/2020 PCP: Alicia Amel, MD   Brief Narrative: Carl [name] is a 55 y.o. male with a history of diabetes mellitus type 2, hypertension, hyperlipidemia. Patient presented secondary to a non-healing wound with evidence of an active foot infection and concern for possible osteomyelitis. Antibiotics initiated. Podiatry consulted   Assessment & Plan:   Active Problems:   BMI 40.0-44.9, adult (HCC)   Hyperlipidemia   Hypertension   Type 2 diabetes mellitus with foot ulcer (HCC)   Diabetic foot infection (HCC)   Osteomyelitis of second toe of left foot (HCC)   Acute osteomyelitis of metatarsal bone of left foot (HCC)   Diabetic foot infection Left foot osteomyelitis Foot x-ray without evidence of osteomyelitis. No fevers or leukocytosis. Podiatry consulted on admission. MRI confirms osteomyelitis of distal/middle phalanx of left second toe in addition to osteomyelitis affecting the left third metatarsal bone. Blood cultures with no growth to date. 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 Patient has been managing with diet changes and metformin. States he ran out of glipizide and has not been using it. Metformin discontinued while inpatient. -Continue SSI  Primary hypertension Hypertensive urgency Patient is on losartan 50 mg daily as an outpatient -Continue losartan 50 mg and amlodipine 10 mg daily -Hydralazine prn  Hyperlipidemia -Continue Lipitor  Obesity Body mass index is 35.87 kg/m.   DVT prophylaxis: Heparin subq Code Status:   Code Status: Full Code Family Communication: None at bedside Disposition Plan: Discharge home likely in several days pending podiatry recommendations/management,  transition to oral antibiotics   Consultants:  Podiatry  Procedures:  None  Antimicrobials: Vancomycin Ceftriaxone    Subjective: No issues this morning. Motivated to recover well after amputation.  Objective: Vitals:   12/27/20 2053 12/28/20 0359 12/28/20 1037 12/28/20 1401  BP: 116/71 130/73 135/75 (!) 143/85  Pulse: 80 77 75 86  Resp: 20 20  20   Temp: 97.8 F (36.6 C) 97.7 F (36.5 C) 98.2 F (36.8 C) 97.9 F (36.6 C)  TempSrc: Oral Oral Oral Oral  SpO2: 96% 98%  100%  Weight:      Height:        Intake/Output Summary (Last 24 hours) at 12/28/2020 1447 Last data filed at 12/28/2020 1045 Gross per 24 hour  Intake 1620 ml  Output 980 ml  Net 640 ml    Filed Weights   12/24/20 1813 12/27/20 1518  Weight: 113.4 kg 113.4 kg    Examination:  General exam: Appears calm and comfortable Respiratory system: Clear to auscultation. Respiratory effort normal. Cardiovascular system: S1 & S2 heard, RRR. No murmurs, rubs, gallops or clicks. Gastrointestinal system: Abdomen is nondistended, soft and nontender. No organomegaly or masses felt. Normal bowel sounds heard. Central nervous system: Alert and oriented. No focal neurological deficits. Musculoskeletal: No edema. No calf tenderness. Left foot in bandage with drain in place. Drain with bloody fluid. Skin: No cyanosis. No rashes Psychiatry: Judgement and insight appear normal. Mood & affect appropriate.     Data Reviewed: I have personally reviewed following labs and imaging studies  CBC Lab Results  Component Value Date   WBC 7.1 12/25/2020   RBC 4.38 12/25/2020   HGB 12.1 (L) 12/25/2020   HCT 37.4 (L) 12/25/2020   MCV 85.4 12/25/2020   MCH 27.6 12/25/2020  PLT 190 12/25/2020   MCHC 32.4 12/25/2020   RDW 14.6 12/25/2020   LYMPHSABS 1.8 12/24/2020   MONOABS 0.6 12/24/2020   EOSABS 0.3 12/24/2020   BASOSABS 0.0 12/24/2020     Last metabolic panel Lab Results  Component Value Date   NA 137  12/25/2020   K 3.8 12/25/2020   CL 102 12/25/2020   CO2 25 12/25/2020   BUN 12 12/25/2020   CREATININE 0.53 (L) 12/27/2020   GLUCOSE 156 (H) 12/25/2020   GFRNONAA >60 12/27/2020   GFRAA >89 08/17/2015   CALCIUM 8.6 (L) 12/25/2020   PROT 7.7 12/24/2020   ALBUMIN 4.1 12/24/2020   BILITOT <0.1 (L) 12/24/2020   ALKPHOS 74 12/24/2020   AST 21 12/24/2020   ALT 25 12/24/2020   ANIONGAP 10 12/25/2020    CBG (last 3)  Recent Labs    12/28/20 0401 12/28/20 0801 12/28/20 1157  GLUCAP 174* 145* 159*      GFR: Estimated Creatinine Clearance: 130.1 mL/min (A) (by C-G formula based on SCr of 0.53 mg/dL (L)).  Coagulation Profile: Recent Labs  Lab 12/24/20 2118  INR 1.0     Recent Results (from the past 240 hour(s))  Resp Panel by RT-PCR (Flu A&B, Covid) Nasopharyngeal Swab     Status: None   Collection Time: 12/24/20  9:02 PM   Specimen: Nasopharyngeal Swab; Nasopharyngeal(NP) swabs in vial transport medium  Result Value Ref Range Status   SARS Coronavirus 2 by RT PCR NEGATIVE NEGATIVE Final    Comment: (NOTE) SARS-CoV-2 target nucleic acids are NOT DETECTED.  The SARS-CoV-2 RNA is generally detectable in upper respiratory specimens during the acute phase of infection. The lowest concentration of SARS-CoV-2 viral copies this assay can detect is 138 copies/mL. A negative result does not preclude SARS-Cov-2 infection and should not be used as the sole basis for treatment or other patient management decisions. A negative result may occur with  improper specimen collection/handling, submission of specimen other than nasopharyngeal swab, presence of viral mutation(s) within the areas targeted by this assay, and inadequate number of viral copies(<138 copies/mL). A negative result must be combined with clinical observations, patient history, and epidemiological information. The expected result is Negative.  Fact Sheet for Patients:   BloggerCourse.com  Fact Sheet for Healthcare Providers:  SeriousBroker.it  This test is no t yet approved or cleared by the [place] FDA and  has been authorized for detection and/or diagnosis of SARS-CoV-2 by FDA under an Emergency Use Authorization (EUA). This EUA will remain  in effect (meaning this test can be used) for the duration of the COVID-19 declaration under Section 564(b)(1) of the Act, 21 U.S.C.section 360bbb-3(b)(1), unless the authorization is terminated  or revoked sooner.       Influenza A by PCR NEGATIVE NEGATIVE Final   Influenza B by PCR NEGATIVE NEGATIVE Final    Comment: (NOTE) The Xpert Xpress SARS-CoV-2/FLU/RSV plus assay is intended as an aid in the diagnosis of influenza from Nasopharyngeal swab specimens and should not be used as a sole basis for treatment. Nasal washings and aspirates are unacceptable for Xpert Xpress SARS-CoV-2/FLU/RSV testing.  Fact Sheet for Patients: BloggerCourse.com  Fact Sheet for Healthcare Providers: SeriousBroker.it  This test is not yet approved or cleared by the [place] FDA and has been authorized for detection and/or diagnosis of SARS-CoV-2 by FDA under an Emergency Use Authorization (EUA). This EUA will remain in effect (meaning this test can be used) for the duration of the COVID-19 declaration under Section  564(b)(1) of the Act, 21 U.S.C. section 360bbb-3(b)(1), unless the authorization is terminated or revoked.  Performed at [facility], [address], [place], [place] [address]   Blood Culture (routine x 2)     Status: None (Preliminary result)   Collection Time: 12/24/20  9:16 PM   Specimen: BLOOD  Result Value Ref Range Status   Specimen Description   Final    BLOOD BLOOD RIGHT HAND Performed at [facility], [address] [place], [place] [address]     Special Requests   Final    BOTTLES DRAWN AEROBIC AND ANAEROBIC Blood Culture results may not be optimal due to an excessive volume of blood received in culture bottles Performed at [facility], [address], [place], [place] [address]    Culture   Final    NO GROWTH 3 DAYS Performed at [facility], [address] [place], [place] [address]    Report Status PENDING  Incomplete  Blood Culture (routine x 2)     Status: None (Preliminary result)   Collection Time: 12/25/20  5:16 AM   Specimen: BLOOD  Result Value Ref Range Status   Specimen Description   Final    BLOOD LEFT ANTECUBITAL Performed at [facility], [address], [place], [place] [address]    Special Requests   Final    BOTTLES DRAWN AEROBIC ONLY Blood Culture adequate volume Performed at [facility], [address], [place], [place] [address]    Culture   Final    NO GROWTH 3 DAYS Performed at [facility], [address] [place], [place] [address]    Report Status PENDING  Incomplete  Aerobic/Anaerobic Culture w Gram Stain (surgical/deep wound)     Status: None (Preliminary result)   Collection Time: 12/27/20  5:36 PM   Specimen: Wound  Result Value Ref Range Status   Specimen Description   Final    WOUND FOOT LEFT Performed at [facility], [address] [place], [place] [address]    Special Requests   Final    NONE Performed at [facility], [address] [place], [place] [address]    Gram Stain NO WBC SEEN NO ORGANISMS SEEN   Final   Culture   Final    NO GROWTH < 24 HOURS Performed at [facility], [address] [place], [place] [address]    Report Status PENDING  Incomplete  Aerobic/Anaerobic Culture w Gram Stain (surgical/deep wound)     Status: None (Preliminary result)   Collection Time: 12/27/20  5:36 PM   Specimen: Soft Tissue, Other  Result Value Ref Range Status    Specimen Description   Final    TISSUE FOOT LEFT Performed at [facility], [address], [place], [place] [address]    Special Requests   Final    NONE Performed at [facility], [address] [place], [place] [address]    Gram Stain   Final    RARE WBC PRESENT, PREDOMINANTLY MONONUCLEAR NO ORGANISMS SEEN    Culture   Final    NO GROWTH < 24 HOURS Performed at [facility], [address] [place], [place] [address]    Report Status PENDING  Incomplete         Radiology Studies: DG Foot Complete Left  Result Date: 12/27/2020 CLINICAL DATA:  Follow-up amputation EXAM: LEFT FOOT -  COMPLETE 3+ VIEW COMPARISON:  12/24/2020 FINDINGS: Three views show amputation of the forefoot at the proximal metatarsal level. Soft tissue drain in place. No complicating feature by radiography. IMPRESSION: Amputation of the forefoot at the proximal metatarsal level. Electronically Signed   By: Paulina Fusi M.D.   On: 12/27/2020 19:35        Scheduled Meds:  (feeding supplement) PROSource Plus  30 mL Oral BID BM   amLODipine  10 mg Oral Daily   aspirin EC  81 mg Oral Daily   atorvastatin  20 mg Oral Daily   feeding supplement (GLUCERNA SHAKE)  237 mL Oral BID BM   heparin  5,000 Units Subcutaneous Q8H   insulin aspart  0-20 Units Subcutaneous Q4H   losartan  50 mg Oral Daily   metroNIDAZOLE  500 mg Oral Q12H   multivitamin with minerals  1 tablet Oral Daily   nutrition supplement (JUVEN)  1 packet Oral BID BM   sodium chloride flush  3 mL Intravenous Q12H   Continuous Infusions:  cefTRIAXone (ROCEPHIN)  IV Stopped (12/28/20 0001)   vancomycin 1,250 mg (12/28/20 1042)     LOS: 4 days     Jacquelin Hawking, MD [facility] 12/28/2020, 2:47 PM  If 7PM-7AM, please contact night-coverage [email or web address]

## 2020-12-28 NOTE — Progress Notes (Signed)
  Subjective:  Patient ID: Carl [name], male    DOB: 11-26-1965,  MRN: [id number]  Mr. Crook seen at bedside this morning. States he is doing well. Relates some bleeding last night and drain was changed multiple times. No nausea, vomiting, fever or chills. Minimal pain.   Past Medical History:  Diagnosis Date   Diabetes mellitus without complication (HCC)    Hyperlipidemia    Hypertension    Obesity      Past Surgical History:  Procedure Laterality Date   AMPUTATION TOE Left 09/22/2020   HAND SURGERY     INCISION AND DRAINAGE Left 09/22/2020   Procedure: INCISION AND DRAINAGE LEFT FOOT WITH REMOVAL OF ALL NON VIABLE SOFT TISSUE AND BONE INCLUDING THRID TOE AMPUTATION;  Surgeon: Park Liter, DPM;  Location: [facility] OR;  Service: Podiatry;  Laterality: Left;   VASECTOMY     WOUND DEBRIDEMENT Left 09/26/2020   Procedure: DEBRIDEMENT WOUND LEFT FOOT;  Surgeon: Park Liter, DPM;  Location: [facility] OR;  Service: Podiatry;  Laterality: Left;    CBC Latest Ref Rng & Units 12/25/2020 12/24/2020 09/28/2020  WBC 4.0 - 10.5 K/uL 7.1 7.4 8.8  Hemoglobin 13.0 - 17.0 g/dL 12.1(L) 12.3(L) 11.0(L)  Hematocrit 39.0 - 52.0 % 37.4(L) 37.7(L) 32.7(L)  Platelets 150 - 400 K/uL 190 232 283    BMP Latest Ref Rng & Units 12/27/2020 12/25/2020 12/24/2020  Glucose 70 - 99 mg/dL - 694(W) 546(E)  BUN 6 - 20 mg/dL - 12 15  Creatinine 7.03 - 1.24 mg/dL 5.00(X) 3.81(W) 2.99(B)  BUN/Creat Ratio 9 - 20 - - -  Sodium 135 - 145 mmol/L - 137 136  Potassium 3.5 - 5.1 mmol/L - 3.8 3.8  Chloride 98 - 111 mmol/L - 102 102  CO2 22 - 32 mmol/L - 25 25  Calcium 8.9 - 10.3 mg/dL - 8.6(L) 8.9     Objective:   Vitals:   12/27/20 2053 12/28/20 0359  BP: 116/71 130/73  Pulse: 80 77  Resp: 20 20  Temp: 97.8 F (36.6 C) 97.7 F (36.5 C)  SpO2: 96% 98%   General AA&O x3. Normal mood and affect.  Vascular Dorsalis pedis and posterior tibial pulses 2/4 bilat. Brisk capillary refill to all digits. Pedal hair  present.  Neurologic Epicritic sensation grossly intact.  Dermatologic Dressing in place. Some strikethrough noted. Drain with 25 CC present  Orthopedic: MMT 5/5 in dorsiflexion, plantarflexion, inversion, and eversion. Normal joint ROM without pain or crepitus.    Assessment & Plan:  Patient was evaluated and treated and all questions answered.   -S/p Left Transmetatarsal amputation. POD#1 -Cultures pending. Will follow  -Dressing in place today with some strikethrough noted. May reinforce dressing as needed. Will plan to change dressing POD#2. Drain will be left in place until then.  -Continue with IV abx.  -Non-weightbearing to operative extremity. Will need a post-operative shoe upon discharge.  - Discussed perioperative course with patient and expressed understanding. All questions answered.  -Upon discharge will follow-up in one week to clinic.  -Will continue to follow.   Louann Sjogren, MD  Accessible via secure chat for questions or concerns.

## 2020-12-28 NOTE — TOC Initial Note (Addendum)
Transition of Care [facility]) - Initial/Assessment Note    Patient Details  Name: Carl [name] MRN: [id number] Date of Birth: May 07, 1965  Transition of Care [facility]) CM/SW Contact:    Trish Mage, LCSW Phone Number: 12/28/2020, 10:38 AM  Clinical Narrative:  Met with post surgery patient who has no insurance, currently unable to work, lives alone in [place].  He cites supports of daughter in [place], [place], son and neighbor who all live close by, and girlfriend.  Last time he had surgery he was able to do dressing changes himself, got help with supplies and care from podiatry clinic. I messaged Dr Blenda Mounts who replied that he has no HH needs currently. TOC will continue to follow during the course of hospitalization.  Addendum: [place] voucher on 09/28/20 during last hospitalization                  Expected Discharge Plan: Home/Self Care Barriers to Discharge: No Barriers Identified   Patient Goals and CMS Choice        Expected Discharge Plan and Services Expected Discharge Plan: Home/Self Care   Discharge Planning Services: CM Consult   Living arrangements for the past 2 months: Single Family Home                                      Prior Living Arrangements/Services Living arrangements for the past 2 months: Single Family Home Lives with:: Self Patient language and need for interpreter reviewed:: Yes Do you feel safe going back to the place where you live?: Yes      Need for Family Participation in Patient Care: Yes (Comment) Care giver support system in place?: Yes (comment)   Criminal Activity/Legal Involvement Pertinent to Current Situation/Hospitalization: No - Comment as needed  Activities of Daily Living Home Assistive Devices/Equipment: [place] (specify type) ADL Screening (condition at time of admission) Patient's cognitive ability adequate to safely complete daily activities?: Yes Is the patient deaf or have difficulty hearing?: No Does the patient have  difficulty seeing, even when wearing glasses/contacts?: No Does the patient have difficulty concentrating, remembering, or making decisions?: No Patient able to express need for assistance with ADLs?: Yes Does the patient have difficulty dressing or bathing?: No Independently performs ADLs?: Yes (appropriate for developmental age) Does the patient have difficulty walking or climbing stairs?: Yes Weakness of Legs: Both Weakness of Arms/Hands: None  Permission Sought/Granted                  Emotional Assessment Appearance:: Appears stated age Attitude/Demeanor/Rapport: Engaged Affect (typically observed): Appropriate Orientation: : Oriented to Self, Oriented to Place, Oriented to  Time, Oriented to Situation Alcohol / Substance Use: Not Applicable Psych Involvement: No (comment)  Admission diagnosis:  Diabetic foot infection ([place]) [E72.094, L08.9] Left foot infection [L08.9] Patient Active Problem List   Diagnosis Date Noted   Osteomyelitis of second toe of left foot ([place]) 12/26/2020   Acute osteomyelitis of metatarsal bone of left foot ([place]) 12/26/2020   Healthcare maintenance 10/27/2020   Planned postoperative wound closure    Cellulitis and abscess of toe of left foot    Diabetic foot infection ([place])    Abscess of left foot    Osteomyelitis ([place]) 09/21/2020   Right-sided Bell's palsy 01/20/2015   Type 2 diabetes mellitus with foot ulcer ([place]) 01/19/2015   BMI 40.0-44.9, adult ([place]) 06/12/2011   Hyperlipidemia 06/12/2011   Hypertension  06/12/2011   PCP:  Eppie Gibson, MD Pharmacy:   [facility] [address] [place] - [place] [address] [place] [address] Phone: [phone] Fax: [phone]     Social Determinants of Health (SDOH) Interventions    Readmission Risk Interventions No flowsheet data found.

## 2020-12-29 LAB — GLUCOSE, CAPILLARY
Glucose-Capillary: 160 mg/dL — ABNORMAL HIGH (ref 70–99)
Glucose-Capillary: 147 mg/dL — ABNORMAL HIGH (ref 70–99)
Glucose-Capillary: 192 mg/dL — ABNORMAL HIGH (ref 70–99)
Glucose-Capillary: 224 mg/dL — ABNORMAL HIGH (ref 70–99)
Glucose-Capillary: 176 mg/dL — ABNORMAL HIGH (ref 70–99)
Glucose-Capillary: 211 mg/dL — ABNORMAL HIGH (ref 70–99)

## 2020-12-29 LAB — BASIC METABOLIC PANEL
Anion gap: 9 (ref 5–15)
BUN: 22 mg/dL — ABNORMAL HIGH (ref 6–20)
CO2: 24 mmol/L (ref 22–32)
Calcium: 8.9 mg/dL (ref 8.9–10.3)
Chloride: 101 mmol/L (ref 98–111)
Creatinine, Ser: 0.65 mg/dL (ref 0.61–1.24)
GFR, Estimated: >60 mL/min (ref >60)
Glucose, Bld: 195 mg/dL — ABNORMAL HIGH (ref 70–99)
Potassium: 4.3 mmol/L (ref 3.5–5.1)
Sodium: 134 mmol/L — ABNORMAL LOW (ref 135–145)

## 2020-12-29 LAB — CREATININE, SERUM
Creatinine, Ser: 0.53 mg/dL — ABNORMAL LOW (ref 0.61–1.24)
GFR, Estimated: >60 mL/min

## 2020-12-29 MED ORDER — FLUCONAZOLE 200 MG PO TABS
400.0000 mg | ORAL_TABLET | Freq: Every day | ORAL | Status: DC
  Administered 2020-12-29 – 2020-12-30 (×2): 400 mg via ORAL
  Filled 2020-12-29 (×2): qty 2

## 2020-12-29 NOTE — Progress Notes (Signed)
PROGRESS NOTE    Carl [name]  WNU:[id number] DOB: [birth year] DOA: 12/24/2020 PCP: Alicia Amel, MD   Brief Narrative: Carl [name] is a 55 y.o. male with a history of diabetes mellitus type 2, hypertension, hyperlipidemia. Patient presented secondary to a non-healing wound with evidence of an active foot infection and concern for possible osteomyelitis. Antibiotics initiated. Podiatry consulted   Assessment & Plan:   Active Problems:   BMI 40.0-44.9, adult (HCC)   Hyperlipidemia   Hypertension   Type 2 diabetes mellitus with foot ulcer (HCC)   Diabetic foot infection (HCC)   Osteomyelitis of second toe of left foot (HCC)   Acute osteomyelitis of metatarsal bone of left foot (HCC)   Diabetic foot infection Left foot osteomyelitis Foot x-ray without evidence of osteomyelitis. No fevers or leukocytosis. Podiatry consulted on admission. MRI confirms osteomyelitis of distal/middle phalanx of left second toe in addition to osteomyelitis affecting the left third metatarsal bone. Blood cultures with no growth to date. 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  Postop day #2.  Dressing was changed today with plans to pull the drain tomorrow. - Await podiatry recommendations and clearance for discharge including recommendations for dressing changes, antibiotics at discharge, weightbearing status, DME and follow-ups.  Diabetes mellitus, type 2 Hemoglobin A1C of 7.2%. Patient has been managing with diet changes and metformin. States he ran out of glipizide and has not been using it. Metformin discontinued while inpatient. -Continue SSI.  Fluctuating/labile and mildly uncontrolled.  No change in regimen.  Primary hypertension Hypertensive urgency Patient is on losartan 50 mg daily as an outpatient -Continue losartan 50 mg and amlodipine 10 mg daily -Hydralazine prn -  Controlled.  Hyperlipidemia -Continue Lipitor  Obesity Body mass index is 35.87 kg/m.   DVT prophylaxis: Heparin subq Code Status:   Code Status: Full Code Family Communication: None at bedside Disposition Plan: Discharge home likely in the next 1 to 2 days pending podiatry clearance.   Consultants:  Podiatry  Procedures:  None  Antimicrobials: Vancomycin Ceftriaxone    Subjective: Patient reports that he feels much better.  Little to no pain, mostly during dressing change.  No other complaints reported.  Objective: Vitals:   12/28/20 1401 12/28/20 2001 12/29/20 0336 12/29/20 1053  BP: (!) 143/85 (!) 152/79 124/70 134/80  Pulse: 86 93 82 74  Resp: 20 20 20    Temp: 97.9 F (36.6 C) 98.1 F (36.7 C) 97.6 F (36.4 C)   TempSrc: Oral Oral Oral   SpO2: 100% 98% 99% 94%  Weight:      Height:        Intake/Output Summary (Last 24 hours) at 12/29/2020 1237 Last data filed at 12/29/2020 1200 Gross per 24 hour  Intake 2727.31 ml  Output 3580 ml  Net -852.69 ml   Filed Weights   12/24/20 1813 12/27/20 1518  Weight: 113.4 kg 113.4 kg    Examination:  General exam: Young male, moderately built and obese lying comfortably propped up in bed without distress. Respiratory system: Clear to auscultation.  No increased work of breathing. Cardiovascular system: S1 & S2 heard, RRR. No murmurs, rubs, gallops or clicks. Gastrointestinal system: Abdomen is nondistended, soft and nontender. No organomegaly or masses felt. Normal bowel sounds heard. Central nervous system: Alert and oriented. No focal neurological deficits. Musculoskeletal: No edema. No calf tenderness.  Left foot dressing clean and dry.  Drain with minimal bloody fluid  in bulb. Skin: No cyanosis. No rashes Psychiatry: Judgement and insight appear normal. Mood & affect appropriate.     Data Reviewed: I have personally reviewed following labs and imaging studies  CBC Lab Results  Component Value Date    WBC 7.1 12/25/2020   RBC 4.38 12/25/2020   HGB 12.1 (L) 12/25/2020   HCT 37.4 (L) 12/25/2020   MCV 85.4 12/25/2020   MCH 27.6 12/25/2020   PLT 190 12/25/2020   MCHC 32.4 12/25/2020   RDW 14.6 12/25/2020   LYMPHSABS 1.8 12/24/2020   MONOABS 0.6 12/24/2020   EOSABS 0.3 12/24/2020   BASOSABS 0.0 12/24/2020     Last metabolic panel Lab Results  Component Value Date   NA 137 12/25/2020   K 3.8 12/25/2020   CL 102 12/25/2020   CO2 25 12/25/2020   BUN 12 12/25/2020   CREATININE 0.53 (L) 12/29/2020   GLUCOSE 156 (H) 12/25/2020   GFRNONAA >60 12/29/2020   GFRAA >89 08/17/2015   CALCIUM 8.6 (L) 12/25/2020   PROT 7.7 12/24/2020   ALBUMIN 4.1 12/24/2020   BILITOT <0.1 (L) 12/24/2020   ALKPHOS 74 12/24/2020   AST 21 12/24/2020   ALT 25 12/24/2020   ANIONGAP 10 12/25/2020    CBG (last 3)  Recent Labs    12/29/20 0339 12/29/20 0747 12/29/20 1124  GLUCAP 160* 147* 192*     GFR: Estimated Creatinine Clearance: 130.1 mL/min (A) (by C-G formula based on SCr of 0.53 mg/dL (L)).  Coagulation Profile: Recent Labs  Lab 12/24/20 2118  INR 1.0    Recent Results (from the past 240 hour(s))  Resp Panel by RT-PCR (Flu A&B, Covid) Nasopharyngeal Swab     Status: None   Collection Time: 12/24/20  9:02 PM   Specimen: Nasopharyngeal Swab; Nasopharyngeal(NP) swabs in vial transport medium  Result Value Ref Range Status   SARS Coronavirus 2 by RT PCR NEGATIVE NEGATIVE Final    Comment: (NOTE) SARS-CoV-2 target nucleic acids are NOT DETECTED.  The SARS-CoV-2 RNA is generally detectable in upper respiratory specimens during the acute phase of infection. The lowest concentration of SARS-CoV-2 viral copies this assay can detect is 138 copies/mL. A negative result does not preclude SARS-Cov-2 infection and should not be used as the sole basis for treatment or other patient management decisions. A negative result may occur with  improper specimen collection/handling, submission of  specimen other than nasopharyngeal swab, presence of viral mutation(s) within the areas targeted by this assay, and inadequate number of viral copies(<138 copies/mL). A negative result must be combined with clinical observations, patient history, and epidemiological information. The expected result is Negative.  Fact Sheet for Patients:  BloggerCourse.com  Fact Sheet for Healthcare Providers:  SeriousBroker.it  This test is no t yet approved or cleared by the [place] FDA and  has been authorized for detection and/or diagnosis of SARS-CoV-2 by FDA under an Emergency Use Authorization (EUA). This EUA will remain  in effect (meaning this test can be used) for the duration of the COVID-19 declaration under Section 564(b)(1) of the Act, 21 U.S.C.section 360bbb-3(b)(1), unless the authorization is terminated  or revoked sooner.       Influenza A by PCR NEGATIVE NEGATIVE Final   Influenza B by PCR NEGATIVE NEGATIVE Final    Comment: (NOTE) The Xpert Xpress SARS-CoV-2/FLU/RSV plus assay is intended as an aid in the diagnosis of influenza from Nasopharyngeal swab specimens and should not be used as a sole basis for treatment. Nasal washings and aspirates are  unacceptable for Xpert Xpress SARS-CoV-2/FLU/RSV testing.  Fact Sheet for Patients: BloggerCourse.com  Fact Sheet for Healthcare Providers: SeriousBroker.it  This test is not yet approved or cleared by the [place] FDA and has been authorized for detection and/or diagnosis of SARS-CoV-2 by FDA under an Emergency Use Authorization (EUA). This EUA will remain in effect (meaning this test can be used) for the duration of the COVID-19 declaration under Section 564(b)(1) of the Act, 21 U.S.C. section 360bbb-3(b)(1), unless the authorization is terminated or revoked.  Performed at [facility], [address], [place], [place] [address]   Blood Culture (routine x 2)     Status: None (Preliminary result)   Collection Time: 12/24/20  9:16 PM   Specimen: BLOOD  Result Value Ref Range Status   Specimen Description   Final    BLOOD BLOOD RIGHT HAND Performed at [facility], [address], [place], [place] [address]    Special Requests   Final    BOTTLES DRAWN AEROBIC AND ANAEROBIC Blood Culture results may not be optimal due to an excessive volume of blood received in culture bottles Performed at [facility], [address] [place], [place] [address]    Culture   Final    NO GROWTH 4 DAYS Performed at [facility], [address], [place], [place] [address]    Report Status PENDING  Incomplete  Blood Culture (routine x 2)     Status: None (Preliminary result)   Collection Time: 12/25/20  5:16 AM   Specimen: BLOOD  Result Value Ref Range Status   Specimen Description   Final    BLOOD LEFT ANTECUBITAL Performed at [facility], [address] [place], [place] [address]    Special Requests   Final    BOTTLES DRAWN AEROBIC ONLY Blood Culture adequate volume Performed at [facility], [address], [place], [place] [address]    Culture   Final    NO GROWTH 4 DAYS Performed at [facility], [address] [place], [place] [address]    Report Status PENDING  Incomplete  Aerobic/Anaerobic Culture w Gram Stain (surgical/deep wound)     Status: None (Preliminary result)   Collection Time: 12/27/20  5:36 PM   Specimen: Wound  Result Value Ref Range Status   Specimen Description   Final    WOUND FOOT LEFT Performed at [facility], [address] [place], [place] [address]    Special Requests   Final    NONE Performed at [facility], [address], [place], [place] [address]    Gram Stain NO WBC SEEN NO ORGANISMS SEEN   Final   Culture   Final     NO GROWTH 2 DAYS NO ANAEROBES ISOLATED; CULTURE IN PROGRESS FOR 5 DAYS Performed at [facility], [address] [place], [place] [address]    Report Status PENDING  Incomplete  Aerobic/Anaerobic Culture w Gram Stain (surgical/deep wound)     Status: None (Preliminary result)   Collection Time: 12/27/20  5:36 PM   Specimen: Soft Tissue, Other  Result Value Ref Range Status   Specimen Description   Final    TISSUE FOOT LEFT Performed at [facility], [address] [place], [place] [address]    Special Requests   Final    NONE Performed at [facility], [address], [place], [place] [address]  Gram Stain   Final    RARE WBC PRESENT, PREDOMINANTLY MONONUCLEAR NO ORGANISMS SEEN Performed at [facility], [address], [place], [place] [address]    Culture   Final    RARE YEAST IDENTIFICATION TO FOLLOW NO ANAEROBES ISOLATED; CULTURE IN PROGRESS FOR 5 DAYS    Report Status PENDING  Incomplete         Radiology Studies: DG Foot Complete Left  Result Date: 12/27/2020 CLINICAL DATA:  Follow-up amputation EXAM: LEFT FOOT - COMPLETE 3+ VIEW COMPARISON:  12/24/2020 FINDINGS: Three views show amputation of the forefoot at the proximal metatarsal level. Soft tissue drain in place. No complicating feature by radiography. IMPRESSION: Amputation of the forefoot at the proximal metatarsal level. Electronically Signed   By: Paulina Fusi M.D.   On: 12/27/2020 19:35        Scheduled Meds:  (feeding supplement) PROSource Plus  30 mL Oral BID BM   amLODipine  10 mg Oral Daily   aspirin EC  81 mg Oral Daily   atorvastatin  20 mg Oral Daily   feeding supplement (GLUCERNA SHAKE)  237 mL Oral BID BM   heparin  5,000 Units Subcutaneous Q8H   insulin aspart  0-20 Units Subcutaneous Q4H   losartan  50 mg Oral Daily   metroNIDAZOLE  500 mg Oral Q12H   multivitamin with minerals  1 tablet Oral Daily   nutrition supplement (JUVEN)   1 packet Oral BID BM   sodium chloride flush  3 mL Intravenous Q12H   Continuous Infusions:  sodium chloride 250 mL (12/28/20 2115)   cefTRIAXone (ROCEPHIN)  IV 2 g (12/28/20 2216)   vancomycin 1,250 mg (12/29/20 1058)     LOS: 5 days    Marcellus Scott, MD, [place], [facility]. [facility]  To contact the attending provider between 7A-7P or the covering provider during after hours 7P-7A, please log into the web site [email or web address] and access using universal [place] password for that web site. If you do not have the password, please call the hospital operator.

## 2020-12-29 NOTE — Progress Notes (Signed)
  Subjective:  Patient ID: Carl [name], male    DOB: 11-11-1965,  MRN: [id number]  Mr. Kue seen at bedside this morning. States he is doing well. Relates some pain that when up to a 4 denies any other complaints.  No nausea, vomiting, fever or chills. Minimal pain.   Past Medical History:  Diagnosis Date   Diabetes mellitus without complication (HCC)    Hyperlipidemia    Hypertension    Obesity      Past Surgical History:  Procedure Laterality Date   AMPUTATION Left 12/27/2020   Procedure: LEFT TRANSMETATARSAL AMPUTATION;  Surgeon: Louann Sjogren, MD;  Location: [facility];  Service: Podiatry;  Laterality: Left;   AMPUTATION TOE Left 09/22/2020   HAND SURGERY     INCISION AND DRAINAGE Left 09/22/2020   Procedure: INCISION AND DRAINAGE LEFT FOOT WITH REMOVAL OF ALL NON VIABLE SOFT TISSUE AND BONE INCLUDING THRID TOE AMPUTATION;  Surgeon: Park Liter, DPM;  Location: [facility] OR;  Service: Podiatry;  Laterality: Left;   VASECTOMY     WOUND DEBRIDEMENT Left 09/26/2020   Procedure: DEBRIDEMENT WOUND LEFT FOOT;  Surgeon: Park Liter, DPM;  Location: [facility] OR;  Service: Podiatry;  Laterality: Left;    CBC Latest Ref Rng & Units 12/25/2020 12/24/2020 09/28/2020  WBC 4.0 - 10.5 K/uL 7.1 7.4 8.8  Hemoglobin 13.0 - 17.0 g/dL 12.1(L) 12.3(L) 11.0(L)  Hematocrit 39.0 - 52.0 % 37.4(L) 37.7(L) 32.7(L)  Platelets 150 - 400 K/uL 190 232 283    BMP Latest Ref Rng & Units 12/29/2020 12/27/2020 12/25/2020  Glucose 70 - 99 mg/dL - - 003(B)  BUN 6 - 20 mg/dL - - 12  Creatinine 0.48 - 1.24 mg/dL 8.89(V) 6.94(H) 0.38(U)  BUN/Creat Ratio 9 - 20 - - -  Sodium 135 - 145 mmol/L - - 137  Potassium 3.5 - 5.1 mmol/L - - 3.8  Chloride 98 - 111 mmol/L - - 102  CO2 22 - 32 mmol/L - - 25  Calcium 8.9 - 10.3 mg/dL - - 8.6(L)     Objective:   Vitals:   12/28/20 2001 12/29/20 0336  BP: (!) 152/79 124/70  Pulse: 93 82  Resp: 20 20  Temp: 98.1 F (36.7 C) 97.6 F (36.4 C)  SpO2: 98% 99%   General  AA&O x3. Normal mood and affect.  Vascular Dorsalis pedis and posterior tibial pulses 2/4 bilat. Brisk capillary refill to all digits. Pedal hair present.  Neurologic Epicritic sensation grossly intact.  Dermatologic Dressing in place. No strikethrough noted. Drain with 10 CC present. Incisions are intact and well coapted No dehiscence noted. No purulence noted. No erythema or edema surrounding incision.   Orthopedic: MMT 5/5 in dorsiflexion, plantarflexion, inversion, and eversion. Normal joint ROM without pain or crepitus.    Assessment & Plan:  Patient was evaluated and treated and all questions answered.   -S/p Left Transmetatarsal amputation. POD#2 -Cultures pending. No growth x 1 day. -Dressing changed without incident. Drain with 30 cc output yesterday. Will plan to pull POD#3. -Continue with IV abx.  -Non-weightbearing to operative extremity. Will need a post-operative shoe upon discharge. May heel weight bear for transfers.  - Discussed perioperative course with patient and expressed understanding. All questions answered.  -Upon discharge will follow-up in one week to clinic.  -Will continue to follow.   Louann Sjogren, MD  Accessible via secure chat for questions or concerns.

## 2020-12-30 ENCOUNTER — Encounter (HOSPITAL_COMMUNITY): Payer: Self-pay | Admitting: Podiatry

## 2020-12-30 LAB — CBC
HCT: 36.2 % — ABNORMAL LOW (ref 39.0–52.0)
Hemoglobin: 11.9 g/dL — ABNORMAL LOW (ref 13.0–17.0)
MCH: 27.5 pg (ref 26.0–34.0)
MCHC: 32.9 g/dL (ref 30.0–36.0)
MCV: 83.8 fL (ref 80.0–100.0)
Platelets: 248 10*3/uL (ref 150–400)
RBC: 4.32 MIL/uL (ref 4.22–5.81)
RDW: 14.5 % (ref 11.5–15.5)
WBC: 7.2 10*3/uL (ref 4.0–10.5)
nRBC: 0 % (ref 0.0–0.2)

## 2020-12-30 LAB — GLUCOSE, CAPILLARY
Glucose-Capillary: 157 mg/dL — ABNORMAL HIGH (ref 70–99)
Glucose-Capillary: 161 mg/dL — ABNORMAL HIGH (ref 70–99)
Glucose-Capillary: 198 mg/dL — ABNORMAL HIGH (ref 70–99)
Glucose-Capillary: 142 mg/dL — ABNORMAL HIGH (ref 70–99)

## 2020-12-30 LAB — CULTURE, BLOOD (ROUTINE X 2)
Culture: NO GROWTH
Culture: NO GROWTH
Special Requests: ADEQUATE

## 2020-12-30 LAB — SURGICAL PATHOLOGY

## 2020-12-30 MED ORDER — PROSOURCE PLUS PO LIQD: 30.0000 mL | Freq: Two times a day (BID) | ORAL | 0 refills | Status: DC

## 2020-12-30 MED ORDER — ADULT MULTIVITAMIN W/MINERALS CH: 1.0000 | ORAL_TABLET | Freq: Every day | ORAL | Status: AC

## 2020-12-30 MED ORDER — FLUCONAZOLE 200 MG PO TABS: 200.0000 mg | ORAL_TABLET | Freq: Every day | ORAL | 0 refills | Status: AC

## 2020-12-30 MED ORDER — JUVEN PO PACK
1.0000 | PACK | Freq: Two times a day (BID) | ORAL | 0 refills | Status: DC
Start: 2020-12-30 — End: 2021-09-07

## 2020-12-30 MED ORDER — GLUCERNA SHAKE PO LIQD: 237.0000 mL | Freq: Two times a day (BID) | ORAL | 12 refills | Status: DC

## 2020-12-30 MED ORDER — AMLODIPINE BESYLATE 10 MG PO TABS
10.0000 mg | ORAL_TABLET | Freq: Every day | ORAL | 1 refills | Status: DC
Start: 2020-12-31 — End: 2021-03-11

## 2020-12-30 MED ORDER — FLUCONAZOLE 200 MG PO TABS
200.0000 mg | ORAL_TABLET | Freq: Every day | ORAL | Status: DC
  Filled 2020-12-30: qty 1

## 2020-12-30 NOTE — Discharge Summary (Addendum)
Physician Discharge Summary  Carl [name] PRF:[id number] DOB: 1965/11/14  PCP: Eppie Gibson, MD  Admitted from: Home Discharged to: Home  Admit date: 12/24/2020 Discharge date: 12/30/2020  Recommendations for Outpatient Follow-up:    Follow-up Information     Eppie Gibson, MD. Schedule an appointment as soon as possible for a visit in 1 week(s).   Specialty: Family Medicine Why: To be seen with repeat labs (CBC & BMP). Contact information: [place] [address] [phone]         Lorenda Peck, MD Follow up on 01/05/2021.   Specialty: Podiatry Why: Please call office to confirm timing of appointment. Contact information: [name] [place] [address] [phone]                  Home Health: Pending PT evaluation    Equipment/Devices:     Durable Medical Equipment  (From admission, onward)           Start     Ordered   12/30/20 1545  For home use only DME Walker rolling  Once       Question Answer Comment  Walker: With 5 Inch Wheels   Patient needs a walker to treat with the following condition S/P transmetatarsal amputation of foot ([place])      12/30/20 1546             Discharge Condition: Improved and stable.   Code Status: Full Code Diet recommendation:  Discharge Diet Orders (From admission, onward)     Start     Ordered   12/30/20 0000  Diet - low sodium heart healthy        12/30/20 1319   12/30/20 0000  Diet Carb Modified        12/30/20 1319             Discharge Diagnoses:  Active Problems:   BMI 40.0-44.9, adult ([place])   Hyperlipidemia   Hypertension   Type 2 diabetes mellitus with foot ulcer ([place])   Diabetic foot infection ([place])   Osteomyelitis of second toe of left foot ([facility])   Acute osteomyelitis of metatarsal bone of left foot ([place])   Brief Summary: Carl [name] is a 55 y.o. male with a history of diabetes mellitus type 2, hypertension, hyperlipidemia.  Patient presented secondary to a non-healing wound with evidence of an active foot infection and concern for possible osteomyelitis. Antibiotics initiated. Podiatry consulted     Assessment & Plan:   Diabetic foot infection Left foot osteomyelitis Foot x-ray without evidence of osteomyelitis. No fevers or leukocytosis. Podiatry consulted on admission. MRI confirms osteomyelitis of distal/middle phalanx of left second toe in addition to osteomyelitis affecting the left third metatarsal bone. Blood cultures with no growth to date. 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  Confirmed with Dr. Blenda Mounts, podiatry that no further antibiotics needed at discharge since source of infection has now been removed by amputation.  She also confirmed that the surgical margins were clean.  She did recommend 2 weeks total of oral antifungals due to tissue culture showing rare yeast.  Surgical drain was removed today.  Dr. Blenda Mounts recommends no change in dressing until follow-up with her in the office on 10/26.  Postop shoe has been provided to the patient and he knows how to use it.  Weightbearing as per podiatry has been discussed with patient.  Podiatry is also cleared him  for discharge.   Diabetes mellitus, type 2 Hemoglobin A1C of 7.2%. Patient has been managing with diet changes and metformin. States he ran out of glipizide and has not been using it. Metformin discontinued while inpatient and resumed at discharge. -Treated in the hospital with SSI.  Fluctuating/labile and mildly uncontrolled.  Outpatient follow-up with PCP regarding better management of his DM.   Primary hypertension Hypertensive urgency Patient is on losartan 50 mg daily as an outpatient -Continue losartan 50 mg and amlodipine 10 mg daily - Controlled.   Hyperlipidemia -Continue Lipitor   Obesity Body mass index is 35.87 kg/m.  Anemia, chronic May be anemia of chronic  disease.  Stable.      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    Discharge Instructions  Discharge Instructions     Call MD for:  difficulty breathing, headache or visual disturbances   Complete by: As directed    Call MD for:  extreme fatigue   Complete by: As directed    Call MD for:  persistant dizziness or light-headedness   Complete by: As directed    Call MD for:  persistant nausea and vomiting   Complete by: As directed    Call MD for:  redness, tenderness, or signs of infection (pain, swelling, redness, odor or green/yellow discharge around incision site)   Complete by: As directed    Call MD for:  severe uncontrolled pain   Complete by: As directed    Call MD for:  temperature >100.4   Complete by: As directed    Diet - low sodium heart healthy   Complete by: As directed    Diet Carb Modified   Complete by: As directed    Discharge instructions   Complete by: As directed    May bear weight on your heel but recommend to be off his feet as much as possible. Use the [place] shoe. Keep the dressing on until follow up with Dr. Blenda Mounts on Tuesday the 26th.   Increase activity slowly   Complete by: As directed    No wound care   Complete by: As directed         Medication List     STOP taking these medications    doxycycline 100 MG tablet Commonly known as: VIBRA-TABS   glipiZIDE 5 MG tablet Commonly known as: Glucotrol   levofloxacin 250 MG tablet Commonly known as: Levaquin       TAKE these medications    acetaminophen 325 MG tablet Commonly known as: TYLENOL Take 2 tablets (650 mg total) by mouth every 6 (six) hours as needed for mild pain or fever.   amLODipine 10 MG tablet Commonly known as: NORVASC Take 1 tablet (10 mg total) by mouth daily. Start taking on: December 31, 2020   Apple Cider Vinegar 500 MG Tabs Take 1,000 mg by mouth 2 (two) times daily.   aspirin EC 81 MG tablet Take 1  tablet (81 mg total) by mouth daily. Swallow whole.   atorvastatin 20 MG tablet Commonly known as: LIPITOR Take 1 tablet (20 mg total) by mouth daily.   blood glucose meter kit and supplies Kit Dispense based on patient and insurance preference. Use up to four times daily as directed. (FOR ICD-9 250.00, 250.01).   diphenhydrAMINE 25 MG tablet Commonly known as: BENADRYL Take 25-50 mg by mouth every 6 (six) hours as needed for sleep.   feeding supplement (GLUCERNA SHAKE) Liqd Take 237 mLs by  mouth 2 (two) times daily between meals.   (feeding supplement) PROSource Plus liquid Take 30 mLs by mouth 2 (two) times daily between meals.   nutrition supplement (JUVEN) Pack Take 1 packet by mouth 2 (two) times daily between meals.   Fish Oil 500 MG Caps Take 500 mg by mouth every evening.   fluconazole 200 MG tablet Commonly known as: DIFLUCAN Take 1 tablet (200 mg total) by mouth daily for 12 doses. Start taking on: December 31, 2020   guaiFENesin 600 MG 12 hr tablet Commonly known as: MUCINEX Take 600 mg by mouth 2 (two) times daily as needed for cough.   ibuprofen 200 MG tablet Commonly known as: ADVIL Take 800 mg by mouth every 6 (six) hours as needed for mild pain.   losartan 50 MG tablet Commonly known as: COZAAR Take 1 tablet (50 mg total) by mouth daily.   metFORMIN 500 MG 24 hr tablet Commonly known as: GLUCOPHAGE-XR Take 2 tablets (1,000 mg total) by mouth 2 (two) times daily with a meal.   multivitamin with minerals Tabs tablet Take 1 tablet by mouth daily. Start taking on: December 31, 2020   Turmeric 500 MG Tabs Take 500 mg by mouth 2 (two) times daily.       Allergies  Allergen Reactions   Morphine And Related Other (See Comments)    hallucinations      Procedures/Studies: MRI Left foot with and without contrast  Result Date: 12/25/2020 CLINICAL DATA:  Foot swelling, diabetic, osteomyelitis suspected, xray done EXAM: MRI OF THE LEFT FOREFOOT  WITHOUT AND WITH CONTRAST TECHNIQUE: Multiplanar, multisequence MR imaging of the left forefoot was performed both before and after administration of intravenous contrast. CONTRAST:  3m GADAVIST GADOBUTROL 1 MMOL/ML IV SOLN COMPARISON:  X-ray 12/24/2020 FINDINGS: Bones/Joint/Cartilage Patient is status post amputation of the third ray at the level of the proximal metatarsal diaphysis. There is marked bone marrow edema with confluent low T1 marrow signal and fragmentation of the residual third metatarsal extending nearly to the level of the articular surface (series 6, images 11-12). Findings are compatible with acute osteomyelitis. Flexion deformity of the second toe. There is destruction of the distal phalanx of the second toe. Prominent bone marrow edema within the middle phalanx of the second toe with intermediate signal intensity. Findings are compatible with acute osteomyelitis. Marked bone marrow edema throughout the fourth metatarsal with confluent low T1 signal changes extending from the fourth metatarsal base to the fourth metatarsal neck (series 6, image 15), compatible with acute osteomyelitis. Mild bone marrow edema within the second metatarsal and second toe proximal phalanx with preserved T1 marrow signal, likely reactive osteitis. Mild patchy bone marrow edema within the midfoot, also likely reactive. Ligaments Intact Lisfranc ligament.  No evidence of acute ligamentous injury. Muscles and Tendons Extensive changes of denervation and myositis. No focal tenosynovial fluid collection. Soft tissues Marked diffuse soft tissue swelling and enhancement of the forefoot, likely representing cellulitis. There is scattered foci of susceptibility artifact in the region of the resection bed of the third ray, which may reflect soft tissue gas or micrometallic debris. No deep soft tissue ulceration. No organized or rim enhancing fluid collections. IMPRESSION: 1. Acute osteomyelitis of the residual third  metatarsal bone. 2. Acute osteomyelitis of the distal phalanx and middle phalanx of the second toe. 3. Acute osteomyelitis throughout the fourth metatarsal. 4. Mild bone marrow edema within the second metatarsal and second toe proximal phalanx, likely reactive osteitis. 5. Marked diffuse soft tissue swelling  and enhancement of the forefoot, likely representing cellulitis. There is scattered foci of susceptibility artifact in the region of the resection bed of the third ray, which may reflect soft tissue gas or micrometallic debris. No organized or rim-enhancing fluid collections. Electronically Signed   By: Davina Poke D.O.   On: 12/25/2020 18:32   DG Foot Complete Left  Result Date: 12/27/2020 CLINICAL DATA:  Follow-up amputation EXAM: LEFT FOOT - COMPLETE 3+ VIEW COMPARISON:  12/24/2020 FINDINGS: Three views show amputation of the forefoot at the proximal metatarsal level. Soft tissue drain in place. No complicating feature by radiography. IMPRESSION: Amputation of the forefoot at the proximal metatarsal level. Electronically Signed   By: Nelson Chimes M.D.   On: 12/27/2020 19:35   DG Foot Complete Left  Result Date: 12/24/2020 CLINICAL DATA:  Diabetes.  Prior third toe amputation.  Fever. EXAM: LEFT FOOT - COMPLETE 3+ VIEW COMPARISON:  10/29/2020, without report. FINDINGS: Remote amputation of the third digit at the level of the proximal metatarsal. No acute fracture or dislocation. No osseous destruction. Moderate diffuse soft tissue swelling about the midfoot and forefoot. Small Achilles and calcaneal spurs. No soft tissue gas. IMPRESSION: No plain film evidence of osteomyelitis. Diffuse soft tissue swelling. Electronically Signed   By: Abigail Miyamoto M.D.   On: 12/24/2020 19:25      Subjective: Patient denies complaints.  Has the postop shoe at bedside and states that he was educated as to how to use it.  Indicates that he really has little to no pain in the postop foot.  He is also well aware  of how to use as needed Tylenol or Motrin for pain.  Discharge Exam:  Vitals:   12/29/20 1253 12/29/20 2056 12/30/20 0337 12/30/20 1300  BP: (!) 146/78 (!) 150/79 120/80 136/72  Pulse: 77 72 72 75  Resp: 20 20 20 19   Temp: 97.6 F (36.4 C) 97.8 F (36.6 C) 97.8 F (36.6 C) 98.1 F (36.7 C)  TempSrc: Oral Axillary Axillary Axillary  SpO2: 100% 100% 97% 98%  Weight:      Height:          General exam: Young male, moderately built and obese lying comfortably propped up in bed without distress. Respiratory system: Clear to auscultation.  No increased work of breathing. Cardiovascular system: S1 & S2 heard, RRR. No murmurs, rubs, gallops or clicks. Gastrointestinal system: Abdomen is nondistended, soft and nontender. No organomegaly or masses felt. Normal bowel sounds heard. Central nervous system: Alert and oriented. No focal neurological deficits. Musculoskeletal: No edema. No calf tenderness.  Left foot dressing changed by podiatry this morning, clean and dry. Skin: No cyanosis. No rashes Psychiatry: Judgement and insight appear normal. Mood & affect appropriate.     The results of significant diagnostics from this hospitalization (including imaging, microbiology, ancillary and laboratory) are listed below for reference.     Microbiology: Recent Results (from the past 240 hour(s))  Resp Panel by RT-PCR (Flu A&B, Covid) Nasopharyngeal Swab     Status: None   Collection Time: 12/24/20  9:02 PM   Specimen: Nasopharyngeal Swab; Nasopharyngeal(NP) swabs in vial transport medium  Result Value Ref Range Status   SARS Coronavirus 2 by RT PCR NEGATIVE NEGATIVE Final    Comment: (NOTE) SARS-CoV-2 target nucleic acids are NOT DETECTED.  The SARS-CoV-2 RNA is generally detectable in upper respiratory specimens during the acute phase of infection. The lowest concentration of SARS-CoV-2 viral copies this assay can detect is 138 copies/mL. A negative  result does not preclude  SARS-Cov-2 infection and should not be used as the sole basis for treatment or other patient management decisions. A negative result may occur with  improper specimen collection/handling, submission of specimen other than nasopharyngeal swab, presence of viral mutation(s) within the areas targeted by this assay, and inadequate number of viral copies(<138 copies/mL). A negative result must be combined with clinical observations, patient history, and epidemiological information. The expected result is Negative.  Fact Sheet for Patients:  EntrepreneurPulse.com.au  Fact Sheet for Healthcare Providers:  IncredibleEmployment.be  This test is no t yet approved or cleared by the [place] FDA and  has been authorized for detection and/or diagnosis of SARS-CoV-2 by FDA under an Emergency Use Authorization (EUA). This EUA will remain  in effect (meaning this test can be used) for the duration of the COVID-19 declaration under Section 564(b)(1) of the Act, 21 U.S.C.section 360bbb-3(b)(1), unless the authorization is terminated  or revoked sooner.       Influenza A by PCR NEGATIVE NEGATIVE Final   Influenza B by PCR NEGATIVE NEGATIVE Final    Comment: (NOTE) The Xpert Xpress SARS-CoV-2/FLU/RSV plus assay is intended as an aid in the diagnosis of influenza from Nasopharyngeal swab specimens and should not be used as a sole basis for treatment. Nasal washings and aspirates are unacceptable for Xpert Xpress SARS-CoV-2/FLU/RSV testing.  Fact Sheet for Patients: EntrepreneurPulse.com.au  Fact Sheet for Healthcare Providers: IncredibleEmployment.be  This test is not yet approved or cleared by the [place] FDA and has been authorized for detection and/or diagnosis of SARS-CoV-2 by FDA under an Emergency Use Authorization (EUA). This EUA will remain in effect (meaning this test can be used) for the duration of  the COVID-19 declaration under Section 564(b)(1) of the Act, 21 U.S.C. section 360bbb-3(b)(1), unless the authorization is terminated or revoked.  Performed at [facility], [place] [address] [place], [place] [address]   Blood Culture (routine x 2)     Status: None   Collection Time: 12/24/20  9:16 PM   Specimen: BLOOD  Result Value Ref Range Status   Specimen Description   Final    BLOOD BLOOD RIGHT HAND Performed at [facility] [address], [place], [place] [address]    Special Requests   Final    BOTTLES DRAWN AEROBIC AND ANAEROBIC Blood Culture results may not be optimal due to an excessive volume of blood received in culture bottles Performed at [facility] [address] [place], [place] [address]    Culture   Final    NO GROWTH 5 DAYS Performed at [facility], [place] [address], [place], [place] [address]    Report Status 12/30/2020 FINAL  Final  Blood Culture (routine x 2)     Status: None   Collection Time: 12/25/20  5:16 AM   Specimen: BLOOD  Result Value Ref Range Status   Specimen Description   Final    BLOOD LEFT ANTECUBITAL Performed at [facility] [address], [place], [place] [address]    Special Requests   Final    BOTTLES DRAWN AEROBIC ONLY Blood Culture adequate volume Performed at [facility] [address], [place], [place] [address]    Culture   Final    NO GROWTH 5 DAYS Performed at [facility], [place] [address] [place], [place] [address]    Report Status 12/30/2020 FINAL  Final  Aerobic/Anaerobic Culture w Gram Stain (  surgical/deep wound)     Status: None (Preliminary result)   Collection Time: 12/27/20  5:36 PM   Specimen: Wound  Result Value Ref Range Status   Specimen Description   Final    WOUND FOOT LEFT Performed at [facility] [address] [place], [place] [address]    Special Requests   Final     NONE Performed at [facility], [place] [address] [place], [place] [address]    Gram Stain NO WBC SEEN NO ORGANISMS SEEN   Final   Culture   Final    NO GROWTH 3 DAYS NO ANAEROBES ISOLATED; CULTURE IN PROGRESS FOR 5 DAYS Performed at [facility] [address] [place], [place] [address]    Report Status PENDING  Incomplete  Aerobic/Anaerobic Culture w Gram Stain (surgical/deep wound)     Status: None (Preliminary result)   Collection Time: 12/27/20  5:36 PM   Specimen: Soft Tissue, Other  Result Value Ref Range Status   Specimen Description   Final    TISSUE FOOT LEFT Performed at [facility] [address], [place], [place] [address]    Special Requests   Final    NONE Performed at [facility], [place] [address] [place], [place] [address]    Gram Stain   Final    RARE WBC PRESENT, PREDOMINANTLY MONONUCLEAR NO ORGANISMS SEEN Performed at [facility], [place] [address] [place], [place] [address]    Culture   Final    RARE YEAST IDENTIFICATION TO FOLLOW NO ANAEROBES ISOLATED; CULTURE IN PROGRESS FOR 5 DAYS    Report Status PENDING  Incomplete     Labs: CBC: Recent Labs  Lab 12/24/20 2118 12/25/20 0516 12/30/20 0518  WBC 7.4 7.1 7.2  NEUTROABS 4.6  --   --   HGB 12.3* 12.1* 11.9*  HCT 37.7* 37.4* 36.2*  MCV 84.0 85.4 83.8  PLT 232 190 962    Basic Metabolic Panel: Recent Labs  Lab 12/24/20 2118 12/25/20 0516 12/27/20 0517 12/29/20 0513 12/29/20 1327  NA 136 137  --   --  134*  K 3.8 3.8  --   --  4.3  CL 102 102  --   --  101  CO2 25 25  --   --  24  GLUCOSE 123* 156*  --   --  195*  BUN 15 12  --   --  22*  CREATININE 0.46* 0.48* 0.53* 0.53* 0.65  CALCIUM 8.9 8.6*  --   --  8.9    Liver Function Tests: Recent Labs  Lab 12/24/20 2118  AST 21  ALT 25  ALKPHOS 74  BILITOT <0.1*  PROT 7.7  ALBUMIN 4.1    CBG: Recent Labs  Lab 12/29/20 2058 12/29/20 2306 12/30/20 0342  12/30/20 0822 12/30/20 1235  GLUCAP 176* 211* 157* 161* 198*     Urinalysis    Component Value Date/Time   COLORURINE STRAW (A) 12/24/2020 2028   APPEARANCEUR CLEAR 12/24/2020 2028   LABSPEC 1.010 12/24/2020 2028   PHURINE 5.0 12/24/2020 2028   GLUCOSEU NEGATIVE 12/24/2020 2028   HGBUR NEGATIVE 12/24/2020 2028   BILIRUBINUR NEGATIVE 12/24/2020 2028   BILIRUBINUR neg 03/21/2013 1003   [place] 12/24/2020 2028   PROTEINUR NEGATIVE 12/24/2020 2028   UROBILINOGEN 0.2 08/17/2015 1548   NITRITE NEGATIVE 12/24/2020 2028   LEUKOCYTESUR NEGATIVE 12/24/2020 2028      Time coordinating discharge: 35 minutes  SIGNED:  Vernell Leep, MD, [place]. [facility]  To contact the attending provider between 7A-7P or the covering provider during after hours 7P-7A, please log into the web site [email or web address] and access using universal [place] password for that web site. If you do not have the password, please call the hospital operator.

## 2020-12-30 NOTE — Discharge Instructions (Signed)

## 2020-12-30 NOTE — Evaluation (Signed)
Physical Therapy Evaluation Patient Details Name: Carl [name] MRN: [id number] DOB: 1966/02/03 Today's Date: 12/30/2020  History of Present Illness  Patient is a 55 y.o. male presented secondary to a non-healing wound with evidence of an active foot infection and concern for possible osteomyelitis. Podiatry consulted and pt now S/p Left Transmetatarsal amputation 10/17. Per podiatry he is NWB to operative extremity but may heel weight bear for transfers with Darco off loading shoe. PMH significant for DMII,HTN, HLD.    Clinical Impression  FABRICIO ENDSLEY is 55 y.o. male admitted with above HPI and diagnosis. Patient is currently limited by functional impairments below (see PT problem list). Patient evaluated by Physical Therapy with no further acute PT needs identified. All education has been completed and the patient has no further questions. Pt mobilizing at Mod I to supervision level with RW and good use of UE's to maintain NWB on Lt foot. See below for any follow-up Physical Therapy or equipment needs. PT is signing off. Thank you for this referral.      Recommendations for follow up therapy are one component of a multi-disciplinary discharge planning process, led by the attending physician.  Recommendations may be updated based on patient status, additional functional criteria and insurance authorization.  Follow Up Recommendations No PT follow up    Equipment Recommendations  Rolling walker with 5" wheels    Recommendations for Other Services       Precautions / Restrictions Precautions Precautions: Fall Required Braces or Orthoses: Other Brace Restrictions Weight Bearing Restrictions: Yes LLE Weight Bearing: Non weight bearing      Mobility  Bed Mobility Overal bed mobility: Independent                  Transfers Overall transfer level: Modified independent Equipment used: Rolling walker (2 wheeled)             General transfer comment: no assist needed, pt  steady with rise from EOB and able to maintain NWB status or heel touch down as needed for transfers.  Ambulation/Gait Ambulation/Gait assistance: Supervision Gait Distance (Feet): 20 Feet Assistive device: Rolling walker (2 wheeled) Gait Pattern/deviations: Step-to pattern (hop to) Gait velocity: decr   General Gait Details: pt able to ambulate with NWB on Lt LE or perform heel TDWB with darco shoe in place.  Stairs Stairs: Yes Stairs assistance: Supervision Stair Management: Step to pattern;Backwards;With walker Number of Stairs: 1 General stair comments: pt able to perform reverse step up with good use of UE's on RW to maintain NWB on Lt LE  Wheelchair Mobility    Modified Rankin (Stroke Patients Only)       Balance Overall balance assessment: Mild deficits observed, not formally tested                                           Pertinent Vitals/Pain Pain Assessment: No/denies pain    Home Living Family/patient expects to be discharged to:: Private residence Living Arrangements: Spouse/significant other Available Help at Discharge: Family Type of Home: House Home Access: Stairs to enter Entrance Stairs-Rails: None Entrance Stairs-Number of Steps: 1 Home Layout: One level Home Equipment: Walker - 4 wheels;Crutches;Shower seat      Prior Function Level of Independence: Independent         Comments: work Copy with a laser, 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     Hand Dominance  Dominant Hand: Right    Extremity/Trunk Assessment   Upper Extremity Assessment Upper Extremity Assessment: Overall WFL for tasks assessed    Lower Extremity Assessment Lower Extremity Assessment: Overall WFL for tasks assessed    Cervical / Trunk Assessment Cervical / Trunk Assessment: Normal  Communication   Communication: No difficulties  Cognition Arousal/Alertness: Awake/alert Behavior During Therapy: WFL for tasks assessed/performed Overall Cognitive  Status: Within Functional Limits for tasks assessed                                        General Comments      Exercises     Assessment/Plan    PT Assessment Patent does not need any further PT services  PT Problem List         PT Treatment Interventions      PT Goals (Current goals can be found in the Care Plan section)  Acute Rehab PT Goals Patient Stated Goal: get home and recover PT Goal Formulation: All assessment and education complete, DC therapy Time For Goal Achievement: 12/30/20 Potential to Achieve Goals: Good    Frequency     Barriers to discharge        Co-evaluation               AM-PAC PT "6 Clicks" Mobility  Outcome Measure Help needed turning from your back to your side while in a flat bed without using bedrails?: None Help needed moving from lying on your back to sitting on the side of a flat bed without using bedrails?: None Help needed moving to and from a bed to a chair (including a wheelchair)?: None Help needed standing up from a chair using your arms (e.g., wheelchair or bedside chair)?: None Help needed to walk in hospital room?: A Little Help needed climbing 3-5 steps with a railing? : A Little 6 Click Score: 22    End of Session   Activity Tolerance: Patient tolerated treatment well Patient left: in bed;with call bell/phone within reach Nurse Communication: Mobility status PT Visit Diagnosis: Other abnormalities of gait and mobility (R26.89);Difficulty in walking, not elsewhere classified (R26.2)    Time: 1025-8527 PT Time Calculation (min) (ACUTE ONLY): 12 min   Charges:   PT Evaluation $PT Eval Low Complexity: 1 Low          Wynn Maudlin, DPT Acute Rehabilitation Services Office [phone] Pager [phone]   Anitra Lauth 12/30/2020, 6:57 PM

## 2020-12-30 NOTE — Progress Notes (Signed)
Orthopedic Tech Progress Note Patient Details:  Carl [name] February 25, 1966 [id number]  Patient ID: [name], male   DOB: Jun 13, 1965, 55 y.o.   MRN: [id number]  [name] 12/30/2020, 10:57 AM Darco shoe applied to left foot.

## 2020-12-30 NOTE — Anesthesia Postprocedure Evaluation (Signed)
Anesthesia Post Note  Patient: Carl [name]  Procedure(s) Performed: LEFT TRANSMETATARSAL AMPUTATION (Left: Foot)     Patient location during evaluation: PACU Anesthesia Type: MAC Level of consciousness: awake and alert Pain management: pain level controlled Vital Signs Assessment: post-procedure vital signs reviewed and stable Respiratory status: spontaneous breathing Cardiovascular status: stable Anesthetic complications: no   No notable events documented.  Last Vitals:  Vitals:   12/29/20 2056 12/30/20 0337  BP: (!) 150/79 120/80  Pulse: 72 72  Resp: 20 20  Temp: 36.6 C 36.6 C  SpO2: 100% 97%    Last Pain:  Vitals:   12/30/20 0337  TempSrc: Axillary  PainSc:                  Nolon Nations

## 2020-12-30 NOTE — TOC Transition Note (Addendum)
Transition of Care [name]) - CM/SW Discharge Note   Patient Details  Name: Carl [name] MRN: [id number] Date of Birth: 05-10-1965  Transition of [facility]) CM/SW Contact:  [name], LCSW Phone Number: 12/30/2020, 1:26 PM   Clinical Narrative:   Patient who is stable for d/c will go stay with girlfriend.  Called Orthopedic Tech and asked for delivery of Darco boot, per MD request. No further needs identified.  TOC sign off.  Addendum: Was reminded that patient needs DME rolling walker.  Called [employer] and asked for delivery of charity walker to patient's room.    Final next level of care: Home/Self Care Barriers to Discharge: No Barriers Identified   Patient Goals and CMS Choice        Discharge Placement                       Discharge Plan and Services   Discharge Planning Services: CM Consult                                 Social Determinants of Health (SDOH) Interventions     Readmission Risk Interventions No flowsheet data found.

## 2020-12-30 NOTE — Progress Notes (Signed)
  Subjective:  Patient ID: Carl [name], male    DOB: [birth year],  MRN: [id number]  Mr. Eroh seen at bedside this morning. States he is doing well. Denies pain.   No nausea, vomiting, fever or chills. Minimal pain.   Past Medical History:  Diagnosis Date   Diabetes mellitus without complication (HCC)    Hyperlipidemia    Hypertension    Obesity      Past Surgical History:  Procedure Laterality Date   AMPUTATION Left 12/27/2020   Procedure: LEFT TRANSMETATARSAL AMPUTATION;  Surgeon: Louann Sjogren, MD;  Location: [facility];  Service: Podiatry;  Laterality: Left;   AMPUTATION TOE Left 09/22/2020   HAND SURGERY     INCISION AND DRAINAGE Left 09/22/2020   Procedure: INCISION AND DRAINAGE LEFT FOOT WITH REMOVAL OF ALL NON VIABLE SOFT TISSUE AND BONE INCLUDING THRID TOE AMPUTATION;  Surgeon: Park Liter, DPM;  Location: [facility] OR;  Service: Podiatry;  Laterality: Left;   VASECTOMY     WOUND DEBRIDEMENT Left 09/26/2020   Procedure: DEBRIDEMENT WOUND LEFT FOOT;  Surgeon: Park Liter, DPM;  Location: [facility] OR;  Service: Podiatry;  Laterality: Left;    CBC Latest Ref Rng & Units 12/30/2020 12/25/2020 12/24/2020  WBC 4.0 - 10.5 K/uL 7.2 7.1 7.4  Hemoglobin 13.0 - 17.0 g/dL 11.9(L) 12.1(L) 12.3(L)  Hematocrit 39.0 - 52.0 % 36.2(L) 37.4(L) 37.7(L)  Platelets 150 - 400 K/uL 248 190 232    BMP Latest Ref Rng & Units 12/29/2020 12/29/2020 12/27/2020  Glucose 70 - 99 mg/dL 263(Z) - -  BUN 6 - 20 mg/dL 85(Y) - -  Creatinine 8.50 - 1.24 mg/dL 2.77 4.12(I) 7.86(V)  BUN/Creat Ratio 9 - 20 - - -  Sodium 135 - 145 mmol/L 134(L) - -  Potassium 3.5 - 5.1 mmol/L 4.3 - -  Chloride 98 - 111 mmol/L 101 - -  CO2 22 - 32 mmol/L 24 - -  Calcium 8.9 - 10.3 mg/dL 8.9 - -     Objective:   Vitals:   12/29/20 2056 12/30/20 0337  BP: (!) 150/79 120/80  Pulse: 72 72  Resp: 20 20  Temp: 97.8 F (36.6 C) 97.8 F (36.6 C)  SpO2: 100% 97%   General AA&O x3. Normal mood and affect.  Vascular Dorsalis  pedis and posterior tibial pulses 2/4 bilat. Brisk capillary refill to all digits. Pedal hair present.  Neurologic Epicritic sensation grossly intact.  Dermatologic Dressing in place. No strikethrough noted. Drain with 2 CC present. Incisions are intact and well coapted No dehiscence noted. No purulence noted. No erythema or edema surrounding incision.   Orthopedic: MMT 5/5 in dorsiflexion, plantarflexion, inversion, and eversion. Normal joint ROM without pain or crepitus.    Assessment & Plan:  Patient was evaluated and treated and all questions answered.   -S/p Left Transmetatarsal amputation. POD#3 -Cultures pending. Rare yeast growing. Recommend antifungal.  -Dressing changed without incident. Drain with 12 cc output yesterday. Pulled at bedside today.  -Antibiotics per primary.  -Non-weightbearing to operative extremity. Will need a post-operative shoe upon discharge. May heel weight bear for transfers.  - Discussed perioperative course with patient and expressed understanding. All questions answered.  -Ok for discharge from podiatry standpoint.  -Patient to follow-up in one week with me in clinic.    Louann Sjogren, MD  Accessible via secure chat for questions or concerns.

## 2021-01-02 LAB — AEROBIC/ANAEROBIC CULTURE W GRAM STAIN (SURGICAL/DEEP WOUND)
Culture: NO GROWTH
Gram Stain: NONE SEEN

## 2021-01-04 ENCOUNTER — Encounter: Payer: Self-pay | Admitting: Podiatry

## 2021-01-04 ENCOUNTER — Ambulatory Visit (INDEPENDENT_AMBULATORY_CARE_PROVIDER_SITE_OTHER): Payer: Self-pay | Admitting: Family Medicine

## 2021-01-04 ENCOUNTER — Ambulatory Visit (INDEPENDENT_AMBULATORY_CARE_PROVIDER_SITE_OTHER): Payer: Self-pay

## 2021-01-04 ENCOUNTER — Ambulatory Visit (INDEPENDENT_AMBULATORY_CARE_PROVIDER_SITE_OTHER): Payer: Self-pay | Admitting: Podiatry

## 2021-01-04 ENCOUNTER — Encounter: Payer: Self-pay | Admitting: Family Medicine

## 2021-01-04 ENCOUNTER — Other Ambulatory Visit: Payer: Self-pay

## 2021-01-04 DIAGNOSIS — M869 Osteomyelitis, unspecified: Secondary | ICD-10-CM

## 2021-01-04 DIAGNOSIS — Z9889 Other specified postprocedural states: Secondary | ICD-10-CM

## 2021-01-04 DIAGNOSIS — E11621 Type 2 diabetes mellitus with foot ulcer: Secondary | ICD-10-CM

## 2021-01-04 DIAGNOSIS — I10 Essential (primary) hypertension: Secondary | ICD-10-CM

## 2021-01-04 DIAGNOSIS — E785 Hyperlipidemia, unspecified: Secondary | ICD-10-CM

## 2021-01-04 DIAGNOSIS — L97509 Non-pressure chronic ulcer of other part of unspecified foot with unspecified severity: Secondary | ICD-10-CM

## 2021-01-04 DIAGNOSIS — Z89432 Acquired absence of left foot: Secondary | ICD-10-CM

## 2021-01-04 MED ORDER — ATORVASTATIN CALCIUM 20 MG PO TABS: 20.0000 mg | ORAL_TABLET | Freq: Every day | ORAL | 1 refills | Status: DC

## 2021-01-04 NOTE — Patient Instructions (Addendum)
Good to see you today - Thank you for coming in  Things we discussed today:  DIABETES If your fasting blood sugar is regularly < 140 then stop the glipizide completely  If regularly > 200 or < 100 then let us know  Nutrition  Add greek yogurt  Add 2-3 servings of veggies a day  Continue the multivitamin   You can discuss colon cancer screening in the future  Please always bring your medication bottles  Come back to see Dr Marisue Humble in 2-3 months

## 2021-01-04 NOTE — Assessment & Plan Note (Signed)
BP Readings from Last 3 Encounters:  01/04/21 (!) 143/84  12/30/20 136/72  10/27/20 130/82   A little high today but had recent surgery.  Will monitor

## 2021-01-04 NOTE — Progress Notes (Signed)
  Subjective:  Patient ID: Carl [name], male    DOB: [birth year],   MRN: [id number]  No chief complaint on file.   55 y.o. male presents for post-op visit s/p left transmetatarsal amputation 12/27/20. States he has been doing well. Has noticed some bleeding through the bandage. Has been taking the antifungal. Staying off of it as much as possible. Here today with his daughter  . Denies any other pedal complaints. Denies n/v/f/c.   Past Medical History:  Diagnosis Date   Diabetes mellitus without complication (HCC)    Hyperlipidemia    Hypertension    Obesity     Objective:  Physical Exam: Vascular: DP/PT pulses 2/4 bilateral. CFT <3 seconds. Normal hair growth on digits. No edema.  Skin. No lacerations or abrasions bilateral feet.  8 cm incision across distal left clean dry and intact healing well. Dorsal incision with dehiscence noted. No erythema or edema surrounding area. Granular base noted to dorsal wound, after debridement measuring about 4 cm x 1.5 cm x 1 cm.  Musculoskeletal: MMT 5/5 bilateral lower extremities in DF, PF, Inversion and Eversion. Deceased ROM in DF of ankle joint.  Neurological: Sensation intact to light touch.   Assessment:   1. Post-operative state   2. S/P transmetatarsal amputation of foot, left (HCC)      Plan:  Patient was evaluated and treated and all questions answered. Patient s/p left tarnsmetatarsal amputaiton.  Continue with course of antifungal.  Dorsal sutures removed as they were not providing any benefit. Area debrided without incident.  Dressing reapplied today without incident. Saline WTD dressing. Advised patient to change daily. Expressed understanding.  Continue heel weightbearing and resting as much as possible.  Discussed protein rich diet and control sugars.  Patient to return in 2 weeks for wound eval and possible suture removal.   Louann Sjogren, DPM

## 2021-01-04 NOTE — Assessment & Plan Note (Signed)
Well controlled by report and recent A1c.  See after visit summary for plans.  Hope to wean off glipizide.  Discussed nutrition and plans to improve his diet

## 2021-01-04 NOTE — Assessment & Plan Note (Signed)
Stable after resection.  Has follow up with podiatry

## 2021-01-04 NOTE — Progress Notes (Signed)
    SUBJECTIVE:   CHIEF COMPLAINT / HPI:   Foot Amputation Hospitalization follow up  Feels he is doing well.  Minimal pain, no fever or discharge.  Sees podiatry today.  Taking diflucan regularly.  Using rolling walker  Diabetes His fasting blood sugar are around 150s,  No low blood sugar.  Is taking metformin and glipizide 2.5 mg daily.  Brings in his medications  Hypertension Taking amlodipine and losartan.  No edema  PERTINENT  PMH / PSH: accompanied by daughter who is helping with his medication   OBJECTIVE:   BP (!) 143/84   Pulse 95   Wt 241 lb (109.3 kg)   SpO2 99%   BMI 34.58 kg/m   L Foot bandaged  No leg edema Heart - Regular rate and rhythm.  No murmurs, gallops or rubs.    Lungs:  Normal respiratory effort, chest expands symmetrically. Lungs are clear to auscultation, no crackles or wheezes. Able to stand and walk on L heel around the room    ASSESSMENT/PLAN:   Hypertension BP Readings from Last 3 Encounters:  01/04/21 (!) 143/84  12/30/20 136/72  10/27/20 130/82   A little high today but had recent surgery.  Will monitor   Type 2 diabetes mellitus with foot ulcer (HCC) Well controlled by report and recent A1c.  See after visit summary for plans.  Hope to wean off glipizide.  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  Has follow up with podiatry      Carney Living, MD [facility]

## 2021-01-18 ENCOUNTER — Encounter: Payer: Self-pay | Admitting: Podiatry

## 2021-01-18 ENCOUNTER — Other Ambulatory Visit: Payer: Self-pay

## 2021-01-18 ENCOUNTER — Ambulatory Visit (INDEPENDENT_AMBULATORY_CARE_PROVIDER_SITE_OTHER): Payer: Self-pay | Admitting: Podiatry

## 2021-01-18 DIAGNOSIS — Z89432 Acquired absence of left foot: Secondary | ICD-10-CM

## 2021-01-18 DIAGNOSIS — Z9889 Other specified postprocedural states: Secondary | ICD-10-CM

## 2021-01-18 MED ORDER — DOXYCYCLINE HYCLATE 100 MG PO TABS: 100.0000 mg | ORAL_TABLET | Freq: Two times a day (BID) | ORAL | 0 refills | Status: DC

## 2021-01-18 NOTE — Progress Notes (Signed)
  Subjective:  Patient ID: Carl [name], male    DOB: May 26, 1965,   MRN: [id number]  Chief Complaint  Patient presents with   Routine Post Op    Routine post op follow up      55 y.o. male presents for post-op visit #2 s/p left transmetatarsal amputation 12/27/20. States he has been doing well. Has noticed some continuous drainage. Has had his girlfriend's sister change dressing for him Finished antifungal.  Staying off of it as much as possible. Here today with his daughter  . Denies any other pedal complaints. Denies n/v/f/c.   Past Medical History:  Diagnosis Date   Diabetes mellitus without complication (HCC)    Hyperlipidemia    Hypertension    Obesity     Objective:  Physical Exam: Vascular: DP/PT pulses 2/4 bilateral. CFT <3 seconds. Normal hair growth on digits. No edema.  Skin. No lacerations or abrasions bilateral feet.  8 cm incision across distal left clean dry and intact healing well. Small area distally and one laterally each about 1 cm with granular base. Marland Kitchen No erythema or edema surrounding area. Granular base noted to dorsal wound, after debridement measuring about 4 cm x 1.5 cm x 0.9 cm. Mild erythema surrounding with fibrous tissue present.  Musculoskeletal: MMT 5/5 bilateral lower extremities in DF, PF, Inversion and Eversion. Deceased ROM in DF of ankle joint.  Neurological: Sensation intact to light touch.   Assessment:   1. S/P transmetatarsal amputation of foot, left (HCC)   2. Post-operative state       Plan:  Patient was evaluated and treated and all questions answered. Patient s/p left tarnsmetatarsal amputaiton.   Dressing reapplied today without incident. Saline WTD dressing. Advised patient to change daily. Expressed understanding.  Continue heel weightbearing and resting as much as possible.  Discussed protein rich diet and control sugars.  Majority sutures removed two remain for wound tension.  Prescription for doxycycline provided Patient to  return in 2 weeks for wound eval   Louann Sjogren, DPM

## 2021-01-19 ENCOUNTER — Telehealth: Payer: Self-pay

## 2021-01-19 ENCOUNTER — Other Ambulatory Visit: Payer: Self-pay | Admitting: Podiatry

## 2021-01-19 MED ORDER — DOXYCYCLINE HYCLATE 50 MG PO CAPS: 50.0000 mg | ORAL_CAPSULE | Freq: Two times a day (BID) | ORAL | 0 refills | Status: DC

## 2021-01-19 MED ORDER — DOXYCYCLINE HYCLATE 50 MG PO CAPS
100.0000 mg | ORAL_CAPSULE | Freq: Two times a day (BID) | ORAL | 0 refills | Status: DC
Start: 2021-01-19 — End: 2021-02-01

## 2021-01-19 NOTE — Telephone Encounter (Signed)
Please change prescription from tablets to capsules  - they are currently out of tablets  doxycycline (VIBRA-TABS) 100 MG tablet- patient is waiting at the pharmacy

## 2021-01-19 NOTE — Telephone Encounter (Signed)
[facility] pharmacy is calling because they wanted to confirm the 50 mg change for the doxycycline capsules resent. Please advise.

## 2021-02-01 ENCOUNTER — Encounter: Payer: Self-pay | Admitting: Podiatry

## 2021-02-01 ENCOUNTER — Other Ambulatory Visit: Payer: Self-pay

## 2021-02-01 ENCOUNTER — Ambulatory Visit (INDEPENDENT_AMBULATORY_CARE_PROVIDER_SITE_OTHER): Payer: Self-pay | Admitting: Podiatry

## 2021-02-01 DIAGNOSIS — Z9889 Other specified postprocedural states: Secondary | ICD-10-CM

## 2021-02-01 DIAGNOSIS — T8130XA Disruption of wound, unspecified, initial encounter: Secondary | ICD-10-CM

## 2021-02-01 DIAGNOSIS — Z89432 Acquired absence of left foot: Secondary | ICD-10-CM

## 2021-02-01 MED ORDER — DOXYCYCLINE HYCLATE 50 MG PO CAPS: 100.0000 mg | ORAL_CAPSULE | Freq: Two times a day (BID) | ORAL | 0 refills | Status: AC

## 2021-02-01 NOTE — Progress Notes (Signed)
  Subjective:  Patient ID: Carl [name], male    DOB: 1965-09-19,   MRN: [id number]  No chief complaint on file.    55 y.o. male presents for post-op visit #3 s/p left transmetatarsal amputation 12/27/20. States he has been doing well. Has been taking antibiotic. Has had his girlfriend's sister change dressing for him. Finished antifungal.  Staying off of it as much as possible. Here today with his daughter  . Denies any other pedal complaints. Denies n/v/f/c.   Past Medical History:  Diagnosis Date   Diabetes mellitus without complication (HCC)    Hyperlipidemia    Hypertension    Obesity     Objective:  Physical Exam: Vascular: DP/PT pulses 2/4 bilateral. CFT <3 seconds. Normal hair growth on digits. No edema.  Skin. No lacerations or abrasions bilateral feet.  8 cm incision across distal left clean dry and intact healing well. Small area distally 1 cm x 1 cm with granular base. Marland Kitchen No erythema or edema surrounding area. Granular base noted to dorsal wound, after debridement measuring about 4 cm x 1.5 cm x 0.9 cm. Mild erythema surrounding with fibrous tissue present.  Musculoskeletal: MMT 5/5 bilateral lower extremities in DF, PF, Inversion and Eversion. Deceased ROM in DF of ankle joint.  Neurological: Sensation intact to light touch.   Assessment:   1. S/P transmetatarsal amputation of foot, left (HCC)   2. Post-operative state   3. Wound dehiscence        Plan:  Patient was evaluated and treated and all questions answered. Patient s/p left tarnsmetatarsal amputaiton.   Dressing reapplied today without incident. Saline WTD dressing. Advised patient to change daily. Expressed understanding.  Continue heel weightbearing and resting as much as possible.  Discussed protein rich diet and control sugars.  Majority sutures removed two remain for wound tension.  Refill for doxycycline provided Patient to return in 2 weeks for wound eval   Louann Sjogren, DPM

## 2021-02-15 ENCOUNTER — Ambulatory Visit: Payer: MEDICAID | Admitting: Podiatry

## 2021-02-15 ENCOUNTER — Other Ambulatory Visit: Payer: Self-pay

## 2021-02-15 ENCOUNTER — Encounter: Payer: Self-pay | Admitting: Podiatry

## 2021-02-15 ENCOUNTER — Ambulatory Visit (INDEPENDENT_AMBULATORY_CARE_PROVIDER_SITE_OTHER): Payer: Self-pay | Admitting: Podiatry

## 2021-02-15 DIAGNOSIS — Z89432 Acquired absence of left foot: Secondary | ICD-10-CM

## 2021-02-15 DIAGNOSIS — T8130XA Disruption of wound, unspecified, initial encounter: Secondary | ICD-10-CM

## 2021-02-15 DIAGNOSIS — Z9889 Other specified postprocedural states: Secondary | ICD-10-CM

## 2021-02-15 NOTE — Progress Notes (Signed)
  Subjective:  Patient ID: Carl [name], male    DOB: [birth year],   MRN: [id number]  No chief complaint on file.    55 y.o. male presents for post-op visit #4 s/p left transmetatarsal amputation 12/27/20. States he has been doing well. Has been taking antibiotic. Has had his girlfriend's sister change dressing for him.   Staying off of it as much as possible. . Denies any other pedal complaints. Denies n/v/f/c.   Past Medical History:  Diagnosis Date   Diabetes mellitus without complication (HCC)    Hyperlipidemia    Hypertension    Obesity     Objective:  Physical Exam: Vascular: DP/PT pulses 2/4 bilateral. CFT <3 seconds. Normal hair growth on digits. No edema.  Skin. No lacerations or abrasions bilateral feet.  8 cm incision across distal left clean dry and intact healing well. Small area distally 1 cm x 1 cm with granular base. Marland Kitchen No erythema or edema surrounding area. Granular base noted to dorsal wound, after debridement measuring about 3.4 cm x 1.2 cm x 0.2 cm. Mild erythema surrounding with fibrous tissue present.  Musculoskeletal: MMT 5/5 bilateral lower extremities in DF, PF, Inversion and Eversion. Deceased ROM in DF of ankle joint.  Neurological: Sensation intact to light touch.   Assessment:   1. Post-operative state   2. S/P transmetatarsal amputation of foot, left (HCC)   3. Wound dehiscence         Plan:  Patient was evaluated and treated and all questions answered. Patient s/p left tarnsmetatarsal amputaiton.   Dressing reapplied today without incident. Saline WTD dressing. Advised patient to change daily. Expressed understanding.  Continue heel weightbearing and resting as much as possible.  Discussed protein rich diet and control sugars.  Continue doxycycline.  Patient to return in 2 weeks for wound eval and X-rays   Louann Sjogren, DPM

## 2021-03-01 ENCOUNTER — Ambulatory Visit (INDEPENDENT_AMBULATORY_CARE_PROVIDER_SITE_OTHER): Payer: Self-pay

## 2021-03-01 ENCOUNTER — Other Ambulatory Visit: Payer: Self-pay | Admitting: Podiatry

## 2021-03-01 ENCOUNTER — Other Ambulatory Visit: Payer: Self-pay

## 2021-03-01 ENCOUNTER — Ambulatory Visit (INDEPENDENT_AMBULATORY_CARE_PROVIDER_SITE_OTHER): Payer: MEDICAID | Admitting: Podiatry

## 2021-03-01 ENCOUNTER — Encounter: Payer: Self-pay | Admitting: Podiatry

## 2021-03-01 DIAGNOSIS — T8130XA Disruption of wound, unspecified, initial encounter: Secondary | ICD-10-CM

## 2021-03-01 DIAGNOSIS — T81509A Unspecified complication of foreign body accidentally left in body following unspecified procedure, initial encounter: Secondary | ICD-10-CM

## 2021-03-01 DIAGNOSIS — Z9889 Other specified postprocedural states: Secondary | ICD-10-CM

## 2021-03-01 NOTE — Progress Notes (Deleted)
G

## 2021-03-01 NOTE — Progress Notes (Signed)
°  Subjective:  Patient ID: Carl [name], male    DOB: 1965-06-13,   MRN: [id number]  No chief complaint on file.     55 y.o. male presents for post-op visit #5 s/p left transmetatarsal amputation 12/27/20. States he has been doing well. Has  finished his antibiotics.  Has had his girlfriend's sister change dressing for him.   Staying off of it as much as possible. . Denies any other pedal complaints. Denies n/v/f/c.   Past Medical History:  Diagnosis Date   Diabetes mellitus without complication (HCC)    Hyperlipidemia    Hypertension    Obesity     Objective:  Physical Exam: Vascular: DP/PT pulses 2/4 bilateral. CFT <3 seconds. Normal hair growth on digits. No edema.  Skin. No lacerations or abrasions bilateral feet.  8 cm incision across distal left clean dry and intact healing well. Granular base noted to dorsal wound, after debridement measuring about 2 cm x 1 cm x 0.1 cm. Mild erythema surrounding with fibrous tissue present.  Musculoskeletal: MMT 5/5 bilateral lower extremities in DF, PF, Inversion and Eversion. Deceased ROM in DF of ankle joint.  Neurological: Sensation intact to light touch.   Assessment:   1. Status post left foot surgery   2. Wound dehiscence         Plan:  Patient was evaluated and treated and all questions answered. Patient s/p left tarnsmetatarsal amputaiton.  X-rays reviewed. Heterotrophic ossification noted to distal 1-3 metatarsals. No osseous erosions noted.  Dressing reapplied today without incident.  Betadine and DSD dressing. Advised patient to change daily. Expressed understanding.  Continue heel weightbearing and resting as much as possible.  Discussed protein rich diet and sugar control.  No abx necessary today Patient to return in 2 weeks for wound eval and X-rays   Louann Sjogren, DPM

## 2021-03-11 ENCOUNTER — Ambulatory Visit (INDEPENDENT_AMBULATORY_CARE_PROVIDER_SITE_OTHER): Payer: Self-pay | Admitting: Student

## 2021-03-11 ENCOUNTER — Other Ambulatory Visit: Payer: Self-pay

## 2021-03-11 ENCOUNTER — Encounter: Payer: Self-pay | Admitting: Student

## 2021-03-11 DIAGNOSIS — L97509 Non-pressure chronic ulcer of other part of unspecified foot with unspecified severity: Secondary | ICD-10-CM

## 2021-03-11 DIAGNOSIS — E119 Type 2 diabetes mellitus without complications: Secondary | ICD-10-CM

## 2021-03-11 DIAGNOSIS — Z0000 Encounter for general adult medical examination without abnormal findings: Secondary | ICD-10-CM

## 2021-03-11 DIAGNOSIS — E785 Hyperlipidemia, unspecified: Secondary | ICD-10-CM

## 2021-03-11 DIAGNOSIS — I10 Essential (primary) hypertension: Secondary | ICD-10-CM

## 2021-03-11 DIAGNOSIS — E11621 Type 2 diabetes mellitus with foot ulcer: Secondary | ICD-10-CM

## 2021-03-11 MED ORDER — ATORVASTATIN CALCIUM 20 MG PO TABS: 20.0000 mg | ORAL_TABLET | Freq: Every day | ORAL | 1 refills | Status: DC

## 2021-03-11 MED ORDER — METFORMIN HCL ER 500 MG PO TB24: 1000.0000 mg | ORAL_TABLET | Freq: Two times a day (BID) | ORAL | 3 refills | Status: DC

## 2021-03-11 MED ORDER — GLIPIZIDE ER 2.5 MG PO TB24: 2.5000 mg | ORAL_TABLET | Freq: Every day | ORAL | 0 refills | Status: DC

## 2021-03-11 MED ORDER — LOSARTAN POTASSIUM 50 MG PO TABS: 50.0000 mg | ORAL_TABLET | Freq: Every day | ORAL | 3 refills | Status: DC

## 2021-03-11 MED ORDER — AMLODIPINE BESYLATE 10 MG PO TABS: 10.0000 mg | ORAL_TABLET | Freq: Every day | ORAL | 3 refills | Status: DC

## 2021-03-11 NOTE — Patient Instructions (Signed)
[name] It is such a joy to take care you! Thank you for coming in today.   As a reminder, here is a recap of what we talked about today:  -I want you to continue taking all of your home medications for the next 2 months.  At that time I want to see you back and we will recheck your A1c.  If it is improved, I am hopeful that we can take you off of the glipizide at that time. -I recommend that she get a flu shot this year, it will be cheaper for you to get this at your own pharmacy.  It looks like at your [facility] it would be $43.  I am okay with you waiting until you get back to work before doing this as I know the cost is an issue. -Hopefully will hear back soon from the financial assistance program so that we can work towards getting you a colonoscopy and other health maintenance items.  I recommend that you always bring your medications to each appointment as this makes it easy to ensure we are on the correct medications and helps [place] not miss when refills are needed.  Take care and seek immediate care sooner if you develop any concerns.   Eliezer Mccoy, MD [facility]

## 2021-03-11 NOTE — Progress Notes (Signed)
SUBJECTIVE:   CHIEF COMPLAINT / HPI:   Medication Refills HTN   DM2 Patient reports that he recently ran out of most of his controller medications including atorvastatin, losartan, amlodipine, glipizide.  His blood pressures have been running high but were well controlled prior to running out of medications.  He also reports that he had very good glycemic control (100-130 with no hypoglycemic episodes) going into the holidays but that he ate a lot of bread and sweets over the holidays and his sugars have been running closer to 200 in recent weeks.  He is motivated to return to his lower carb diet as he felt well on this and is also hopeful to transition off of his glipizide.  Uninsured Status Patient has remained out of work for several months as he has undergone progressive amputations of his left foot.  He is hopeful that he will be able to return to work in mid January if cleared by his podiatrist.  Due to his uninsured status and recent unemployment, he has struggled to afford medications and medical supplies.  At our last visit, I provided him with financial assistance applications which he submitted but has not yet heard back about.  He is interested in pursuing health maintenance measures such as colonoscopy, pneumonia vaccine, flu vaccine, but is unable to afford them at this time.  PERTINENT  PMH / PSH: DM2, HLD, HTN  OBJECTIVE:   BP (!) 166/81    Pulse 91    Wt 247 lb 12.8 oz (112.4 kg)    SpO2 100%    BMI 35.56 kg/m   Physical Exam Constitutional:      General: He is not in acute distress.    Appearance: Normal appearance.  Cardiovascular:     Rate and Rhythm: Normal rate and regular rhythm.     Heart sounds: No murmur heard. Pulmonary:     Effort: Pulmonary effort is normal.     Breath sounds: No wheezing, rhonchi or rales.  Musculoskeletal:     Comments: Left foot status post transmetatarsal amputation, Band-Aid in place over small area of wound healing.  Neurological:      Mental Status: He is alert.     Comments: Gait slightly asymmetric but stable     ASSESSMENT/PLAN:   Hypertension BP elevated to 166/81 today, however patient has been out of all of his blood pressure medicines for a few weeks. -Will refill amlodipine and losartan today  Type 2 diabetes mellitus with foot ulcer (HCC) Per patient report, glucose control has been much improved prior to the holidays, ranging from 100-130.  However has been closer to 200 recently since eating lots of carbs over the holidays and running out of his glipizide.  Patient has continued to take his metformin daily.  Has not had any hypoglycemic episodes even on glipizide.  Patient is on glipizide as opposed to other agents due to cost. -Will refill glipizide and metformin today -Recheck A1c at follow-up in 2 months, if not improved, hopeful to transition off of glipizide  Healthcare maintenance Patient aware of need for colonoscopy and vaccines (pneumonia, flu, shingles, COVID) but currently these are out of reach for him due to cost.  He has applied for financial assistance but has not yet heard back.  Encouraged him to reach out if he gets financial assistance so that we can move forward on these health maintenance items.  Otherwise we will follow-up with him at 54-month follow-up visit.     Fredric Mare  Marisue Humble, MD [facility]

## 2021-03-11 NOTE — Assessment & Plan Note (Signed)
BP elevated to 166/81 today, however patient has been out of all of his blood pressure medicines for a few weeks. -Will refill amlodipine and losartan today

## 2021-03-11 NOTE — Assessment & Plan Note (Signed)
Patient aware of need for colonoscopy and vaccines (pneumonia, flu, shingles, COVID) but currently these are out of reach for him due to cost.  He has applied for financial assistance but has not yet heard back.  Encouraged him to reach out if he gets financial assistance so that we can move forward on these health maintenance items.  Otherwise we will follow-up with him at 72-month follow-up visit.

## 2021-03-11 NOTE — Assessment & Plan Note (Signed)
Per patient report, glucose control has been much improved prior to the holidays, ranging from 100-130.  However has been closer to 200 recently since eating lots of carbs over the holidays and running out of his glipizide.  Patient has continued to take his metformin daily.  Has not had any hypoglycemic episodes even on glipizide.  Patient is on glipizide as opposed to other agents due to cost. -Will refill glipizide and metformin today -Recheck A1c at follow-up in 2 months, if not improved, hopeful to transition off of glipizide

## 2021-03-16 ENCOUNTER — Encounter: Payer: Self-pay | Admitting: Podiatry

## 2021-03-16 ENCOUNTER — Other Ambulatory Visit: Payer: Self-pay

## 2021-03-16 ENCOUNTER — Ambulatory Visit (INDEPENDENT_AMBULATORY_CARE_PROVIDER_SITE_OTHER): Payer: Self-pay | Admitting: Podiatry

## 2021-03-16 ENCOUNTER — Ambulatory Visit (INDEPENDENT_AMBULATORY_CARE_PROVIDER_SITE_OTHER): Payer: Self-pay

## 2021-03-16 DIAGNOSIS — T8130XA Disruption of wound, unspecified, initial encounter: Secondary | ICD-10-CM

## 2021-03-16 DIAGNOSIS — Z9889 Other specified postprocedural states: Secondary | ICD-10-CM

## 2021-03-16 NOTE — Progress Notes (Signed)
°  Subjective:  Patient ID: Carl [name], male    DOB: 1965-11-01,   MRN: [id number]  Chief Complaint  Patient presents with   Wound Check    2 week follow up left foot wound check.pt states he feels he is doing well.      56 y.o. male presents for post-op visit #6 s/p left transmetatarsal amputation 12/27/20. States he has been doing well. Has  finished his antibiotics. Relates he thinks it is healed and is hoping to go back to work. Relates he no longer has insurance because he wasn't working. Staying off of it as much as possible. . Denies any other pedal complaints. Denies n/v/f/c.   Past Medical History:  Diagnosis Date   Diabetes mellitus without complication (HCC)    Hyperlipidemia    Hypertension    Obesity     Objective:  Physical Exam: Vascular: DP/PT pulses 2/4 bilateral. CFT <3 seconds. Normal hair growth on digits. No edema.  Skin. No lacerations or abrasions bilateral feet.  8 cm incision across distal left clean dry and intact healing well. Wound healed. Mild erythema to distal tma stump.  Musculoskeletal: MMT 5/5 bilateral lower extremities in DF, PF, Inversion and Eversion. Deceased ROM in DF of ankle joint.  Neurological: Sensation intact to light touch.   Assessment:   1. Status post left foot surgery   2. Wound dehiscence         Plan:  Patient was evaluated and treated and all questions answered. Patient s/p left tarnsmetatarsal amputaiton.  X-rays reviewed. Heterotrophic ossification noted to distal 1-3 metatarsals. No osseous erosions noted.  Ulcer essentially healed today.  Dressing reapplied today without incident.  Betadine and DSD dressing. Advised patient to change daily. Expressed understanding.  May return to work and start wearing a regular shoe.  Did discuss custom inserts and shoes but patient does not have insurance at the moment so we will hold off until then to get shoes Discussed protein rich diet and sugar control.  No abx necessary  today Patient to return in 4 weeks.   Louann Sjogren, DPM

## 2021-04-18 ENCOUNTER — Encounter: Payer: Self-pay | Admitting: Podiatry

## 2021-04-18 ENCOUNTER — Other Ambulatory Visit: Payer: Self-pay

## 2021-04-18 ENCOUNTER — Ambulatory Visit (INDEPENDENT_AMBULATORY_CARE_PROVIDER_SITE_OTHER): Payer: BLUE CROSS/BLUE SHIELD | Admitting: Podiatry

## 2021-04-18 DIAGNOSIS — L97421 Non-pressure chronic ulcer of left heel and midfoot limited to breakdown of skin: Secondary | ICD-10-CM

## 2021-04-18 DIAGNOSIS — E08621 Diabetes mellitus due to underlying condition with foot ulcer: Secondary | ICD-10-CM

## 2021-04-18 DIAGNOSIS — Z89432 Acquired absence of left foot: Secondary | ICD-10-CM

## 2021-04-18 NOTE — Progress Notes (Signed)
°  Subjective:  Patient ID: Carl [name], male    DOB: 1966/03/02,   MRN: [id number]  Chief Complaint  Patient presents with   Foot Ulcer    4 week follow up left foot      56 y.o. male presents s/p left transmetatarsal amputation 12/27/20. States he has been doing well. Does relates that he did have an area open up about two weeks ago. Relates he started putting honey on the area. He is is regular shoes that he has rigged with bungie cords to fit around his foot.  He has returned to work and is back on insurance.  Denies any other pedal complaints. Denies n/v/f/c.   Past Medical History:  Diagnosis Date   Diabetes mellitus without complication (HCC)    Hyperlipidemia    Hypertension    Obesity     Objective:  Physical Exam: Vascular: DP/PT pulses 2/4 bilateral. CFT <3 seconds. Normal hair growth on digits. No edema.  Skin. No lacerations or abrasions bilateral feet.  8 cm incision across distal left clean dry and intact healing well. Lateral incison has opened up with wound measuring about 1.3 cm x 0.7 cm x 0.1 cm with granular base. No surrounding erythema edema or purulence. .  Musculoskeletal: MMT 5/5 bilateral lower extremities in DF, PF, Inversion and Eversion. Deceased ROM in DF of ankle joint.  Neurological: Sensation intact to light touch.   Assessment:   1. Diabetic ulcer of left midfoot associated with diabetes mellitus due to underlying condition, limited to breakdown of skin (HCC)   2. S/P transmetatarsal amputation of foot, left (HCC)          Plan:  Patient was evaluated and treated and all questions answered. Patient s/p left tarnsmetatarsal amputaiton.  Wound dehiscence of lateral tma incision on left limited to breakdown of skin.  X-rays reviewed. -Debridement as below. -Dressed with betadine, DSD. -Off-loading with surgical shoe. -No abx indicated.  -Discussed if we get improvement in the wound in the next two weeks can get him schedule for diabetic  shoes to offload these areas.  -Discussed glucose control and proper protein-rich diet.  -Discussed if any worsening redness, pain, fever or chills to call or may need to report to the emergency room. Patient expressed understanding.   Procedure: Excisional Debridement of Wound Rationale: Removal of non-viable soft tissue from the wound to promote healing.  Anesthesia: none Pre-Debridement Wound Measurements: 1.3 cm x 0.7 cm x 0.1 cm  Post-Debridement Wound Measurements: 1.3 cm x 0.7 cm x 0.2 cm  Type of Debridement: Sharp Excisional Tissue Removed: Non-viable soft tissue Depth of Debridement: subcutaneous tissue. Technique: Sharp excisional debridement to bleeding, viable wound base.  Dressing: Dry, sterile, compression dressing. Disposition: Patient tolerated procedure well. Patient to return in 2 week for follow-up.  Return in about 2 weeks (around 05/02/2021) for wound check.   Louann Sjogren, DPM

## 2021-05-02 ENCOUNTER — Ambulatory Visit: Payer: BLUE CROSS/BLUE SHIELD | Admitting: Podiatry

## 2021-05-03 ENCOUNTER — Ambulatory Visit (INDEPENDENT_AMBULATORY_CARE_PROVIDER_SITE_OTHER): Payer: BLUE CROSS/BLUE SHIELD | Admitting: Podiatry

## 2021-05-03 ENCOUNTER — Encounter: Payer: Self-pay | Admitting: Podiatry

## 2021-05-03 ENCOUNTER — Other Ambulatory Visit: Payer: Self-pay

## 2021-05-03 DIAGNOSIS — E08621 Diabetes mellitus due to underlying condition with foot ulcer: Secondary | ICD-10-CM

## 2021-05-03 DIAGNOSIS — Z89432 Acquired absence of left foot: Secondary | ICD-10-CM

## 2021-05-03 DIAGNOSIS — L97421 Non-pressure chronic ulcer of left heel and midfoot limited to breakdown of skin: Secondary | ICD-10-CM

## 2021-05-03 NOTE — Progress Notes (Signed)
°  Subjective:  Patient ID: Carl [name], male    DOB: 08/05/1965,   MRN: [id number]  No chief complaint on file.     56 y.o. male presents s/p left transmetatarsal amputation 12/27/20. States he has been doing well. He has been dressing open area as instructed.   He has returned to work and is back on insurance.  Denies any other pedal complaints. Denies n/v/f/c.   Past Medical History:  Diagnosis Date   Diabetes mellitus without complication (HCC)    Hyperlipidemia    Hypertension    Obesity     Objective:  Physical Exam: Vascular: DP/PT pulses 2/4 bilateral. CFT <3 seconds. Normal hair growth on digits. No edema.  Skin. No lacerations or abrasions bilateral feet.  8 cm incision across distal left clean dry and intact healing well. Lateral incison has opened up with wound measuring about 0.7 cm x 0.5 cm x 0.1 cm with granular base. No surrounding erythema edema or purulence. .  Musculoskeletal: MMT 5/5 bilateral lower extremities in DF, PF, Inversion and Eversion. Deceased ROM in DF of ankle joint.  Neurological: Sensation intact to light touch.   Assessment:   1. Diabetic ulcer of left midfoot associated with diabetes mellitus due to underlying condition, limited to breakdown of skin (HCC)   2. S/P transmetatarsal amputation of foot, left (HCC)           Plan:  Patient was evaluated and treated and all questions answered. Patient s/p left tarnsmetatarsal amputaiton.  Wound dehiscence of lateral tma incision on left limited to breakdown of skin.  X-rays reviewed. -Debridement as below. -Dressed with betadine, DSD. -Off-loading with surgical shoe. -No abx indicated.  -Discussed if we get improvement in the wound in the next two weeks can get him schedule for diabetic shoes to offload these areas.  -Discussed glucose control and proper protein-rich diet.  -Discussed if any worsening redness, pain, fever or chills to call or may need to report to the emergency room. Patient  expressed understanding.   Procedure: Excisional Debridement of Wound Rationale: Removal of non-viable soft tissue from the wound to promote healing.  Anesthesia: none Pre-Debridement Wound Measurements: 0.7 cm x 0.5 cm x 0.1 cm  Post-Debridement Wound Measurements: 0.7 cm x 0.5 cm x 0.2 cm  Type of Debridement: Sharp Excisional Tissue Removed: Non-viable soft tissue Depth of Debridement: subcutaneous tissue. Technique: Sharp excisional debridement to bleeding, viable wound base.  Dressing: Dry, sterile, compression dressing. Disposition: Patient tolerated procedure well. Patient to return in 2 week for follow-up.  No follow-ups on file.   Louann Sjogren, DPM

## 2021-05-17 ENCOUNTER — Other Ambulatory Visit: Payer: Self-pay

## 2021-05-17 ENCOUNTER — Encounter: Payer: Self-pay | Admitting: Podiatry

## 2021-05-17 ENCOUNTER — Ambulatory Visit (INDEPENDENT_AMBULATORY_CARE_PROVIDER_SITE_OTHER): Payer: BLUE CROSS/BLUE SHIELD | Admitting: Podiatry

## 2021-05-17 DIAGNOSIS — L97421 Non-pressure chronic ulcer of left heel and midfoot limited to breakdown of skin: Secondary | ICD-10-CM | POA: Diagnosis not present

## 2021-05-17 DIAGNOSIS — Z89432 Acquired absence of left foot: Secondary | ICD-10-CM

## 2021-05-17 DIAGNOSIS — E08621 Diabetes mellitus due to underlying condition with foot ulcer: Secondary | ICD-10-CM | POA: Diagnosis not present

## 2021-05-17 NOTE — Progress Notes (Signed)
?  Subjective:  ?Patient ID: Carl [name], male    DOB: [birth year],   MRN: [id number] ? ?Chief Complaint  ?Patient presents with  ? Diabetic Ulcer  ? ? ? ? ?56 y.o. male presents s/p left transmetatarsal amputation 12/27/20. States he has been doing well. He has been dressing open area as instructed.   Relates it is doing better. Denies any other pedal complaints. Denies n/v/f/c.  ? ?Past Medical History:  ?Diagnosis Date  ? Diabetes mellitus without complication (HCC)   ? Hyperlipidemia   ? Hypertension   ? Obesity   ? ? ?Objective:  ?Physical Exam: ?Vascular: DP/PT pulses 2/4 bilateral. CFT <3 seconds. Normal hair growth on digits. No edema.  ?Skin. No lacerations or abrasions bilateral feet.  8 cm incision across distal left clean dry and intact healing well. Lateral incison has opened up with wound measuring about 0.3 cm x 0.2 cm x 0.1 cm with granular base. No surrounding erythema edema or purulence. Marland Kitchen  ?Musculoskeletal: MMT 5/5 bilateral lower extremities in DF, PF, Inversion and Eversion. Deceased ROM in DF of ankle joint.  ?Neurological: Sensation intact to light touch.  ? ?Assessment:  ? ?1. Diabetic ulcer of left midfoot associated with diabetes mellitus due to underlying condition, limited to breakdown of skin (HCC)   ?2. S/P transmetatarsal amputation of foot, left (HCC)   ? ? ? ? ? ? ? ? ?Plan:  ?Patient was evaluated and treated and all questions answered. ?Patient s/p left tarnsmetatarsal amputaiton.  Wound dehiscence of lateral tma incision on left limited to breakdown of skin.  ?X-rays reviewed. ?-Debridement as below. ?-Dressed with betadine, DSD. ?-Off-loading with surgical shoe. ?-No abx indicated.  ?-will get scheduled to get fitted for DM custom shoes.   ?-Discussed glucose control and proper protein-rich diet.  ?-Discussed if any worsening redness, pain, fever or chills to call or may need to report to the emergency room. Patient expressed understanding.  ? ?Procedure: Excisional Debridement of  Wound ?Rationale: Removal of non-viable soft tissue from the wound to promote healing.  ?Anesthesia: none ?Pre-Debridement Wound Measurements: 0.3 cm x 0.2 cm x 0.1 cm  ?Post-Debridement Wound Measurements: 0.3 cm x 0.2 cm x 0.1 cm  ?Type of Debridement: Sharp Excisional ?Tissue Removed: Non-viable soft tissue ?Depth of Debridement: subcutaneous tissue. ?Technique: Sharp excisional debridement to bleeding, viable wound base.  ?Dressing: Dry, sterile, compression dressing. ?Disposition: Patient tolerated procedure well. Patient to return in 2 week for follow-up. ? ?Return in about 2 weeks (around 05/31/2021). ? ? ?Louann Sjogren, DPM  ? ? ?

## 2021-05-31 ENCOUNTER — Other Ambulatory Visit: Payer: Self-pay

## 2021-05-31 ENCOUNTER — Encounter: Payer: Self-pay | Admitting: Podiatry

## 2021-05-31 ENCOUNTER — Ambulatory Visit: Payer: BLUE CROSS/BLUE SHIELD | Admitting: Podiatry

## 2021-05-31 DIAGNOSIS — Z89432 Acquired absence of left foot: Secondary | ICD-10-CM | POA: Diagnosis not present

## 2021-05-31 DIAGNOSIS — E08621 Diabetes mellitus due to underlying condition with foot ulcer: Secondary | ICD-10-CM

## 2021-05-31 DIAGNOSIS — L97421 Non-pressure chronic ulcer of left heel and midfoot limited to breakdown of skin: Secondary | ICD-10-CM | POA: Diagnosis not present

## 2021-05-31 NOTE — Progress Notes (Signed)
?  Subjective:  ?Patient ID: Carl [name], male    DOB: 10/27/1965,   MRN: [id number] ? ?No chief complaint on file. ? ? ? ? ?56 y.o. male presents s/p left transmetatarsal amputation 12/27/20. States he has been doing well. He has been dressing open area as instructed.   Relates it is doing better. Denies any other pedal complaints. Denies n/v/f/c.  ? ?Past Medical History:  ?Diagnosis Date  ? Diabetes mellitus without complication (HCC)   ? Hyperlipidemia   ? Hypertension   ? Obesity   ? ? ?Objective:  ?Physical Exam: ?Vascular: DP/PT pulses 2/4 bilateral. CFT <3 seconds. Normal hair growth on digits. No edema.  ?Skin. No lacerations or abrasions bilateral feet.  8 cm incision across distal left clean dry and intact healing well. Dehiscence healed. No surrounding erythema edema or purulence. Marland Kitchen  ?Musculoskeletal: MMT 5/5 bilateral lower extremities in DF, PF, Inversion and Eversion. Deceased ROM in DF of ankle joint.  ?Neurological: Sensation intact to light touch.  ? ?Assessment:  ? ?1. S/P transmetatarsal amputation of foot, left (HCC)   ?2. Diabetic ulcer of left midfoot associated with diabetes mellitus due to underlying condition, limited to breakdown of skin (HCC)   ? ? ? ? ? ? ? ? ?Plan:  ?Patient was evaluated and treated and all questions answered. ?Patient s/p left tarnsmetatarsal amputaiton.  -healed.  ?X-rays reviewed. ?-No debridement necessary  ?-Advised to keep eye on areas.  ?-Off-loading with surgical shoe for two weeks then may transiiton to regular shoes.  ?-No abx indicated.  ?-will get scheduled to get fitted for DM custom shoes.  April 4th.  ?-Discussed glucose control and proper protein-rich diet.  ?-Discussed if any worsening redness, pain, fever or chills to call or may need to report to the emergency room. Patient expressed understanding.  ? ? ? ?Return in about 4 weeks (around 06/28/2021) for wound check. ? ? ?Louann Sjogren, DPM  ? ? ?

## 2021-06-09 ENCOUNTER — Other Ambulatory Visit: Payer: Self-pay | Admitting: *Deleted

## 2021-06-09 DIAGNOSIS — I10 Essential (primary) hypertension: Secondary | ICD-10-CM

## 2021-06-09 MED ORDER — AMLODIPINE BESYLATE 10 MG PO TABS: 10.0000 mg | ORAL_TABLET | Freq: Every day | ORAL | 3 refills | Status: DC

## 2021-06-14 ENCOUNTER — Ambulatory Visit (INDEPENDENT_AMBULATORY_CARE_PROVIDER_SITE_OTHER): Payer: BLUE CROSS/BLUE SHIELD

## 2021-06-14 DIAGNOSIS — E11621 Type 2 diabetes mellitus with foot ulcer: Secondary | ICD-10-CM | POA: Diagnosis not present

## 2021-06-14 DIAGNOSIS — L97509 Non-pressure chronic ulcer of other part of unspecified foot with unspecified severity: Secondary | ICD-10-CM | POA: Diagnosis not present

## 2021-06-14 DIAGNOSIS — Z9889 Other specified postprocedural states: Secondary | ICD-10-CM

## 2021-06-14 NOTE — Progress Notes (Addendum)
SITUATION ?Reason for Consult: Evaluation for Prefabricated Diabetic Shoes and Custom Diabetic Inserts. ?Patient / Caregiver Report: Patient would like well fitting shoes ? ?OBJECTIVE DATA: ?Patient History / Diagnosis:  ?  ICD-10-CM   ?1. Type 2 diabetes mellitus with foot ulcer, without long-term current use of insulin ([place])  E11.621   ? L97.509   ?  ?2. Status post left foot surgery  Z98.890   ?  ? ?Current or Previous Devices:   None and no history ? ?In-Person Foot Examination: ?Ulcers & Callousing:   Historical ?Deformities:    Left transmetatarsal amputation ?Sensation:    Compromised  ?Shoe Size:     11XW ? ?ORTHOTIC RECOMMENDATION ?Recommended Devices: ?- 1x pair prefabricated PDAC approved diabetic shoes; Patient Selected Apex X801M Size 11XW ?- 1x right custom-to-patient PDAC approved vacuum formed diabetic insoles. ?- 1x left toe filler ? ?GOALS OF SHOES AND INSOLES ?- Reduce shear and pressure ?- Reduce / Prevent callus formation ?- Reduce / Prevent ulceration ?- Protect the fragile healing compromised diabetic foot. ? ?Patient would benefit from diabetic shoes and inserts as patient has diabetes mellitus and the patient has one or more of the following conditions: ?- History of partial or complete amputation of the foot ?- History of previous foot ulceration. ?- History of pre-ulcerative callus ?- Peripheral neuropathy with evidence of callus formation ?- Foot deformity ?- Poor circulation ? ?ACTIONS PERFORMED ?Potential out of pocket cost was communicated to patient. Patient understood and consented to measurement and casting. Patient was casted for insoles via crush box and measured for shoes via brannock device. Procedure was explained and patient tolerated procedure well. All questions were answered and concerns addressed. Casts were shipped to central fabrication. Shoes were ordered. ? ?PLAN ?Patient is to be contacted and scheduled for fitting once shoes, insole, and toe filler have been  fabricated and received. ? ?

## 2021-06-28 ENCOUNTER — Ambulatory Visit: Payer: BLUE CROSS/BLUE SHIELD | Admitting: Podiatry

## 2021-06-28 ENCOUNTER — Encounter: Payer: Self-pay | Admitting: Podiatry

## 2021-06-28 DIAGNOSIS — E11621 Type 2 diabetes mellitus with foot ulcer: Secondary | ICD-10-CM

## 2021-06-28 DIAGNOSIS — Z9889 Other specified postprocedural states: Secondary | ICD-10-CM | POA: Diagnosis not present

## 2021-06-28 DIAGNOSIS — Z89432 Acquired absence of left foot: Secondary | ICD-10-CM | POA: Diagnosis not present

## 2021-06-28 DIAGNOSIS — L97509 Non-pressure chronic ulcer of other part of unspecified foot with unspecified severity: Secondary | ICD-10-CM | POA: Diagnosis not present

## 2021-06-28 NOTE — Progress Notes (Signed)
?  Subjective:  ?Patient ID: Carl [name], male    DOB: Jul 25, 1965,   MRN: [id number] ? ?Chief Complaint  ?Patient presents with  ? Wound Check  ?   4 week wound check  ? ? ? ? ?56 y.o. male presents s/p left transmetatarsal amputation 12/27/20. States he has been doing well. Has bee in regular shoes. .   Relates it is doing better. Denies any other pedal complaints. Denies n/v/f/c.  ? ?Past Medical History:  ?Diagnosis Date  ? Diabetes mellitus without complication (HCC)   ? Hyperlipidemia   ? Hypertension   ? Obesity   ? ? ?Objective:  ?Physical Exam: ?Vascular: DP/PT pulses 2/4 bilateral. CFT <3 seconds. Normal hair growth on digits. No edema.  ?Skin. Marland Kitchen  8 cm incision across distal left clean dry and intact healing well. Dehiscence healed. No surrounding erythema edema or purulence. Small abrasion to medial left heel and distal foot.  ?Musculoskeletal: MMT 5/5 bilateral lower extremities in DF, PF, Inversion and Eversion. Deceased ROM in DF of ankle joint.  ?Neurological: Sensation intact to light touch.  ? ?Assessment:  ? ?1. Type 2 diabetes mellitus with foot ulcer, without long-term current use of insulin (HCC)   ?2. Status post left foot surgery   ?3. S/P transmetatarsal amputation of foot, left (HCC)   ? ? ? ? ? ? ? ? ? ?Plan:  ?Patient was evaluated and treated and all questions answered. ?Patient s/p left tarnsmetatarsal amputaiton.  -healed.  ?X-rays reviewed. ?-No debridement necessary  ?-Advised to keep eye on areas.  ?-Continue in regular shoes.  ?-No abx indicated.  ?-Awaiting DM shoes  ?-Discussed glucose control and proper protein-rich diet.  ?-Discussed if any worsening redness, pain, fever or chills to call or may need to report to the emergency room. Patient expressed understanding.  ?Return in 3 months for foot check.  ? ? ? ?Return in about 3 months (around 09/27/2021) for diabetic foot check. ? ? ?Louann Sjogren, DPM  ? ? ?

## 2021-07-11 ENCOUNTER — Telehealth: Payer: Self-pay

## 2021-07-11 NOTE — Telephone Encounter (Signed)
Shoes and orthotics ready for pickup - called and left patient a message to schedule ?

## 2021-07-21 ENCOUNTER — Ambulatory Visit: Payer: MEDICAID

## 2021-07-21 DIAGNOSIS — E11621 Type 2 diabetes mellitus with foot ulcer: Secondary | ICD-10-CM

## 2021-07-21 DIAGNOSIS — Z89432 Acquired absence of left foot: Secondary | ICD-10-CM

## 2021-07-21 NOTE — Progress Notes (Signed)
SITUATION ?Reason for Visit: Fitting of Diabetic Shoes & Insoles ?Patient / Caregiver Report:  Patient is satisfied with fit and function of insoles, but shoes will not work for his transmet ? ?OBJECTIVE DATA: ?Patient History / Diagnosis:   ?  ICD-10-CM   ?1. Type 2 diabetes mellitus with foot ulcer, without long-term current use of insulin (HCC)  E11.621   ? L97.509   ?  ?2. S/P transmetatarsal amputation of foot, left (HCC)  O27.035   ?  ? ? ?Change in Status:   None ? ?ACTIONS PERFORMED: ?In-Person Delivery, patient was fit with: ?- 1x left L5000 toe filler [employer]: [id number] ?- 1x right A5513 diabetic accomodative insole, [employer]: [id number] ? ?Patient's existing Shoes and insoles were verified for structural integrity and safety. Patient wore shoes and insoles in office. Skin was inspected and free of areas of concern after wearing shoes and inserts. Shoes and inserts fit properly. Patient / Caregiver provided with ferbal instruction and demonstration regarding donning, doffing, wear, care, proper fit, function, purpose, cleaning, and use of shoes and insoles ' and in all related precautions and risks and benefits regarding shoes and insoles. Patient / Caregiver was instructed to wear properly fitting socks with shoes at all times. Patient was also provided with verbal instruction regarding how to report any failures or malfunctions of shoes or inserts, and necessary follow up care. Patient / Caregiver was also instructed to contact physician regarding change in status that may affect function of shoes and inserts.  ? ?Patient / Caregiver verbalized undersatnding of instruction provided. Patient / Caregiver demonstrated independence with proper donning and doffing of shoes and inserts. ? ?Apex [id number] were returned and [place] 728-E [id number] were ordered as replacements. ? ?PLAN ?Patient to follow with treating physician as recommended. Plan of care was discussed with and agreed upon by patient and/or caregiver.  All questions were answered and concerns addressed. ? ?

## 2021-08-04 ENCOUNTER — Telehealth: Payer: Self-pay

## 2021-08-04 NOTE — Telephone Encounter (Signed)
Replacement shoes are in, lvm for patient to schedule pickup

## 2021-08-16 ENCOUNTER — Encounter: Payer: Self-pay | Admitting: *Deleted

## 2021-08-18 ENCOUNTER — Ambulatory Visit: Payer: BLUE CROSS/BLUE SHIELD

## 2021-08-18 DIAGNOSIS — Z89432 Acquired absence of left foot: Secondary | ICD-10-CM

## 2021-08-18 NOTE — Progress Notes (Signed)
SITUATION Reason for Consult: Fitting of diabetic shoe Patient / Caregiver Report: Patient reports comfort and is satisfied with [employer] 628-E shoe  OBJECTIVE DATA History / Diagnosis:    ICD-10-CM   1. S/P transmetatarsal amputation of foot, left (HCC)  H54.562       Change in Pathology: None  ACTIONS PERFORMED Patient's equipment was checked for structural stability and fit. Device(s) intact and fit is excellent. All questions answered and concerns addressed.  PLAN Follow-up as needed (PRN). Plan of care discussed with and agreed upon by patient / caregiver.

## 2021-08-29 ENCOUNTER — Other Ambulatory Visit: Payer: Self-pay | Admitting: Student

## 2021-08-29 DIAGNOSIS — E785 Hyperlipidemia, unspecified: Secondary | ICD-10-CM

## 2021-09-07 ENCOUNTER — Ambulatory Visit (INDEPENDENT_AMBULATORY_CARE_PROVIDER_SITE_OTHER): Payer: BLUE CROSS/BLUE SHIELD | Admitting: Student

## 2021-09-07 ENCOUNTER — Encounter: Payer: Self-pay | Admitting: Student

## 2021-09-07 VITALS — BP 139/70 | HR 74 | Ht 70.0 in | Wt 261.6 lb

## 2021-09-07 DIAGNOSIS — Z1211 Encounter for screening for malignant neoplasm of colon: Secondary | ICD-10-CM | POA: Diagnosis not present

## 2021-09-07 DIAGNOSIS — I10 Essential (primary) hypertension: Secondary | ICD-10-CM

## 2021-09-07 DIAGNOSIS — Z0000 Encounter for general adult medical examination without abnormal findings: Secondary | ICD-10-CM | POA: Diagnosis not present

## 2021-09-07 DIAGNOSIS — E11621 Type 2 diabetes mellitus with foot ulcer: Secondary | ICD-10-CM | POA: Diagnosis not present

## 2021-09-07 DIAGNOSIS — L97509 Non-pressure chronic ulcer of other part of unspecified foot with unspecified severity: Secondary | ICD-10-CM

## 2021-09-07 LAB — POCT GLYCOSYLATED HEMOGLOBIN (HGB A1C): HbA1c, POC (controlled diabetic range): 9.8 % — AB (ref 0.0–7.0)

## 2021-09-07 MED ORDER — TRULICITY 0.75 MG/0.5ML ~~LOC~~ SOAJ: 0.7500 mg | SUBCUTANEOUS | 0 refills | Status: DC

## 2021-09-07 NOTE — Patient Instructions (Signed)
Carl [name],  It is wonderful to see you today. Congratulations on getting back to work, I know that feels good! Also, you have insurance, woohoo!! Your A1c is up a bit today, let's see if we cant optimize your diabetic meds. I am starting you on a med called Trulicity that is a once weekly injection. We will start at a low dose and go up from there when I see you back in four weeks. We will also check back in on your BP at that visit, you are at the high end of what we would like right now. I am checking some labs, I will call you if we need to make any changes.  I am ordering you a cologuard test, they will mail this to you.   See you in four weeks, Dorothyann Gibbs, MD

## 2021-09-07 NOTE — Progress Notes (Signed)
SUBJECTIVE:   CHIEF COMPLAINT / HPI:   DM2: Patient with a history of very poorly controlled diabetes, had been away from care for about 5 years prior to admission to our hospital service 11 months ago.  Patient has been on metformin and glipizide since that time.  Medication options are limited due to his uninsured status.  He has been out of his glipizide for the last 1-2 months and has noticed that his blood sugars have been much higher on metformin monotherapy.  He reports readings as high as 300 at home.  Has continued to try and make lots of dietary and lifestyle interventions.  He is pleased that he has been able to get back to work after a transmetatarsal amputation several months ago.  This has been a great sense of both joy and increased physical activity for him.  He has also made dietary changes including cutting out sugar sweetened beverages and added sugars. He is exceptionally pleased that he now has medical insurance for the first time in a long time.  This expands treatment options for him.  We discussed the benefits of injectable medications such as GLP-1 agonist, he has no issue with weekly injections as he has had experience with insulin injections while in the hospital.  HTN: Patient does not check his blood pressures at home due to not having a blood pressure cuff.  Has been using beet root and cayenne pepper solutions and says that he "feels like [his] blood pressure is better" because he is now having morning erections which he had not had for several years prior.  Health Maintenance: As patient now has health insurance, we can pursue some of the health maintenance gaps that have been lapsing.  We discussed risk and benefits of colonoscopies versus Cologuard versus FOBT.  He would be amenable to a Cologuard and would much prefer this to a colonoscopy.  OBJECTIVE:   BP 139/70   Pulse 74   Ht 5\' 10"  (1.778 m)   Wt 118.7 kg   SpO2 96%   BMI 37.54 kg/m   Physical  Exam Vitals reviewed.  Constitutional:      General: He is not in acute distress. Cardiovascular:     Rate and Rhythm: Normal rate and regular rhythm.     Heart sounds: No murmur heard.    No friction rub. No gallop.  Pulmonary:     Effort: Pulmonary effort is normal. No respiratory distress.     Breath sounds: Normal breath sounds. No wheezing.      ASSESSMENT/PLAN:   Type 2 diabetes mellitus with foot ulcer (HCC) Unfortunately A1c is up from 7.2 to 9.8% today.  However, given his recently insurance status, we can try transitioning him to a more effective medication regimen.  He would be an excellent candidate for a GLP-1 agonist.  Also note recent weight gain with weight 261 pounds today up from 247 last visit.  Expect that GLP-1 agonist will also help to address his obesity. -Continue excellent lifestyle interventions -Will initiate Trulicity 0.75 mg weekly -Follow-up in 4 weeks -Continue metformin, stop glipizide  Hypertension BP is at the high end of acceptable range right now.  Patient continues on losartan 50 mg and amlodipine 10 mg daily.  Anticipate that if we can get his weight down with a GLP-1 agonist, that we may improve his blood pressure. -Follow-up in 4 weeks, if BP is elevated, will increase losartan to 100 mg daily -BMP today  Healthcare maintenance -  Cologuard ordered     Dorothyann Gibbs, MD [facility]

## 2021-09-08 LAB — BASIC METABOLIC PANEL
BUN/Creatinine Ratio: 30 — ABNORMAL HIGH (ref 9–20)
BUN: 24 mg/dL (ref 6–24)
CO2: 19 mmol/L — ABNORMAL LOW (ref 20–29)
Calcium: 9.3 mg/dL (ref 8.7–10.2)
Chloride: 97 mmol/L (ref 96–106)
Creatinine, Ser: 0.81 mg/dL (ref 0.76–1.27)
Glucose: 298 mg/dL — ABNORMAL HIGH (ref 70–99)
Potassium: 4.7 mmol/L (ref 3.5–5.2)
Sodium: 134 mmol/L (ref 134–144)
eGFR: 103 mL/min/{1.73_m2} (ref >59)

## 2021-09-08 NOTE — Assessment & Plan Note (Signed)
Unfortunately A1c is up from 7.2 to 9.8% today.  However, given his recently insurance status, we can try transitioning him to a more effective medication regimen.  He would be an excellent candidate for a GLP-1 agonist.  Also note recent weight gain with weight 261 pounds today up from 247 last visit.  Expect that GLP-1 agonist will also help to address his obesity. -Continue excellent lifestyle interventions -Will initiate Trulicity 0.75 mg weekly -Follow-up in 4 weeks -Continue metformin, stop glipizide

## 2021-09-08 NOTE — Assessment & Plan Note (Signed)
BP is at the high end of acceptable range right now.  Patient continues on losartan 50 mg and amlodipine 10 mg daily.  Anticipate that if we can get his weight down with a GLP-1 agonist, that we may improve his blood pressure. -Follow-up in 4 weeks, if BP is elevated, will increase losartan to 100 mg daily -BMP today

## 2021-09-08 NOTE — Assessment & Plan Note (Signed)
Cologuard ordered

## 2021-09-09 ENCOUNTER — Other Ambulatory Visit (HOSPITAL_COMMUNITY): Payer: Self-pay

## 2021-09-09 ENCOUNTER — Telehealth: Payer: Self-pay

## 2021-09-09 NOTE — Telephone Encounter (Signed)
A Prior Authorization was initiated for this patients TRULICITY through CoverMyMeds.   Key: [id number]

## 2021-09-12 NOTE — Telephone Encounter (Signed)
Prior Auth for patients medication TRULICITY approved by [employer] from 09/09/21 to 09/08/22.  Key: [id number]

## 2021-09-17 ENCOUNTER — Other Ambulatory Visit: Payer: Self-pay | Admitting: Student

## 2021-09-17 DIAGNOSIS — E119 Type 2 diabetes mellitus without complications: Secondary | ICD-10-CM

## 2021-09-28 ENCOUNTER — Ambulatory Visit: Payer: BLUE CROSS/BLUE SHIELD | Admitting: Podiatry

## 2021-09-28 ENCOUNTER — Encounter: Payer: Self-pay | Admitting: Podiatry

## 2021-09-28 DIAGNOSIS — Z89432 Acquired absence of left foot: Secondary | ICD-10-CM | POA: Diagnosis not present

## 2021-09-28 DIAGNOSIS — E08621 Diabetes mellitus due to underlying condition with foot ulcer: Secondary | ICD-10-CM

## 2021-09-28 DIAGNOSIS — L97509 Non-pressure chronic ulcer of other part of unspecified foot with unspecified severity: Secondary | ICD-10-CM | POA: Diagnosis not present

## 2021-09-28 DIAGNOSIS — E11621 Type 2 diabetes mellitus with foot ulcer: Secondary | ICD-10-CM | POA: Diagnosis not present

## 2021-09-28 DIAGNOSIS — L97422 Non-pressure chronic ulcer of left heel and midfoot with fat layer exposed: Secondary | ICD-10-CM | POA: Diagnosis not present

## 2021-09-28 NOTE — Progress Notes (Signed)
  Subjective:  Patient ID: Carl [name], male    DOB: [birth year],   MRN: [id number]  No chief complaint on file.     56 y.o. male presents s/p left transmetatarsal amputation 12/27/20. States he has been doing well. Has been in regular shoes. Relates he did step outside about a week ago barefoot and the wound opened up but relates it has been draining but has not been looking bad. States he stepped on something. States he has been keeping neosporin and a bandaid on the area.  Denies any other pedal complaints. Denies n/v/f/c.   Past Medical History:  Diagnosis Date   Diabetes mellitus without complication (HCC)    Hyperlipidemia    Hypertension    Obesity     Objective:  Physical Exam: Vascular: DP/PT pulses 2/4 bilateral. CFT <3 seconds. Normal hair growth on digits. No edema.  Skin. Marland Kitchen  8 cm incision across distal left clean dry and intact healing well. Dehiscence healed. No surrounding erythema edema or purulence. New wound to plantar left foot about 1.5 cm x 0.5 cm x 0.2 cm with granular base. No erythema edema or purulence noted.  Musculoskeletal: MMT 5/5 bilateral lower extremities in DF, PF, Inversion and Eversion. Deceased ROM in DF of ankle joint.  Neurological: Sensation intact to light touch.   Assessment:   1. Type 2 diabetes mellitus with foot ulcer, without long-term current use of insulin (HCC)   2. S/P transmetatarsal amputation of foot, left (HCC)             Plan:  Patient was evaluated and treated and all questions answered. Patient s/p left tarnsmetatarsal amputaiton.  -healed.  Ulcer left plantar midfoot with fat layer exposed.  -Debridement as below. -Dressed with betadine, DSD. -Off-loading with DM shoes  -No abx indicated.  -Discussed glucose control and proper protein-rich diet.  -Discussed if any worsening redness, pain, fever or chills to call or may need to report to the emergency room. Patient expressed understanding.   Procedure: Excisional  Debridement of Wound Rationale: Removal of non-viable soft tissue from the wound to promote healing.  Anesthesia: none Pre-Debridement Wound Measurements: 1 cm x 0.3 cm x 0.1 cm  Post-Debridement Wound Measurements: 1.5 cm x 0.5 cm x 0.2 cm  Type of Debridement: Sharp Excisional Tissue Removed: Non-viable soft tissue Depth of Debridement: subcutaneous tissue. Technique: Sharp excisional debridement to bleeding, viable wound base.  Dressing: Dry, sterile, compression dressing. Disposition: Patient tolerated procedure well. Patient to return in 2 week for follow-up.  Return in about 2 weeks (around 10/12/2021) for wound check.     No follow-ups on file.   Louann Sjogren, DPM

## 2021-10-03 ENCOUNTER — Encounter: Payer: Self-pay | Admitting: Student

## 2021-10-03 ENCOUNTER — Ambulatory Visit (INDEPENDENT_AMBULATORY_CARE_PROVIDER_SITE_OTHER): Payer: BLUE CROSS/BLUE SHIELD | Admitting: Student

## 2021-10-03 VITALS — BP 130/68 | HR 81 | Wt 259.0 lb

## 2021-10-03 DIAGNOSIS — L97509 Non-pressure chronic ulcer of other part of unspecified foot with unspecified severity: Secondary | ICD-10-CM | POA: Diagnosis not present

## 2021-10-03 DIAGNOSIS — E11621 Type 2 diabetes mellitus with foot ulcer: Secondary | ICD-10-CM | POA: Diagnosis not present

## 2021-10-03 MED ORDER — GLIPIZIDE ER 5 MG PO TB24: 5.0000 mg | ORAL_TABLET | Freq: Every day | ORAL | 3 refills | Status: DC

## 2021-10-03 NOTE — Patient Instructions (Signed)
Mr. Orlick,  I'm so sorry to hear that your Trulicity didn't work out. Let's go back to your metformin/glipizide combination for now and then can discuss Jardiance at your next visit. Be sure to keep checking your sugars and give me a call if they start getting out of control. See you in September,  Dorothyann Gibbs, MD

## 2021-10-03 NOTE — Progress Notes (Signed)
    SUBJECTIVE:   CHIEF COMPLAINT / HPI:   Diabetes At last visit patient noted to have increase in A1c 7.2>9.8 after running out of his glipizide. Patient recently became insured so started trial of Trulicity. Unfortunately, he found GI side effects of Trulicity intolerable with nausea so severe he missed several days of work. He self-discontinued after two doses and would like to restart his glipizide. "My numbers were really good on that." He and his girlfriend continue to try and implement dietary/lifestyle interventions. Discussed possibility of SGLT2i, he may be amenable in the future but for now would like to go back to what he knows will work--ie glipizide.    OBJECTIVE:   BP 130/68   Pulse 81   Wt 259 lb (117.5 kg)   SpO2 98%   BMI 37.16 kg/m   Physical Exam Vitals reviewed.  Constitutional:      General: He is not in acute distress. Cardiovascular:     Rate and Rhythm: Normal rate and regular rhythm.     Heart sounds: No murmur heard.    No friction rub. No gallop.  Pulmonary:     Effort: Pulmonary effort is normal.     Breath sounds: Normal breath sounds.  Abdominal:     General: There is no distension.     Palpations: Abdomen is soft.  Neurological:     General: No focal deficit present.     Mental Status: He is alert.      ASSESSMENT/PLAN:   Type 2 diabetes mellitus with foot ulcer (HCC) Intolerant of trulicity. Prefers returning to glipizide over starting SGLT2i.  - Glipizide 5mg  every other day (as he was taking previously) - Will re-visit Jardiance discussion at follow-up - F/u in 2 months for A1c check     , MD [facility]

## 2021-10-03 NOTE — Assessment & Plan Note (Signed)
Intolerant of trulicity. Prefers returning to glipizide over starting SGLT2i.  - Glipizide 5mg  every other day (as he was taking previously) - Will re-visit Jardiance discussion at follow-up - F/u in 2 months for A1c check

## 2021-10-12 ENCOUNTER — Ambulatory Visit: Payer: BLUE CROSS/BLUE SHIELD | Admitting: Podiatry

## 2021-10-12 ENCOUNTER — Encounter: Payer: Self-pay | Admitting: Podiatry

## 2021-10-12 DIAGNOSIS — Z89432 Acquired absence of left foot: Secondary | ICD-10-CM

## 2021-10-12 DIAGNOSIS — E08621 Diabetes mellitus due to underlying condition with foot ulcer: Secondary | ICD-10-CM

## 2021-10-12 DIAGNOSIS — L97422 Non-pressure chronic ulcer of left heel and midfoot with fat layer exposed: Secondary | ICD-10-CM | POA: Diagnosis not present

## 2021-10-12 NOTE — Progress Notes (Signed)
  Subjective:  Patient ID: Carl [name], male    DOB: [birth year],   MRN: [id number]  Chief Complaint  Patient presents with   Foot Ulcer     Diabetic ulcer -left foot -no pain , no swelling       56 y.o. male presents s/p left transmetatarsal amputation 12/27/20. And follow-up of new left foot wound. Relates he has been dressing with neosporin and a bandaid and doing well. Denies any other pedal complaints. Denies n/v/f/c.   Past Medical History:  Diagnosis Date   Diabetes mellitus without complication (HCC)    Hyperlipidemia    Hypertension    Obesity     Objective:  Physical Exam: Vascular: DP/PT pulses 2/4 bilateral. CFT <3 seconds. Normal hair growth on digits. No edema.  Skin. Marland Kitchen  8 cm incision across distal left clean dry and intact healing well. Dehiscence healed. No surrounding erythema edema or purulence. New wound to plantar left foot about 1.5 cm x 0.3 cm x 0.2 cm with granular base. No erythema edema or purulence noted.  Musculoskeletal: MMT 5/5 bilateral lower extremities in DF, PF, Inversion and Eversion. Deceased ROM in DF of ankle joint.  Neurological: Sensation intact to light touch.   Assessment:   1. 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 S/P transmetatarsal amputation of foot, left (HCC)             Plan:  Patient was evaluated and treated and all questions answered. Patient s/p left tarnsmetatarsal amputaiton.  -healed.  Ulcer left plantar midfoot with fat layer exposed.  -Debridement as below. -Dressed with betadine, DSD. -Off-loading with DM shoes  -No abx indicated.  -Discussed glucose control and proper protein-rich diet.  -Discussed if any worsening redness, pain, fever or chills to call or may need to report to the emergency room. Patient expressed understanding.   Procedure: Excisional Debridement of Wound Rationale: Removal of non-viable soft tissue  from the wound to promote healing.  Anesthesia: none Pre-Debridement Wound Measurements: Overylying callus   Post-Debridement Wound Measurements: 1.5 cm x 0.3 cm x 0.2 cm  Type of Debridement: Sharp Excisional Tissue Removed: Non-viable soft tissue Depth of Debridement: subcutaneous tissue. Technique: Sharp excisional debridement to bleeding, viable wound base.  Dressing: Dry, sterile, compression dressing. Disposition: Patient tolerated procedure well. Patient to return in 2 week for follow-up.  Return in about 2 weeks (around 10/26/2021) for wound check.     Return in about 2 weeks (around 10/26/2021) for wound check.   Louann Sjogren, DPM

## 2021-10-26 ENCOUNTER — Ambulatory Visit (INDEPENDENT_AMBULATORY_CARE_PROVIDER_SITE_OTHER): Payer: Self-pay | Admitting: Podiatry

## 2021-10-26 ENCOUNTER — Encounter: Payer: Self-pay | Admitting: Podiatry

## 2021-10-26 DIAGNOSIS — L97509 Non-pressure chronic ulcer of other part of unspecified foot with unspecified severity: Secondary | ICD-10-CM

## 2021-10-26 DIAGNOSIS — Z89432 Acquired absence of left foot: Secondary | ICD-10-CM

## 2021-10-26 DIAGNOSIS — L84 Corns and callosities: Secondary | ICD-10-CM

## 2021-10-26 DIAGNOSIS — E11621 Type 2 diabetes mellitus with foot ulcer: Secondary | ICD-10-CM

## 2021-10-26 NOTE — Progress Notes (Signed)
  Subjective:  Patient ID: Carl [name], male    DOB: March 20, 1965,   MRN: [id number]  Chief Complaint  Patient presents with   Wound Check    2 week wound check , patient states foot is much better it is healing , some swelling no redness.      56 y.o. male presents s/p left transmetatarsal amputation 12/27/20. And follow-up of left foot wound. Relates he has been dressing with neosporin and a bandaid and doing well. Thinks it is healed. Denies any other pedal complaints. Denies n/v/f/c.   Past Medical History:  Diagnosis Date   Diabetes mellitus without complication (HCC)    Hyperlipidemia    Hypertension    Obesity     Objective:  Physical Exam: Vascular: DP/PT pulses 2/4 bilateral. CFT <3 seconds. Normal hair growth on digits. No edema.  Skin. Marland Kitchen  8 cm incision across distal left clean dry and intact healing well. Dehiscence healed. No surrounding erythema edema or purulence. New wound to plantar left foot healed. . No erythema edema or purulence noted.  Musculoskeletal: MMT 5/5 bilateral lower extremities in DF, PF, Inversion and Eversion. Deceased ROM in DF of ankle joint.  Neurological: Sensation intact to light touch.   Assessment:   1. 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 Patient s/p left tarnsmetatarsal amputaiton.  -healed.  Ulcer left plantar midfoot -healed -Debridement of hyperkeratotic tissue with underlying wound healed.  -Dressed with bandaid.  -Off-loading with DM shoes  -No abx indicated.  -Discussed glucose control and proper protein-rich diet.  -Discussed if any worsening redness, pain, fever or chills to call or may need to report to the emergency room. Patient expressed understanding.  Follow-up in 4 weeks for re-check.   No follow-ups on file.     No follow-ups on  file.   Louann Sjogren, DPM

## 2021-11-22 ENCOUNTER — Encounter: Payer: Self-pay | Admitting: Podiatry

## 2021-11-24 ENCOUNTER — Other Ambulatory Visit: Payer: Self-pay | Admitting: Student

## 2021-11-24 DIAGNOSIS — E785 Hyperlipidemia, unspecified: Secondary | ICD-10-CM

## 2021-11-29 ENCOUNTER — Ambulatory Visit: Payer: Self-pay | Admitting: Podiatry

## 2021-12-07 ENCOUNTER — Ambulatory Visit: Payer: BLUE CROSS/BLUE SHIELD | Admitting: Student

## 2021-12-09 ENCOUNTER — Other Ambulatory Visit: Payer: Self-pay | Admitting: Student

## 2021-12-09 DIAGNOSIS — E119 Type 2 diabetes mellitus without complications: Secondary | ICD-10-CM

## 2022-03-10 ENCOUNTER — Other Ambulatory Visit: Payer: Self-pay | Admitting: Student

## 2022-03-10 DIAGNOSIS — E119 Type 2 diabetes mellitus without complications: Secondary | ICD-10-CM

## 2022-04-13 ENCOUNTER — Inpatient Hospital Stay (HOSPITAL_COMMUNITY)
Admission: EM | Admit: 2022-04-13 | Discharge: 2022-04-22 | DRG: 872 | Disposition: A | Discharge status: Home / Self Care | Payer: Self-pay | Attending: Internal Medicine | Admitting: Internal Medicine

## 2022-04-13 ENCOUNTER — Encounter (HOSPITAL_COMMUNITY): Payer: Self-pay

## 2022-04-13 DIAGNOSIS — R7881 Bacteremia: Secondary | ICD-10-CM | POA: Insufficient documentation

## 2022-04-13 DIAGNOSIS — E8770 Fluid overload, unspecified: Secondary | ICD-10-CM | POA: Diagnosis not present

## 2022-04-13 DIAGNOSIS — Z8249 Family history of ischemic heart disease and other diseases of the circulatory system: Secondary | ICD-10-CM

## 2022-04-13 DIAGNOSIS — W1830XA Fall on same level, unspecified, initial encounter: Secondary | ICD-10-CM | POA: Diagnosis not present

## 2022-04-13 DIAGNOSIS — R0902 Hypoxemia: Secondary | ICD-10-CM | POA: Insufficient documentation

## 2022-04-13 DIAGNOSIS — R112 Nausea with vomiting, unspecified: Principal | ICD-10-CM | POA: Insufficient documentation

## 2022-04-13 DIAGNOSIS — E860 Dehydration: Secondary | ICD-10-CM | POA: Diagnosis present

## 2022-04-13 DIAGNOSIS — E119 Type 2 diabetes mellitus without complications: Secondary | ICD-10-CM

## 2022-04-13 DIAGNOSIS — Z79899 Other long term (current) drug therapy: Secondary | ICD-10-CM

## 2022-04-13 DIAGNOSIS — R748 Abnormal levels of other serum enzymes: Secondary | ICD-10-CM | POA: Insufficient documentation

## 2022-04-13 DIAGNOSIS — R197 Diarrhea, unspecified: Secondary | ICD-10-CM | POA: Insufficient documentation

## 2022-04-13 DIAGNOSIS — I10 Essential (primary) hypertension: Secondary | ICD-10-CM | POA: Diagnosis present

## 2022-04-13 DIAGNOSIS — Z7982 Long term (current) use of aspirin: Secondary | ICD-10-CM

## 2022-04-13 DIAGNOSIS — R6520 Severe sepsis without septic shock: Secondary | ICD-10-CM | POA: Diagnosis present

## 2022-04-13 DIAGNOSIS — E11649 Type 2 diabetes mellitus with hypoglycemia without coma: Secondary | ICD-10-CM | POA: Diagnosis not present

## 2022-04-13 DIAGNOSIS — D6959 Other secondary thrombocytopenia: Secondary | ICD-10-CM | POA: Diagnosis present

## 2022-04-13 DIAGNOSIS — A4101 Sepsis due to Methicillin susceptible Staphylococcus aureus: Principal | ICD-10-CM | POA: Diagnosis present

## 2022-04-13 DIAGNOSIS — Z89422 Acquired absence of other left toe(s): Secondary | ICD-10-CM

## 2022-04-13 DIAGNOSIS — N179 Acute kidney failure, unspecified: Secondary | ICD-10-CM | POA: Diagnosis present

## 2022-04-13 DIAGNOSIS — D696 Thrombocytopenia, unspecified: Secondary | ICD-10-CM | POA: Insufficient documentation

## 2022-04-13 DIAGNOSIS — M47816 Spondylosis without myelopathy or radiculopathy, lumbar region: Secondary | ICD-10-CM | POA: Diagnosis present

## 2022-04-13 DIAGNOSIS — Y92230 Patient room in hospital as the place of occurrence of the external cause: Secondary | ICD-10-CM | POA: Diagnosis not present

## 2022-04-13 DIAGNOSIS — M009 Pyogenic arthritis, unspecified: Secondary | ICD-10-CM | POA: Diagnosis present

## 2022-04-13 DIAGNOSIS — K760 Fatty (change of) liver, not elsewhere classified: Secondary | ICD-10-CM | POA: Diagnosis present

## 2022-04-13 DIAGNOSIS — B9561 Methicillin susceptible Staphylococcus aureus infection as the cause of diseases classified elsewhere: Secondary | ICD-10-CM | POA: Insufficient documentation

## 2022-04-13 DIAGNOSIS — Z885 Allergy status to narcotic agent status: Secondary | ICD-10-CM

## 2022-04-13 DIAGNOSIS — Z87891 Personal history of nicotine dependence: Secondary | ICD-10-CM

## 2022-04-13 DIAGNOSIS — E876 Hypokalemia: Secondary | ICD-10-CM | POA: Diagnosis not present

## 2022-04-13 DIAGNOSIS — S80211A Abrasion, right knee, initial encounter: Secondary | ICD-10-CM | POA: Diagnosis not present

## 2022-04-13 DIAGNOSIS — E785 Hyperlipidemia, unspecified: Secondary | ICD-10-CM | POA: Diagnosis present

## 2022-04-13 DIAGNOSIS — Z833 Family history of diabetes mellitus: Secondary | ICD-10-CM

## 2022-04-13 DIAGNOSIS — Z1152 Encounter for screening for COVID-19: Secondary | ICD-10-CM

## 2022-04-13 DIAGNOSIS — E871 Hypo-osmolality and hyponatremia: Secondary | ICD-10-CM | POA: Insufficient documentation

## 2022-04-13 DIAGNOSIS — Z7984 Long term (current) use of oral hypoglycemic drugs: Secondary | ICD-10-CM

## 2022-04-13 DIAGNOSIS — Z6841 Body Mass Index (BMI) 40.0 and over, adult: Secondary | ICD-10-CM

## 2022-04-13 DIAGNOSIS — N390 Urinary tract infection, site not specified: Secondary | ICD-10-CM

## 2022-04-13 DIAGNOSIS — D649 Anemia, unspecified: Secondary | ICD-10-CM | POA: Diagnosis present

## 2022-04-13 LAB — RESP PANEL BY RT-PCR (RSV, FLU A&B, COVID)  RVPGX2
Influenza A by PCR: NEGATIVE
Influenza B by PCR: NEGATIVE
Resp Syncytial Virus by PCR: NEGATIVE
SARS Coronavirus 2 by RT PCR: NEGATIVE

## 2022-04-13 LAB — COMPREHENSIVE METABOLIC PANEL
ALT: 46 U/L — ABNORMAL HIGH (ref 0–44)
AST: 72 U/L — ABNORMAL HIGH (ref 15–41)
Albumin: 3.1 g/dL — ABNORMAL LOW (ref 3.5–5.0)
Alkaline Phosphatase: 83 U/L (ref 38–126)
Anion gap: 14 (ref 5–15)
BUN: 33 mg/dL — ABNORMAL HIGH (ref 6–20)
CO2: 23 mmol/L (ref 22–32)
Calcium: 8 mg/dL — ABNORMAL LOW (ref 8.9–10.3)
Chloride: 86 mmol/L — ABNORMAL LOW (ref 98–111)
Creatinine, Ser: 2.09 mg/dL — ABNORMAL HIGH (ref 0.61–1.24)
GFR, Estimated: 36 mL/min — ABNORMAL LOW (ref >60)
Glucose, Bld: 269 mg/dL — ABNORMAL HIGH (ref 70–99)
Potassium: 3.6 mmol/L (ref 3.5–5.1)
Sodium: 123 mmol/L — ABNORMAL LOW (ref 135–145)
Total Bilirubin: 3.4 mg/dL — ABNORMAL HIGH (ref 0.3–1.2)
Total Protein: 6.8 g/dL (ref 6.5–8.1)

## 2022-04-13 LAB — CBC
HCT: 32.2 % — ABNORMAL LOW (ref 39.0–52.0)
Hemoglobin: 11.2 g/dL — ABNORMAL LOW (ref 13.0–17.0)
MCH: 29 pg (ref 26.0–34.0)
MCHC: 34.8 g/dL (ref 30.0–36.0)
MCV: 83.4 fL (ref 80.0–100.0)
Platelets: 148 10*3/uL — ABNORMAL LOW (ref 150–400)
RBC: 3.86 MIL/uL — ABNORMAL LOW (ref 4.22–5.81)
RDW: 14.7 % (ref 11.5–15.5)
WBC: 11.5 10*3/uL — ABNORMAL HIGH (ref 4.0–10.5)
nRBC: 0 % (ref 0.0–0.2)

## 2022-04-13 LAB — LIPASE, BLOOD: Lipase: 43 U/L (ref 11–51)

## 2022-04-13 MED ORDER — SODIUM CHLORIDE 0.9 % IV SOLN
1000.0000 mL | INTRAVENOUS | Status: DC
  Administered 2022-04-14 (×2): 1000 mL via INTRAVENOUS

## 2022-04-13 MED ORDER — SODIUM CHLORIDE 0.9 % IV BOLUS (SEPSIS)
1000.0000 mL | Freq: Once | INTRAVENOUS | Status: AC
  Administered 2022-04-14: 1000 mL via INTRAVENOUS

## 2022-04-13 NOTE — ED Notes (Signed)
Pt unable to provide UA sample in triage. Urinal at bedside. Pt aware of UA sample

## 2022-04-13 NOTE — ED Triage Notes (Signed)
Patient arrived with complaints of NV since Saturday, states now feeling dehydrated.

## 2022-04-14 ENCOUNTER — Observation Stay (HOSPITAL_COMMUNITY): Payer: Self-pay

## 2022-04-14 ENCOUNTER — Inpatient Hospital Stay (HOSPITAL_COMMUNITY): Payer: Self-pay

## 2022-04-14 ENCOUNTER — Emergency Department (HOSPITAL_COMMUNITY): Payer: Self-pay

## 2022-04-14 DIAGNOSIS — R112 Nausea with vomiting, unspecified: Secondary | ICD-10-CM | POA: Insufficient documentation

## 2022-04-14 DIAGNOSIS — N390 Urinary tract infection, site not specified: Secondary | ICD-10-CM

## 2022-04-14 DIAGNOSIS — E1159 Type 2 diabetes mellitus with other circulatory complications: Secondary | ICD-10-CM

## 2022-04-14 DIAGNOSIS — E871 Hypo-osmolality and hyponatremia: Secondary | ICD-10-CM | POA: Insufficient documentation

## 2022-04-14 DIAGNOSIS — R197 Diarrhea, unspecified: Secondary | ICD-10-CM | POA: Insufficient documentation

## 2022-04-14 DIAGNOSIS — J9601 Acute respiratory failure with hypoxia: Secondary | ICD-10-CM

## 2022-04-14 DIAGNOSIS — R748 Abnormal levels of other serum enzymes: Secondary | ICD-10-CM | POA: Insufficient documentation

## 2022-04-14 DIAGNOSIS — N179 Acute kidney failure, unspecified: Secondary | ICD-10-CM | POA: Diagnosis present

## 2022-04-14 HISTORY — DX: Urinary tract infection, site not specified: N39.0

## 2022-04-14 LAB — GASTROINTESTINAL PANEL BY PCR, STOOL (REPLACES STOOL CULTURE)

## 2022-04-14 LAB — HEPATIC FUNCTION PANEL
ALT: 45 U/L — ABNORMAL HIGH (ref 0–44)
AST: 75 U/L — ABNORMAL HIGH (ref 15–41)
Albumin: 3 g/dL — ABNORMAL LOW (ref 3.5–5.0)
Alkaline Phosphatase: 79 U/L (ref 38–126)
Bilirubin, Direct: 1.7 mg/dL — ABNORMAL HIGH (ref 0.0–0.2)
Indirect Bilirubin: 1.1 mg/dL — ABNORMAL HIGH (ref 0.3–0.9)
Total Bilirubin: 2.8 mg/dL — ABNORMAL HIGH (ref 0.3–1.2)
Total Protein: 6.8 g/dL (ref 6.5–8.1)

## 2022-04-14 LAB — BASIC METABOLIC PANEL
Anion gap: 15 (ref 5–15)
Anion gap: 14 (ref 5–15)
Anion gap: 14 (ref 5–15)
Anion gap: 12 (ref 5–15)
BUN: 42 mg/dL — ABNORMAL HIGH (ref 6–20)
BUN: 41 mg/dL — ABNORMAL HIGH (ref 6–20)
BUN: 42 mg/dL — ABNORMAL HIGH (ref 6–20)
BUN: 41 mg/dL — ABNORMAL HIGH (ref 6–20)
CO2: 22 mmol/L (ref 22–32)
CO2: 21 mmol/L — ABNORMAL LOW (ref 22–32)
CO2: 19 mmol/L — ABNORMAL LOW (ref 22–32)
CO2: 22 mmol/L (ref 22–32)
Calcium: 8 mg/dL — ABNORMAL LOW (ref 8.9–10.3)
Calcium: 7.7 mg/dL — ABNORMAL LOW (ref 8.9–10.3)
Calcium: 7.6 mg/dL — ABNORMAL LOW (ref 8.9–10.3)
Calcium: 7.5 mg/dL — ABNORMAL LOW (ref 8.9–10.3)
Chloride: 89 mmol/L — ABNORMAL LOW (ref 98–111)
Chloride: 92 mmol/L — ABNORMAL LOW (ref 98–111)
Chloride: 93 mmol/L — ABNORMAL LOW (ref 98–111)
Chloride: 92 mmol/L — ABNORMAL LOW (ref 98–111)
Creatinine, Ser: 2.09 mg/dL — ABNORMAL HIGH (ref 0.61–1.24)
Creatinine, Ser: 1.93 mg/dL — ABNORMAL HIGH (ref 0.61–1.24)
Creatinine, Ser: 1.93 mg/dL — ABNORMAL HIGH (ref 0.61–1.24)
Creatinine, Ser: 1.85 mg/dL — ABNORMAL HIGH (ref 0.61–1.24)
GFR, Estimated: 36 mL/min — ABNORMAL LOW (ref >60)
GFR, Estimated: 40 mL/min — ABNORMAL LOW (ref >60)
GFR, Estimated: 40 mL/min — ABNORMAL LOW (ref >60)
GFR, Estimated: 42 mL/min — ABNORMAL LOW (ref >60)
Glucose, Bld: 107 mg/dL — ABNORMAL HIGH (ref 70–99)
Glucose, Bld: 107 mg/dL — ABNORMAL HIGH (ref 70–99)
Glucose, Bld: 95 mg/dL (ref 70–99)
Glucose, Bld: 61 mg/dL — ABNORMAL LOW (ref 70–99)
Potassium: 3.5 mmol/L (ref 3.5–5.1)
Potassium: 3.4 mmol/L — ABNORMAL LOW (ref 3.5–5.1)
Potassium: 3.2 mmol/L — ABNORMAL LOW (ref 3.5–5.1)
Potassium: 3.3 mmol/L — ABNORMAL LOW (ref 3.5–5.1)
Sodium: 126 mmol/L — ABNORMAL LOW (ref 135–145)
Sodium: 127 mmol/L — ABNORMAL LOW (ref 135–145)
Sodium: 126 mmol/L — ABNORMAL LOW (ref 135–145)
Sodium: 126 mmol/L — ABNORMAL LOW (ref 135–145)

## 2022-04-14 LAB — URINALYSIS, ROUTINE W REFLEX MICROSCOPIC
Glucose, UA: >=500 mg/dL — AB
Ketones, ur: NEGATIVE mg/dL
Nitrite: NEGATIVE
Protein, ur: 100 mg/dL — AB
Specific Gravity, Urine: 1.018 (ref 1.005–1.030)
WBC, UA: >50 WBC/hpf (ref 0–5)
pH: 5 (ref 5.0–8.0)

## 2022-04-14 LAB — C DIFFICILE QUICK SCREEN W PCR REFLEX
C Diff antigen: NEGATIVE
C Diff interpretation: NOT DETECTED
C Diff toxin: NEGATIVE

## 2022-04-14 LAB — HEMOGLOBIN A1C
Hgb A1c MFr Bld: 7.8 % — ABNORMAL HIGH (ref 4.8–5.6)
Mean Plasma Glucose: 177.16 mg/dL

## 2022-04-14 LAB — BRAIN NATRIURETIC PEPTIDE: B Natriuretic Peptide: 558.8 pg/mL — ABNORMAL HIGH (ref 0.0–100.0)

## 2022-04-14 LAB — CBC
HCT: 32.1 % — ABNORMAL LOW (ref 39.0–52.0)
Hemoglobin: 10.8 g/dL — ABNORMAL LOW (ref 13.0–17.0)
MCH: 28.1 pg (ref 26.0–34.0)
MCHC: 33.6 g/dL (ref 30.0–36.0)
MCV: 83.6 fL (ref 80.0–100.0)
Platelets: 143 10*3/uL — ABNORMAL LOW (ref 150–400)
RBC: 3.84 MIL/uL — ABNORMAL LOW (ref 4.22–5.81)
RDW: 14.7 % (ref 11.5–15.5)
WBC: 10.3 10*3/uL (ref 4.0–10.5)
nRBC: 0 % (ref 0.0–0.2)

## 2022-04-14 LAB — GLUCOSE, CAPILLARY
Glucose-Capillary: 104 mg/dL — ABNORMAL HIGH (ref 70–99)
Glucose-Capillary: 123 mg/dL — ABNORMAL HIGH (ref 70–99)
Glucose-Capillary: 90 mg/dL (ref 70–99)
Glucose-Capillary: 55 mg/dL — ABNORMAL LOW (ref 70–99)
Glucose-Capillary: 66 mg/dL — ABNORMAL LOW (ref 70–99)
Glucose-Capillary: 75 mg/dL (ref 70–99)
Glucose-Capillary: 73 mg/dL (ref 70–99)

## 2022-04-14 LAB — HEPATITIS PANEL, ACUTE
HCV Ab: NONREACTIVE
Hep A IgM: NONREACTIVE
Hep B C IgM: NONREACTIVE
Hepatitis B Surface Ag: NONREACTIVE

## 2022-04-14 LAB — OSMOLALITY: Osmolality: 275 mOsm/kg (ref 275–295)

## 2022-04-14 LAB — HIV ANTIBODY (ROUTINE TESTING W REFLEX): HIV Screen 4th Generation wRfx: NONREACTIVE

## 2022-04-14 LAB — D-DIMER, QUANTITATIVE: D-Dimer, Quant: 7.46 ug/mL-FEU — ABNORMAL HIGH (ref 0.00–0.50)

## 2022-04-14 LAB — SODIUM, URINE, RANDOM: Sodium, Ur: <10 mmol/L

## 2022-04-14 LAB — CREATININE, URINE, RANDOM: Creatinine, Urine: 251 mg/dL

## 2022-04-14 MED ORDER — SODIUM CHLORIDE 0.9 % IV SOLN
1.0000 g | Freq: Once | INTRAVENOUS | Status: AC
  Administered 2022-04-14: 1 g via INTRAVENOUS
  Filled 2022-04-14: qty 10

## 2022-04-14 MED ORDER — ASPIRIN 81 MG PO TBEC: 81.0000 mg | DELAYED_RELEASE_TABLET | Freq: Every day | ORAL | Status: DC

## 2022-04-14 MED ORDER — ATORVASTATIN CALCIUM 20 MG PO TABS
20.0000 mg | ORAL_TABLET | Freq: Every day | ORAL | Status: DC
  Administered 2022-04-14 – 2022-04-22 (×9): 20 mg via ORAL
  Filled 2022-04-14 (×9): qty 1

## 2022-04-14 MED ORDER — POTASSIUM CHLORIDE CRYS ER 20 MEQ PO TBCR
40.0000 meq | EXTENDED_RELEASE_TABLET | Freq: Once | ORAL | Status: AC
  Administered 2022-04-14: 40 meq via ORAL
  Filled 2022-04-14: qty 2

## 2022-04-14 MED ORDER — LACTATED RINGERS IV SOLN: INTRAVENOUS | Status: DC

## 2022-04-14 MED ORDER — INSULIN ASPART 100 UNIT/ML IJ SOLN
0.0000 [IU] | Freq: Three times a day (TID) | INTRAMUSCULAR | Status: DC
  Administered 2022-04-14: 1 [IU] via SUBCUTANEOUS
  Administered 2022-04-16 (×2): 3 [IU] via SUBCUTANEOUS
  Administered 2022-04-16: 2 [IU] via SUBCUTANEOUS
  Administered 2022-04-17: 3 [IU] via SUBCUTANEOUS
  Administered 2022-04-17: 5 [IU] via SUBCUTANEOUS
  Administered 2022-04-17 – 2022-04-18 (×3): 3 [IU] via SUBCUTANEOUS
  Administered 2022-04-18: 5 [IU] via SUBCUTANEOUS
  Administered 2022-04-19 (×2): 3 [IU] via SUBCUTANEOUS
  Administered 2022-04-19: 5 [IU] via SUBCUTANEOUS
  Administered 2022-04-20 (×2): 3 [IU] via SUBCUTANEOUS
  Administered 2022-04-21: 5 [IU] via SUBCUTANEOUS
  Administered 2022-04-21: 2 [IU] via SUBCUTANEOUS
  Administered 2022-04-21: 3 [IU] via SUBCUTANEOUS
  Administered 2022-04-22: 5 [IU] via SUBCUTANEOUS
  Administered 2022-04-22: 3 [IU] via SUBCUTANEOUS
  Filled 2022-04-14: qty 0.09

## 2022-04-14 MED ORDER — SODIUM CHLORIDE 0.9 % IV SOLN: 1.0000 g | INTRAVENOUS | Status: DC

## 2022-04-14 MED ORDER — TECHNETIUM TO 99M ALBUMIN AGGREGATED
4.4000 | Freq: Once | INTRAVENOUS | Status: AC | PRN
  Administered 2022-04-14: 4.4 via INTRAVENOUS

## 2022-04-14 MED ORDER — ENOXAPARIN SODIUM 40 MG/0.4ML IJ SOSY
40.0000 mg | PREFILLED_SYRINGE | INTRAMUSCULAR | Status: DC
  Administered 2022-04-14 – 2022-04-15 (×2): 40 mg via SUBCUTANEOUS
  Filled 2022-04-14 (×2): qty 0.4

## 2022-04-14 MED ORDER — PROCHLORPERAZINE EDISYLATE 10 MG/2ML IJ SOLN
5.0000 mg | Freq: Four times a day (QID) | INTRAMUSCULAR | Status: DC | PRN
  Administered 2022-04-14 – 2022-04-16 (×2): 5 mg via INTRAVENOUS
  Filled 2022-04-14 (×3): qty 2

## 2022-04-14 MED ORDER — AMLODIPINE BESYLATE 10 MG PO TABS
10.0000 mg | ORAL_TABLET | Freq: Every day | ORAL | Status: DC
  Administered 2022-04-14 – 2022-04-22 (×9): 10 mg via ORAL
  Filled 2022-04-14 (×9): qty 1

## 2022-04-14 MED ORDER — OXYCODONE HCL 5 MG PO TABS
5.0000 mg | ORAL_TABLET | Freq: Four times a day (QID) | ORAL | Status: DC | PRN
  Administered 2022-04-14 – 2022-04-15 (×3): 5 mg via ORAL
  Filled 2022-04-14 (×3): qty 1

## 2022-04-14 MED ORDER — INSULIN ASPART 100 UNIT/ML IJ SOLN
0.0000 [IU] | Freq: Every day | INTRAMUSCULAR | Status: DC
  Administered 2022-04-16 – 2022-04-18 (×3): 2 [IU] via SUBCUTANEOUS
  Administered 2022-04-19: 3 [IU] via SUBCUTANEOUS
  Administered 2022-04-21: 2 [IU] via SUBCUTANEOUS
  Filled 2022-04-14: qty 0.05

## 2022-04-14 NOTE — ED Provider Notes (Signed)
[facility] Provider Note  CSN: [id number] Arrival date & time: 04/13/22 2211  Chief Complaint(s) Emesis  HPI COLTRANE TUGWELL is a 57 y.o. male with a past medical history listed below including hypertension, hyperlipidemia, diabetes who presents to the emergency department for generalized fatigue.  Patient reports 1 week of nausea, vomiting and diarrhea which has seemed to be improving.  He states that he is now able to tolerate some p.o. intake.  However he feels dehydrated and fatigued.  He denied any suspicious food intake, recent antibiotic use or travel.  He denies any associated chest pain or shortness of breath.  No abdominal pain currently or during the week.  No urinary symptoms other than his urine being darker than normal.  The history is provided by the patient.    Past Medical History Past Medical History:  Diagnosis Date   Diabetes mellitus without complication ([place])    Hyperlipidemia    Hypertension    Obesity    Patient Active Problem List   Diagnosis Date Noted   Osteomyelitis of second toe of left foot ([place]) 12/26/2020   Acute osteomyelitis of metatarsal bone of left foot ([place]) 12/26/2020   Healthcare maintenance 10/27/2020   Planned postoperative wound closure    Cellulitis and abscess of toe of left foot    Diabetic foot infection ([place])    Abscess of left foot    Osteomyelitis ([place]) 09/21/2020   Type 2 diabetes mellitus with foot ulcer ([place]) 01/19/2015   Hyperlipidemia 06/12/2011   Hypertension 06/12/2011   Home Medication(s) Prior to Admission medications   Medication Sig Start Date End Date Taking? Authorizing Provider  amLODipine (NORVASC) 10 MG tablet Take 1 tablet (10 mg total) by mouth daily. 06/09/21  Yes Eppie Gibson, MD  aspirin EC 81 MG tablet Take 1 tablet (81 mg total) by mouth daily. Swallow whole. 10/27/20  Yes Eppie Gibson, MD  atorvastatin (LIPITOR) 20 MG tablet Take 1 tablet by mouth once  daily 11/24/21  Yes Sowell, Erlene Quan, MD  glipiZIDE (GLUCOTROL XL) 5 MG 24 hr tablet Take 1 tablet (5 mg total) by mouth daily. 10/03/21  Yes Eppie Gibson, MD  metFORMIN (GLUCOPHAGE-XR) 500 MG 24 hr tablet TAKE 2 TABLETS BY MOUTH TWICE DAILY WITH A MEAL 03/10/22  Yes Eppie Gibson, MD  Multiple Vitamin (MULTIVITAMIN WITH MINERALS) TABS tablet Take 1 tablet by mouth daily. 12/31/20  Yes Hongalgi, Lenis Dickinson, MD  acetaminophen (TYLENOL) 325 MG tablet Take 2 tablets (650 mg total) by mouth every 6 (six) hours as needed for mild pain or fever. Patient not taking: Reported on 04/14/2022 09/28/20   Ezequiel Essex, MD  blood glucose meter kit and supplies KIT Dispense based on patient and insurance preference. Use up to four times daily as directed. (FOR ICD-9 250.00, 250.01). 01/12/15   Davonna Belling, MD  losartan (COZAAR) 50 MG tablet Take 1 tablet (50 mg total) by mouth daily. Patient not taking: Reported on 04/14/2022 03/11/21   Eppie Gibson, MD  Allergies Morphine and related  Review of Systems Review of Systems As noted in HPI  Physical Exam Vital Signs  I have reviewed the triage vital signs BP (!) 148/70   Pulse 88   Temp 98 F (36.7 C) (Oral)   Resp 15   SpO2 95%   Physical Exam Vitals reviewed.  Constitutional:      General: He is not in acute distress.    Appearance: He is well-developed. He is ill-appearing. He is not diaphoretic.  HENT:     Head: Normocephalic and atraumatic.     Right Ear: External ear normal.     Left Ear: External ear normal.     Nose: Nose normal.     Mouth/Throat:     Mouth: Mucous membranes are moist.  Eyes:     General: No scleral icterus.    Conjunctiva/sclera: Conjunctivae normal.  Neck:     Trachea: Phonation normal.  Cardiovascular:     Rate and Rhythm: Normal rate and regular rhythm.  Pulmonary:      Effort: Pulmonary effort is normal. No respiratory distress.     Breath sounds: No stridor.  Abdominal:     General: There is no distension.     Tenderness: There is no abdominal tenderness. There is no guarding or rebound.  Musculoskeletal:        General: Normal range of motion.     Cervical back: Normal range of motion.  Neurological:     Mental Status: He is alert and oriented to person, place, and time.  Psychiatric:        Behavior: Behavior normal.     ED Results and Treatments Labs (all labs ordered are listed, but only abnormal results are displayed) Labs Reviewed  COMPREHENSIVE METABOLIC PANEL - Abnormal; Notable for the following components:      Result Value   Sodium 123 (*)    Chloride 86 (*)    Glucose, Bld 269 (*)    BUN 33 (*)    Creatinine, Ser 2.09 (*)    Calcium 8.0 (*)    Albumin 3.1 (*)    AST 72 (*)    ALT 46 (*)    Total Bilirubin 3.4 (*)    GFR, Estimated 36 (*)    All other components within normal limits  CBC - Abnormal; Notable for the following components:   WBC 11.5 (*)    RBC 3.86 (*)    Hemoglobin 11.2 (*)    HCT 32.2 (*)    Platelets 148 (*)    All other components within normal limits  URINALYSIS, ROUTINE W REFLEX MICROSCOPIC - Abnormal; Notable for the following components:   Color, Urine AMBER (*)    APPearance CLOUDY (*)    Glucose, UA >=500 (*)    Hgb urine dipstick MODERATE (*)    Bilirubin Urine SMALL (*)    Protein, ur 100 (*)    Leukocytes,Ua MODERATE (*)    Bacteria, UA MANY (*)    All other components within normal limits  RESP PANEL BY RT-PCR (RSV, FLU A&B, COVID)  RVPGX2  LIPASE, BLOOD  EKG   EKG Interpretation  Date/Time:  Friday April 14 2022 01:10:56 EST Ventricular Rate:  85 PR Interval:  197 QRS Duration: 96 QT Interval:  407 QTC Calculation: 484 R Axis:   -3 Text Interpretation: Sinus  rhythm Borderline prolonged QT interval When compared with ECG of 12/24/2020, No significant change was found Reconfirmed by Addison Lank [phone]) on 04/14/2022 2:58:47 AM       Radiology US Abdomen Limited RUQ (LIVER/GB)  Result Date: 04/14/2022 CLINICAL DATA:  Nausea and vomiting for several days EXAM: ULTRASOUND ABDOMEN LIMITED RIGHT UPPER QUADRANT COMPARISON:  None Available. FINDINGS: Gallbladder: No gallstones or wall thickening visualized. No sonographic Murphy sign noted by sonographer. Common bile duct: Diameter: 3 mm Liver: Increased in echogenicity consistent with fatty infiltration. No focal mass is noted. Portal vein is patent on color Doppler imaging with normal direction of blood flow towards the liver. Other: None. IMPRESSION: Fatty liver without acute abnormality. Electronically Signed   By: Inez Catalina M.D.   On: 04/14/2022 00:52    Medications Ordered in ED Medications  sodium chloride 0.9 % bolus 1,000 mL (0 mLs Intravenous Stopped 04/14/22 0243)    Followed by  0.9 %  sodium chloride infusion (1,000 mLs Intravenous New Bag/Given 04/14/22 0019)  cefTRIAXone (ROCEPHIN) 1 g in sodium chloride 0.9 % 100 mL IVPB (has no administration in time range)                                                                                                                                     Procedures Procedures  (including critical care time)  Medical Decision Making / ED Course   Medical Decision Making Amount and/or Complexity of Data Reviewed Labs: ordered. Decision-making details documented in ED Course. Radiology: ordered. ECG/medicine tests: ordered and independent interpretation performed. Decision-making details documented in ED Course.  Risk Prescription drug management. Decision regarding hospitalization.    1 week of nausea, vomiting, diarrhea currently fatigue and feeling dehydrated. Will assess for electrolyte/metabolic derangements including DKA and AKI.  Abdomen is  benign so I have low suspicion for serious intra-abdominal inflammatory/infectious process requiring imaging at this time.  CBC with mild leukocytosis.  Mild anemia with stable hemoglobin. Metabolic panel with hyperglycemia without evidence of DKA.  Patient has hyponatremia at 123 possibly contributing to his nausea and vomiting.  Patient also has evidence of AKI with a creatinine of 2.09 which is up from 0.81 seven months ago.  Patient has mild transaminitis with elevated bilirubin.  Right upper quadrant ultrasound was obtained and negative for any biliary disease.  No evidence of pancreatitis.  Patient started on IV fluids. Discussed case with Dr. Marlowe Sax from the hospitalist service who read to admit patient for further management.  UA pending at time of admission.  5:02 AM UA consistent with UTI - started on CTX. Dr. Marlowe Sax updated.      Final Clinical Impression(s) / ED Diagnoses Final  diagnoses:  Nausea vomiting and diarrhea  AKI (acute kidney injury) (HCC)  Acute lower UTI           This chart was dictated using voice recognition software.  Despite best efforts to proofread,  errors can occur which can change the documentation meaning.    Nira Conn, MD 04/14/22 [phone]

## 2022-04-14 NOTE — H&P (Signed)
History and Physical    Carl [name] PIR:[id number] DOB: 22-Nov-1965 DOA: 04/13/2022  PCP: Eppie Gibson, MD  Patient coming from: Home  Chief Complaint: Vomiting, diarrhea  HPI: Carl [name] is a 57 y.o. 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  In the ED, oxygen saturation dropped to 88% on room air and placed on 2 L [place]. Vital signs stable. Labs significant for WBC 11.5, hemoglobin 11.2 (at baseline), platelet count 148k, sodium 123, chloride 86, glucose 269, bicarb 23, BUN 33, creatinine 2.0 (baseline 0.4-0.8), T. bili 3.4, AST 72, ALT 46, alk phos normal, lipase normal.  UA with moderate leukocytes and microscopy showing 11-20 RBCs,>50 WBCs, and many bacteria. COVID/influenza/RSV PCR negative.  Right upper quadrant ultrasound showing fatty liver without acute abnormality. Patient was given ceftriaxone, 1 L normal saline bolus, and started on normal saline infusion at 125 mL/h.  Patient reports 6-day history of nausea, vomiting, and diarrhea.  Symptoms initially improved and he was able to eat crackers and drink soup but he felt bad again yesterday.  Felt weak and dizzy and came into the emergency room.  Reporting nonbloody nonbilious emesis and nonbloody diarrhea.  Denies recent antibiotic use.  Denies fevers, abdominal or flank pain.  Denies recent sick contacts or trying any new food.  Denies cough, shortness of breath, or chest pain.  Review of Systems:  Review of Systems  All other systems reviewed and are negative.   Past Medical History:  Diagnosis Date   Diabetes mellitus without complication ([place])    Hyperlipidemia    Hypertension    Obesity     Past Surgical History:  Procedure Laterality Date   AMPUTATION Left 12/27/2020   Procedure: LEFT TRANSMETATARSAL AMPUTATION;  Surgeon: Lorenda Peck, MD;   Location: [facility];  Service: Podiatry;  Laterality: Left;   AMPUTATION TOE Left 09/22/2020   HAND SURGERY     INCISION AND DRAINAGE Left 09/22/2020   Procedure: INCISION AND DRAINAGE LEFT FOOT WITH REMOVAL OF ALL NON VIABLE SOFT TISSUE AND BONE INCLUDING THRID TOE AMPUTATION;  Surgeon: Evelina Bucy, DPM;  Location: [facility];  Service: Podiatry;  Laterality: Left;   VASECTOMY     WOUND DEBRIDEMENT Left 09/26/2020   Procedure: DEBRIDEMENT WOUND LEFT FOOT;  Surgeon: Evelina Bucy, DPM;  Location: [facility];  Service: Podiatry;  Laterality: Left;     reports that he quit smoking about 37 years ago. His smoking use included cigarettes. He has a 1.00 pack-year smoking history. He has never used smokeless tobacco. He reports current alcohol use. He reports that he does not use drugs.  Allergies  Allergen Reactions   Morphine And Related Other (See Comments)    hallucinations    Family History  Problem Relation Age of Onset   Diabetes Mother    Hypertension Mother    Hypertension Father     Prior to Admission medications   Medication Sig Start Date End Date Taking? Authorizing Provider  amLODipine (NORVASC) 10 MG tablet Take 1 tablet (10 mg total) by mouth daily. 06/09/21  Yes Eppie Gibson, MD  aspirin EC 81 MG tablet Take 1 tablet (81 mg total) by mouth daily. Swallow whole. 10/27/20  Yes Eppie Gibson, MD  atorvastatin (LIPITOR) 20 MG tablet Take 1 tablet by mouth once daily 11/24/21  Yes Sowell, Erlene Quan, MD  glipiZIDE (GLUCOTROL XL) 5  MG 24 hr tablet Take 1 tablet (5 mg total) by mouth daily. 10/03/21  Yes Eppie Gibson, MD  metFORMIN (GLUCOPHAGE-XR) 500 MG 24 hr tablet TAKE 2 TABLETS BY MOUTH TWICE DAILY WITH A MEAL 03/10/22  Yes Eppie Gibson, MD  Multiple Vitamin (MULTIVITAMIN WITH MINERALS) TABS tablet Take 1 tablet by mouth daily. 12/31/20  Yes Hongalgi, Lenis Dickinson, MD  acetaminophen (TYLENOL) 325 MG tablet Take 2 tablets (650 mg total) by mouth every 6 (six) hours as needed for  mild pain or fever. Patient not taking: Reported on 04/14/2022 09/28/20   Ezequiel Essex, MD  blood glucose meter kit and supplies KIT Dispense based on patient and insurance preference. Use up to four times daily as directed. (FOR ICD-9 250.00, 250.01). 01/12/15   Davonna Belling, MD  losartan (COZAAR) 50 MG tablet Take 1 tablet (50 mg total) by mouth daily. Patient not taking: Reported on 04/14/2022 03/11/21   Eppie Gibson, MD    Physical Exam: Vitals:   04/13/22 2219 04/14/22 0130 04/14/22 0200 04/14/22 0330  BP: 114/74 119/73 (!) 110/53 124/65  Pulse: 84 81 85 89  Resp: 18 (!) 44 (!) 36 11  Temp: 98.1 F (36.7 C)     TempSrc: Oral     SpO2: 97% 92% 97% 92%    Physical Exam Vitals reviewed.  Constitutional:      General: He is not in acute distress. Eyes:     Extraocular Movements: Extraocular movements intact.  Cardiovascular:     Rate and Rhythm: Normal rate and regular rhythm.     Pulses: Normal pulses.  Pulmonary:     Effort: Pulmonary effort is normal. No respiratory distress.     Breath sounds: Normal breath sounds. No wheezing or rales.  Abdominal:     General: Bowel sounds are normal.     Palpations: Abdomen is soft.     Tenderness: There is no abdominal tenderness. There is no guarding.  Musculoskeletal:     Cervical back: Normal range of motion.     Right lower leg: No edema.     Left lower leg: No edema.  Skin:    General: Skin is warm and dry.  Neurological:     General: No focal deficit present.     Mental Status: He is alert and oriented to person, place, and time.     Labs on Admission: I have personally reviewed following labs and imaging studies  CBC: Recent Labs  Lab 04/13/22 2226  WBC 11.5*  HGB 11.2*  HCT 32.2*  MCV 83.4  PLT 443*   Basic Metabolic Panel: Recent Labs  Lab 04/13/22 2226  NA 123*  K 3.6  CL 86*  CO2 23  GLUCOSE 269*  BUN 33*  CREATININE 2.09*  CALCIUM 8.0*   GFR: CrCl cannot be calculated (Unknown ideal  weight.). Liver Function Tests: Recent Labs  Lab 04/13/22 2226  AST 72*  ALT 46*  ALKPHOS 83  BILITOT 3.4*  PROT 6.8  ALBUMIN 3.1*   Recent Labs  Lab 04/13/22 2226  LIPASE 43   No results for input(s): "AMMONIA" in the last 168 hours. Coagulation Profile: No results for input(s): "INR", "PROTIME" in the last 168 hours. Cardiac Enzymes: No results for input(s): "CKTOTAL", "CKMB", "CKMBINDEX", "TROPONINI" in the last 168 hours. BNP (last 3 results) No results for input(s): "PROBNP" in the last 8760 hours. HbA1C: No results for input(s): "HGBA1C" in the last 72 hours. CBG: No results for input(s): "GLUCAP" in the last  168 hours. Lipid Profile: No results for input(s): "CHOL", "HDL", "LDLCALC", "TRIG", "CHOLHDL", "LDLDIRECT" in the last 72 hours. Thyroid Function Tests: No results for input(s): "TSH", "T4TOTAL", "FREET4", "T3FREE", "THYROIDAB" in the last 72 hours. Anemia Panel: No results for input(s): "VITAMINB12", "FOLATE", "FERRITIN", "TIBC", "IRON", "RETICCTPCT" in the last 72 hours. Urine analysis:    Component Value Date/Time   COLORURINE STRAW (A) 12/24/2020 2028   APPEARANCEUR CLEAR 12/24/2020 2028   LABSPEC 1.010 12/24/2020 2028   PHURINE 5.0 12/24/2020 2028   GLUCOSEU NEGATIVE 12/24/2020 2028   HGBUR NEGATIVE 12/24/2020 2028   BILIRUBINUR NEGATIVE 12/24/2020 2028   BILIRUBINUR neg 03/21/2013 1003   KETONESUR NEGATIVE 12/24/2020 2028   PROTEINUR NEGATIVE 12/24/2020 2028   UROBILINOGEN 0.2 08/17/2015 1548   NITRITE NEGATIVE 12/24/2020 2028   LEUKOCYTESUR NEGATIVE 12/24/2020 2028    Radiological Exams on Admission: US Abdomen Limited RUQ (LIVER/GB)  Result Date: 04/14/2022 CLINICAL DATA:  Nausea and vomiting for several days EXAM: ULTRASOUND ABDOMEN LIMITED RIGHT UPPER QUADRANT COMPARISON:  None Available. FINDINGS: Gallbladder: No gallstones or wall thickening visualized. No sonographic Murphy sign noted by sonographer. Common bile duct: Diameter: 3 mm  Liver: Increased in echogenicity consistent with fatty infiltration. No focal mass is noted. Portal vein is patent on color Doppler imaging with normal direction of blood flow towards the liver. Other: None. IMPRESSION: Fatty liver without acute abnormality. Electronically Signed   By: Alcide Clever M.D.   On: 04/14/2022 00:52    EKG: Independently reviewed.  Sinus rhythm, no significant change since prior tracing.  Assessment and Plan  AKI Likely prerenal from dehydration in the setting of vomiting and diarrhea. BUN 33, creatinine 2.0 (baseline 0.4-0.8). -Continue IV fluid hydration and monitor renal function -Avoid nephrotoxic agents/hold home losartan -Check urine sodium and creatinine  Hyponatremia In the setting of diarrhea/vomiting/poor p.o. intake.  Sodium 123, previously 134 in June 2023. -Continue IV fluid-hydration -Check serum osmolarity -BMP every 4 hours and adjust rate of fluids accordingly -Goal rate of correction 4 to 6 mEq in 24 hours, avoid rapid correction  Nausea and vomiting/possible UTI UA with evidence of pyuria and bacteriuria.  COVID and flu negative.  No abdominal or flank pain.  Lipase normal.  Liver enzymes elevated but right upper quadrant ultrasound negative for acute finding. -Continue ceftriaxone -Add on urine culture -Antiemetic as needed  Elevated liver enzymes T. bili 3.4, AST 72, ALT 46, alk phos normal.  No history of heavy alcohol use.  Right upper quadrant ultrasound showing fatty liver without acute abnormality. -Monitor LFTs  Diarrhea No abdominal pain.  No fever or significant leukocytosis.  COVID and flu negative. -C. difficile PCR and GI pathogen panel, enteric precautions  Acute hypoxemic respiratory failure Oxygen saturation 88% on room air, currently satting well on 2 L [place].  Lungs clear on exam. -Stat chest x-ray -Continue supplemental oxygen  Type 2 diabetes Glucose in the 200s.  A1c 7.2 in October 2022. -Repeat A1c -Sensitive  sliding scale insulin -Hold glipizide and metformin  Hypertension Stable. -Continue amlodipine -Hold losartan given AKI  Hyperlipidemia -Continue Lipitor  DVT prophylaxis: Lovenox Code Status: Full Code (discussed with the patient) Level of care: Telemetry bed Admission status: It is my clinical opinion that referral for OBSERVATION is reasonable and necessary in this patient based on the above information provided. The aforementioned taken together are felt to place the patient at high risk for further clinical deterioration. However, it is anticipated that the patient may be medically stable for discharge from the  hospital within 24 to 48 hours.   Shela Leff MD [facility]  If 7PM-7AM, please contact night-coverage [email or web address]  04/14/2022, 4:25 AM

## 2022-04-14 NOTE — Progress Notes (Signed)
Progress Note   Patient: Carl [name] KXF:[id number] DOB: Dec 30, 1965 DOA: 04/13/2022     0 DOS: the patient was seen and examined on 04/14/2022   Brief hospital course: 57 year old male PMH including diabetes mellitus type 2 presented with 6-day history nausea, vomiting and diarrhea without suspicious history or factors reported.  Admitted for AKI, hyponatremia, nausea, vomiting, diarrhea, elevated liver enzymes.  Assessment and Plan: AKI FENa 0.1%, likely prerenal from dehydration in the setting of vomiting and diarrhea. BUN 33, creatinine 2.0 (baseline 0.4-0.8).  Urinalysis with proteinuria, hematuria, granular and hyaline casts. Hold home Cozaar and metformin.  Not on any diuretics. No response at IV fluids, will trend BMP.  Will discuss informally with nephrology.  Strict I/O.   Hyponatremia, likely hypoosmolar In the setting of diarrhea/vomiting/poor p.o. intake.  Sodium 123, previously 134 in June 2023. BMP every 4 hours and adjust rate of fluids accordingly.  Avoid rapid correction.   Nausea, vomiting and diarrhea. Abnormal urinalysis, microscopic hematuria, pyuria, proteinuria with moderate leukocytes and microscopy showing 11-20 RBCs,>50 WBC  COVID and flu negative.  No abdominal or flank pain.  Abdominal ultrasound with no acute findings. No urinary symptoms at home.  UTI doubted.  Urine culture has been sent.  Will stop antibiotics and monitor.   Elevated liver enzymes On admission: T. bili 3.4, AST 72, ALT 46, alk phos normal.  No history of heavy alcohol use.  Right upper quadrant ultrasound showing fatty liver without acute abnormality. Etiology unclear.  Total bilirubin better today, may be from dehydration.  Transaminases probably from fatty liver.   Stop statin.  Trend CMP.  Check hepatitis panel.   Diarrhea No abdominal pain.  No fever or significant leukocytosis.  COVID and flu negative. C. difficile negative.  GI pathogen panel in progress.  Continue enteric  precautions   Hypoxia without respiratory distress.   Acute hypoxemic respiratory failure considered on admission but no signs or symptoms or distress, therefore ruled out. Chest x-ray no acute disease.  Oxygen saturation 88% on room air, currently satting well on 2 L [place].  Lungs clear on exam.  Activity has been significantly decreased with generalized weakness for the last 10 days.  No lower extremity edema.  Start with D-dimer.  Suspicion for PE is low. May be from obesity.  Monitor.   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 Hemoglobin A1c 7.8 Hold glipizide and metformin  Normocytic anemia Hemoglobin 10.8, stable compared to values from last year.  Appears to be at baseline.  Thrombocytopenia [place].  Platelets 143.  Trend CBC.  Essential hypertension Soft.  Hold antihypertensives.   Hyperlipidemia Stop statin given elevated transaminases.     Subjective:  Feels about the same Has had multiple episodes of diarrhea Would like to try some food Sick for 1.5 weeks No previous GI issues No drugs, no alcohol Remote smoker  Physical Exam: Vitals:   04/14/22 0437 04/14/22 0500 04/14/22 0530 04/14/22 0635  BP:  121/89 94/62 125/69  Pulse:  87 87 88  Resp:    18  Temp: 98 F (36.7 C)   98.3 F (36.8 C)  TempSrc: Oral   Oral  SpO2:  95% 97% 94%   Physical Exam Vitals reviewed.  Constitutional:      General: He is not in acute distress.    Appearance: He is not ill-appearing or toxic-appearing.     Comments: Sitting in chair  Cardiovascular:     Rate  and Rhythm: Normal rate and regular rhythm.     Heart sounds: No murmur heard. Pulmonary:     Effort: Pulmonary effort is normal. No respiratory distress.     Breath sounds: No wheezing, rhonchi or rales.  Abdominal:     General: There is no distension.     Palpations: Abdomen is soft.     Tenderness: There is no abdominal tenderness.   Musculoskeletal:     Right lower leg: No edema.     Left lower leg: No edema.  Neurological:     Mental Status: He is alert.  Psychiatric:        Mood and Affect: Mood normal.        Behavior: Behavior normal.     Data Reviewed: Sodium 123 > 126 Chloride 86 > 89 BUN up to 42 Creatinine no change 2.09 AST and ALT stable at 75 and 45 respectively Total bilirubin down to 2.8 Hemoglobin 10.8, stable compared to values from last year.  Appears to be at baseline. EKG and CXR no acute changes  Family Communication: none requested  Disposition: Status is: Observation   Planned Discharge Destination: Home    Time spent: 35 minutes  Author: Murray Hodgkins, MD 04/14/2022 8:02 AM  For on call review [email or web address].

## 2022-04-14 NOTE — Hospital Course (Signed)
57 year old male PMH including diabetes mellitus type 2 presented with 6-day history nausea, vomiting and diarrhea without suspicious history or factors reported.  Admitted for AKI, hyponatremia, nausea, vomiting, diarrhea, elevated liver enzymes.

## 2022-04-14 NOTE — TOC Progression Note (Signed)
Transition of Care [facility]) - Progression Note    Patient Details  Name: Carl [name] MRN: [id number] Date of Birth: 07-Feb-1966  Transition of Care [facility]) CM/SW [place], RN Phone Number:[phone]  04/14/2022, 3:48 PM  Clinical Narrative:     Transition of Care [facility]) Screening Note   Patient Details  Name: Carl [name] Date of Birth: 08-29-1965   Transition of Care [facility]) CM/SW Contact:    Angelita Ingles, RN Phone Number: 04/14/2022, 3:48 PM    Transition of Care Department [facility]) has reviewed patient and no TOC needs have been identified at this time. We will continue to monitor patient advancement through interdisciplinary progression rounds. If new patient transition needs arise, please place a TOC consult.          Expected Discharge Plan and Services                                               Social Determinants of Health (SDOH) Interventions SDOH Screenings   Depression (PHQ2-9): Low Risk  (10/03/2021)  Tobacco Use: Medium Risk (04/13/2022)    Readmission Risk Interventions     No data to display

## 2022-04-14 NOTE — ED Notes (Signed)
Ultrasound at bedside

## 2022-04-14 NOTE — ED Notes (Signed)
ED TO INPATIENT HANDOFF REPORT  ED Nurse Name and Phone #:   S Name/Age/Gender Carl [name] 57 y.o. male Room/Bed: WA10/WA10  Code Status   Code Status: Full Code  Home/SNF/Other Home Patient oriented to: self, place, time, and situation Is this baseline? Yes   Triage Complete: Triage complete  Chief Complaint AKI (acute kidney injury) [facility]) [N17.9]  Triage Note Patient arrived with complaints of NV since Saturday, states now feeling dehydrated.    Allergies Allergies  Allergen Reactions   Morphine And Related Other (See Comments)    hallucinations    Level of Care/Admitting Diagnosis ED Disposition     ED Disposition  Admit   Condition  --   Comment  Hospital Area: [facility] [[id number]]  Level of Care: Telemetry [5]  Admit to tele based on following criteria: Other see comments  Comments: Other  May place patient in observation at [facility] or [facility] if equivalent level of care is available:: Yes  Covid Evaluation: Asymptomatic - no recent exposure (last 10 days) testing not required  Diagnosis: AKI (acute kidney injury) [facility]) [[id number]]  Admitting Physician: John Giovanni [[id number]]  Attending Physician: John Giovanni [[id number]]          B Medical/Surgery History Past Medical History:  Diagnosis Date   Diabetes mellitus without complication (HCC)    Hyperlipidemia    Hypertension    Obesity    Past Surgical History:  Procedure Laterality Date   AMPUTATION Left 12/27/2020   Procedure: LEFT TRANSMETATARSAL AMPUTATION;  Surgeon: Louann Sjogren, MD;  Location: [facility] ORS;  Service: Podiatry;  Laterality: Left;   AMPUTATION TOE Left 09/22/2020   HAND SURGERY     INCISION AND DRAINAGE Left 09/22/2020   Procedure: INCISION AND DRAINAGE LEFT FOOT WITH REMOVAL OF ALL NON VIABLE SOFT TISSUE AND BONE INCLUDING THRID TOE AMPUTATION;  Surgeon: Park Liter, DPM;  Location: [facility] OR;  Service: Podiatry;  Laterality: Left;    VASECTOMY     WOUND DEBRIDEMENT Left 09/26/2020   Procedure: DEBRIDEMENT WOUND LEFT FOOT;  Surgeon: Park Liter, DPM;  Location: [facility] OR;  Service: Podiatry;  Laterality: Left;     A IV Location/Drains/Wounds Patient Lines/Drains/Airways Status     Active Line/Drains/Airways     Name Placement date Placement time Site Days   Peripheral IV 04/14/22 20 G 1" Anterior;Right Forearm 04/14/22  0017  Forearm  less than 1   Incision (Closed) 09/22/20 Foot Left 09/22/20  1653  -- 569   Incision (Closed) 09/26/20 Foot Left 09/26/20  0849  -- 565   Incision (Closed) 12/27/20 Foot Left 12/27/20  1754  -- 473   Wound / Incision (Open or Dehisced) 12/25/20 Diabetic ulcer Toe (Comment  which one) Anterior;Left toe red, swollen,small open area present 12/25/20  2000  Toe (Comment  which one)  475   Wound / Incision (Open or Dehisced) 12/25/20 Incision - Open Foot Anterior;Left small non-healing area to left medial foot incision, redness present as well 12/25/20  2000  Foot  475            Intake/Output Last 24 hours No intake or output data in the 24 hours ending 04/14/22 0533  Labs/Imaging Results for orders placed or performed during the hospital encounter of 04/13/22 (from the past 48 hour(s))  Lipase, blood     Status: None   Collection Time: 04/13/22 10:26 PM  Result Value Ref Range   Lipase 43 11 - 51 U/L  Comment: Performed at [facility], [place] [address], [place], [place] [address]  Comprehensive metabolic panel     Status: Abnormal   Collection Time: 04/13/22 10:26 PM  Result Value Ref Range   Sodium 123 (L) 135 - 145 mmol/L   Potassium 3.6 3.5 - 5.1 mmol/L   Chloride 86 (L) 98 - 111 mmol/L   CO2 23 22 - 32 mmol/L   Glucose, Bld 269 (H) 70 - 99 mg/dL    Comment: Glucose reference range applies only to samples taken after fasting for at least 8 hours.   BUN 33 (H) 6 - 20 mg/dL   Creatinine, Ser 2.09 (H) 0.61 - 1.24 mg/dL   Calcium 8.0 (L) 8.9 - 10.3 mg/dL    Total Protein 6.8 6.5 - 8.1 g/dL   Albumin 3.1 (L) 3.5 - 5.0 g/dL   AST 72 (H) 15 - 41 U/L   ALT 46 (H) 0 - 44 U/L   Alkaline Phosphatase 83 38 - 126 U/L   Total Bilirubin 3.4 (H) 0.3 - 1.2 mg/dL   GFR, Estimated 36 (L) >60 mL/min    Comment: (NOTE) Calculated using the CKD-EPI Creatinine Equation (2021)    Anion gap 14 5 - 15    Comment: Performed at [facility], [place] [address] [place], [place] [address]  CBC     Status: Abnormal   Collection Time: 04/13/22 10:26 PM  Result Value Ref Range   WBC 11.5 (H) 4.0 - 10.5 K/uL   RBC 3.86 (L) 4.22 - 5.81 MIL/uL   Hemoglobin 11.2 (L) 13.0 - 17.0 g/dL   HCT 32.2 (L) 39.0 - 52.0 %   MCV 83.4 80.0 - 100.0 fL   MCH 29.0 26.0 - 34.0 pg   MCHC 34.8 30.0 - 36.0 g/dL   RDW 14.7 11.5 - 15.5 %   Platelets 148 (L) 150 - 400 K/uL   nRBC 0.0 0.0 - 0.2 %    Comment: Performed at [facility], [place] [address], [place],  [address]  Resp panel by RT-PCR (RSV, Flu A&B, Covid) Anterior Nasal Swab     Status: None   Collection Time: 04/13/22 10:26 PM   Specimen: Anterior Nasal Swab  Result Value Ref Range   SARS Coronavirus 2 by RT PCR NEGATIVE NEGATIVE    Comment: (NOTE) SARS-CoV-2 target nucleic acids are NOT DETECTED.  The SARS-CoV-2 RNA is generally detectable in upper respiratory specimens during the acute phase of infection. The lowest concentration of SARS-CoV-2 viral copies this assay can detect is 138 copies/mL. A negative result does not preclude SARS-Cov-2 infection and should not be used as the sole basis for treatment or other patient management decisions. A negative result may occur with  improper specimen collection/handling, submission of specimen other than nasopharyngeal swab, presence of viral mutation(s) within the areas targeted by this assay, and inadequate number of viral copies(<138 copies/mL). A negative result must be combined with clinical observations, patient history, and  epidemiological information. The expected result is Negative.  Fact Sheet for Patients:  EntrepreneurPulse.com.au  Fact Sheet for Healthcare Providers:  IncredibleEmployment.be  This test is no t yet approved or cleared by the [place] FDA and  has been authorized for detection and/or diagnosis of SARS-CoV-2 by FDA under an Emergency Use Authorization (EUA). This EUA will remain  in effect (meaning this test can be used) for the duration of the COVID-19 declaration under Section 564(b)(1) of the Act, 21 U.S.C.section 360bbb-3(b)(1), unless the authorization  is terminated  or revoked sooner.       Influenza A by PCR NEGATIVE NEGATIVE   Influenza B by PCR NEGATIVE NEGATIVE    Comment: (NOTE) The Xpert Xpress SARS-CoV-2/FLU/RSV plus assay is intended as an aid in the diagnosis of influenza from Nasopharyngeal swab specimens and should not be used as a sole basis for treatment. Nasal washings and aspirates are unacceptable for Xpert Xpress SARS-CoV-2/FLU/RSV testing.  Fact Sheet for Patients: EntrepreneurPulse.com.au  Fact Sheet for Healthcare Providers: IncredibleEmployment.be  This test is not yet approved or cleared by the [place] FDA and has been authorized for detection and/or diagnosis of SARS-CoV-2 by FDA under an Emergency Use Authorization (EUA). This EUA will remain in effect (meaning this test can be used) for the duration of the COVID-19 declaration under Section 564(b)(1) of the Act, 21 U.S.C. section 360bbb-3(b)(1), unless the authorization is terminated or revoked.     Resp Syncytial Virus by PCR NEGATIVE NEGATIVE    Comment: (NOTE) Fact Sheet for Patients: EntrepreneurPulse.com.au  Fact Sheet for Healthcare Providers: IncredibleEmployment.be  This test is not yet approved or cleared by the [place] FDA and has been authorized for  detection and/or diagnosis of SARS-CoV-2 by FDA under an Emergency Use Authorization (EUA). This EUA will remain in effect (meaning this test can be used) for the duration of the COVID-19 declaration under Section 564(b)(1) of the Act, 21 U.S.C. section 360bbb-3(b)(1), unless the authorization is terminated or revoked.  Performed at [facility], [place] [address], [place], [place] [address]   Urinalysis, Routine w reflex microscopic -Urine, Clean Catch     Status: Abnormal   Collection Time: 04/14/22  4:34 AM  Result Value Ref Range   Color, Urine AMBER (A) YELLOW    Comment: BIOCHEMICALS MAY BE AFFECTED BY COLOR   APPearance CLOUDY (A) CLEAR   Specific Gravity, Urine 1.018 1.005 - 1.030   pH 5.0 5.0 - 8.0   Glucose, UA >=500 (A) NEGATIVE mg/dL   Hgb urine dipstick MODERATE (A) NEGATIVE   Bilirubin Urine SMALL (A) NEGATIVE   Ketones, ur NEGATIVE NEGATIVE mg/dL   Protein, ur 100 (A) NEGATIVE mg/dL   Nitrite NEGATIVE NEGATIVE   Leukocytes,Ua MODERATE (A) NEGATIVE   RBC / HPF 11-20 0 - 5 RBC/hpf   WBC, UA >50 0 - 5 WBC/hpf   Bacteria, UA MANY (A) NONE SEEN   Squamous Epithelial / HPF 0-5 0 - 5 /HPF   WBC Clumps PRESENT    Mucus PRESENT    Hyaline Casts, UA PRESENT    Granular Casts, UA PRESENT     Comment: Performed at [facility], [place] [address] [place], [place] [address]   US Abdomen Limited RUQ (LIVER/GB)  Result Date: 04/14/2022 CLINICAL DATA:  Nausea and vomiting for several days EXAM: ULTRASOUND ABDOMEN LIMITED RIGHT UPPER QUADRANT COMPARISON:  None Available. FINDINGS: Gallbladder: No gallstones or wall thickening visualized. No sonographic Murphy sign noted by sonographer. Common bile duct: Diameter: 3 mm Liver: Increased in echogenicity consistent with fatty infiltration. No focal mass is noted. Portal vein is patent on color Doppler imaging with normal direction of blood flow towards the liver. Other: None. IMPRESSION: Fatty liver  without acute abnormality. Electronically Signed   By: Inez Catalina M.D.   On: 04/14/2022 00:52    Pending Labs Unresulted Labs (From admission, onward)     Start     Ordered   04/14/22 0527  Urine Culture (for pregnant, neutropenic or urologic patients  or patients with an indwelling urinary catheter)  (Urine Labs)  Add-on,   AD       Question:  Indication  Answer:  Dysuria   04/14/22 0526   04/14/22 0516  Hemoglobin A1c  Once,   R       Comments: To assess prior glycemic control    04/14/22 0515   04/14/22 0515  Gastrointestinal Panel by PCR , Stool  (Gastrointestinal Panel by PCR, Stool                                                                                                                                                     **Does Not include CLOSTRIDIUM DIFFICILE testing. **If CDIFF testing is needed, place order from the "C Difficile Testing" order set.**)  Once,   R        04/14/22 0515   04/14/22 0515  Osmolality  Once,   R        04/14/22 0515   04/14/22 0515  Sodium, urine, random  Once,   R        04/14/22 0515   04/14/22 0515  Creatinine, urine, random  Once,   R        04/14/22 0515   04/14/22 0514  CBC  Once,   R        04/14/22 0515   04/14/22 0514  Basic metabolic panel  Now then every 4 hours,   R (with TIMED occurrences)      04/14/22 0515   04/14/22 0514  Hepatic function panel  Once,   R        04/14/22 0515   04/14/22 0514  C Difficile Quick Screen w PCR reflex  (C Difficile quick screen w PCR reflex panel )  Once, for 24 hours,   TIMED       References:    CDiff Information Tool   04/14/22 0515   04/14/22 0512  HIV Antibody (routine testing w rflx)  (HIV Antibody (Routine testing w reflex) panel)  Once,   R        04/14/22 0515            Vitals/Pain Today's Vitals   04/14/22 0330 04/14/22 0400 04/14/22 0436 04/14/22 0437  BP: 124/65 127/64 (!) 148/70   Pulse: 89 82 88   Resp: 11  15   Temp:    98 F (36.7 C)  TempSrc:    Oral  SpO2: 92% 93%  95%   PainSc:    0-No pain    Isolation Precautions Enteric precautions (UV disinfection)  Medications Medications  sodium chloride 0.9 % bolus 1,000 mL (0 mLs Intravenous Stopped 04/14/22 0243)    Followed by  0.9 %  sodium chloride infusion (1,000 mLs Intravenous New Bag/Given 04/14/22 0019)  cefTRIAXone (ROCEPHIN) 1 g in sodium chloride 0.9 %  100 mL IVPB (1 g Intravenous New Bag/Given 04/14/22 0530)  cefTRIAXone (ROCEPHIN) 1 g in sodium chloride 0.9 % 100 mL IVPB (has no administration in time range)  amLODipine (NORVASC) tablet 10 mg (has no administration in time range)  atorvastatin (LIPITOR) tablet 20 mg (has no administration in time range)  enoxaparin (LOVENOX) injection 40 mg (has no administration in time range)  prochlorperazine (COMPAZINE) injection 5 mg (has no administration in time range)  insulin aspart (novoLOG) injection 0-9 Units (has no administration in time range)  insulin aspart (novoLOG) injection 0-5 Units (has no administration in time range)    Mobility walks     Focused Assessments    R Recommendations: See Admitting Provider Note  Report given to:   Additional Notes:

## 2022-04-14 NOTE — Progress Notes (Signed)
Bilateral lower extremity venous duplex has been completed. Preliminary results can be found in CV Proc through chart review.   04/14/22 11:06 AM Carlos Levering RVT

## 2022-04-14 NOTE — ED Notes (Signed)
Pt. Oxygen level decreased to 88% on room air. Pt. Placed on 2 liters oxygen by Paramedic, Carl [name].

## 2022-04-14 NOTE — Progress Notes (Addendum)
Hypoglycemic Event  CBG: 55  Treatment: 8 oz juice/soda  Symptoms: None  Follow-up CBG: Time:15 mins   CBG Result: 66  Possible Reasons for Event: Inadequate meal intake  Comments/MD notified: On call provider has been notified    Hypoglycemic Event  CBG: 66  Treatment: 8 oz juice/soda  Symptoms: None  Follow-up CBG: Time:15 mins   CBG Result:75  Possible Reasons for Event: Inadequate meal intake  Comments/MD notified:On call provider has been notified   RN will recheck in another 30 mins

## 2022-04-15 ENCOUNTER — Inpatient Hospital Stay (HOSPITAL_COMMUNITY): Payer: Self-pay

## 2022-04-15 DIAGNOSIS — J9601 Acute respiratory failure with hypoxia: Secondary | ICD-10-CM

## 2022-04-15 LAB — ECHOCARDIOGRAM COMPLETE
AR max vel: 2.79 cm2
AV Area VTI: 3.38 cm2
AV Area mean vel: 2.7 cm2
AV Mean grad: 4 mmHg
AV Peak grad: 7.2 mmHg
Ao pk vel: 1.34 m/s
Area-P 1/2: 5.02 cm2
Calc EF: 54.5 %
Height: 70 in
S' Lateral: 3.6 cm
Single Plane A2C EF: 53.4 %
Single Plane A4C EF: 56.1 %
Weight: 4464 oz

## 2022-04-15 LAB — CBC
HCT: 29.4 % — ABNORMAL LOW (ref 39.0–52.0)
Hemoglobin: 9.7 g/dL — ABNORMAL LOW (ref 13.0–17.0)
MCH: 28 pg (ref 26.0–34.0)
MCHC: 33 g/dL (ref 30.0–36.0)
MCV: 84.7 fL (ref 80.0–100.0)
Platelets: 125 10*3/uL — ABNORMAL LOW (ref 150–400)
RBC: 3.47 MIL/uL — ABNORMAL LOW (ref 4.22–5.81)
RDW: 15.3 % (ref 11.5–15.5)
WBC: 7.2 10*3/uL (ref 4.0–10.5)
nRBC: 0 % (ref 0.0–0.2)

## 2022-04-15 LAB — COMPREHENSIVE METABOLIC PANEL
ALT: 50 U/L — ABNORMAL HIGH (ref 0–44)
AST: 86 U/L — ABNORMAL HIGH (ref 15–41)
Albumin: 2.6 g/dL — ABNORMAL LOW (ref 3.5–5.0)
Alkaline Phosphatase: 77 U/L (ref 38–126)
Anion gap: 10 (ref 5–15)
BUN: 36 mg/dL — ABNORMAL HIGH (ref 6–20)
CO2: 24 mmol/L (ref 22–32)
Calcium: 7.7 mg/dL — ABNORMAL LOW (ref 8.9–10.3)
Chloride: 93 mmol/L — ABNORMAL LOW (ref 98–111)
Creatinine, Ser: 1.68 mg/dL — ABNORMAL HIGH (ref 0.61–1.24)
GFR, Estimated: 47 mL/min — ABNORMAL LOW (ref >60)
Glucose, Bld: 78 mg/dL (ref 70–99)
Potassium: 3.8 mmol/L (ref 3.5–5.1)
Sodium: 127 mmol/L — ABNORMAL LOW (ref 135–145)
Total Bilirubin: 2.3 mg/dL — ABNORMAL HIGH (ref 0.3–1.2)
Total Protein: 5.7 g/dL — ABNORMAL LOW (ref 6.5–8.1)

## 2022-04-15 LAB — GLUCOSE, CAPILLARY
Glucose-Capillary: 93 mg/dL (ref 70–99)
Glucose-Capillary: 63 mg/dL — ABNORMAL LOW (ref 70–99)
Glucose-Capillary: 92 mg/dL (ref 70–99)
Glucose-Capillary: 86 mg/dL (ref 70–99)
Glucose-Capillary: 129 mg/dL — ABNORMAL HIGH (ref 70–99)
Glucose-Capillary: 152 mg/dL — ABNORMAL HIGH (ref 70–99)

## 2022-04-15 MED ORDER — VANCOMYCIN HCL 500 MG/100ML IV SOLN
500.0000 mg | Freq: Once | INTRAVENOUS | Status: AC
  Administered 2022-04-15: 500 mg via INTRAVENOUS
  Filled 2022-04-15: qty 100

## 2022-04-15 MED ORDER — PERFLUTREN LIPID MICROSPHERE
1.0000 mL | INTRAVENOUS | Status: AC | PRN
  Administered 2022-04-15: 2 mL via INTRAVENOUS

## 2022-04-15 MED ORDER — SODIUM CHLORIDE 1 G PO TABS
1.0000 g | ORAL_TABLET | Freq: Two times a day (BID) | ORAL | Status: DC
  Administered 2022-04-15 – 2022-04-16 (×2): 1 g via ORAL
  Filled 2022-04-15 (×2): qty 1

## 2022-04-15 MED ORDER — LORAZEPAM 0.5 MG PO TABS
0.5000 mg | ORAL_TABLET | Freq: Four times a day (QID) | ORAL | Status: DC | PRN
  Administered 2022-04-15 – 2022-04-18 (×5): 0.5 mg via ORAL
  Filled 2022-04-15 (×5): qty 1

## 2022-04-15 MED ORDER — ACETAMINOPHEN 325 MG PO TABS
650.0000 mg | ORAL_TABLET | Freq: Four times a day (QID) | ORAL | Status: DC | PRN
  Administered 2022-04-15: 650 mg via ORAL
  Filled 2022-04-15: qty 2

## 2022-04-15 MED ORDER — LIDOCAINE 5 % EX PTCH
1.0000 | MEDICATED_PATCH | CUTANEOUS | Status: DC
  Administered 2022-04-15 – 2022-04-22 (×8): 1 via TRANSDERMAL
  Filled 2022-04-15 (×8): qty 1

## 2022-04-15 MED ORDER — VANCOMYCIN HCL 1500 MG/300ML IV SOLN: 1500.0000 mg | INTRAVENOUS | Status: DC

## 2022-04-15 MED ORDER — VANCOMYCIN HCL 2000 MG/400ML IV SOLN
2000.0000 mg | Freq: Once | INTRAVENOUS | Status: AC
  Administered 2022-04-15: 2000 mg via INTRAVENOUS
  Filled 2022-04-15: qty 400

## 2022-04-15 NOTE — Progress Notes (Addendum)
Pharmacy Antibiotic Note  Carl [name] is a 57 y.o. male admitted on 04/13/2022. Patient with fever 103. Urine culture with staph aureus pending sensitivities. Blood cultures ordered. Pharmacy has been consulted for vancomycin dosing.  AKI on admission - improving. SCr down to 1.68 today.  Plan: -Vancomycin 2.5 g IV x 1 followed by 1500 mg IV q24h -Continue to follow renal function, cultures and clinical progress for dose adjustments and de-escalation as indicated  Height: 5\' 10"  (177.8 cm) Weight: 126.6 kg (279 lb) IBW/kg (Calculated) : 73  Temp (24hrs), Avg:100.4 F (38 C), Min:97.9 F (36.6 C), Max:103.1 F (39.5 C)  Recent Labs  Lab 04/13/22 2226 04/14/22 0629 04/14/22 0853 04/14/22 1411 04/14/22 2031 04/15/22 0606  WBC 11.5* 10.3  --   --   --  7.2  CREATININE 2.09* 2.09* 1.93* 1.93* 1.85* 1.68*    Estimated Creatinine Clearance: 64 mL/min (A) (by C-G formula based on SCr of 1.68 mg/dL (H)).    Allergies  Allergen Reactions   Morphine And Related Other (See Comments)    hallucinations    Antimicrobials this admission: Vancomycin 2/3 >>  Dose adjustments this admission: NA  Microbiology results: 2/3 BCx: ordered 2/2 UCx: >100k staph aureus    Thank you for allowing pharmacy to be a part of this patient's care.  Tawnya Crook, PharmD, BCPS Clinical Pharmacist 04/15/2022 3:42 PM

## 2022-04-15 NOTE — Progress Notes (Signed)
VS post fall triggered Red MEWS protocol. MD, Rapid Response RN and Charge RN made aware. See new orders. Will continue to monitor.    04/15/22 1428  Assess: MEWS Score  Temp (!) 103 F (39.4 C)  BP (!) 182/84  MAP (mmHg) 111  Pulse Rate (!) 114  Resp (!) 36  Level of Consciousness Alert  SpO2 90 %  O2 Device Nasal Cannula  Patient Activity (if Appropriate) Other (Comment) (post fall)  O2 Flow Rate (L/min) 2 L/min  Assess: MEWS Score  MEWS Temp 2  MEWS Systolic 0  MEWS Pulse 2  MEWS RR 3  MEWS LOC 0  MEWS Score 7  MEWS Score Color Red  Assess: if the MEWS score is Yellow or Red  Were vital signs taken at a resting state? No  Focused Assessment No change from prior assessment  Does the patient meet 2 or more of the SIRS criteria? No  MEWS guidelines implemented *See Row Information* Yes  Treat  MEWS Interventions Administered scheduled meds/treatments;Administered prn meds/treatments;Escalated (See documentation below)  Pain Scale 0-10  Pain Score 0  Take Vital Signs  Increase Vital Sign Frequency  Red: Q 1hr X 4 then Q 4hr X 4, if remains red, continue Q 4hrs  Escalate  MEWS: Escalate Red: discuss with charge nurse/RN and provider, consider discussing with RRT  Notify: Charge Nurse/RN  Name of Charge Nurse/RN Notified Cindy RN  Date Charge Nurse/RN Notified 04/15/22  Time Charge Nurse/RN Notified 1430  Provider Notification  Provider Name/Title Sarajane Jews  Date Provider Notified 04/15/22  Time Provider Notified 1430  Method of Notification Page  Notification Reason Other (Comment) (Fall)  Provider response At bedside  Date of Provider Response 04/15/22  Time of Provider Response 1445  Notify: Rapid Response  Name of Rapid Response RN Notified RRT RN  Date Rapid Response Notified 04/15/22  Time Rapid Response Notified 4097  Document  Patient Outcome Stabilized after interventions  Progress note created (see row info) Yes  Assess: SIRS CRITERIA  SIRS Temperature   1  SIRS Pulse 1  SIRS Respirations  1  SIRS WBC 0  SIRS Score Sum  3   MEWS Guidelines - (patients age 75 and over)

## 2022-04-15 NOTE — Progress Notes (Signed)
Mobility Specialist - Progress Note  Pre-mobility: 110 bpm HR, 95% SpO2 During mobility: 118 bpm, HR, 89% SpO2 Post-mobility: 110 bpm HR, 90% SPO2   04/15/22 1049  Oxygen Therapy  O2 Device Nasal Cannula  O2 Flow Rate (L/min) 2 L/min  Patient Activity (if Appropriate) Ambulating  Mobility  Activity Ambulated with assistance in hallway  Level of Assistance Contact guard assist, steadying assist  Assistive Device Front wheel walker  Distance Ambulated (ft) 80 ft  Range of Motion/Exercises Active  Activity Response Tolerated well  Mobility Referral Yes  $Mobility charge 1 Mobility   Pt was found in bed and agreeable to ambulate. When supine in bed SPO2 stayed between 87-89% but once sitting EOB SPO2 increased >90%. Pt was shaky during ambulation and a little unsteady at times. At EOS returned to bed with all necessities in reach.  Ferd Hibbs Mobility Specialist

## 2022-04-15 NOTE — Progress Notes (Signed)
Progress Note   Patient: Carl [name] QMV:[id number] DOB: December 09, 1965 DOA: 04/13/2022     1 DOS: the patient was seen and examined on 04/15/2022   Brief hospital course: 57 year old male PMH including diabetes mellitus type 2 presented with 6-day history nausea, vomiting and diarrhea without suspicious history or factors reported.  Admitted for AKI, hyponatremia, nausea, vomiting, diarrhea, elevated liver enzymes.  Assessment and Plan: AKI FENa 0.1%, likely prerenal from dehydration in the setting of vomiting and diarrhea. BUN 33, creatinine 2.0 (baseline 0.4-0.8).  Urinalysis with proteinuria, hematuria, granular and hyaline casts. Hold home Cozaar and metformin.  Not on any diuretics. Slowly improving with IV fluids.  Decrease IV fluid rate.   Hyponatremia, likely hypoosmolar In the setting of diarrhea/vomiting/poor p.o. intake.  Sodium 123, previously 134 in June 2023. Stalled correction at 127.  Try salt tablet.   Nausea, vomiting and diarrhea  Resolved.  Abnormal urinalysis, microscopic hematuria, pyuria, proteinuria with moderate leukocytes and microscopy showing 11-20 RBCs,>50 WBC  Fever COVID and flu negative.  No abdominal or flank pain.  Abdominal ultrasound with no acute findings. Fever today.  Chest x-ray no acute disease.  Urine culture growing Streptococcus aureus.  Start vancomycin.  Check blood cultures.   Elevated liver enzymes.  May be secondary to fatty liver. On admission: T. bili 3.4, AST 72, ALT 46, alk phos normal.  No history of heavy alcohol use.  Right upper quadrant ultrasound showing fatty liver without acute abnormality. Etiology unclear.  Total bilirubin trending down.  Transaminases stable. Stopped statin.  Hepatitis panel negative.  Trend CMP.     Diarrhea No abdominal pain.  No fever or significant leukocytosis.  COVID and flu negative. C. difficile negative.  GI pathogen panel negative. Continue enteric precautions   Hypoxia without respiratory  distress.   Acute hypoxemic respiratory failure considered on admission but no signs or symptoms or distress, therefore ruled out. Chest x-ray no acute disease.  Oxygen saturation 88% on room air, currently satting well on 2 L [place].  Lungs clear on exam.  Echo unrevealing.  Normal LVEF.  Grade 1 diastolic dysfunction.  No lower extremity edema.  D-dimer was positive but lower extremity Dopplers and VQ scan were negative. May be from obesity.  Monitor.   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 Hemoglobin A1c 7.8 Hold glipizide and metformin Sliding scale insulin.   Normocytic anemia Hemoglobin 10.8, stable compared to values from last year.  Appears to be at baseline.   Thrombocytopenia Modest.  Platelets 125.  Trend CBC.   Essential hypertension Soft.  Hold antihypertensives.   Hyperlipidemia Stopped statin given elevated transaminases.      Subjective:  Feels okay today, tolerating diet.  Got up out of bed by himself lost his balance and fell on the floor.  Denies any injuries.  Did not hit his head or pass out.  Per nursing he has a abrasion on his right knee.  Rapid response called about an hour later for tachycardia and tachypnea in the context of fever.  Physical Exam: Vitals:   04/14/22 1838 04/14/22 2011 04/15/22 0544 04/15/22 0623  BP: (!) 146/77 132/71 (!) 157/82   Pulse: 96 92 91   Resp: (!) 21 20 18    Temp: 98.8 F (37.1 C) 97.9 F (36.6 C) 99.2 F (37.3 C)   TempSrc:  Oral Oral   SpO2: 95% 95% 95%   Weight:    126.6 kg  Height:  5\' 10"  (1.778 m)   Physical Exam Vitals reviewed.  Constitutional:      General: He is not in acute distress.    Appearance: He is not ill-appearing or toxic-appearing.  Cardiovascular:     Rate and Rhythm: Regular rhythm. Tachycardia present.     Heart sounds: No murmur heard. Pulmonary:     Effort: Pulmonary effort is normal. No respiratory distress.      Breath sounds: No wheezing, rhonchi or rales.     Comments: Tachypnea without dyspnea Abdominal:     Palpations: Abdomen is soft.  Skin:    Comments: Small abrasion right knee  Neurological:     Mental Status: He is alert.  Psychiatric:        Mood and Affect: Mood normal.        Behavior: Behavior normal.   No apparent musculoskeletal injury.  Data Reviewed: CBG one low Na+ 127 Creatinine down to 1.68 AST at 86 ALT at 50 Tbili at 2.3 Hgb stable 9.7 Plts 125  Family Communication: none  Disposition: Status is: Inpatient Remains inpatient appropriate because: fever, AKI  Planned Discharge Destination: Home    Time spent: 35 minutes  Author: Murray Hodgkins, MD 04/15/2022 8:24 AM  For on call review [email or web address].

## 2022-04-15 NOTE — Plan of Care (Signed)

## 2022-04-15 NOTE — Progress Notes (Signed)
Hypoglycemic Event  CBG: 63  Treatment: 4 oz juice/soda  Symptoms: None  Follow-up CBG: [id number] CBG Result:92  Possible Reasons for Event: Inadequate meal intake  Comments/MD notified:[place]    Rivers Hamrick Alondra  [employer]

## 2022-04-15 NOTE — Progress Notes (Addendum)
Patient found on floor, face down after PT session. Doctor Sarajane Jews paged and new orders placed for chest x-ray, blood cultures d/t fever 103.1, and vanc ordered. Patient sustain right knee abrasion during fall, cleansed with normal saline, gauze, and 2x2 mepilex in place, Patient remains safely in bed, bed alarm on, bed in lowest position and call bell within reach.

## 2022-04-15 NOTE — Progress Notes (Signed)
Rapid Response Event Note   Reason for Call :   Change in patient status   Initial Focused Assessment:  A&O x4, tachypnea 40, tachycardic 110, febrile 103.1    Interventions:  Acetaminophen given, MD at bedside, ordered anti-anxiety medication  Plan of Care:  Continue to monitor   MD Notified: Sarajane Jews at bedside shortly after RRT arrival to patient room Arrival Time: 1515 End Time: [place], RN

## 2022-04-15 NOTE — Progress Notes (Signed)
  Echocardiogram 2D Echocardiogram has been performed.  Carl [name] 04/15/2022, 9:28 AM

## 2022-04-16 DIAGNOSIS — R7881 Bacteremia: Secondary | ICD-10-CM

## 2022-04-16 DIAGNOSIS — B9561 Methicillin susceptible Staphylococcus aureus infection as the cause of diseases classified elsewhere: Secondary | ICD-10-CM

## 2022-04-16 LAB — COMPREHENSIVE METABOLIC PANEL
ALT: 56 U/L — ABNORMAL HIGH (ref 0–44)
AST: 69 U/L — ABNORMAL HIGH (ref 15–41)
Albumin: 2.4 g/dL — ABNORMAL LOW (ref 3.5–5.0)
Alkaline Phosphatase: 94 U/L (ref 38–126)
Anion gap: 12 (ref 5–15)
BUN: 29 mg/dL — ABNORMAL HIGH (ref 6–20)
CO2: 21 mmol/L — ABNORMAL LOW (ref 22–32)
Calcium: 7.7 mg/dL — ABNORMAL LOW (ref 8.9–10.3)
Chloride: 97 mmol/L — ABNORMAL LOW (ref 98–111)
Creatinine, Ser: 1.13 mg/dL (ref 0.61–1.24)
GFR, Estimated: >60 mL/min (ref >60)
Glucose, Bld: 165 mg/dL — ABNORMAL HIGH (ref 70–99)
Potassium: 3.6 mmol/L (ref 3.5–5.1)
Sodium: 130 mmol/L — ABNORMAL LOW (ref 135–145)
Total Bilirubin: 2.4 mg/dL — ABNORMAL HIGH (ref 0.3–1.2)
Total Protein: 5.4 g/dL — ABNORMAL LOW (ref 6.5–8.1)

## 2022-04-16 LAB — BLOOD CULTURE ID PANEL (REFLEXED) - BCID2

## 2022-04-16 LAB — URINE CULTURE: Culture: >=100000 — AB

## 2022-04-16 LAB — CBC
HCT: 31.1 % — ABNORMAL LOW (ref 39.0–52.0)
Hemoglobin: 10 g/dL — ABNORMAL LOW (ref 13.0–17.0)
MCH: 27.9 pg (ref 26.0–34.0)
MCHC: 32.2 g/dL (ref 30.0–36.0)
MCV: 86.9 fL (ref 80.0–100.0)
Platelets: 99 10*3/uL — ABNORMAL LOW (ref 150–400)
RBC: 3.58 MIL/uL — ABNORMAL LOW (ref 4.22–5.81)
RDW: 15.6 % — ABNORMAL HIGH (ref 11.5–15.5)
WBC: 8 10*3/uL (ref 4.0–10.5)
nRBC: 0 % (ref 0.0–0.2)

## 2022-04-16 LAB — GLUCOSE, CAPILLARY
Glucose-Capillary: 170 mg/dL — ABNORMAL HIGH (ref 70–99)
Glucose-Capillary: 214 mg/dL — ABNORMAL HIGH (ref 70–99)
Glucose-Capillary: 236 mg/dL — ABNORMAL HIGH (ref 70–99)
Glucose-Capillary: 227 mg/dL — ABNORMAL HIGH (ref 70–99)

## 2022-04-16 MED ORDER — ALBUTEROL SULFATE (2.5 MG/3ML) 0.083% IN NEBU
2.5000 mg | INHALATION_SOLUTION | RESPIRATORY_TRACT | Status: DC | PRN
  Administered 2022-04-16 (×2): 2.5 mg via RESPIRATORY_TRACT
  Filled 2022-04-16 (×2): qty 3

## 2022-04-16 MED ORDER — VANCOMYCIN HCL IN DEXTROSE 1-5 GM/200ML-% IV SOLN: 1000.0000 mg | Freq: Two times a day (BID) | INTRAVENOUS | Status: DC

## 2022-04-16 MED ORDER — FUROSEMIDE 10 MG/ML IJ SOLN
20.0000 mg | Freq: Once | INTRAMUSCULAR | Status: AC
  Administered 2022-04-16: 20 mg via INTRAVENOUS
  Filled 2022-04-16: qty 2

## 2022-04-16 MED ORDER — CAMPHOR-MENTHOL 0.5-0.5 % EX LOTN
TOPICAL_LOTION | CUTANEOUS | Status: DC | PRN
  Administered 2022-04-16: 1 via TOPICAL
  Filled 2022-04-16: qty 222

## 2022-04-16 MED ORDER — CEFAZOLIN SODIUM-DEXTROSE 2-4 GM/100ML-% IV SOLN
2.0000 g | Freq: Three times a day (TID) | INTRAVENOUS | Status: DC
  Administered 2022-04-16 – 2022-04-22 (×18): 2 g via INTRAVENOUS
  Filled 2022-04-16 (×17): qty 100

## 2022-04-16 NOTE — Progress Notes (Signed)
PHARMACY - PHYSICIAN COMMUNICATION CRITICAL VALUE ALERT - BLOOD CULTURE IDENTIFICATION (BCID)  Carl [name] is an 57 y.o. male who presented to [facility] on 04/13/2022 with a chief complaint of N/V/D.  Assessment:  2 of 4 bottles +MSSA, urine cultures also with MSSA  Name of physician (or Provider) Contacted: Dr. Sarajane Jews  Current antibiotics: vancomycin  Changes to prescribed antibiotics recommended:  Recommendations accepted by provider - change to cefazolin 2g IV q8h  Results for orders placed or performed during the hospital encounter of 04/13/22  Blood Culture ID Panel (Reflexed) (Collected: 04/15/2022  4:21 PM)  Result Value Ref Range   Enterococcus faecalis NOT DETECTED NOT DETECTED   Enterococcus Faecium NOT DETECTED NOT DETECTED   Listeria monocytogenes NOT DETECTED NOT DETECTED   Staphylococcus species DETECTED (A) NOT DETECTED   Staphylococcus aureus (BCID) DETECTED (A) NOT DETECTED   Staphylococcus epidermidis NOT DETECTED NOT DETECTED   Staphylococcus lugdunensis NOT DETECTED NOT DETECTED   Streptococcus species NOT DETECTED NOT DETECTED   Streptococcus agalactiae NOT DETECTED NOT DETECTED   Streptococcus pneumoniae NOT DETECTED NOT DETECTED   Streptococcus pyogenes NOT DETECTED NOT DETECTED   A.calcoaceticus-baumannii NOT DETECTED NOT DETECTED   Bacteroides fragilis NOT DETECTED NOT DETECTED   Enterobacterales NOT DETECTED NOT DETECTED   Enterobacter cloacae complex NOT DETECTED NOT DETECTED   Escherichia coli NOT DETECTED NOT DETECTED   Klebsiella aerogenes NOT DETECTED NOT DETECTED   Klebsiella oxytoca NOT DETECTED NOT DETECTED   Klebsiella pneumoniae NOT DETECTED NOT DETECTED   Proteus species NOT DETECTED NOT DETECTED   Salmonella species NOT DETECTED NOT DETECTED   Serratia marcescens NOT DETECTED NOT DETECTED   Haemophilus influenzae NOT DETECTED NOT DETECTED   Neisseria meningitidis NOT DETECTED NOT DETECTED   Pseudomonas aeruginosa NOT DETECTED NOT DETECTED    Stenotrophomonas maltophilia NOT DETECTED NOT DETECTED   Candida albicans NOT DETECTED NOT DETECTED   Candida auris NOT DETECTED NOT DETECTED   Candida glabrata NOT DETECTED NOT DETECTED   Candida krusei NOT DETECTED NOT DETECTED   Candida parapsilosis NOT DETECTED NOT DETECTED   Candida tropicalis NOT DETECTED NOT DETECTED   Cryptococcus neoformans/gattii NOT DETECTED NOT DETECTED   Meth resistant mecA/C and MREJ NOT DETECTED NOT DETECTED    Peggyann Juba, PharmD, BCPS Pharmacy: [phone] 04/16/2022  1:15 PM

## 2022-04-16 NOTE — Progress Notes (Signed)
Progress Note   Patient: Carl [name]:[id number] DOB: Jan 28, 1966 DOA: 04/13/2022     2 DOS: the patient was seen and examined on 04/16/2022   Brief hospital course: 57 year old male PMH including diabetes mellitus type 2 presented with 6-day history nausea, vomiting and diarrhea without suspicious history or factors reported.  Admitted for AKI, hyponatremia, nausea, vomiting, diarrhea, elevated liver enzymes.  Nausea and vomiting quickly resolved.  Renal function stabilized with fluids.  Now with MSSA bacteremia and UTI.  Assessment and Plan: AKI FENa 0.1%, prerenal from dehydration in the setting of vomiting and diarrhea. BUN 33, creatinine 2.0 (baseline 0.4-0.8).  Hold home Cozaar and metformin.  Not on any diuretics. Resolved with IV fluids.   Hyponatremia, likely hypoosmolar In the setting of diarrhea/vomiting/poor p.o. intake.  Sodium 123, previously 134 in June 2023. Much improved.  Follow clinically.   Nausea, vomiting and diarrhea  Resolved.   MSSA UTI.   Staphylococcus bacteremia probable MSSA Fever COVID and flu negative.  No abdominal or flank pain. Abdominal ultrasound with no acute findings. Chest x-ray no acute disease.  Narrow antibiotics to cefazolin.  ID consultation.   Elevated liver enzymes.  May be secondary to fatty liver. On admission: T. bili 3.4, AST 72, ALT 46, alk phos normal.  No history of heavy alcohol use.  Right upper quadrant ultrasound showing fatty liver without acute abnormality. Etiology unclear.   Stopped statin.  Hepatitis panel negative.  T  AST and ALT stable.  Total bilirubin without significant change.   Diarrhea Appears to have resolved. No abdominal pain.  No fever or significant leukocytosis.  COVID and flu negative. C. difficile negative.  GI pathogen panel negative.    Hypoxia without respiratory distress.   Acute hypoxemic respiratory failure considered on admission but no signs or symptoms or distress, therefore ruled out. Chest  x-ray no acute disease.  Oxygen saturation 88% on room air, currently satting well on 2 L [place].  Lungs clear on exam.  Echo unrevealing.  Normal LVEF.  Grade 1 diastolic dysfunction.  No lower extremity edema.  D-dimer was positive but lower extremity Dopplers and VQ scan were negative. May be from obesity.  Monitor.   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 Hemoglobin A1c 7.8 Hold glipizide and metformin Continue sliding scale insulin.   Normocytic anemia Hemoglobin appears to be stable, at baseline.   Thrombocytopenia Down to 99 today.  Likely related to acute infection.  Trend CBC.   Essential hypertension Soft.  Hold antihypertensives.   Hyperlipidemia Stopped statin given elevated transaminases.     Subjective:  Slept fine Breathing ok Tachypneic at times No pain  Physical Exam: Vitals:   04/16/22 0401 04/16/22 0825 04/16/22 0948 04/16/22 1425  BP: (!) 152/81  (!) 161/72 (!) 161/76  Pulse: 85  96 100  Resp: 18  (!) 24 20  Temp: 98.1 F (36.7 C)   99.8 F (37.7 C)  TempSrc: Oral   Oral  SpO2: 95% 95% 93% 93%  Weight:      Height:       Physical Exam Vitals reviewed.  Constitutional:      General: He is not in acute distress.    Appearance: He is not ill-appearing or toxic-appearing.  Cardiovascular:     Rate and Rhythm: Normal rate and regular rhythm.     Heart sounds: No murmur heard. Pulmonary:     Effort: No respiratory distress.  Breath sounds: Rales (posterior) present. No wheezing.     Comments: Tachypneic, mild increased respiratory effort Musculoskeletal:     Right lower leg: Edema present.     Left lower leg: Edema present.  Neurological:     Mental Status: He is alert.  Psychiatric:        Mood and Affect: Mood normal.        Behavior: Behavior normal.    Tm 103.1  Data Reviewed: CBG stable last 24 hours, one low 0733 yesterday Hgb stable 10.0 Plts down to  99 Sodium 130, creatinine down to 1.13, AST down to 69, ALT down to 56, total bilirubin stable at 2.4  Family Communication: .None  Disposition: Status is: Inpatient Remains inpatient appropriate because: hypoxic, tachypneic.   Planned Discharge Destination: Home with Home Health    Time spent: 35 minutes  Author: Murray Hodgkins, MD 04/16/2022 2:54 PM  For on call review [email or web address].

## 2022-04-16 NOTE — Progress Notes (Signed)
Pharmacy Antibiotic Note  Carl [name] is a 57 y.o. male admitted on 04/13/2022. Patient with fever 103. Urine culture with staph aureus pending sensitivities. Blood cultures ordered. Pharmacy has been consulted for vancomycin dosing.  AKI on admission - improving. SCr down to 1.13 today.  Plan: -Change vancomycin to 1000 mg IV every 12 hours (eAUC 482.6 using SCr 1.13 and Vd 0.5) -Continue to follow renal function, cultures and clinical progress for dose adjustments and de-escalation as indicated  Height: 5\' 10"  (177.8 cm) Weight: 126.6 kg (279 lb) IBW/kg (Calculated) : 73  Temp (24hrs), Avg:101 F (38.3 C), Min:98.1 F (36.7 C), Max:103.1 F (39.5 C)  Recent Labs  Lab 04/13/22 2226 04/14/22 0629 04/14/22 0853 04/14/22 1411 04/14/22 2031 04/15/22 0606 04/16/22 0626  WBC 11.5* 10.3  --   --   --  7.2 8.0  CREATININE 2.09* 2.09* 1.93* 1.93* 1.85* 1.68* 1.13     Estimated Creatinine Clearance: 95.1 mL/min (by C-G formula based on SCr of 1.13 mg/dL).    Allergies  Allergen Reactions   Morphine And Related Other (See Comments)    hallucinations    Antimicrobials this admission: Vancomycin 2/3 >>  Dose adjustments this admission:  2/4 change vancomycin from 1500mg  IV q24h to 1000mg  IV q12h  Microbiology results: 2/3 BCx: pending 2/2 UCx: >100k MSSA    Thank you for allowing pharmacy to be a part of this patient's care.  Suzzanne Cloud, PharmD, BCPS Clinical Pharmacist 04/16/2022 11:48 AM

## 2022-04-16 NOTE — Plan of Care (Signed)

## 2022-04-16 NOTE — Progress Notes (Signed)
    [facility] for Infectious Disease   Date of Admission:  04/13/2022     Reason for visit: Positive blood cx for MSSA, [employer]  HPI: Full consult to follow.  Patient admitted 04/13/22 with sepsis of unclear etiology found to have MSSA isolated in urine cultures from admission but no clear urinary symptoms.  He was febrile yesterday to 103.1 and blood cx were done as a result.  These are also positive for MSSA in 3 of 3 bottles.  He has been narrowed to cefazolin after blood cultures resulted.  TTE was also done yesterday as part of his admission work up and this was a difficult study but no vegetations are noted.   Physical Exam: Blood pressure (!) 161/76, pulse 100, temperature 99.8 F (37.7 C), temperature source Oral, resp. rate 20, height 5\' 10"  (1.778 m), weight 126.6 kg, SpO2 93 %.   Assessment:  MSSA bacteremia of unclear source.  Recommendations: -- Continue Cefazolin -- Repeat blood cx tomorrow morning -- Recommend TEE -- Full consult to follow    Rayn[facility] for [facility]ious [place] [phone] pager 04/16/2022, 4:48 PM

## 2022-04-17 ENCOUNTER — Other Ambulatory Visit: Payer: Self-pay

## 2022-04-17 DIAGNOSIS — E871 Hypo-osmolality and hyponatremia: Secondary | ICD-10-CM

## 2022-04-17 DIAGNOSIS — N390 Urinary tract infection, site not specified: Secondary | ICD-10-CM

## 2022-04-17 LAB — COMPREHENSIVE METABOLIC PANEL
ALT: 48 U/L — ABNORMAL HIGH (ref 0–44)
AST: 53 U/L — ABNORMAL HIGH (ref 15–41)
Albumin: 2.6 g/dL — ABNORMAL LOW (ref 3.5–5.0)
Alkaline Phosphatase: 101 U/L (ref 38–126)
Anion gap: 10 (ref 5–15)
BUN: 24 mg/dL — ABNORMAL HIGH (ref 6–20)
CO2: 26 mmol/L (ref 22–32)
Calcium: 7.6 mg/dL — ABNORMAL LOW (ref 8.9–10.3)
Chloride: 93 mmol/L — ABNORMAL LOW (ref 98–111)
Creatinine, Ser: 1.2 mg/dL (ref 0.61–1.24)
GFR, Estimated: >60 mL/min (ref >60)
Glucose, Bld: 270 mg/dL — ABNORMAL HIGH (ref 70–99)
Potassium: 3.5 mmol/L (ref 3.5–5.1)
Sodium: 129 mmol/L — ABNORMAL LOW (ref 135–145)
Total Bilirubin: 1.8 mg/dL — ABNORMAL HIGH (ref 0.3–1.2)
Total Protein: 6 g/dL — ABNORMAL LOW (ref 6.5–8.1)

## 2022-04-17 LAB — CBC
HCT: 32.4 % — ABNORMAL LOW (ref 39.0–52.0)
Hemoglobin: 10.8 g/dL — ABNORMAL LOW (ref 13.0–17.0)
MCH: 28.3 pg (ref 26.0–34.0)
MCHC: 33.3 g/dL (ref 30.0–36.0)
MCV: 85 fL (ref 80.0–100.0)
Platelets: 120 10*3/uL — ABNORMAL LOW (ref 150–400)
RBC: 3.81 MIL/uL — ABNORMAL LOW (ref 4.22–5.81)
RDW: 15.5 % (ref 11.5–15.5)
WBC: 8.9 10*3/uL (ref 4.0–10.5)
nRBC: 0 % (ref 0.0–0.2)

## 2022-04-17 LAB — GLUCOSE, CAPILLARY
Glucose-Capillary: 235 mg/dL — ABNORMAL HIGH (ref 70–99)
Glucose-Capillary: 265 mg/dL — ABNORMAL HIGH (ref 70–99)
Glucose-Capillary: 228 mg/dL — ABNORMAL HIGH (ref 70–99)
Glucose-Capillary: 234 mg/dL — ABNORMAL HIGH (ref 70–99)

## 2022-04-17 MED ORDER — FUROSEMIDE 20 MG PO TABS
20.0000 mg | ORAL_TABLET | Freq: Every day | ORAL | Status: DC
  Administered 2022-04-17 – 2022-04-19 (×3): 20 mg via ORAL
  Filled 2022-04-17 (×3): qty 1

## 2022-04-17 NOTE — Progress Notes (Signed)
Mobility Specialist - Progress Note   04/17/22 1318  Oxygen Therapy  O2 Device Nasal Cannula  O2 Flow Rate (L/min) 2 L/min  Mobility  Activity Ambulated with assistance in hallway  Level of Assistance Standby assist, set-up cues, supervision of patient - no hands on  Assistive Device Front wheel walker  Distance Ambulated (ft) 270 ft  Activity Response Tolerated well  Mobility Referral Yes  $Mobility charge 1 Mobility   Pt received in bed and agreeable to mobility. Pt is MinA from sit-to-stand & standby during ambulation. No complaints during session. Pt to bed after session w/ all needs met.  Pre-mobility: 94 HR, 93% SpO2 (2L [place]) During mobility: 97 HR, 94% SpO2 (2L [place]) Post-mobility: 95 HR, 90% SPO2 (2L )  Set designer

## 2022-04-17 NOTE — Consult Note (Signed)
[facility] for Infectious Disease  Total days of antibiotics 3       Reason for Consult: MSSA bacteremia    Referring Physician: goodrich  Principal Problem:   AKI (acute kidney injury) ([place]) Active Problems:   Hyperlipidemia   Hypertension   Type 2 diabetes mellitus ([place])   Hyponatremia   UTI (urinary tract infection)   Nausea and vomiting   Diarrhea   Elevated liver enzymes   Acute lower UTI    HPI: Carl [name] is a 57 y.o. 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 Denied fevers, and came to ED on 2/1 for evaluation. He was found to have WBC 10. UA+ for pyuria, and urine cx showing MSSA. He was hypoxic which they ruled out PE. Was not hypotensive or had any lactic acidosis, thus not blood cx drawn on admit. On day 3 he did have high fever, which prompted blood cx to be drawn and now found to have MSSA bacteremia. Patient denies any wounds, worsening back pain prior to admit, no recent injury or diabetic ulcers. He still remains requiring oxygen supplementation. Generally does not requiring sup O2.    Denies recent sick contacts or trying any new food.  Denies cough, shortness of breath, or chest pain. Past Medical History:  Diagnosis Date   Diabetes mellitus without complication ([place])    Hyperlipidemia    Hypertension    Obesity     Allergies:  Allergies  Allergen Reactions   Morphine And Related Other (See Comments)    hallucinations    MEDICATIONS:  amLODipine  10 mg Oral Daily   atorvastatin  20 mg Oral Daily   insulin aspart  0-5 Units Subcutaneous QHS   insulin aspart  0-9 Units Subcutaneous TID WC   lidocaine  1 patch Transdermal Q24H    Social History   Tobacco Use   Smoking status: Former    Packs/day: 0.50    Years: 2.00    Total pack years: 1.00    Types: Cigarettes    Quit date: 10/05/1984    Years since quitting: 37.5    Smokeless tobacco: Never  Vaping Use   Vaping Use: Never used  Substance Use Topics   Alcohol use: Yes    Comment: occ   Drug use: No    Family History  Problem Relation Age of Onset   Diabetes Mother    Hypertension Mother    Hypertension Father     Review of Systems -  12 point ros is negative except what is mentioned in hpi  OBJECTIVE: Temp:  [98.2 F (36.8 C)-99.4 F (37.4 C)] 98.3 F (36.8 C) (02/05 1402) Pulse Rate:  [84-98] 84 (02/05 1402) Resp:  [18-22] 22 (02/05 1402) BP: (147-168)/(70-74) 155/70 (02/05 1402) SpO2:  [92 %-93 %] 93 % (02/05 1402) Physical Exam  Constitutional: He is oriented to person, place, and time. He appears well-developed and well-nourished. No distress.  HENT:  Mouth/Throat: Oropharynx is clear and moist. No oropharyngeal exudate.  Cardiovascular: Normal rate, regular rhythm and normal heart sounds. Exam reveals no gallop and no friction rub.  No murmur heard.  Pulmonary/Chest: Effort normal and breath sounds normal. No respiratory distress. He has no wheezes.  Abdominal: Soft. Bowel sounds are normal. He exhibits no distension. There is no tenderness.  Lymphadenopathy:  He has no cervical adenopathy.  Neurological: He is alert and oriented to person, place, and time.  Skin: Skin is warm and dry. No rash noted. No erythema. Scattered skin lesion but no overt drainage. Left foot TMA Psychiatric: He has a normal mood and affect. His behavior is normal.   LABS: Results for orders placed or performed during the hospital encounter of 04/13/22 (from the past 48 hour(s))  Glucose, capillary     Status: Abnormal   Collection Time: 04/15/22  5:31 PM  Result Value Ref Range   Glucose-Capillary 129 (H) 70 - 99 mg/dL    Comment: Glucose reference range applies only to samples taken after fasting for at least 8 hours.  Glucose, capillary     Status: Abnormal   Collection Time: 04/15/22  9:45 PM  Result Value Ref Range   Glucose-Capillary 152  (H) 70 - 99 mg/dL    Comment: Glucose reference range applies only to samples taken after fasting for at least 8 hours.  CBC     Status: Abnormal   Collection Time: 04/16/22  6:26 AM  Result Value Ref Range   WBC 8.0 4.0 - 10.5 K/uL   RBC 3.58 (L) 4.22 - 5.81 MIL/uL   Hemoglobin 10.0 (L) 13.0 - 17.0 g/dL   HCT 31.1 (L) 39.0 - 52.0 %   MCV 86.9 80.0 - 100.0 fL   MCH 27.9 26.0 - 34.0 pg   MCHC 32.2 30.0 - 36.0 g/dL   RDW 15.6 (H) 11.5 - 15.5 %   Platelets 99 (L) 150 - 400 K/uL    Comment: Immature Platelet Fraction may be clinically indicated, consider ordering this additional test [id number] PLATELET COUNT CONFIRMED BY SMEAR    nRBC 0.0 0.0 - 0.2 %    Comment: Performed at [facility], [place] [address], [place], [place] [address]  Comprehensive metabolic panel     Status: Abnormal   Collection Time: 04/16/22  6:26 AM  Result Value Ref Range   Sodium 130 (L) 135 - 145 mmol/L   Potassium 3.6 3.5 - 5.1 mmol/L   Chloride 97 (L) 98 - 111 mmol/L   CO2 21 (L) 22 - 32 mmol/L   Glucose, Bld 165 (H) 70 - 99 mg/dL    Comment: Glucose reference range applies only to samples taken after fasting for at least 8 hours.   BUN 29 (H) 6 - 20 mg/dL   Creatinine, Ser 1.13 0.61 - 1.24 mg/dL   Calcium 7.7 (L) 8.9 - 10.3 mg/dL   Total Protein 5.4 (L) 6.5 - 8.1 g/dL   Albumin 2.4 (L) 3.5 - 5.0 g/dL   AST 69 (H) 15 - 41 U/L   ALT 56 (H) 0 - 44 U/L   Alkaline Phosphatase 94 38 - 126 U/L   Total Bilirubin 2.4 (H) 0.3 - 1.2 mg/dL   GFR, Estimated >60 >60 mL/min    Comment: (NOTE) Calculated using the CKD-EPI Creatinine Equation (2021)    Anion gap 12 5 - 15    Comment: Performed at [facility], [place] [address] [place], [place] [address]  Glucose, capillary     Status: Abnormal   Collection Time: 04/16/22  7:41 AM  Result Value Ref Range   Glucose-Capillary 170 (H) 70 - 99 mg/dL    Comment: Glucose reference range applies only to samples taken after fasting for  at least 8 hours.  Glucose, capillary     Status: Abnormal   Collection Time: 04/16/22 11:52 AM  Result Value Ref Range  Glucose-Capillary 214 (H) 70 - 99 mg/dL    Comment: Glucose reference range applies only to samples taken after fasting for at least 8 hours.  Glucose, capillary     Status: Abnormal   Collection Time: 04/16/22  5:23 PM  Result Value Ref Range   Glucose-Capillary 236 (H) 70 - 99 mg/dL    Comment: Glucose reference range applies only to samples taken after fasting for at least 8 hours.  Glucose, capillary     Status: Abnormal   Collection Time: 04/16/22  8:47 PM  Result Value Ref Range   Glucose-Capillary 227 (H) 70 - 99 mg/dL    Comment: Glucose reference range applies only to samples taken after fasting for at least 8 hours.  Glucose, capillary     Status: Abnormal   Collection Time: 04/17/22  7:33 AM  Result Value Ref Range   Glucose-Capillary 235 (H) 70 - 99 mg/dL    Comment: Glucose reference range applies only to samples taken after fasting for at least 8 hours.  Comprehensive metabolic panel     Status: Abnormal   Collection Time: 04/17/22 10:34 AM  Result Value Ref Range   Sodium 129 (L) 135 - 145 mmol/L   Potassium 3.5 3.5 - 5.1 mmol/L   Chloride 93 (L) 98 - 111 mmol/L   CO2 26 22 - 32 mmol/L   Glucose, Bld 270 (H) 70 - 99 mg/dL    Comment: Glucose reference range applies only to samples taken after fasting for at least 8 hours.   BUN 24 (H) 6 - 20 mg/dL   Creatinine, Ser 4.01 0.61 - 1.24 mg/dL   Calcium 7.6 (L) 8.9 - 10.3 mg/dL   Total Protein 6.0 (L) 6.5 - 8.1 g/dL   Albumin 2.6 (L) 3.5 - 5.0 g/dL   AST 53 (H) 15 - 41 U/L   ALT 48 (H) 0 - 44 U/L   Alkaline Phosphatase 101 38 - 126 U/L   Total Bilirubin 1.8 (H) 0.3 - 1.2 mg/dL   GFR, Estimated >02 >72 mL/min    Comment: (NOTE) Calculated using the CKD-EPI Creatinine Equation (2021)    Anion gap 10 5 - 15    Comment: Performed at [facility], [address], [place],  [place] [address]  CBC     Status: Abnormal   Collection Time: 04/17/22 10:34 AM  Result Value Ref Range   WBC 8.9 4.0 - 10.5 K/uL   RBC 3.81 (L) 4.22 - 5.81 MIL/uL   Hemoglobin 10.8 (L) 13.0 - 17.0 g/dL   HCT 40.3 (L) 47.4 - 25.9 %   MCV 85.0 80.0 - 100.0 fL   MCH 28.3 26.0 - 34.0 pg   MCHC 33.3 30.0 - 36.0 g/dL   RDW 56.3 87.5 - 64.3 %   Platelets 120 (L) 150 - 400 K/uL   nRBC 0.0 0.0 - 0.2 %    Comment: Performed at [facility], [address], [place], [place] [address]  Glucose, capillary     Status: Abnormal   Collection Time: 04/17/22 11:06 AM  Result Value Ref Range   Glucose-Capillary 265 (H) 70 - 99 mg/dL    Comment: Glucose reference range applies only to samples taken after fasting for at least 8 hours.  Glucose, capillary     Status: Abnormal   Collection Time: 04/17/22  4:05 PM  Result Value Ref Range   Glucose-Capillary 228 (H) 70 - 99 mg/dL    Comment: Glucose reference range applies only  to samples taken after fasting for at least 8 hours.    MICRO: 2/2 urine cx MSSA 2/3 blood cx MSSA x 2 sets 2/5 blood cx PENDING IMAGING: No results found. TTE: thickened MV. No vegetations seen HISTORICAL MICRO/IMAGING  Assessment/Plan:  57yo M admitted with nausea and vomiting/Diarrhea x 6 days admitted for hypovolemia, dehydration with aki subsequently found to have MSSA uti and bacteremia. Unclear if ascending infection from urinary source since patient did not complain of dysuria.  Recommend full work up with TEE when he can tolerate. Mri of lumbar and thoracic spine- since aki improving  - continue with cefazolin 2gm IV q8hr for now - will continue to follow repeat blood cx from 2/5  - MSSA uti = pyelonephritis has been excluded  T2DM = may require increase in basal rate, having BS > 200.  Elzie Rings [facility] for Infectious Diseases [phone]

## 2022-04-17 NOTE — Progress Notes (Signed)
Patient is fully alert at this time and is very adamant that we keep the bed alarm off. I have pleaded to patient that we need to keep alarm on because he has fallen in the hospital. He said he is aware and that he knows to call from here on out. He knows the risks and does not want to harm his body again if he were to fall. He is irritable and persistent about keeping the alarm off. I told him if he does not call for assist to bathroom than I will have to bed alarm on for his safety. Will monitor patient closely.Carl [name]

## 2022-04-17 NOTE — Progress Notes (Signed)
Progress Note   Patient: Carl [name]:[id number] DOB: Sep 02, 1965 DOA: 04/13/2022     3 DOS: the patient was seen and examined on 04/17/2022   Brief hospital course: 57 year old male PMH including diabetes mellitus type 2 presented with 6-day history nausea, vomiting and diarrhea without suspicious history or factors reported.  Admitted for AKI, hyponatremia, nausea, vomiting, diarrhea, elevated liver enzymes.  Nausea and vomiting quickly resolved.  Renal function stabilized with fluids.  Now with MSSA bacteremia and UTI.  Assessment and Plan: AKI -- resolved FENa 0.1%, prerenal from dehydration in the setting of vomiting and diarrhea. BUN 33, creatinine 2.0 (baseline 0.4-0.8).  Hold home Cozaar and metformin.  Not on any diuretics. Resolved with IV fluids.  MSSA UTI.   Staphylococcus bacteremia probable MSSA Fever COVID and flu negative.  No abdominal or flank pain. Abdominal ultrasound with no acute findings. Chest x-ray no acute disease. Continue cefazolin.  ID consultation pending. MSSA in bladder. No symptoms. No hardware.  Elevated liver enzymes.  May be secondary to fatty liver. On admission: T. bili 3.4, AST 72, ALT 46, alk phos normal.  No history of heavy alcohol use.  Right upper quadrant ultrasound showing fatty liver without acute abnormality. Etiology unclear. Stopped statin.  Hepatitis panel negative.  T  AST and ALT trending down.  Total bilirubin trending down. Follow-up as an outpatient.   Hyponatremia, likely hypoosmolar In the setting of diarrhea/vomiting/poor p.o. intake.  Sodium 123, previously 134 in June 2023. Stable, mildly low. Will monitor.   Hypoxia without respiratory distress.   Acute hypoxemic respiratory failure considered on admission but no signs or symptoms or distress, therefore ruled out. Chest x-ray no acute disease.  Oxygen saturation 88% on room air, currently satting well on 2 L [place].  Lungs clear on exam.  Echo unrevealing.  Normal LVEF.  Grade 1  diastolic dysfunction.  No lower extremity edema.  D-dimer was positive but lower extremity Dopplers and VQ scan were negative. May be from obesity.  Monitor.   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 Hemoglobin A1c 7.8 Hold glipizide and metformin Continue sliding scale insulin.   Normocytic anemia Hemoglobin appears to be stable, at baseline.   Thrombocytopenia Up to 120 today.  Secondary to bacteremia.   Essential hypertension Now a bit high. Can resume antihypertensives tomorrow if still elevatged.   Hyperlipidemia Stopped statin given elevated transaminases.  Nausea, vomiting and diarrhea  Resolved.    Diarrhea Appears to have resolved. No abdominal pain.  No fever or significant leukocytosis.  COVID and flu negative. C. difficile negative.  GI pathogen panel negative.      Subjective:  Feels much better today Up to chair Breathing fine Wants to go home  Physical Exam: Vitals:   04/16/22 0948 04/16/22 1425 04/16/22 1731 04/16/22 2045  BP: (!) 161/72 (!) 161/76 (!) 168/74 (!) 147/73  Pulse: 96 100 98 89  Resp: (!) 24 20 20 18   Temp:  99.8 F (37.7 C) 99.4 F (37.4 C) 98.2 F (36.8 C)  TempSrc:  Oral Oral Oral  SpO2: 93% 93% 92% 93%  Weight:      Height:       Physical Exam Vitals reviewed.  Constitutional:      General: He is not in acute distress.    Appearance: He is not ill-appearing or toxic-appearing.  Cardiovascular:     Rate and Rhythm: Normal rate and regular rhythm.     Heart  sounds: No murmur heard. Pulmonary:     Effort: Pulmonary effort is normal. No respiratory distress.     Breath sounds: No wheezing, rhonchi or rales.  Neurological:     Mental Status: He is alert.  Psychiatric:        Mood and Affect: Mood normal.        Behavior: Behavior normal.   Data Reviewed: CBG stable Na+ 129 Creatinine 1.20 AST 53 ALT 48 Hgb 10.8 Plts up to 120  Family  Communication: none  Disposition: Status is: Inpatient Remains inpatient appropriate because: bacteremia  Planned Discharge Destination: Home    Time spent: 20 minutes  Author: Murray Hodgkins, MD 04/17/2022 10:01 AM  For on call review [email or web address].

## 2022-04-18 ENCOUNTER — Inpatient Hospital Stay (HOSPITAL_COMMUNITY): Payer: Self-pay

## 2022-04-18 DIAGNOSIS — R7881 Bacteremia: Secondary | ICD-10-CM | POA: Insufficient documentation

## 2022-04-18 DIAGNOSIS — R0902 Hypoxemia: Secondary | ICD-10-CM | POA: Insufficient documentation

## 2022-04-18 DIAGNOSIS — D696 Thrombocytopenia, unspecified: Secondary | ICD-10-CM | POA: Insufficient documentation

## 2022-04-18 LAB — COMPREHENSIVE METABOLIC PANEL
ALT: 37 U/L (ref 0–44)
AST: 51 U/L — ABNORMAL HIGH (ref 15–41)
Albumin: 2.4 g/dL — ABNORMAL LOW (ref 3.5–5.0)
Alkaline Phosphatase: 96 U/L (ref 38–126)
Anion gap: 10 (ref 5–15)
BUN: 22 mg/dL — ABNORMAL HIGH (ref 6–20)
CO2: 27 mmol/L (ref 22–32)
Calcium: 7.7 mg/dL — ABNORMAL LOW (ref 8.9–10.3)
Chloride: 95 mmol/L — ABNORMAL LOW (ref 98–111)
Creatinine, Ser: 1.09 mg/dL (ref 0.61–1.24)
GFR, Estimated: >60 mL/min (ref >60)
Glucose, Bld: 226 mg/dL — ABNORMAL HIGH (ref 70–99)
Potassium: 3.7 mmol/L (ref 3.5–5.1)
Sodium: 132 mmol/L — ABNORMAL LOW (ref 135–145)
Total Bilirubin: 1.7 mg/dL — ABNORMAL HIGH (ref 0.3–1.2)
Total Protein: 5.8 g/dL — ABNORMAL LOW (ref 6.5–8.1)

## 2022-04-18 LAB — CULTURE, BLOOD (ROUTINE X 2): Special Requests: ADEQUATE

## 2022-04-18 LAB — GLUCOSE, CAPILLARY
Glucose-Capillary: 235 mg/dL — ABNORMAL HIGH (ref 70–99)
Glucose-Capillary: 266 mg/dL — ABNORMAL HIGH (ref 70–99)
Glucose-Capillary: 217 mg/dL — ABNORMAL HIGH (ref 70–99)
Glucose-Capillary: 216 mg/dL — ABNORMAL HIGH (ref 70–99)

## 2022-04-18 MED ORDER — INSULIN GLARGINE-YFGN 100 UNIT/ML ~~LOC~~ SOLN
5.0000 [IU] | Freq: Every day | SUBCUTANEOUS | Status: DC
  Administered 2022-04-18 – 2022-04-19 (×2): 5 [IU] via SUBCUTANEOUS
  Filled 2022-04-18 (×3): qty 0.05

## 2022-04-18 MED ORDER — GADOBUTROL 1 MMOL/ML IV SOLN
10.0000 mL | Freq: Once | INTRAVENOUS | Status: AC | PRN
  Administered 2022-04-18: 10 mL via INTRAVENOUS

## 2022-04-18 NOTE — Progress Notes (Signed)
    Carl [name] has been requested to perform a transesophageal echocardiogram on Carl [name] for evaluation of bacteremia. Patient is a 57 year old male with a past medical history of type 2 DM, HTN, HLD, diabetic foot ulcer s/p left transmetatarsal amputation 04/2020. Patient presented to the ED on 2/1 complaining of nausea, vomiting. Now admitted for treatment of AKI (resolved), MSSA UTI, staphylococcus bacteremia (probable MSSA). Echocardiogram 04/15/22 showed EF 55-60%, no regional wall motion abnormalities, grade I diastolic dysfunction, normal RV systolic function.   TEE requested for further evaluation.     After careful review of history and examination, the risks and benefits of transesophageal echocardiogram have been explained including risks of esophageal damage, perforation (1:10,000 risk), bleeding, pharyngeal hematoma as well as other potential complications associated with conscious sedation including aspiration, arrhythmia, respiratory failure and death. Alternatives to treatment were discussed, questions were answered. Patient is willing to proceed.   Margie Billet, PA-C 04/18/2022 12:46 PM

## 2022-04-18 NOTE — Progress Notes (Addendum)
Progress Note   Patient: Carl [name] PPI:[id number] DOB: 1965-08-10 DOA: 04/13/2022     4 DOS: the patient was seen and examined on 04/18/2022   Brief hospital course: 57 year old male PMH including diabetes mellitus type 2 presented with 6-day history nausea, vomiting and diarrhea without suspicious history or factors reported.  Admitted for AKI, hyponatremia, nausea, vomiting, diarrhea, elevated liver enzymes.  Nausea and vomiting quickly resolved.  Renal function stabilized with fluids.  Now with MSSA bacteremia, UTI.  Workup to exclude other sources includes pending MRI and TEE.  Assessment and Plan: AKI -- resolved FENa 0.1%, prerenal from dehydration in the setting of vomiting and diarrhea. BUN 33, creatinine 2.0 (baseline 0.4-0.8).  Hold home Cozaar and metformin.  Not on any diuretics. Treated with IVF and resolved. Can resume Cozaar on discharge.   Staphylococcus bacteremia probable MSSA MSSA UTI.  Fever COVID and flu negative.  No abdominal or flank pain. Abdominal ultrasound with no acute findings. Chest x-ray no acute disease. Continue cefazolin.  ID following, not convinced from MSSA in bladder. No symptoms. No hardware. MRI thoracic and lumbar spine today, TEE planned tomorrow.   Elevated liver enzymes.  May be secondary to fatty liver. On admission: T. bili 3.4, AST 72, ALT 46, alk phos normal.  No history of heavy alcohol use.  Right upper quadrant ultrasound showing fatty liver without acute abnormality. Etiology unclear. Stopped statin.  Hepatitis panel negative.  T  AST and ALT trending down.  Total bilirubin trending down. Follow-up as an outpatient.   Hyponatremia, likely hypoosmolar In the setting of diarrhea/vomiting/poor p.o. intake.  Sodium 123, previously 134 in June 2023. Improving, mildly low. Will monitor.   Hypoxia without respiratory distress.   Acute hypoxemic respiratory failure considered on admission but no signs or symptoms or distress, therefore ruled  out. Chest x-ray no acute disease.  Oxygen saturation 88% on room air, currently satting well on 2 L [place].  Lungs clear on exam.  Echo unrevealing.  Normal LVEF.  Grade 1 diastolic dysfunction.  No lower extremity edema.  D-dimer was positive but lower extremity Dopplers and VQ scan were negative. May be from obesity. Wean oxygen as tolerated.   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 Hemoglobin A1c 7.8 Hold glipizide and metformin CBG high, will add Semglee while inpatient. Continue sliding scale insulin.   Normocytic anemia Hemoglobin appears to be stable, at baseline.   Thrombocytopenia Recovering.  Secondary to bacteremia. Follow intermittently.   Essential hypertension Stable. Continue Norvasc. Can resume Cozaar on discharge.    Hyperlipidemia Stopped statin given elevated transaminases.   Nausea, vomiting and diarrhea  Resolved.    Diarrhea Resolved. No abdominal pain.  No fever or significant leukocytosis.  COVID and flu negative. C. difficile negative.  GI pathogen panel negative.        Subjective:  Feels better  Physical Exam: Vitals:   04/17/22 2148 04/17/22 2148 04/18/22 0534 04/18/22 1403  BP:  (!) 154/79 (!) 163/74 (!) 146/71  Pulse:  85 76 76  Resp: 20 20  20   Temp: 97.7 F (36.5 C)  98.1 F (36.7 C) 99.1 F (37.3 C)  TempSrc:   Oral Oral  SpO2:  92% 92% 93%  Weight:      Height:       Physical Exam Vitals reviewed.  Constitutional:      General: He is not in acute distress.    Appearance: He is not ill-appearing  or toxic-appearing.  Cardiovascular:     Rate and Rhythm: Normal rate and regular rhythm.     Heart sounds: No murmur heard. Pulmonary:     Effort: Pulmonary effort is normal. No respiratory distress.     Breath sounds: No wheezing, rhonchi or rales.  Neurological:     Mental Status: He is alert.  Psychiatric:        Mood and Affect: Mood normal.         Behavior: Behavior normal.     Data Reviewed: CBG 200s > adjust insulin Na+ up to 132 AST down to 51, ALT now WNL Tbili down to 1.7  Family Communication: daughter Raquel Sarna at bedside  Disposition: Status is: Inpatient Remains inpatient appropriate because: bacteremia  Planned Discharge Destination: Home    Time spent: 25 minutes  Author: Murray Hodgkins, MD 04/18/2022 4:32 PM  For on call review [email or web address].

## 2022-04-18 NOTE — Evaluation (Signed)
Physical Therapy Evaluation Patient Details Name: Carl [name] MRN: [id number] DOB: 07/06/1965 Today's Date: 04/18/2022  History of Present Illness  Pt is 57 yo male admitted 04/13/22 with nausea, vomiting, and diarrhea.  He was admitted with AKI, found to have MSSA bacteremia and UTI.  Pt with hx including but not limited to DM, HLD, HTN, L transmet amputation  Clinical Impression  Pt admitted with above diagnosis. At baseline, pt lives alone and is independent.  At d/c he plans to go to his girlfriend's house where he will have intermittent supervision.  Today, pt was supervision - min guard level for transfers and to ambulate 300' with RW.  He was on 2 L O2 which is not his baseline and reports feeling much weaker than baseline.  Pt expected to advance well with therapy and likely no needs at d/c.  Pt currently with functional limitations due to the deficits listed below (see PT Problem List). Pt will benefit from skilled PT to increase their independence and safety with mobility to allow discharge to the venue listed below.          Recommendations for follow up therapy are one component of a multi-disciplinary discharge planning process, led by the attending physician.  Recommendations may be updated based on patient status, additional functional criteria and insurance authorization.  Follow Up Recommendations No PT follow up      Assistance Recommended at Discharge Intermittent Supervision/Assistance  Patient can return home with the following  Assistance with cooking/housework;Help with stairs or ramp for entrance    Equipment Recommendations None recommended by PT  Recommendations for Other Services       Functional Status Assessment Patient has had a recent decline in their functional status and demonstrates the ability to make significant improvements in function in a reasonable and predictable amount of time.     Precautions / Restrictions Precautions Precautions: Fall       Mobility  Bed Mobility Overal bed mobility: Needs Assistance Bed Mobility: Sit to Supine       Sit to supine: Supervision        Transfers Overall transfer level: Needs assistance Equipment used: Rolling walker (2 wheels) Transfers: Sit to/from Stand Sit to Stand: Min guard                Ambulation/Gait Ambulation/Gait assistance: Min guard Gait Distance (Feet): 300 Feet Assistive device: Rolling walker (2 wheels) Gait Pattern/deviations: Step-through pattern, Decreased stride length, Trunk flexed Gait velocity: decreased     General Gait Details: min cues for RW proximity  Stairs            Wheelchair Mobility    Modified Rankin (Stroke Patients Only)       Balance Overall balance assessment: Needs assistance Sitting-balance support: No upper extremity supported Sitting balance-Leahy Scale: Good     Standing balance support: No upper extremity supported, Bilateral upper extremity supported Standing balance-Leahy Scale: Fair Standing balance comment: RW to ambulate but could static stand and complete standing adls without UE support                             Pertinent Vitals/Pain Pain Assessment Pain Assessment: No/denies pain    Home Living Family/patient expects to be discharged to:: Private residence Living Arrangements: Spouse/significant other (Pt lives alone typically but will stay with girlfriend at d/c) Available Help at Discharge: Other (Comment);Available PRN/intermittently Type of Home: House Home Access: Stairs to enter Entrance  Stairs-Rails: None Entrance Stairs-Number of Steps: 1   Home Layout: One level Home Equipment: Cane - single point;Rollator (4 wheels);Rolling Walker (2 wheels);Crutches      Prior Function Prior Level of Function : Independent/Modified Independent;Driving             Mobility Comments: Normally ambulates in community without AD ADLs Comments: Independent with all adls and  iadls     Hand Dominance        Extremity/Trunk Assessment   Upper Extremity Assessment Upper Extremity Assessment: Overall WFL for tasks assessed    Lower Extremity Assessment Lower Extremity Assessment: Overall WFL for tasks assessed    Cervical / Trunk Assessment Cervical / Trunk Assessment: Normal  Communication   Communication: No difficulties  Cognition Arousal/Alertness: Awake/alert Behavior During Therapy: WFL for tasks assessed/performed Overall Cognitive Status: Within Functional Limits for tasks assessed                                 General Comments: cooperative, pleasant, followed commands during session        General Comments General comments (skin integrity, edema, etc.): Reports feeling weaker than normal.  Pt was on 2 L O2 with sats97% throughout session    Exercises     Assessment/Plan    PT Assessment Patient needs continued PT services  PT Problem List Decreased strength;Cardiopulmonary status limiting activity;Decreased range of motion;Decreased activity tolerance;Decreased balance;Decreased mobility;Decreased knowledge of precautions;Decreased safety awareness;Decreased knowledge of use of DME       PT Treatment Interventions DME instruction;Therapeutic exercise;Gait training;Balance training;Stair training;Functional mobility training;Therapeutic activities;Patient/family education    PT Goals (Current goals can be found in the Care Plan section)  Acute Rehab PT Goals Patient Stated Goal: return home PT Goal Formulation: With patient/family Time For Goal Achievement: 05/02/22 Potential to Achieve Goals: Good    Frequency Min 3X/week     Co-evaluation               AM-PAC PT "6 Clicks" Mobility  Outcome Measure Help needed turning from your back to your side while in a flat bed without using bedrails?: None Help needed moving from lying on your back to sitting on the side of a flat bed without using bedrails?:  None Help needed moving to and from a bed to a chair (including a wheelchair)?: A Little Help needed standing up from a chair using your arms (e.g., wheelchair or bedside chair)?: A Little Help needed to walk in hospital room?: A Little Help needed climbing 3-5 steps with a railing? : A Little 6 Click Score: 20    End of Session Equipment Utilized During Treatment: Gait belt;Oxygen Activity Tolerance: Patient tolerated treatment well Patient left: in bed;with call bell/phone within reach Nurse Communication: Mobility status PT Visit Diagnosis: Other abnormalities of gait and mobility (R26.89);Muscle weakness (generalized) (M62.81)    Time: 8315-1761 PT Time Calculation (min) (ACUTE ONLY): 30 min   Charges:   PT Evaluation $PT Eval Low Complexity: 1 Low PT Treatments $Gait Training: 8-22 mins        Abran Richard, PT Acute Rehab [facility] [place] 04/18/2022, 3:24 PM

## 2022-04-18 NOTE — Progress Notes (Signed)
[facility] for Infectious Disease    Date of Admission:  04/13/2022   Total days of antibiotics 4   ID: Carl [name] is a 57 y.o. male with  MSSA bacteremia Principal Problem:   AKI (acute kidney injury) ([place]) Active Problems:   Hyperlipidemia   Hypertension   Type 2 diabetes mellitus (HCC)   Hyponatremia   UTI (urinary tract infection)   Nausea and vomiting   Diarrhea   Elevated liver enzymes   Acute lower UTI    Subjective: Feeling slightly better but still requiring supplemental oxygen. Noticing some low back pain but unclear if related to hospital bed. No lower extremity weakness but diffusely weak.  Medications:   amLODipine  10 mg Oral Daily   atorvastatin  20 mg Oral Daily   furosemide  20 mg Oral Daily   insulin aspart  0-5 Units Subcutaneous QHS   insulin aspart  0-9 Units Subcutaneous TID WC   insulin glargine-yfgn  5 Units Subcutaneous Daily   lidocaine  1 patch Transdermal Q24H    Objective: Vital signs in last 24 hours: Temp:  [97.7 F (36.5 C)-99.1 F (37.3 C)] 99.1 F (37.3 C) (02/06 1403) Pulse Rate:  [76-85] 76 (02/06 1403) Resp:  [20] 20 (02/06 1403) BP: (146-163)/(71-79) 146/71 (02/06 1403) SpO2:  [92 %-93 %] 93 % (02/06 1403)  Physical Exam  Constitutional: He is oriented to person, place, and time. He appears well-developed and well-nourished. No distress.  HENT:  Mouth/Throat: Oropharynx is clear and moist. No oropharyngeal exudate.  Cardiovascular: Normal rate, regular rhythm and normal heart sounds. Exam reveals no gallop and no friction rub.  No murmur heard.  Pulmonary/Chest: Effort normal and breath sounds normal. No respiratory distress. He has no wheezes.  Abdominal: Soft. Bowel sounds are normal. He exhibits no distension. There is no tenderness.  Lymphadenopathy:  He has no cervical adenopathy.  Neurological: He is alert and oriented to person, place, and time.  Skin: Skin is warm and dry. No rash noted. No erythema.   Psychiatric: He has a normal mood and affect. His behavior is normal.    Lab Results Recent Labs    04/16/22 0626 04/17/22 1034 04/18/22 0524  WBC 8.0 8.9  --   HGB 10.0* 10.8*  --   HCT 31.1* 32.4*  --   NA 130* 129* 132*  K 3.6 3.5 3.7  CL 97* 93* 95*  CO2 21* 26 27  BUN 29* 24* 22*  CREATININE 1.13 1.20 1.09   Liver Panel Recent Labs    04/17/22 1034 04/18/22 0524  PROT 6.0* 5.8*  ALBUMIN 2.6* 2.4*  AST 53* 51*  ALT 48* 37  ALKPHOS 101 96  BILITOT 1.8* 1.7*   Sedimentation Rate No results for input(s): "ESRSEDRATE" in the last 72 hours. C-Reactive Protein No results for input(s): "CRP" in the last 72 hours.  Microbiology: 2/5 blood cx ngtd 2/3 blood cx MSSA 2/2 urine cx MSSA Staphylococcus aureus      MIC    CIPROFLOXACIN <=0.5 SENSI... Sensitive    CLINDAMYCIN RESISTANT Resistant    ERYTHROMYCIN RESISTANT Resistant    GENTAMICIN <=0.5 SENSI... Sensitive    Inducible Clindamycin POSITIVE Resistant    OXACILLIN 0.5 SENSITIVE Sensitive    RIFAMPIN <=0.5 SENSI... Sensitive    TETRACYCLINE <=1 SENSITIVE Sensitive    TRIMETH/SULFA <=10 SENSIT... Sensitive    VANCOMYCIN 1 SENSITIVE Sensitive    Studies/Results: No results found.   Assessment/Plan: Complicated MSSA bacteremia = source is unclear  at this point. Awaiting MRI (hopefully today) and TEE (Scheduled for Thursday morning) to see if there are other nidus of infection.  Continue with cefazolin. If blood cx remain clear on Thursday, can place picc line at that time. Anticipate 4 wk of iv abtx  Transaminitis = improving; thought to have element of hepatic steatotis  Aki = improved.now back to baseline  T2DM - currently on insulin since glipizide and metformin on hold.    [facility] Pager: [phone]  04/18/2022, 3:19 PM

## 2022-04-19 ENCOUNTER — Inpatient Hospital Stay (HOSPITAL_COMMUNITY): Payer: Self-pay

## 2022-04-19 LAB — CBC
HCT: 32.2 % — ABNORMAL LOW (ref 39.0–52.0)
Hemoglobin: 10.5 g/dL — ABNORMAL LOW (ref 13.0–17.0)
MCH: 28.5 pg (ref 26.0–34.0)
MCHC: 32.6 g/dL (ref 30.0–36.0)
MCV: 87.3 fL (ref 80.0–100.0)
Platelets: 181 10*3/uL (ref 150–400)
RBC: 3.69 MIL/uL — ABNORMAL LOW (ref 4.22–5.81)
RDW: 15.3 % (ref 11.5–15.5)
WBC: 9.2 10*3/uL (ref 4.0–10.5)
nRBC: 0 % (ref 0.0–0.2)

## 2022-04-19 LAB — GLUCOSE, CAPILLARY
Glucose-Capillary: 270 mg/dL — ABNORMAL HIGH (ref 70–99)
Glucose-Capillary: 248 mg/dL — ABNORMAL HIGH (ref 70–99)
Glucose-Capillary: 218 mg/dL — ABNORMAL HIGH (ref 70–99)
Glucose-Capillary: 258 mg/dL — ABNORMAL HIGH (ref 70–99)

## 2022-04-19 LAB — BRAIN NATRIURETIC PEPTIDE: B Natriuretic Peptide: 201.1 pg/mL — ABNORMAL HIGH (ref 0.0–100.0)

## 2022-04-19 LAB — COMPREHENSIVE METABOLIC PANEL
ALT: 31 U/L (ref 0–44)
AST: 42 U/L — ABNORMAL HIGH (ref 15–41)
Albumin: 2.2 g/dL — ABNORMAL LOW (ref 3.5–5.0)
Alkaline Phosphatase: 113 U/L (ref 38–126)
Anion gap: 11 (ref 5–15)
BUN: 16 mg/dL (ref 6–20)
CO2: 27 mmol/L (ref 22–32)
Calcium: 7.6 mg/dL — ABNORMAL LOW (ref 8.9–10.3)
Chloride: 95 mmol/L — ABNORMAL LOW (ref 98–111)
Creatinine, Ser: 0.91 mg/dL (ref 0.61–1.24)
GFR, Estimated: >60 mL/min (ref >60)
Glucose, Bld: 235 mg/dL — ABNORMAL HIGH (ref 70–99)
Potassium: 3.4 mmol/L — ABNORMAL LOW (ref 3.5–5.1)
Sodium: 133 mmol/L — ABNORMAL LOW (ref 135–145)
Total Bilirubin: 1 mg/dL (ref 0.3–1.2)
Total Protein: 5.8 g/dL — ABNORMAL LOW (ref 6.5–8.1)

## 2022-04-19 MED ORDER — MELATONIN 5 MG PO TABS
5.0000 mg | ORAL_TABLET | Freq: Every evening | ORAL | Status: DC | PRN
  Administered 2022-04-19 – 2022-04-21 (×3): 5 mg via ORAL
  Filled 2022-04-19 (×3): qty 1

## 2022-04-19 MED ORDER — POTASSIUM CHLORIDE CRYS ER 20 MEQ PO TBCR: 40.0000 meq | EXTENDED_RELEASE_TABLET | Freq: Once | ORAL | Status: DC

## 2022-04-19 MED ORDER — LOSARTAN POTASSIUM 50 MG PO TABS
50.0000 mg | ORAL_TABLET | Freq: Every day | ORAL | Status: DC
  Administered 2022-04-19 – 2022-04-22 (×4): 50 mg via ORAL
  Filled 2022-04-19 (×4): qty 1

## 2022-04-19 MED ORDER — SODIUM CHLORIDE 0.9 % IV SOLN: INTRAVENOUS | Status: DC

## 2022-04-19 MED ORDER — POTASSIUM CHLORIDE 10 MEQ/100ML IV SOLN: 10.0000 meq | INTRAVENOUS | Status: DC

## 2022-04-19 NOTE — Inpatient Diabetes Management (Signed)
Inpatient Diabetes Program Recommendations  AACE/ADA: New Consensus Statement on Inpatient Glycemic Control (2015)  Target Ranges:  Prepandial:   less than 140 mg/dL      Peak postprandial:   less than 180 mg/dL (1-2 hours)      Critically ill patients:  140 - 180 mg/dL   Lab Results  Component Value Date   GLUCAP 248 (H) 04/19/2022   HGBA1C 7.8 (H) 04/14/2022    Review of Glycemic Control  Diabetes history: DM2 Outpatient Diabetes medications: glipizide 5 mg QD, metformin 1000 BID Current orders for Inpatient glycemic control: Semglee 5 QD, Novolog 0-9 units TID with meals and 0-5 HS  HgbA1C - 7.8%  Inpatient Diabetes Program Recommendations:    Increase Semglee to 10 units BID  Continue to follow.  Thank you. Lorenda Peck, RD, LDN, [place] Inpatient Diabetes Coordinator [phone]

## 2022-04-19 NOTE — Progress Notes (Signed)
PROGRESS NOTE  Carl [name]  VWU:[id number] DOB: August 12, 1965 DOA: 04/13/2022 PCP: Eppie Gibson, MD   Brief Narrative: Patient is a 57 year old male with history of diabetes type 2 who presented with 6-day history of nausea, vomiting, diarrhea.  Lab work showed AKI, hyponatremia, elevated liver enzymes.  Hospital course remarkable for finding of MSSA bacteremia, UTI.  ID consulted and following.  Lumbar MRI showed active septic arthritis at L4-L5 with no fluid collection or abscess.  Plan for TEE tomorrow, pending PICC line placement  Assessment & Plan:  Principal Problem:   AKI (acute kidney injury) ([place]) Active Problems:   Hyperlipidemia   Hypertension   Type 2 diabetes mellitus ([place])   Hyponatremia   UTI (urinary tract infection)   Nausea and vomiting   Diarrhea   Elevated liver enzymes   Acute lower UTI   Staphylococcus aureus bacteremia   Hypoxia   Thrombocytopenia ([facility])  AKI: Resolved likely from dehydration in the setting of vomiting and diarrhea.  Currently kidney function stable.  IV fluids stopped  MSSA bacteremia/MSSA UTI: Febrile during this hospitalization.  Chest x-ray did not show any acute findings.  No abdominal pain.  No presence of hardware.  Lumbar spine MRI showed acute septic arthritis at L4-L5 without any fluid collection or abscess.  Plan for TEE. Might need prolonged antibiotics course, pending PICC line placement  Elevated liver enzymes: No history of heavy alcohol use.  Right upper quadrant ultrasound showed fatty liver. Hepatitis panel negative.  Liver enzymes improving.  Monitor as an outpatient  Hyponatremia: Improved.  Acute hypoxic respiratory failure:  Chest Xray  negative findings.  Lungs are clear on auscultation.  Echo without significant findings, showed normal left ventricle ejection fraction, grade 1 diastolic dysfunction.  D-dimer was positive but lower extremity Doppler, VQ scan negative.  Obesity hypoventilation syndrome is a possibility.   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  Hemoglobin A1c of 7.8.  On glipizide, metformin at home.  Continue current insulin regimen while at hospital.  Normocytic anemia/thrombocytopenia: Currently hemoglobin/platelet count stable.  Thrombocytopenia could be from sepsis/bacteremia.  Continue to monitor  Hypertension: On Norvasc.  Home losartan on hold,resumed  Hypokalemia: Supplemented with potassium  Hyperlipidemia: On lipitor  Nausea, vomiting, diarrhea: Present on admission.  Resolved .C. difficile, GI pathogen panel negative.  Deconditioning/weakness: PT saw the patient, no follow-up recommended  Morbid obesity: BMI of  40        DVT prophylaxis:Place and maintain sequential compression device Start: 04/16/22 0816     Code Status: Full Code  Family Communication: None at bedside  Patient status:Inpatient  Patient is from :Home  Anticipated discharge GN:FAOZ  Estimated DC date: After full workup of bacteremia, pending TEE, PICC line placement.  Needs ID recommendation for antibiotics before discharge   Consultants: ID  Procedures:MRI  Antimicrobials:  Anti-infectives (From admission, onward)    Start     Dose/Rate Route Frequency Ordered Stop   04/16/22 1800  vancomycin (VANCOREADY) IVPB 1500 mg/300 mL  Status:  Discontinued        1,500 mg 150 mL/hr over 120 Minutes Intravenous Every 24 hours 04/15/22 1548 04/16/22 1148   04/16/22 1800  vancomycin (VANCOCIN) IVPB 1000 mg/200 mL premix  Status:  Discontinued        1,000 mg 200 mL/hr over 60 Minutes Intravenous Every 12 hours 04/16/22 1148 04/16/22 1314   04/16/22 1400  ceFAZolin (  ANCEF) IVPB 2g/100 mL premix        2 g 200 mL/hr over 30 Minutes Intravenous Every 8 hours 04/16/22 1314     04/15/22 1830  vancomycin (VANCOREADY) IVPB 500 mg/100 mL       See Hyperspace for full Linked Orders  Report.   500 mg 100 mL/hr over 60 Minutes Intravenous  Once 04/15/22 1531 04/15/22 2005   04/15/22 1630  vancomycin (VANCOREADY) IVPB 2000 mg/400 mL       See Hyperspace for full Linked Orders Report.   2,000 mg 200 mL/hr over 120 Minutes Intravenous  Once 04/15/22 1531 04/15/22 1858   04/15/22 0500  cefTRIAXone (ROCEPHIN) 1 g in sodium chloride 0.9 % 100 mL IVPB  Status:  Discontinued        1 g 200 mL/hr over 30 Minutes Intravenous Every 24 hours 04/14/22 0515 04/14/22 0859   04/14/22 0500  cefTRIAXone (ROCEPHIN) 1 g in sodium chloride 0.9 % 100 mL IVPB        1 g 200 mL/hr over 30 Minutes Intravenous  Once 04/14/22 0456 04/14/22 0700       Subjective: Patient seen and examined at bedside today.  Hemodynamically stable.  Comfortable.  On 2 L of oxygen.  Denies new worsening shortness of breath or cough.  Complains of some lower  back pain  Objective: Vitals:   04/17/22 2148 04/18/22 0534 04/18/22 1403 04/19/22 0522  BP: (!) 154/79 (!) 163/74 (!) 146/71 (!) 163/67  Pulse: 85 76 76 76  Resp: 20  20 18   Temp:  98.1 F (36.7 C) 99.1 F (37.3 C) 98.7 F (37.1 C)  TempSrc:  Oral Oral Oral  SpO2: 92% 92% 93% 95%  Weight:      Height:        Intake/Output Summary (Last 24 hours) at 04/19/2022 0815 Last data filed at 04/19/2022 0938 Gross per 24 hour  Intake --  Output 700 ml  Net -700 ml   Filed Weights   04/15/22 0623  Weight: 126.6 kg    Examination:  General exam: Overall comfortable, not in distress, obese HEENT: PERRL Respiratory system: Mildly  diminished air sounds bilaterally, no wheezes or crackles  Cardiovascular system: S1 & S2 heard, RRR.  Gastrointestinal system: Abdomen is nondistended, soft and nontender. Central nervous system: Alert and oriented Extremities: No edema, no clubbing ,no cyanosis Skin: No rashes, no ulcers,no icterus     Data Reviewed: I have personally reviewed following labs and imaging studies  CBC: Recent Labs  Lab  04/14/22 0629 04/15/22 0606 04/16/22 0626 04/17/22 1034 04/19/22 0619  WBC 10.3 7.2 8.0 8.9 9.2  HGB 10.8* 9.7* 10.0* 10.8* 10.5*  HCT 32.1* 29.4* 31.1* 32.4* 32.2*  MCV 83.6 84.7 86.9 85.0 87.3  PLT 143* 125* 99* 120* 182   Basic Metabolic Panel: Recent Labs  Lab 04/15/22 0606 04/16/22 0626 04/17/22 1034 04/18/22 0524 04/19/22 0619  NA 127* 130* 129* 132* 133*  K 3.8 3.6 3.5 3.7 3.4*  CL 93* 97* 93* 95* 95*  CO2 24 21* 26 27 27   GLUCOSE 78 165* 270* 226* 235*  BUN 36* 29* 24* 22* 16  CREATININE 1.68* 1.13 1.20 1.09 0.91  CALCIUM 7.7* 7.7* 7.6* 7.7* 7.6*     Recent Results (from the past 240 hour(s))  Resp panel by RT-PCR (RSV, Flu A&B, Covid) Anterior Nasal Swab     Status: None   Collection Time: 04/13/22 10:26 PM   Specimen: Anterior Nasal Swab  Result Value Ref  Range Status   SARS Coronavirus 2 by RT PCR NEGATIVE NEGATIVE Final    Comment: (NOTE) SARS-CoV-2 target nucleic acids are NOT DETECTED.  The SARS-CoV-2 RNA is generally detectable in upper respiratory specimens during the acute phase of infection. The lowest concentration of SARS-CoV-2 viral copies this assay can detect is 138 copies/mL. A negative result does not preclude SARS-Cov-2 infection and should not be used as the sole basis for treatment or other patient management decisions. A negative result may occur with  improper specimen collection/handling, submission of specimen other than nasopharyngeal swab, presence of viral mutation(s) within the areas targeted by this assay, and inadequate number of viral copies(<138 copies/mL). A negative result must be combined with clinical observations, patient history, and epidemiological information. The expected result is Negative.  Fact Sheet for Patients:  BloggerCourse.comhttps://www.fda.gov/media/152166/download  Fact Sheet for Healthcare Providers:  SeriousBroker.ithttps://www.fda.gov/media/152162/download  This test is no t yet approved or cleared by the [place] FDA and   has been authorized for detection and/or diagnosis of SARS-CoV-2 by FDA under an Emergency Use Authorization (EUA). This EUA will remain  in effect (meaning this test can be used) for the duration of the COVID-19 declaration under Section 564(b)(1) of the Act, 21 U.S.C.section 360bbb-3(b)(1), unless the authorization is terminated  or revoked sooner.       Influenza A by PCR NEGATIVE NEGATIVE Final   Influenza B by PCR NEGATIVE NEGATIVE Final    Comment: (NOTE) The Xpert Xpress SARS-CoV-2/FLU/RSV plus assay is intended as an aid in the diagnosis of influenza from Nasopharyngeal swab specimens and should not be used as a sole basis for treatment. Nasal washings and aspirates are unacceptable for Xpert Xpress SARS-CoV-2/FLU/RSV testing.  Fact Sheet for Patients: BloggerCourse.comhttps://www.fda.gov/media/152166/download  Fact Sheet for Healthcare Providers: SeriousBroker.ithttps://www.fda.gov/media/152162/download  This test is not yet approved or cleared by the [place] FDA and has been authorized for detection and/or diagnosis of SARS-CoV-2 by FDA under an Emergency Use Authorization (EUA). This EUA will remain in effect (meaning this test can be used) for the duration of the COVID-19 declaration under Section 564(b)(1) of the Act, 21 U.S.C. section 360bbb-3(b)(1), unless the authorization is terminated or revoked.     Resp Syncytial Virus by PCR NEGATIVE NEGATIVE Final    Comment: (NOTE) Fact Sheet for Patients: BloggerCourse.comhttps://www.fda.gov/media/152166/download  Fact Sheet for Healthcare Providers: SeriousBroker.ithttps://www.fda.gov/media/152162/download  This test is not yet approved or cleared by the [place] FDA and has been authorized for detection and/or diagnosis of SARS-CoV-2 by FDA under an Emergency Use Authorization (EUA). This EUA will remain in effect (meaning this test can be used) for the duration of the COVID-19 declaration under Section 564(b)(1) of the Act, 21 U.S.C. section 360bbb-3(b)(1),  unless the authorization is terminated or revoked.  Performed at [facility], [address] [place], [place] [id number]   Urine Culture (for pregnant, neutropenic or urologic patients or patients with an indwelling urinary catheter)     Status: Abnormal   Collection Time: 04/14/22  4:34 AM   Specimen: Urine, Clean Catch  Result Value Ref Range Status   Specimen Description   Final    URINE, CLEAN CATCH Performed at [facility], [address] [place], [place] [address]    Special Requests   Final    NONE Performed at [facility], [address] [place], [place] [address]    Culture >=100,000 COLONIES/mL STAPHYLOCOCCUS AUREUS (A)  Final   Report Status 04/16/2022 FINAL  Final   Organism  ID, Bacteria STAPHYLOCOCCUS AUREUS (A)  Final      Susceptibility   Staphylococcus aureus - MIC*    CIPROFLOXACIN <=0.5 SENSITIVE Sensitive     GENTAMICIN <=0.5 SENSITIVE Sensitive     NITROFURANTOIN 32 SENSITIVE Sensitive     OXACILLIN 0.5 SENSITIVE Sensitive     TETRACYCLINE <=1 SENSITIVE Sensitive     VANCOMYCIN 1 SENSITIVE Sensitive     TRIMETH/SULFA <=10 SENSITIVE Sensitive     CLINDAMYCIN RESISTANT Resistant     RIFAMPIN <=0.5 SENSITIVE Sensitive     Inducible Clindamycin POSITIVE Resistant     * >=100,000 COLONIES/mL STAPHYLOCOCCUS AUREUS  C Difficile Quick Screen w PCR reflex     Status: None   Collection Time: 04/14/22  6:40 AM   Specimen: STOOL  Result Value Ref Range Status   C Diff antigen NEGATIVE NEGATIVE Final   C Diff toxin NEGATIVE NEGATIVE Final   C Diff interpretation No C. difficile detected.  Final    Comment: Performed at [facility], [address] [place], [place] [address]  Gastrointestinal Panel by PCR , Stool     Status: None   Collection Time: 04/14/22  6:40 AM   Specimen: STOOL  Result Value Ref Range Status   Campylobacter species NOT DETECTED NOT DETECTED Final   Plesimonas  shigelloides NOT DETECTED NOT DETECTED Final   Salmonella species NOT DETECTED NOT DETECTED Final   Yersinia enterocolitica NOT DETECTED NOT DETECTED Final   Vibrio species NOT DETECTED NOT DETECTED Final   Vibrio cholerae NOT DETECTED NOT DETECTED Final   Enteroaggregative E coli (EAEC) NOT DETECTED NOT DETECTED Final   Enteropathogenic E coli (EPEC) NOT DETECTED NOT DETECTED Final   Enterotoxigenic E coli (ETEC) NOT DETECTED NOT DETECTED Final   Shiga like toxin producing E coli (STEC) NOT DETECTED NOT DETECTED Final   Shigella/Enteroinvasive E coli (EIEC) NOT DETECTED NOT DETECTED Final   Cryptosporidium NOT DETECTED NOT DETECTED Final   Cyclospora cayetanensis NOT DETECTED NOT DETECTED Final   Entamoeba histolytica NOT DETECTED NOT DETECTED Final   Giardia lamblia NOT DETECTED NOT DETECTED Final   Adenovirus F40/41 NOT DETECTED NOT DETECTED Final   Astrovirus NOT DETECTED NOT DETECTED Final   Norovirus GI/GII NOT DETECTED NOT DETECTED Final   Rotavirus A NOT DETECTED NOT DETECTED Final   Sapovirus (I, II, IV, and V) NOT DETECTED NOT DETECTED Final    Comment: Performed at [facility], [address] [place], [place] [address]  Culture, blood (Routine X 2) w Reflex to ID Panel     Status: Abnormal   Collection Time: 04/15/22  4:21 PM   Specimen: BLOOD LEFT HAND  Result Value Ref Range Status   Specimen Description   Final    BLOOD LEFT HAND BOTTLES DRAWN AEROBIC AND ANAEROBIC Performed at [facility], [address], [place], [place] [address]    Special Requests   Final    Blood Culture adequate volume Performed at [facility], [address] [place], [place] [address]    Culture  Setup Time   Final    IN BOTH AEROBIC AND ANAEROBIC BOTTLES GRAM POSITIVE COCCI IN CLUSTERS CRITICAL RESULT CALLED TO, READ BACK BY AND VERIFIED WITH:  C/ PHARMD E. WILLIAMSON 04/16/22 1307 A. LAFRANCE Performed at [facility], [address] [place], [place] [address]    Culture STAPHYLOCOCCUS AUREUS (A)  Final   Report Status 04/18/2022 FINAL  Final   Organism ID, Bacteria  STAPHYLOCOCCUS AUREUS  Final      Susceptibility   Staphylococcus aureus - MIC*    CIPROFLOXACIN <=0.5 SENSITIVE Sensitive     ERYTHROMYCIN RESISTANT Resistant     GENTAMICIN <=0.5 SENSITIVE Sensitive     OXACILLIN 0.5 SENSITIVE Sensitive     TETRACYCLINE <=1 SENSITIVE Sensitive     VANCOMYCIN 1 SENSITIVE Sensitive     TRIMETH/SULFA <=10 SENSITIVE Sensitive     CLINDAMYCIN RESISTANT Resistant     RIFAMPIN <=0.5 SENSITIVE Sensitive     Inducible Clindamycin POSITIVE Resistant     * STAPHYLOCOCCUS AUREUS  Blood Culture ID Panel (Reflexed)     Status: Abnormal   Collection Time: 04/15/22  4:21 PM  Result Value Ref Range Status   Enterococcus faecalis NOT DETECTED NOT DETECTED Final   Enterococcus Faecium NOT DETECTED NOT DETECTED Final   Listeria monocytogenes NOT DETECTED NOT DETECTED Final   Staphylococcus species DETECTED (A) NOT DETECTED Final    Comment: CRITICAL RESULT CALLED TO, READ BACK BY AND VERIFIED WITH:  C/ PHARMD E. WILLIAMSON 04/16/22 1307 A. LAFRANCE    Staphylococcus aureus (BCID) DETECTED (A) NOT DETECTED Final    Comment: CRITICAL RESULT CALLED TO, READ BACK BY AND VERIFIED WITH:  C/ PHARMD E. WILLIAMSON 04/16/22 1307 A. LAFRANCE    Staphylococcus epidermidis NOT DETECTED NOT DETECTED Final   Staphylococcus lugdunensis NOT DETECTED NOT DETECTED Final   Streptococcus species NOT DETECTED NOT DETECTED Final   Streptococcus agalactiae NOT DETECTED NOT DETECTED Final   Streptococcus pneumoniae NOT DETECTED NOT DETECTED Final   Streptococcus pyogenes NOT DETECTED NOT DETECTED Final   A.calcoaceticus-baumannii NOT DETECTED NOT DETECTED Final   Bacteroides fragilis NOT DETECTED NOT DETECTED Final   Enterobacterales NOT DETECTED NOT DETECTED Final   Enterobacter cloacae complex NOT DETECTED NOT DETECTED Final   Escherichia coli  NOT DETECTED NOT DETECTED Final   Klebsiella aerogenes NOT DETECTED NOT DETECTED Final   Klebsiella oxytoca NOT DETECTED NOT DETECTED Final   Klebsiella pneumoniae NOT DETECTED NOT DETECTED Final   Proteus species NOT DETECTED NOT DETECTED Final   Salmonella species NOT DETECTED NOT DETECTED Final   Serratia marcescens NOT DETECTED NOT DETECTED Final   Haemophilus influenzae NOT DETECTED NOT DETECTED Final   Neisseria meningitidis NOT DETECTED NOT DETECTED Final   Pseudomonas aeruginosa NOT DETECTED NOT DETECTED Final   Stenotrophomonas maltophilia NOT DETECTED NOT DETECTED Final   Candida albicans NOT DETECTED NOT DETECTED Final   Candida auris NOT DETECTED NOT DETECTED Final   Candida glabrata NOT DETECTED NOT DETECTED Final   Candida krusei NOT DETECTED NOT DETECTED Final   Candida parapsilosis NOT DETECTED NOT DETECTED Final   Candida tropicalis NOT DETECTED NOT DETECTED Final   Cryptococcus neoformans/gattii NOT DETECTED NOT DETECTED Final   Meth resistant mecA/C and MREJ NOT DETECTED NOT DETECTED Final    Comment: Performed at [facility], [address], [place], [place] [address]  Culture, blood (Routine X 2) w Reflex to ID Panel     Status: Abnormal   Collection Time: 04/15/22  4:29 PM   Specimen: BLOOD RIGHT HAND  Result Value Ref Range Status   Specimen Description   Final    BLOOD RIGHT HAND BOTTLES DRAWN AEROBIC ONLY Performed at [facility], [address] [place], [place] [address]    Special Requests   Final    Blood Culture results may not be optimal due to an inadequate volume of blood received in culture bottles Performed at [facility], [address], [place], [place] [address]    Culture  Setup Time   Final    AEROBIC BOTTLE ONLY GRAM POSITIVE COCCI IN CLUSTERS CRITICAL VALUE NOTED.  VALUE IS CONSISTENT WITH PREVIOUSLY REPORTED AND CALLED VALUE.    Culture (A)  Final    STAPHYLOCOCCUS AUREUS SUSCEPTIBILITIES  PERFORMED ON PREVIOUS CULTURE WITHIN THE LAST 5 DAYS. Performed at [facility], [address], [place], [place] [address]    Report Status 04/18/2022 FINAL  Final  Culture, blood (Routine X 2) w Reflex to ID Panel     Status: None (Preliminary result)   Collection Time: 04/17/22  5:47 AM   Specimen: BLOOD  Result Value Ref Range Status   Specimen Description   Final    BLOOD BLOOD LEFT ARM Performed at [facility], [address] [place], [place] [address]    Special Requests   Final    BOTTLES DRAWN AEROBIC AND ANAEROBIC Blood Culture adequate volume Performed at [facility], [address] [place], [place] [address]    Culture   Final    NO GROWTH < 24 HOURS Performed at [facility], [address] [place], [place] [address]    Report Status PENDING  Incomplete  Culture, blood (Routine X 2) w Reflex to ID Panel     Status: None (Preliminary result)   Collection Time: 04/17/22  5:47 AM   Specimen: BLOOD  Result Value Ref Range Status   Specimen Description   Final    BLOOD BLOOD LEFT HAND Performed at [facility], [address], [place], [place] [address]    Special Requests   Final    BOTTLES DRAWN AEROBIC AND ANAEROBIC Blood Culture adequate volume Performed at [facility], [address], [place], [place] [address]    Culture   Final    NO GROWTH < 24 HOURS Performed at [facility], [address] [place], [place] [address]    Report Status PENDING  Incomplete     Radiology Studies: MR Lumbar Spine W Wo Contrast  Result Date: 04/18/2022 CLINICAL DATA:  Initial evaluation for MSSA bacteremia. EXAM: MRI LUMBAR SPINE WITHOUT AND WITH CONTRAST TECHNIQUE: Multiplanar and multiecho pulse sequences of the lumbar spine were obtained without and with intravenous contrast. CONTRAST:  3mL GADAVIST GADOBUTROL 1 MMOL/ML IV SOLN COMPARISON:  Prior radiograph from 01/14/2010.  FINDINGS: Segmentation: Standard. Lowest well-formed disc space labeled the L5-S1 level. Alignment: Physiologic with preservation of the normal lumbar lordosis. No listhesis. Vertebrae: Vertebral body height maintained without acute or chronic fracture. Bone marrow signal intensity somewhat diffusely decreased on T1 weighted imaging, nonspecific, but most commonly related to anemia, smoking, or obesity. Few small benign hemangiomata noted. No worrisome osseous lesions. Reactive marrow edema and enhancement seen about the L4-5 facets bilaterally. Finding is nonspecific, and could be related to degenerative facet arthritis. Possible changes of septic arthritis not excluded. No other evidence for osteomyelitis discitis or septic arthritis elsewhere within the lumbar spine. Conus medullaris and cauda equina: Conus extends to the T12-L1 level. Conus and cauda equina appear normal. No epidural abscess or other collection. Paraspinal and other soft tissues: Paraspinous edema and enhancement adjacent to the L4-5 facets bilaterally. No drainable soft tissue collections. Small volume edema/free fluid noted within the presacral space as well. Disc levels: L1-2: Normal interspace. Mild left greater than right facet hypertrophy. No canal or foraminal stenosis. L2-3: Disc desiccation without  significant disc bulge. Mild bilateral facet hypertrophy. No canal or foraminal stenosis. L3-4: Disc desiccation with mild diffuse disc bulge. Mild facet and ligament flavum hypertrophy. No significant spinal stenosis. Foramina remain patent. L4-5: Disc desiccation with mild disc bulge. Moderate bilateral facet hypertrophy with associated small joint effusions, reactive marrow edema, and enhancement. Resultant mild bilateral subarticular stenosis. Central canal remains patent. Foramina remain patent as well. L5-S1: Negative interspace. Mild bilateral facet hypertrophy. Mild epidural lipomatosis. No canal or foraminal stenosis. IMPRESSION: 1.  Reactive marrow edema and enhancement about the L4-5 facets bilaterally. Given the provided history of bacteremia, changes are concerning for possible acute septic arthritis. Correlation with laboratory values and symptomatology recommended. No abscess or drainable fluid collection. 2. No other evidence for acute infection within the lumbar spine. 3. Mild disc bulge with facet hypertrophy at L4-5 with resultant mild bilateral subarticular stenosis. 4. Additional mild noncompressive disc bulging elsewhere within the lumbar spine without significant stenosis or neural impingement. Electronically Signed   By: Jeannine Boga M.D.   On: 04/18/2022 21:50   MR THORACIC SPINE W WO CONTRAST  Result Date: 04/18/2022 CLINICAL DATA:  Initial evaluation for MSSA bacteremia. EXAM: MRI THORACIC WITHOUT AND WITH CONTRAST TECHNIQUE: Multiplanar and multiecho pulse sequences of the thoracic spine were obtained without and with intravenous contrast. CONTRAST:  26mL GADAVIST GADOBUTROL 1 MMOL/ML IV SOLN COMPARISON:  None Available. FINDINGS: Alignment: Mild levoscoliosis. Alignment otherwise normal preservation of the normal lumbar lordosis. No listhesis. Vertebrae: Vertebral body height maintained without acute or chronic fracture. 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 Diffuse loss of normal bone marrow signal, nonspecific but can be seen with anemia, smoking, obesity, and infiltrative/myelofibrotic marrow processes. No discrete or worrisome osseous lesions. No abnormal marrow edema or enhancement. No evidence for osteomyelitis discitis or septic arthritis. Cord: Normal signal. Subtle flattening of the posterior margin of the midthoracic cord at the level of T7 (series 25, image 11), nonspecific, but could be related to an underlying intradural arachnoid cyst or web. No epidural abscess or other evidence for intracanalicular infection. Paraspinal and other soft tissues: Paraspinous soft tissues demonstrate no acute  finding. Small layering bilateral pleural effusions noted. Disc levels: T1-2: Negative interspace. Mild left-sided facet hypertrophy. No stenosis. T2-3: Segmental fusion of T2 and T3.  No stenosis. T3-4: Mild disc bulge. Mild left-sided facet hypertrophy. No spinal stenosis. Foramina remain patent. T4-5: Mild disc bulge with small right paracentral disc protrusion. Mild posterior element hypertrophy. Minimal flattening of the ventral cord without cord signal changes. Mild prominence of the dorsal epidural fat. No significant spinal stenosis. Foramina remain patent. T5-6: Tiny central disc protrusion minimally indents the ventral thecal sac. Mild facet hypertrophy. Prominence of the dorsal epidural fat. No stenosis. T6-7: Small chronic endplate Schmorl's node deformities without significant disc bulge. Mild posterior element hypertrophy with prominence of the dorsal epidural fat. No stenosis. T7-8: Small left paracentral disc protrusion with annular fissure indents the left ventral thecal sac (series 22, image 25). Flattening of the posterior margin of the cord at this level. No cord signal changes. Mild prominence of the dorsal epidural fat. No significant spinal stenosis. Foramina remain patent. T8-9: Small chronic endplate Schmorl's node deformities. Small left paracentral disc protrusion indents the left ventral thecal sac. Prominence of the dorsal epidural fat. No spinal stenosis. Foramina remain patent. T9-10: Small chronic endplate Schmorl's node deformities without significant disc bulge. Mild facet hypertrophy. No canal or foraminal stenosis. T10-11: Small chronic endplate Schmorl's node deformities without significant disc bulge.  Mild facet hypertrophy. No stenosis. T11-12: Small chronic endplate Schmorl's node deformities without significant disc bulge. No stenosis. T12-L1: Normal interspace. Mild left greater than right facet hypertrophy. No stenosis. IMPRESSION: 1. No MRI evidence for acute infection  within the thoracic spine. 2. Underlying mild multilevel degenerative spondylosis and facet hypertrophy as above. No significant stenosis or neural impingement. 3. Small layering bilateral pleural effusions. Electronically Signed   By: Rise MuBenjamin  McClintock M.D.   On: 04/18/2022 21:37    Scheduled Meds:  amLODipine  10 mg Oral Daily   atorvastatin  20 mg Oral Daily   furosemide  20 mg Oral Daily   insulin aspart  0-5 Units Subcutaneous QHS   insulin aspart  0-9 Units Subcutaneous TID WC   insulin glargine-yfgn  5 Units Subcutaneous Daily   lidocaine  1 patch Transdermal Q24H   Continuous Infusions:   ceFAZolin (ANCEF) IV 2 g (04/19/22 0542)     LOS: 5 days   Burnadette PopAmrit Rhonda Vangieson, MD [facility] P2/09/2022, 8:15 AM

## 2022-04-19 NOTE — Progress Notes (Signed)
[place] for Infectious Disease    Date of Admission:  04/13/2022   Total days of antibiotics 5          ID: Carl [name] is a 57 y.o. male with  MSSA bacteremia Principal Problem:   AKI (acute kidney injury) ([place]) Active Problems:   Hyperlipidemia   Hypertension   Type 2 diabetes mellitus (HCC)   Hyponatremia   UTI (urinary tract infection)   Nausea and vomiting   Diarrhea   Elevated liver enzymes   Acute lower UTI   Staphylococcus aureus bacteremia   Hypoxia   Thrombocytopenia (HCC)    Subjective: Starting to feel better. Underwent mri of lumbar spine that revealed possible early septic arthritis which could explain back pain  Medications:   amLODipine  10 mg Oral Daily   atorvastatin  20 mg Oral Daily   furosemide  20 mg Oral Daily   insulin aspart  0-5 Units Subcutaneous QHS   insulin aspart  0-9 Units Subcutaneous TID WC   insulin glargine-yfgn  5 Units Subcutaneous Daily   lidocaine  1 patch Transdermal Q24H   losartan  50 mg Oral Daily    Objective: Vital signs in last 24 hours: Temp:  [98.2 F (36.8 C)-98.7 F (37.1 C)] 98.2 F (36.8 C) (02/07 1358) Pulse Rate:  [76-80] 80 (02/07 1358) Resp:  [18-19] 19 (02/07 1358) BP: (142-163)/(67-71) 142/71 (02/07 1358) SpO2:  [93 %-95 %] 93 % (02/07 1358)  Physical Exam  Constitutional: He is oriented to person, place, and time. He appears well-developed and well-nourished. No distress.  HENT:  Mouth/Throat: Oropharynx is clear and moist. No oropharyngeal exudate.  Cardiovascular: Normal rate, regular rhythm and normal heart sounds. Exam reveals no gallop and no friction rub.  No murmur heard.  Pulmonary/Chest: Effort normal and breath sounds normal. No respiratory distress. He has no wheezes.  Abdominal: Soft. Bowel sounds are normal. He exhibits no distension. There is no tenderness.  Lymphadenopathy:  He has no cervical adenopathy.  Neurological: He is alert and oriented to person, place, and time.   Skin: Skin is warm and dry. No rash noted. No erythema.  Psychiatric: He has a normal mood and affect. His behavior is normal.    Lab Results Recent Labs    04/17/22 1034 04/18/22 0524 04/19/22 0619  WBC 8.9  --  9.2  HGB 10.8*  --  10.5*  HCT 32.4*  --  32.2*  NA 129* 132* 133*  K 3.5 3.7 3.4*  CL 93* 95* 95*  CO2 26 27 27   BUN 24* 22* 16  CREATININE 1.20 1.09 0.91   Liver Panel Recent Labs    04/18/22 0524 04/19/22 0619  PROT 5.8* 5.8*  ALBUMIN 2.4* 2.2*  AST 51* 42*  ALT 37 31  ALKPHOS 96 113  BILITOT 1.7* 1.0   Sedimentation Rate No results for input(s): "ESRSEDRATE" in the last 72 hours. C-Reactive Protein No results for input(s): "CRP" in the last 72 hours.  Microbiology: 2/5 blood cx NGTD Studies/Results: DG CHEST PORT 1 VIEW  Result Date: 04/19/2022 CLINICAL DATA:  Hypoxia EXAM: PORTABLE CHEST 1 VIEW COMPARISON:  None Available. FINDINGS: Stable large cardiac silhouette. Low lung volumes. No effusion infiltrate or pneumothorax. No acute osseous abnormality. IMPRESSION: Low lung volumes. No acute cardiopulmonary process. Electronically Signed   By: Suzy Bouchard M.D.   On: 04/19/2022 09:57   MR Lumbar Spine W Wo Contrast  Result Date: 04/18/2022 CLINICAL DATA:  Initial evaluation for MSSA  bacteremia. EXAM: MRI LUMBAR SPINE WITHOUT AND WITH CONTRAST TECHNIQUE: Multiplanar and multiecho pulse sequences of the lumbar spine were obtained without and with intravenous contrast. CONTRAST:  9mL GADAVIST GADOBUTROL 1 MMOL/ML IV SOLN COMPARISON:  Prior radiograph from 01/14/2010. FINDINGS: Segmentation: Standard. Lowest well-formed disc space labeled the L5-S1 level. Alignment: Physiologic with preservation of the normal lumbar lordosis. No listhesis. Vertebrae: Vertebral body height maintained without acute or chronic fracture. Bone marrow signal intensity somewhat diffusely decreased on T1 weighted imaging, nonspecific, but most commonly related to anemia, smoking, or  obesity. Few small benign hemangiomata noted. No worrisome osseous lesions. Reactive marrow edema and enhancement seen about the L4-5 facets bilaterally. Finding is nonspecific, and could be related to degenerative facet arthritis. Possible changes of septic arthritis not excluded. No other evidence for osteomyelitis discitis or septic arthritis elsewhere within the lumbar spine. Conus medullaris and cauda equina: Conus extends to the T12-L1 level. Conus and cauda equina appear normal. No epidural abscess or other collection. Paraspinal and other soft tissues: Paraspinous edema and enhancement adjacent to the L4-5 facets bilaterally. No drainable soft tissue collections. Small volume edema/free fluid noted within the presacral space as well. Disc levels: L1-2: Normal interspace. Mild left greater than right facet hypertrophy. No canal or foraminal stenosis. L2-3: Disc desiccation without significant disc bulge. Mild bilateral facet hypertrophy. No canal or foraminal stenosis. L3-4: Disc desiccation with mild diffuse disc bulge. Mild facet and ligament flavum hypertrophy. No significant spinal stenosis. Foramina remain patent. L4-5: Disc desiccation with mild disc bulge. Moderate bilateral facet hypertrophy with associated small joint effusions, reactive marrow edema, and enhancement. Resultant mild bilateral subarticular stenosis. Central canal remains patent. Foramina remain patent as well. L5-S1: Negative interspace. Mild bilateral facet hypertrophy. Mild epidural lipomatosis. No canal or foraminal stenosis. IMPRESSION: 1. Reactive marrow edema and enhancement about the L4-5 facets bilaterally. Given the provided history of bacteremia, changes are concerning for possible acute septic arthritis. Correlation with laboratory values and symptomatology recommended. No abscess or drainable fluid collection. 2. No other evidence for acute infection within the lumbar spine. 3. Mild disc bulge with facet hypertrophy at  L4-5 with resultant mild bilateral subarticular stenosis. 4. Additional mild noncompressive disc bulging elsewhere within the lumbar spine without significant stenosis or neural impingement. Electronically Signed   By: Jeannine Boga M.D.   On: 04/18/2022 21:50   MR THORACIC SPINE W WO CONTRAST  Result Date: 04/18/2022 CLINICAL DATA:  Initial evaluation for MSSA bacteremia. EXAM: MRI THORACIC WITHOUT AND WITH CONTRAST TECHNIQUE: Multiplanar and multiecho pulse sequences of the thoracic spine were obtained without and with intravenous contrast. CONTRAST:  88mL GADAVIST GADOBUTROL 1 MMOL/ML IV SOLN COMPARISON:  None Available. FINDINGS: Alignment: Mild levoscoliosis. Alignment otherwise normal preservation of the normal lumbar lordosis. No listhesis. Vertebrae: Vertebral body height maintained without acute or chronic fracture. 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 Diffuse loss of normal bone marrow signal, nonspecific but can be seen with anemia, smoking, obesity, and infiltrative/myelofibrotic marrow processes. No discrete or worrisome osseous lesions. No abnormal marrow edema or enhancement. No evidence for osteomyelitis discitis or septic arthritis. Cord: Normal signal. Subtle flattening of the posterior margin of the midthoracic cord at the level of T7 (series 25, image 11), nonspecific, but could be related to an underlying intradural arachnoid cyst or web. No epidural abscess or other evidence for intracanalicular infection. Paraspinal and other soft tissues: Paraspinous soft tissues demonstrate no acute finding. Small layering bilateral pleural effusions noted. Disc levels: T1-2: Negative interspace. Mild  left-sided facet hypertrophy. No stenosis. T2-3: Segmental fusion of T2 and T3.  No stenosis. T3-4: Mild disc bulge. Mild left-sided facet hypertrophy. No spinal stenosis. Foramina remain patent. T4-5: Mild disc bulge with small right paracentral disc protrusion. Mild posterior element  hypertrophy. Minimal flattening of the ventral cord without cord signal changes. Mild prominence of the dorsal epidural fat. No significant spinal stenosis. Foramina remain patent. T5-6: Tiny central disc protrusion minimally indents the ventral thecal sac. Mild facet hypertrophy. Prominence of the dorsal epidural fat. No stenosis. T6-7: Small chronic endplate Schmorl's node deformities without significant disc bulge. Mild posterior element hypertrophy with prominence of the dorsal epidural fat. No stenosis. T7-8: Small left paracentral disc protrusion with annular fissure indents the left ventral thecal sac (series 22, image 25). Flattening of the posterior margin of the cord at this level. No cord signal changes. Mild prominence of the dorsal epidural fat. No significant spinal stenosis. Foramina remain patent. T8-9: Small chronic endplate Schmorl's node deformities. Small left paracentral disc protrusion indents the left ventral thecal sac. Prominence of the dorsal epidural fat. No spinal stenosis. Foramina remain patent. T9-10: Small chronic endplate Schmorl's node deformities without significant disc bulge. Mild facet hypertrophy. No canal or foraminal stenosis. T10-11: Small chronic endplate Schmorl's node deformities without significant disc bulge. Mild facet hypertrophy. No stenosis. T11-12: Small chronic endplate Schmorl's node deformities without significant disc bulge. No stenosis. T12-L1: Normal interspace. Mild left greater than right facet hypertrophy. No stenosis. IMPRESSION: 1. No MRI evidence for acute infection within the thoracic spine. 2. Underlying mild multilevel degenerative spondylosis and facet hypertrophy as above. No significant stenosis or neural impingement. 3. Small layering bilateral pleural effusions. Electronically Signed   By: Jeannine Boga M.D.   On: 04/18/2022 21:37     Assessment/Plan: Complicated MSSA bacteremia with possible early L4-L5 septic arthritis = continue  with cefazolin 2gm iv q8hr. Plan to treat for 6 weeks. Still need to get TEE to evaluate for endocarditis.  Right knee abrasion = from fall in hospital, no signs of cellulitis  Hypoxia = still remains on 2L [place]  [facility] for Infectious Diseases Pager: [phone]  04/19/2022, 3:18 PM

## 2022-04-20 ENCOUNTER — Ambulatory Visit (HOSPITAL_COMMUNITY): Admission: RE | Admit: 2022-04-20 | Payer: Self-pay | Source: Home / Self Care | Admitting: Internal Medicine

## 2022-04-20 ENCOUNTER — Inpatient Hospital Stay (HOSPITAL_COMMUNITY): Payer: Self-pay

## 2022-04-20 ENCOUNTER — Inpatient Hospital Stay (HOSPITAL_COMMUNITY): Payer: Self-pay | Admitting: Certified Registered Nurse Anesthetist

## 2022-04-20 ENCOUNTER — Encounter (HOSPITAL_COMMUNITY): Payer: Self-pay | Admitting: Internal Medicine

## 2022-04-20 ENCOUNTER — Encounter (HOSPITAL_COMMUNITY)
Admission: EM | Disposition: A | Discharge status: Home / Self Care | Payer: Self-pay | Source: Home / Self Care | Attending: Family Medicine

## 2022-04-20 ENCOUNTER — Inpatient Hospital Stay: Payer: Self-pay

## 2022-04-20 DIAGNOSIS — Z7984 Long term (current) use of oral hypoglycemic drugs: Secondary | ICD-10-CM

## 2022-04-20 DIAGNOSIS — R7881 Bacteremia: Secondary | ICD-10-CM

## 2022-04-20 DIAGNOSIS — I10 Essential (primary) hypertension: Secondary | ICD-10-CM

## 2022-04-20 DIAGNOSIS — E119 Type 2 diabetes mellitus without complications: Secondary | ICD-10-CM

## 2022-04-20 DIAGNOSIS — Z87891 Personal history of nicotine dependence: Secondary | ICD-10-CM

## 2022-04-20 DIAGNOSIS — I330 Acute and subacute infective endocarditis: Secondary | ICD-10-CM

## 2022-04-20 HISTORY — PX: TEE WITHOUT CARDIOVERSION: SHX5443

## 2022-04-20 HISTORY — PX: BUBBLE STUDY: SHX6837

## 2022-04-20 LAB — BASIC METABOLIC PANEL
Anion gap: 12 (ref 5–15)
BUN: 16 mg/dL (ref 6–20)
CO2: 30 mmol/L (ref 22–32)
Calcium: 7.7 mg/dL — ABNORMAL LOW (ref 8.9–10.3)
Chloride: 93 mmol/L — ABNORMAL LOW (ref 98–111)
Creatinine, Ser: 0.89 mg/dL (ref 0.61–1.24)
GFR, Estimated: >60 mL/min (ref >60)
Glucose, Bld: 217 mg/dL — ABNORMAL HIGH (ref 70–99)
Potassium: 3.5 mmol/L (ref 3.5–5.1)
Sodium: 135 mmol/L (ref 135–145)

## 2022-04-20 LAB — GLUCOSE, CAPILLARY
Glucose-Capillary: 221 mg/dL — ABNORMAL HIGH (ref 70–99)
Glucose-Capillary: 224 mg/dL — ABNORMAL HIGH (ref 70–99)
Glucose-Capillary: 225 mg/dL — ABNORMAL HIGH (ref 70–99)
Glucose-Capillary: 233 mg/dL — ABNORMAL HIGH (ref 70–99)
Glucose-Capillary: 199 mg/dL — ABNORMAL HIGH (ref 70–99)

## 2022-04-20 LAB — ECHO TEE

## 2022-04-20 SURGERY — ECHOCARDIOGRAM, TRANSESOPHAGEAL
Anesthesia: Monitor Anesthesia Care

## 2022-04-20 MED ORDER — LIDOCAINE 2% (20 MG/ML) 5 ML SYRINGE
INTRAMUSCULAR | Status: DC | PRN
  Administered 2022-04-20: 100 mg via INTRAVENOUS

## 2022-04-20 MED ORDER — FUROSEMIDE 10 MG/ML IJ SOLN
20.0000 mg | Freq: Once | INTRAMUSCULAR | Status: AC
  Administered 2022-04-20: 20 mg via INTRAVENOUS
  Filled 2022-04-20: qty 2

## 2022-04-20 MED ORDER — PROPOFOL 500 MG/50ML IV EMUL
INTRAVENOUS | Status: DC | PRN
  Administered 2022-04-20: 150 ug/kg/min via INTRAVENOUS

## 2022-04-20 MED ORDER — PROPOFOL 10 MG/ML IV BOLUS
INTRAVENOUS | Status: DC | PRN
  Administered 2022-04-20: 100 mg via INTRAVENOUS
  Administered 2022-04-20: 40 mg via INTRAVENOUS

## 2022-04-20 MED ORDER — FUROSEMIDE 10 MG/ML IJ SOLN
60.0000 mg | Freq: Two times a day (BID) | INTRAMUSCULAR | Status: DC
  Administered 2022-04-20: 60 mg via INTRAVENOUS
  Filled 2022-04-20: qty 6

## 2022-04-20 MED ORDER — INSULIN GLARGINE-YFGN 100 UNIT/ML ~~LOC~~ SOLN
10.0000 [IU] | Freq: Every day | SUBCUTANEOUS | Status: DC
  Administered 2022-04-20 – 2022-04-22 (×3): 10 [IU] via SUBCUTANEOUS
  Filled 2022-04-20 (×4): qty 0.1

## 2022-04-20 MED ORDER — FUROSEMIDE 10 MG/ML IJ SOLN
40.0000 mg | Freq: Once | INTRAMUSCULAR | Status: AC
  Administered 2022-04-20: 40 mg via INTRAVENOUS
  Filled 2022-04-20: qty 4

## 2022-04-20 NOTE — CV Procedure (Signed)
    TRANSESOPHAGEAL ECHOCARDIOGRAM (TEE) NOTE  INDICATIONS: infective endocarditis  PROCEDURE:   Informed consent was obtained prior to the procedure. The risks, benefits and alternatives for the procedure were discussed and the patient comprehended these risks.  Risks include, but are not limited to, cough, sore throat, vomiting, nausea, somnolence, esophageal and stomach trauma or perforation, bleeding, low blood pressure, aspiration, pneumonia, infection, trauma to the teeth and death.    After a procedural time-out, the patient was given propofol for sedation by anesthesia. See their separate report.  The patient's heart rate, blood pressure, and oxygen saturation are monitored continuously during the procedure.The oropharynx was anesthetized with topical cetacaine.  The transesophageal probe was inserted in the esophagus and stomach without difficulty and multiple views were obtained.  The patient was kept under observation until the patient left the procedure room.  I was present face-to-face 100% of this time. The patient left the procedure room in stable condition.   Agitated microbubble saline contrast was administered.  COMPLICATIONS:    There were no immediate complications.  Findings:  LEFT VENTRICLE: The left ventricular wall thickness is normal.  The left ventricular cavity is normal in size. Wall motion is normal.  LVEF is 55-60%.  RIGHT VENTRICLE:  The right ventricle is normal in structure and function without any thrombus or masses.    LEFT ATRIUM:  The left atrium is mildly dilated in size without any thrombus or masses.  There is not spontaneous echo contrast ("smoke") in the left atrium consistent with a low flow state.  LEFT ATRIAL APPENDAGE:  The left atrial appendage is free of any thrombus or masses. The appendage has single lobes. Pulse doppler indicates high flow in the appendage.  ATRIAL SEPTUM:  The atrial septum appears is aneurysmal. There is no evidence  for interatrial shunting by color doppler and saline microbubble.  RIGHT ATRIUM:  The right atrium is normal in size and function without any thrombus or masses.  MITRAL VALVE:  The mitral valve is normal in structure and function with  trivial  regurgitation.  There were no vegetations or stenosis.  AORTIC VALVE:  The aortic valve is trileaflet, normal in structure and function with  no  regurgitation.  There were no vegetations or stenosis  TRICUSPID VALVE:  The tricuspid valve is normal in structure and function with  trivial  regurgitation.  There were no vegetations or stenosis   PULMONIC VALVE:  The pulmonic valve is normal in structure and function with  no  regurgitation.  There were no vegetations or stenosis.   AORTIC ARCH, ASCENDING AND DESCENDING AORTA:  There was no Ron Parker et. Al, 1992) atherosclerosis of the ascending aorta, aortic arch, or proximal descending aorta.  12. PULMONARY VEINS: Anomalous pulmonary venous return was not noted.  13. PERICARDIUM: The pericardium appeared normal and non-thickened.  There is no pericardial effusion.  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  Time Spent Directly with the Patient:  30 minutes   [name], MD, [facility], [place] Director of the Advanced Lipid Disorders &  Cardiovascular Risk Reduction Clinic Diplomate of the American Board of Clinical Lipidology Attending Cardiologist  Direct Dial: [phone]  Fax: [phone]  Website:  [email or web address] 04/20/2022, 9:49 AM

## 2022-04-20 NOTE — TOC Initial Note (Signed)
Transition of Care [facility]) - Initial/Assessment Note    Patient Details  Name: Carl [name] MRN: [id number] Date of Birth: [birth year]  [facility] CM/SW Contact:    Angelita Ingles, RN Phone [phone]  04/20/2022, 7:39 PM  Clinical Narrative:                 [facility] acknowledges consult for home with IV antibiotics. Patient is being followed by Carolynn Sayers with [facility] for home IV antibiotics. CM received call from [place] aware that Pam will begin teaching with patient and his family today.TOC following.            Patient Goals and CMS Choice            Expected Discharge Plan and Services                                              Prior Living Arrangements/Services                       Activities of Daily Living Home Assistive Devices/Equipment: None ADL Screening (condition at time of admission) Patient's cognitive ability adequate to safely complete daily activities?: Yes Is the patient deaf or have difficulty hearing?: No Does the patient have difficulty seeing, even when wearing glasses/contacts?: No Does the patient have difficulty concentrating, remembering, or making decisions?: No Patient able to express need for assistance with ADLs?: Yes Does the patient have difficulty dressing or bathing?: No Independently performs ADLs?: Yes (appropriate for developmental age) Does the patient have difficulty walking or climbing stairs?: No Weakness of Legs: None Weakness of Arms/Hands: None  Permission Sought/Granted                  Emotional Assessment              Admission diagnosis:  Acute lower UTI [N39.0] AKI (acute kidney injury) ([place]) [N17.9] Nausea vomiting and diarrhea [R11.2, R19.7] Patient Active Problem List   Diagnosis Date Noted   Staphylococcus aureus bacteremia 04/18/2022   Hypoxia 04/18/2022   Thrombocytopenia ([place]) 04/18/2022   Acute lower UTI 04/17/2022   AKI (acute kidney injury)  ([place]) 04/14/2022   Hyponatremia 04/14/2022   UTI (urinary tract infection) 04/14/2022   Nausea and vomiting 04/14/2022   Diarrhea 04/14/2022   Elevated liver enzymes 04/14/2022   Osteomyelitis of second toe of left foot ([place]) 12/26/2020   Acute osteomyelitis of metatarsal bone of left foot ([place]) 12/26/2020   Healthcare maintenance 10/27/2020   Planned postoperative wound closure    Cellulitis and abscess of toe of left foot    Diabetic foot infection ([place])    Abscess of left foot    Osteomyelitis ([place]) 09/21/2020   Type 2 diabetes mellitus ([place]) 01/19/2015   Hyperlipidemia 06/12/2011   Hypertension 06/12/2011   PCP:  Eppie Gibson, MD Pharmacy:   [facility] [address] [place] [address] [place] [address] Phone: [phone] Fax: [phone]     Social Determinants of Health (SDOH) Social History: SDOH Screenings   Food Insecurity: No Food Insecurity (04/17/2022)  Housing: Low Risk  (04/17/2022)  Transportation Needs: No Transportation Needs (04/17/2022)  Utilities: Not At Risk (04/17/2022)  Depression (PHQ2-9): Low Risk  (10/03/2021)  Tobacco Use: Medium Risk (04/20/2022)   SDOH Interventions:     Readmission  Risk Interventions     No data to display

## 2022-04-20 NOTE — Progress Notes (Addendum)
[facility] for Infectious Disease    Date of Admission:  04/13/2022   Total days of antibiotics    ID: Carl [name] is a 57 y.o. male with MSSa bacteremia Principal Problem:   AKI (acute kidney injury) ([place]) Active Problems:   Hyperlipidemia   Hypertension   Type 2 diabetes mellitus ([place])   Hyponatremia   UTI (urinary tract infection)   Nausea and vomiting   Diarrhea   Elevated liver enzymes   Acute lower UTI   Staphylococcus aureus bacteremia   Hypoxia   Thrombocytopenia ([facility])    Subjective: Afebrile underwent TEE which was negative  Medications:   amLODipine  10 mg Oral Daily   atorvastatin  20 mg Oral Daily   furosemide  20 mg Intravenous Once   furosemide  60 mg Intravenous Q12H   insulin aspart  0-5 Units Subcutaneous QHS   insulin aspart  0-9 Units Subcutaneous TID WC   insulin glargine-yfgn  10 Units Subcutaneous Daily   lidocaine  1 patch Transdermal Q24H   losartan  50 mg Oral Daily    Objective: Vital signs in last 24 hours: Temp:  [97.5 F (36.4 C)-98.4 F (36.9 C)] 97.8 F (36.6 C) (02/08 0950) Pulse Rate:  [67-80] 67 (02/08 1008) Resp:  [14-22] 14 (02/08 1008) BP: (120-163)/(63-92) 123/66 (02/08 1008) SpO2:  [92 %-97 %] 95 % (02/08 1008)  Physical Exam  Constitutional:  oriented to person, place, and time. appears well-developed and well-nourished. No distress.  HENT: [place]/AT, PERRLA, no scleral icterus Mouth/Throat: Oropharynx is clear and moist. No oropharyngeal exudate.  Cardiovascular: Normal rate, regular rhythm and normal heart sounds. Exam reveals no gallop and no friction rub.  No murmur heard.  Pulmonary/Chest: Effort normal and breath sounds normal. No respiratory distress.  has no wheezes.  Neck = supple, no nuchal rigidity Abdominal: Soft. Bowel sounds are normal.  exhibits no distension. There is no tenderness.  Lymphadenopathy: no cervical adenopathy. No axillary adenopathy Neurological: alert and oriented to person, place,  and time.  Skin: Skin is warm and dry. No rash noted. No erythema.  Psychiatric: a normal mood and affect.  behavior is normal.     Lab Results Recent Labs    04/19/22 0619 04/20/22 0516  WBC 9.2  --   HGB 10.5*  --   HCT 32.2*  --   NA 133* 135  K 3.4* 3.5  CL 95* 93*  CO2 27 30  BUN 16 16  CREATININE 0.91 0.89   Liver Panel Recent Labs    04/18/22 0524 04/19/22 0619  PROT 5.8* 5.8*  ALBUMIN 2.4* 2.2*  AST 51* 42*  ALT 37 31  ALKPHOS 96 113  BILITOT 1.7* 1.0   Sedimentation Rate No results for input(s): "ESRSEDRATE" in the last 72 hours. C-Reactive Protein No results for input(s): "CRP" in the last 72 hours.  Microbiology:  Studies/Results: [place] EKG SITE RITE  Result Date: 04/20/2022 If Site Rite image not attached, placement could not be confirmed due to current cardiac rhythm.  ECHO TEE  Result Date: 04/20/2022    TRANSESOPHOGEAL ECHO REPORT   Patient Name:   Carl [name] Date of Exam: 04/20/2022 Medical Rec #:  [id number]   Height:       70.0 in Accession #:    [id number]  Weight:       279.0 lb Date of Birth:  [birth year]   BSA:          2.405 m Patient Age:  57 years    BP:           123/66 mmHg Patient Gender: M           HR:           78 bpm. Exam Location:  Inpatient Procedure: Transesophageal Echo, Cardiac Doppler, Color Doppler and Saline            Contrast Bubble Study Indications:    Bacteremia  History:        Patient has prior history of Echocardiogram examinations, most                 recent 04/15/2022. Risk Factors:Hypertension, Diabetes,                 Dyslipidemia and Former Smoker.  Sonographer:    Clayton Lefort RDCS (AE) Referring Phys: [id number] Margie Billet PROCEDURE: After discussion of the risks and benefits of a TEE, an informed consent was obtained from the patient. The transesophogeal probe was passed without difficulty through the esophogus of the patient. Local oropharyngeal anesthetic was provided with Cetacaine. Sedation performed by different  physician. The patient was monitored while under deep sedation. Anesthestetic sedation was provided intravenously by Anesthesiology: 348.70m of Propofol, 1065mof Lidocaine. Image quality was good. The patient developed no complications during the procedure.  IMPRESSIONS  1. Left ventricular ejection fraction, by estimation, is 55 to 60%. The left ventricle has normal function.  2. Right ventricular systolic function is normal. The right ventricular size is normal.  3. Left atrial size was mildly dilated. No left atrial/left atrial appendage thrombus was detected.  4. The mitral valve is grossly normal. Trivial mitral valve regurgitation.  5. The aortic valve is tricuspid. Aortic valve regurgitation is not visualized.  6. Agitated saline contrast bubble study was negative, with no evidence of any interatrial shunt. Conclusion(s)/Recommendation(s): No evidence of vegetation/infective endocarditis on this transesophageael echocardiogram. FINDINGS  Left Ventricle: Left ventricular ejection fraction, by estimation, is 55 to 60%. The left ventricle has normal function. The left ventricular internal cavity size was normal in size. There is no left ventricular hypertrophy. Right Ventricle: The right ventricular size is normal. No increase in right ventricular wall thickness. Right ventricular systolic function is normal. Left Atrium: Left atrial size was mildly dilated. No left atrial/left atrial appendage thrombus was detected. Right Atrium: Right atrial size was normal in size. Pericardium: There is no evidence of pericardial effusion. Mitral Valve: The mitral valve is grossly normal. Trivial mitral valve regurgitation. Tricuspid Valve: The tricuspid valve is grossly normal. Tricuspid valve regurgitation is trivial. Aortic Valve: The aortic valve is tricuspid. Aortic valve regurgitation is not visualized. Pulmonic Valve: The pulmonic valve was grossly normal. Pulmonic valve regurgitation is trivial. Aorta: The aortic  root and ascending aorta are structurally normal, with no evidence of dilitation. IAS/Shunts: The interatrial septum is aneurysmal. No atrial level shunt detected by color flow Doppler. Agitated saline contrast was given intravenously to evaluate for intracardiac shunting. Agitated saline contrast bubble study was negative, with no evidence of any interatrial shunt. KeLyman BishopD Electronically signed by KeLyman BishopD Signature Date/Time: 04/20/2022/10:48:23 AM    Final    DG CHEST PORT 1 VIEW  Result Date: 04/19/2022 CLINICAL DATA:  Hypoxia EXAM: PORTABLE CHEST 1 VIEW COMPARISON:  None Available. FINDINGS: Stable large cardiac silhouette. Low lung volumes. No effusion infiltrate or pneumothorax. No acute osseous abnormality. IMPRESSION: Low lung volumes. No acute cardiopulmonary process. Electronically Signed   By: StSuzy Bouchard  M.D.   On: 04/19/2022 09:57   MR Lumbar Spine W Wo Contrast  Result Date: 04/18/2022 CLINICAL DATA:  Initial evaluation for MSSA bacteremia. EXAM: MRI LUMBAR SPINE WITHOUT AND WITH CONTRAST TECHNIQUE: Multiplanar and multiecho pulse sequences of the lumbar spine were obtained without and with intravenous contrast. CONTRAST:  78m GADAVIST GADOBUTROL 1 MMOL/ML IV SOLN COMPARISON:  Prior radiograph from 01/14/2010. FINDINGS: Segmentation: Standard. Lowest well-formed disc space labeled the L5-S1 level. Alignment: Physiologic with preservation of the normal lumbar lordosis. No listhesis. Vertebrae: Vertebral body height maintained without acute or chronic fracture. Bone marrow signal intensity somewhat diffusely decreased on T1 weighted imaging, nonspecific, but most commonly related to anemia, smoking, or obesity. Few small benign hemangiomata noted. No worrisome osseous lesions. Reactive marrow edema and enhancement seen about the L4-5 facets bilaterally. Finding is nonspecific, and could be related to degenerative facet arthritis. Possible changes of septic arthritis not  excluded. No other evidence for osteomyelitis discitis or septic arthritis elsewhere within the lumbar spine. Conus medullaris and cauda equina: Conus extends to the T12-L1 level. Conus and cauda equina appear normal. No epidural abscess or other collection. Paraspinal and other soft tissues: Paraspinous edema and enhancement adjacent to the L4-5 facets bilaterally. No drainable soft tissue collections. Small volume edema/free fluid noted within the presacral space as well. Disc levels: L1-2: Normal interspace. Mild left greater than right facet hypertrophy. No canal or foraminal stenosis. L2-3: Disc desiccation without significant disc bulge. Mild bilateral facet hypertrophy. No canal or foraminal stenosis. L3-4: Disc desiccation with mild diffuse disc bulge. Mild facet and ligament flavum hypertrophy. No significant spinal stenosis. Foramina remain patent. L4-5: Disc desiccation with mild disc bulge. Moderate bilateral facet hypertrophy with associated small joint effusions, reactive marrow edema, and enhancement. Resultant mild bilateral subarticular stenosis. Central canal remains patent. Foramina remain patent as well. L5-S1: Negative interspace. Mild bilateral facet hypertrophy. Mild epidural lipomatosis. No canal or foraminal stenosis. IMPRESSION: 1. Reactive marrow edema and enhancement about the L4-5 facets bilaterally. Given the provided history of bacteremia, changes are concerning for possible acute septic arthritis. Correlation with laboratory values and symptomatology recommended. No abscess or drainable fluid collection. 2. No other evidence for acute infection within the lumbar spine. 3. Mild disc bulge with facet hypertrophy at L4-5 with resultant mild bilateral subarticular stenosis. 4. Additional mild noncompressive disc bulging elsewhere within the lumbar spine without significant stenosis or neural impingement. Electronically Signed   By: BJeannine BogaM.D.   On: 04/18/2022 21:50   MR  THORACIC SPINE W WO CONTRAST  Result Date: 04/18/2022 CLINICAL DATA:  Initial evaluation for MSSA bacteremia. EXAM: MRI THORACIC WITHOUT AND WITH CONTRAST TECHNIQUE: Multiplanar and multiecho pulse sequences of the thoracic spine were obtained without and with intravenous contrast. CONTRAST:  195mGADAVIST GADOBUTROL 1 MMOL/ML IV SOLN COMPARISON:  None Available. FINDINGS: Alignment: Mild levoscoliosis. Alignment otherwise normal preservation of the normal lumbar lordosis. No listhesis. Vertebrae: Vertebral body height maintained without acute or chronic fracture. 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 Diffuse loss of normal bone marrow signal, nonspecific but can be seen with anemia, smoking, obesity, and infiltrative/myelofibrotic marrow processes. No discrete or worrisome osseous lesions. No abnormal marrow edema or enhancement. No evidence for osteomyelitis discitis or septic arthritis. Cord: Normal signal. Subtle flattening of the posterior margin of the midthoracic cord at the level of T7 (series 25, image 11), nonspecific, but could be related to an underlying intradural arachnoid cyst or web. No epidural abscess or other evidence for intracanalicular  infection. Paraspinal and other soft tissues: Paraspinous soft tissues demonstrate no acute finding. Small layering bilateral pleural effusions noted. Disc levels: T1-2: Negative interspace. Mild left-sided facet hypertrophy. No stenosis. T2-3: Segmental fusion of T2 and T3.  No stenosis. T3-4: Mild disc bulge. Mild left-sided facet hypertrophy. No spinal stenosis. Foramina remain patent. T4-5: Mild disc bulge with small right paracentral disc protrusion. Mild posterior element hypertrophy. Minimal flattening of the ventral cord without cord signal changes. Mild prominence of the dorsal epidural fat. No significant spinal stenosis. Foramina remain patent. T5-6: Tiny central disc protrusion minimally indents the ventral thecal sac. Mild facet  hypertrophy. Prominence of the dorsal epidural fat. No stenosis. T6-7: Small chronic endplate Schmorl's node deformities without significant disc bulge. Mild posterior element hypertrophy with prominence of the dorsal epidural fat. No stenosis. T7-8: Small left paracentral disc protrusion with annular fissure indents the left ventral thecal sac (series 22, image 25). Flattening of the posterior margin of the cord at this level. No cord signal changes. Mild prominence of the dorsal epidural fat. No significant spinal stenosis. Foramina remain patent. T8-9: Small chronic endplate Schmorl's node deformities. Small left paracentral disc protrusion indents the left ventral thecal sac. Prominence of the dorsal epidural fat. No spinal stenosis. Foramina remain patent. T9-10: Small chronic endplate Schmorl's node deformities without significant disc bulge. Mild facet hypertrophy. No canal or foraminal stenosis. T10-11: Small chronic endplate Schmorl's node deformities without significant disc bulge. Mild facet hypertrophy. No stenosis. T11-12: Small chronic endplate Schmorl's node deformities without significant disc bulge. No stenosis. T12-L1: Normal interspace. Mild left greater than right facet hypertrophy. No stenosis. IMPRESSION: 1. No MRI evidence for acute infection within the thoracic spine. 2. Underlying mild multilevel degenerative spondylosis and facet hypertrophy as above. No significant stenosis or neural impingement. 3. Small layering bilateral pleural effusions. Electronically Signed   By: Jeannine Boga M.D.   On: 04/18/2022 21:37     Assessment/Plan: MSSA bacteremia with probable early lumbar septic arthritis = plan for 6 wk of cefazolin 2gm IV Q 8hr. TEE ruled out endocarditis.  Will plan for picc line placement today, with opat orders and home health teaching coordination  We will see back in the ID clinic in 4-6 wk  times ----------------------------------------------------------------------- Diagnosis: Bacteremia and lumbar septic arthritis  Culture Result: MSSA  Allergies  Allergen Reactions   Morphine And Related Other (See Comments)    hallucinations    OPAT Orders Discharge antibiotics to be given via PICC line Discharge antibiotics: Per pharmacy protocol cefazolin 2gm Q8 hrs  Duration: 6 wk End Date: 05/28/22  [facility] Care Per Protocol:  Home health RN for IV administration and teaching; PICC line care and labs.    Labs weekly while on IV antibiotics: _x_ CBC with differential __x BMP  _x_ CRP __x ESR   _x_ Please pull PIC at completion of IV antibiotics   Fax weekly labs to [phone]  Clinic Follow Up Appt: 4-5 wk  @ [facility] with Dr Enslee Bibbins   [facility] Pager: [phone]  04/20/2022, 1:18 PM

## 2022-04-20 NOTE — Progress Notes (Signed)
PHARMACY CONSULT NOTE FOR:  OUTPATIENT  PARENTERAL ANTIBIOTIC THERAPY (OPAT)  Indication: MSSA bacteremia / lumbar discitis Regimen: cefazolin 2g IV q8h End date: 05/28/2022  IV antibiotic discharge orders are pended. To discharging provider:  please sign these orders via discharge navigator,  Select New Orders & click on the button choice - Manage This Unsigned Work.     Thank you for allowing pharmacy to be a part of this patient's care.  Candie Mile 04/20/2022, 10:57 AM

## 2022-04-20 NOTE — Progress Notes (Signed)
PT Cancellation Note  Patient Details Name: Carl [name] MRN: [id number] DOB: April 24, 1965   Cancelled Treatment:    Reason Eval/Treat Not Completed: Patient declined, no reason specified declined PT, has been walking around and up on his own. Will check back in a bit if time/schedule allow.   Deniece Ree PT DPT PN2

## 2022-04-20 NOTE — Transfer of Care (Signed)
Immediate Anesthesia Transfer of Care Note  Patient: Carl [name]  Procedure(s) Performed: TRANSESOPHAGEAL ECHOCARDIOGRAM (TEE) BUBBLE STUDY  Patient Location: PACU and Endoscopy Unit  Anesthesia Type:MAC  Level of Consciousness: drowsy and patient cooperative  Airway & Oxygen Therapy: Patient Spontanous Breathing and Patient connected to nasal cannula oxygen  Post-op Assessment: Report given to RN and Post -op Vital signs reviewed and stable  Post vital signs: Reviewed and stable  Last Vitals:  Vitals Value Taken Time  BP    Temp    Pulse    Resp    SpO2      Last Pain:  Vitals:   04/20/22 0914  TempSrc: Temporal  PainSc: 0-No pain      Patients Stated Pain Goal: 0 (15/17/61 6073)  Complications: No notable events documented.

## 2022-04-20 NOTE — Progress Notes (Signed)
PROGRESS NOTE  Carl [name]  WGN:[id number] DOB: [birth year] DOA: 04/13/2022 PCP: Alicia Amel, MD   Brief Narrative: Patient is a 57 year old male with history of diabetes type 2 who presented with 6-day history of nausea, vomiting, diarrhea.  Lab work showed AKI, hyponatremia, elevated liver enzymes.  Hospital course remarkable for finding of MSSA bacteremia, UTI.  ID consulted and following.  Lumbar MRI showed active septic arthritis at L4-L5 with no fluid collection or abscess.  TEE did not show any vegetation, plan for PICC line placement today with possible discharge to home tomorrow.  Assessment & Plan:  Principal Problem:   AKI (acute kidney injury) (HCC) Active Problems:   Hyperlipidemia   Hypertension   Type 2 diabetes mellitus (HCC)   Hyponatremia   UTI (urinary tract infection)   Nausea and vomiting   Diarrhea   Elevated liver enzymes   Acute lower UTI   Staphylococcus aureus bacteremia   Hypoxia   Thrombocytopenia (HCC)  AKI: Resolved likely from dehydration in the setting of vomiting and diarrhea.  Currently kidney function stable.  IV fluids stopped  MSSA bacteremia/MSSA UTI: Febrile during this hospitalization.  Chest x-ray did not show any acute findings.  No abdominal pain.  No presence of hardware.  Lumbar spine MRI showed acute septic arthritis at L4-L5 without any fluid collection or abscess.  TEE negative for vegetation. Plan for 6 weeks of antibiotics course, undergoing PICC line placement  Elevated liver enzymes: No history of heavy alcohol use.  Right upper quadrant ultrasound showed fatty liver. Hepatitis panel negative.  Liver enzymes improved.  Monitor as an outpatient  Hyponatremia: Improved.  Acute hypoxic respiratory failure:  Chest Xray  negative findings.  Echo without significant findings, showed normal left ventricle ejection fraction, grade 1 diastolic dysfunction.  D-dimer was positive but lower extremity Doppler, VQ scan negative.  Obesity  hypoventilation syndrome is a possibility.  Chest x-ray on 2/7 showed  low lung volumes.  Patient looks volume overloaded today with elevated BNP.  Will start on IV diuresis.  Wean oxygen as tolerated.  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  Hemoglobin A1c of 7.8.  On glipizide, metformin at home.  Continue current insulin regimen while at hospital.  Normocytic anemia/thrombocytopenia: Currently hemoglobin/platelet count stable.  Thrombocytopenia could be from sepsis/bacteremia.  Continue to monitor  Hypertension: On Norvasc.  Home losartan on hold,resumed  Hypokalemia: Supplemented with potassium,will be monitored  Hyperlipidemia: On lipitor  Nausea, vomiting, diarrhea: Present on admission.  Resolved .C. difficile, GI pathogen panel negative.  Deconditioning/weakness: PT saw the patient, no follow-up recommended  Morbid obesity: BMI of  40        DVT prophylaxis:Place and maintain sequential compression device Start: 04/16/22 0816     Code Status: Full Code  Family Communication: None at bedside  Patient status:Inpatient  Patient is from :Home  Anticipated discharge HQ:[facility]  Estimated DC date: PICC line placement pending, on IV Lasix.  Discharge tomorrow   Consultants: ID  Procedures:MRI, TEE  Antimicrobials:  Anti-infectives (From admission, onward)    Start     Dose/Rate Route Frequency Ordered Stop   04/16/22 1800  vancomycin (VANCOREADY) IVPB 1500 mg/300 mL  Status:  Discontinued        1,500 mg 150 mL/hr over 120 Minutes Intravenous Every 24 hours 04/15/22 1548 04/16/22 1148   04/16/22 1800  vancomycin (VANCOCIN) IVPB 1000 mg/200 mL premix  Status:  Discontinued  1,000 mg 200 mL/hr over 60 Minutes Intravenous Every 12 hours 04/16/22 1148 04/16/22 1314   04/16/22 1400  ceFAZolin (ANCEF) IVPB 2g/100 mL premix        2 g 200 mL/hr over 30 Minutes Intravenous Every 8 hours 04/16/22 1314     04/15/22  1830  vancomycin (VANCOREADY) IVPB 500 mg/100 mL       See Hyperspace for full Linked Orders Report.   500 mg 100 mL/hr over 60 Minutes Intravenous  Once 04/15/22 1531 04/15/22 2005   04/15/22 1630  vancomycin (VANCOREADY) IVPB 2000 mg/400 mL       See Hyperspace for full Linked Orders Report.   2,000 mg 200 mL/hr over 120 Minutes Intravenous  Once 04/15/22 1531 04/15/22 1858   04/15/22 0500  cefTRIAXone (ROCEPHIN) 1 g in sodium chloride 0.9 % 100 mL IVPB  Status:  Discontinued        1 g 200 mL/hr over 30 Minutes Intravenous Every 24 hours 04/14/22 0515 04/14/22 0859   04/14/22 0500  cefTRIAXone (ROCEPHIN) 1 g in sodium chloride 0.9 % 100 mL IVPB        1 g 200 mL/hr over 30 Minutes Intravenous  Once 04/14/22 0456 04/14/22 0700       Subjective: Patient seen and examined at bedside today.  Just came from TEE.  Denies any worsening shortness of breath or cough.  On 1 L of oxygen per minute.  Looks slightly overloaded today with lower extremity edema.   Objective: Vitals:   04/20/22 0950 04/20/22 0958 04/20/22 1000 04/20/22 1008  BP: 120/72 121/71 121/71 123/66  Pulse: 74 70 74 67  Resp: (!) 22 (!) 22 20 14   Temp: 97.8 F (36.6 C)     TempSrc: Temporal     SpO2: 96% 95% 97% 95%  Weight:      Height:        Intake/Output Summary (Last 24 hours) at 04/20/2022 1200 Last data filed at 04/20/2022 0948 Gross per 24 hour  Intake 1178.78 ml  Output 900 ml  Net 278.78 ml   Filed Weights   04/15/22 0623  Weight: 126.6 kg    Examination:   General exam: Overall comfortable, not in distress,obese HEENT: PERRL Respiratory system: Bibasilar crackles Cardiovascular system: S1 & S2 heard, RRR.  Gastrointestinal system: Abdomen is nondistended, soft and nontender. Central nervous system: Alert and oriented Extremities: LE edema, no clubbing ,no cyanosis Skin: No rashes, no ulcers,no icterus     Data Reviewed: I have personally reviewed following labs and imaging  studies  CBC: Recent Labs  Lab 04/14/22 0629 04/15/22 0606 04/16/22 0626 04/17/22 1034 04/19/22 0619  WBC 10.3 7.2 8.0 8.9 9.2  HGB 10.8* 9.7* 10.0* 10.8* 10.5*  HCT 32.1* 29.4* 31.1* 32.4* 32.2*  MCV 83.6 84.7 86.9 85.0 87.3  PLT 143* 125* 99* 120* 701   Basic Metabolic Panel: Recent Labs  Lab 04/16/22 0626 04/17/22 1034 04/18/22 0524 04/19/22 0619 04/20/22 0516  NA 130* 129* 132* 133* 135  K 3.6 3.5 3.7 3.4* 3.5  CL 97* 93* 95* 95* 93*  CO2 21* 26 27 27 30   GLUCOSE 165* 270* 226* 235* 217*  BUN 29* 24* 22* 16 16  CREATININE 1.13 1.20 1.09 0.91 0.89  CALCIUM 7.7* 7.6* 7.7* 7.6* 7.7*     Recent Results (from the past 240 hour(s))  Resp panel by RT-PCR (RSV, Flu A&B, Covid) Anterior Nasal Swab     Status: None   Collection Time: 04/13/22 10:26 PM  Specimen: Anterior Nasal Swab  Result Value Ref Range Status   SARS Coronavirus 2 by RT PCR NEGATIVE NEGATIVE Final    Comment: (NOTE) SARS-CoV-2 target nucleic acids are NOT DETECTED.  The SARS-CoV-2 RNA is generally detectable in upper respiratory specimens during the acute phase of infection. The lowest concentration of SARS-CoV-2 viral copies this assay can detect is 138 copies/mL. A negative result does not preclude SARS-Cov-2 infection and should not be used as the sole basis for treatment or other patient management decisions. A negative result may occur with  improper specimen collection/handling, submission of specimen other than nasopharyngeal swab, presence of viral mutation(s) within the areas targeted by this assay, and inadequate number of viral copies(<138 copies/mL). A negative result must be combined with clinical observations, patient history, and epidemiological information. The expected result is Negative.  Fact Sheet for Patients:  EntrepreneurPulse.com.au  Fact Sheet for Healthcare Providers:  IncredibleEmployment.be  This test is no t yet approved or  cleared by the [place] FDA and  has been authorized for detection and/or diagnosis of SARS-CoV-2 by FDA under an Emergency Use Authorization (EUA). This EUA will remain  in effect (meaning this test can be used) for the duration of the COVID-19 declaration under Section 564(b)(1) of the Act, 21 U.S.C.section 360bbb-3(b)(1), unless the authorization is terminated  or revoked sooner.       Influenza A by PCR NEGATIVE NEGATIVE Final   Influenza B by PCR NEGATIVE NEGATIVE Final    Comment: (NOTE) The Xpert Xpress SARS-CoV-2/FLU/RSV plus assay is intended as an aid in the diagnosis of influenza from Nasopharyngeal swab specimens and should not be used as a sole basis for treatment. Nasal washings and aspirates are unacceptable for Xpert Xpress SARS-CoV-2/FLU/RSV testing.  Fact Sheet for Patients: EntrepreneurPulse.com.au  Fact Sheet for Healthcare Providers: IncredibleEmployment.be  This test is not yet approved or cleared by the [place] FDA and has been authorized for detection and/or diagnosis of SARS-CoV-2 by FDA under an Emergency Use Authorization (EUA). This EUA will remain in effect (meaning this test can be used) for the duration of the COVID-19 declaration under Section 564(b)(1) of the Act, 21 U.S.C. section 360bbb-3(b)(1), unless the authorization is terminated or revoked.     Resp Syncytial Virus by PCR NEGATIVE NEGATIVE Final    Comment: (NOTE) Fact Sheet for Patients: EntrepreneurPulse.com.au  Fact Sheet for Healthcare Providers: IncredibleEmployment.be  This test is not yet approved or cleared by the [place] FDA and has been authorized for detection and/or diagnosis of SARS-CoV-2 by FDA under an Emergency Use Authorization (EUA). This EUA will remain in effect (meaning this test can be used) for the duration of the COVID-19 declaration under Section 564(b)(1) of the Act, 21  U.S.C. section 360bbb-3(b)(1), unless the authorization is terminated or revoked.  Performed at [facility], [place] [address] [place], [place] [address]   Urine Culture (for pregnant, neutropenic or urologic patients or patients with an indwelling urinary catheter)     Status: Abnormal   Collection Time: 04/14/22  4:34 AM   Specimen: Urine, Clean Catch  Result Value Ref Range Status   Specimen Description   Final    URINE, CLEAN CATCH Performed at [facility], [place] [address] [place], [place] [address]    Special Requests   Final    NONE Performed at [facility], [place] [address], [place],  [address]    Culture >=100,000 COLONIES/mL STAPHYLOCOCCUS AUREUS (A)  Final   Report  Status 04/16/2022 FINAL  Final   Organism ID, Bacteria STAPHYLOCOCCUS AUREUS (A)  Final      Susceptibility   Staphylococcus aureus - MIC*    CIPROFLOXACIN <=0.5 SENSITIVE Sensitive     GENTAMICIN <=0.5 SENSITIVE Sensitive     NITROFURANTOIN 32 SENSITIVE Sensitive     OXACILLIN 0.5 SENSITIVE Sensitive     TETRACYCLINE <=1 SENSITIVE Sensitive     VANCOMYCIN 1 SENSITIVE Sensitive     TRIMETH/SULFA <=10 SENSITIVE Sensitive     CLINDAMYCIN RESISTANT Resistant     RIFAMPIN <=0.5 SENSITIVE Sensitive     Inducible Clindamycin POSITIVE Resistant     * >=100,000 COLONIES/mL STAPHYLOCOCCUS AUREUS  C Difficile Quick Screen w PCR reflex     Status: None   Collection Time: 04/14/22  6:40 AM   Specimen: STOOL  Result Value Ref Range Status   C Diff antigen NEGATIVE NEGATIVE Final   C Diff toxin NEGATIVE NEGATIVE Final   C Diff interpretation No C. difficile detected.  Final    Comment: Performed at [facility], [address], [place], [place] [address]  Gastrointestinal Panel by PCR , Stool     Status: None   Collection Time: 04/14/22  6:40 AM   Specimen: STOOL  Result Value Ref Range Status   Campylobacter species NOT DETECTED  NOT DETECTED Final   Plesimonas shigelloides NOT DETECTED NOT DETECTED Final   Salmonella species NOT DETECTED NOT DETECTED Final   Yersinia enterocolitica NOT DETECTED NOT DETECTED Final   Vibrio species NOT DETECTED NOT DETECTED Final   Vibrio cholerae NOT DETECTED NOT DETECTED Final   Enteroaggregative E coli (EAEC) NOT DETECTED NOT DETECTED Final   Enteropathogenic E coli (EPEC) NOT DETECTED NOT DETECTED Final   Enterotoxigenic E coli (ETEC) NOT DETECTED NOT DETECTED Final   Shiga like toxin producing E coli (STEC) NOT DETECTED NOT DETECTED Final   Shigella/Enteroinvasive E coli (EIEC) NOT DETECTED NOT DETECTED Final   Cryptosporidium NOT DETECTED NOT DETECTED Final   Cyclospora cayetanensis NOT DETECTED NOT DETECTED Final   Entamoeba histolytica NOT DETECTED NOT DETECTED Final   Giardia lamblia NOT DETECTED NOT DETECTED Final   Adenovirus F40/41 NOT DETECTED NOT DETECTED Final   Astrovirus NOT DETECTED NOT DETECTED Final   Norovirus GI/GII NOT DETECTED NOT DETECTED Final   Rotavirus A NOT DETECTED NOT DETECTED Final   Sapovirus (I, II, IV, and V) NOT DETECTED NOT DETECTED Final    Comment: Performed at [facility], [address] [place], [place] [address]  Culture, blood (Routine X 2) w Reflex to ID Panel     Status: Abnormal   Collection Time: 04/15/22  4:21 PM   Specimen: BLOOD LEFT HAND  Result Value Ref Range Status   Specimen Description   Final    BLOOD LEFT HAND BOTTLES DRAWN AEROBIC AND ANAEROBIC Performed at [facility], [address] [place], [place] [address]    Special Requests   Final    Blood Culture adequate volume Performed at [facility], [address] [place], [place] [address]    Culture  Setup Time   Final    IN BOTH AEROBIC AND ANAEROBIC BOTTLES GRAM POSITIVE COCCI IN CLUSTERS CRITICAL RESULT CALLED TO, READ BACK BY AND VERIFIED WITH:  C/ PHARMD E. WILLIAMSON 04/16/22 1307 A. LAFRANCE Performed at  [facility], [address], [place], [place] [address]    Culture STAPHYLOCOCCUS AUREUS (A)  Final   Report Status 04/18/2022  FINAL  Final   Organism ID, Bacteria STAPHYLOCOCCUS AUREUS  Final      Susceptibility   Staphylococcus aureus - MIC*    CIPROFLOXACIN <=0.5 SENSITIVE Sensitive     ERYTHROMYCIN RESISTANT Resistant     GENTAMICIN <=0.5 SENSITIVE Sensitive     OXACILLIN 0.5 SENSITIVE Sensitive     TETRACYCLINE <=1 SENSITIVE Sensitive     VANCOMYCIN 1 SENSITIVE Sensitive     TRIMETH/SULFA <=10 SENSITIVE Sensitive     CLINDAMYCIN RESISTANT Resistant     RIFAMPIN <=0.5 SENSITIVE Sensitive     Inducible Clindamycin POSITIVE Resistant     * STAPHYLOCOCCUS AUREUS  Blood Culture ID Panel (Reflexed)     Status: Abnormal   Collection Time: 04/15/22  4:21 PM  Result Value Ref Range Status   Enterococcus faecalis NOT DETECTED NOT DETECTED Final   Enterococcus Faecium NOT DETECTED NOT DETECTED Final   Listeria monocytogenes NOT DETECTED NOT DETECTED Final   Staphylococcus species DETECTED (A) NOT DETECTED Final    Comment: CRITICAL RESULT CALLED TO, READ BACK BY AND VERIFIED WITH:  C/ PHARMD E. WILLIAMSON 04/16/22 1307 A. LAFRANCE    Staphylococcus aureus (BCID) DETECTED (A) NOT DETECTED Final    Comment: CRITICAL RESULT CALLED TO, READ BACK BY AND VERIFIED WITH:  C/ PHARMD E. WILLIAMSON 04/16/22 1307 A. LAFRANCE    Staphylococcus epidermidis NOT DETECTED NOT DETECTED Final   Staphylococcus lugdunensis NOT DETECTED NOT DETECTED Final   Streptococcus species NOT DETECTED NOT DETECTED Final   Streptococcus agalactiae NOT DETECTED NOT DETECTED Final   Streptococcus pneumoniae NOT DETECTED NOT DETECTED Final   Streptococcus pyogenes NOT DETECTED NOT DETECTED Final   A.calcoaceticus-baumannii NOT DETECTED NOT DETECTED Final   Bacteroides fragilis NOT DETECTED NOT DETECTED Final   Enterobacterales NOT DETECTED NOT DETECTED Final   Enterobacter cloacae complex NOT DETECTED NOT  DETECTED Final   Escherichia coli NOT DETECTED NOT DETECTED Final   Klebsiella aerogenes NOT DETECTED NOT DETECTED Final   Klebsiella oxytoca NOT DETECTED NOT DETECTED Final   Klebsiella pneumoniae NOT DETECTED NOT DETECTED Final   Proteus species NOT DETECTED NOT DETECTED Final   Salmonella species NOT DETECTED NOT DETECTED Final   Serratia marcescens NOT DETECTED NOT DETECTED Final   Haemophilus influenzae NOT DETECTED NOT DETECTED Final   Neisseria meningitidis NOT DETECTED NOT DETECTED Final   Pseudomonas aeruginosa NOT DETECTED NOT DETECTED Final   Stenotrophomonas maltophilia NOT DETECTED NOT DETECTED Final   Candida albicans NOT DETECTED NOT DETECTED Final   Candida auris NOT DETECTED NOT DETECTED Final   Candida glabrata NOT DETECTED NOT DETECTED Final   Candida krusei NOT DETECTED NOT DETECTED Final   Candida parapsilosis NOT DETECTED NOT DETECTED Final   Candida tropicalis NOT DETECTED NOT DETECTED Final   Cryptococcus neoformans/gattii NOT DETECTED NOT DETECTED Final   Meth resistant mecA/C and MREJ NOT DETECTED NOT DETECTED Final    Comment: Performed at [facility], [address], [place], [place] [address]  Culture, blood (Routine X 2) w Reflex to ID Panel     Status: Abnormal   Collection Time: 04/15/22  4:29 PM   Specimen: BLOOD RIGHT HAND  Result Value Ref Range Status   Specimen Description   Final    BLOOD RIGHT HAND BOTTLES DRAWN AEROBIC ONLY Performed at [place] [address], [place], [place] [address]    Special Requests   Final    Blood Culture results may not be optimal due to an inadequate volume of  blood received in culture bottles Performed at [facility], [address] [place], [place] [address]    Culture  Setup Time   Final    AEROBIC BOTTLE ONLY GRAM POSITIVE COCCI IN CLUSTERS CRITICAL VALUE NOTED.  VALUE IS CONSISTENT WITH PREVIOUSLY REPORTED AND CALLED VALUE.    Culture (A)  Final     STAPHYLOCOCCUS AUREUS SUSCEPTIBILITIES PERFORMED ON PREVIOUS CULTURE WITHIN THE LAST 5 DAYS. Performed at [facility], [address] [place], [place] [address]    Report Status 04/18/2022 FINAL  Final  Culture, blood (Routine X 2) w Reflex to ID Panel     Status: None (Preliminary result)   Collection Time: 04/17/22  5:47 AM   Specimen: BLOOD  Result Value Ref Range Status   Specimen Description   Final    BLOOD BLOOD LEFT ARM Performed at [facility], [address] [place], [place] [address]    Special Requests   Final    BOTTLES DRAWN AEROBIC AND ANAEROBIC Blood Culture adequate volume Performed at [facility], [address] [place], [place] [address]    Culture   Final    NO GROWTH 3 DAYS Performed at [facility], [address] [place], [place] [address]    Report Status PENDING  Incomplete  Culture, blood (Routine X 2) w Reflex to ID Panel     Status: None (Preliminary result)   Collection Time: 04/17/22  5:47 AM   Specimen: BLOOD  Result Value Ref Range Status   Specimen Description   Final    BLOOD BLOOD LEFT HAND Performed at [facility], [address] [place], [place] [address]    Special Requests   Final    BOTTLES DRAWN AEROBIC AND ANAEROBIC Blood Culture adequate volume Performed at [facility], [address] [place], [place] [address]    Culture   Final    NO GROWTH 3 DAYS Performed at [facility], [address] [place], [place] [address]    Report Status PENDING  Incomplete     Radiology Studies: US EKG SITE RITE  Result Date: 04/20/2022 If Site Rite image not attached, placement could not be confirmed due to current cardiac rhythm.  ECHO TEE  Result Date: 04/20/2022    TRANSESOPHOGEAL ECHO REPORT   Patient Name:   Carl LoganGARY W [name] Date of Exam: 04/20/2022 Medical Rec #:  [id number]   Height:       70.0 in Accession #:    [id number]  Weight:       279.0  lb Date of Birth:  [birth year]   BSA:          2.405 m Patient Age:    57 years    BP:           123/66 mmHg Patient Gender: M           HR:           78 bpm. Exam Location:  Inpatient Procedure: Transesophageal Echo, Cardiac Doppler, Color Doppler and Saline            Contrast Bubble Study Indications:    Bacteremia  History:        Patient has prior history of Echocardiogram examinations, most                 recent 04/15/2022. Risk Factors:Hypertension, Diabetes,  Dyslipidemia and Former Smoker.  Sonographer:    Arthur Guy RDCS (AE) Referring Phys: [id number] KATHLEEN R JOHNSON PROCEDURE: After discussion of the risks and benefits of a TEE, an informed consent was obtained from the patient. The transesophogeal probe was passed without difficulty through the esophogus of the patient. Local oropharyngeal anesthetic was provided with Cetacaine. Sedation performed by different physician. The patient was monitored while under deep sedation. Anesthestetic sedation was provided intravenously by Anesthesiology: 348.89mg  of Propofol, 100mg  of Lidocaine. Image quality was good. The patient developed no complications during the procedure.  IMPRESSIONS  1. Left ventricular ejection fraction, by estimation, is 55 to 60%. The left ventricle has normal function.  2. Right ventricular systolic function is normal. The right ventricular size is normal.  3. Left atrial size was mildly dilated. No left atrial/left atrial appendage thrombus was detected.  4. The mitral valve is grossly normal. Trivial mitral valve regurgitation.  5. The aortic valve is tricuspid. Aortic valve regurgitation is not visualized.  6. Agitated saline contrast bubble study was negative, with no evidence of any interatrial shunt. Conclusion(s)/Recommendation(s): No evidence of vegetation/infective endocarditis on this transesophageael echocardiogram. FINDINGS  Left Ventricle: Left ventricular ejection fraction, by estimation, is 55 to 60%. The left  ventricle has normal function. The left ventricular internal cavity size was normal in size. There is no left ventricular hypertrophy. Right Ventricle: The right ventricular size is normal. No increase in right ventricular wall thickness. Right ventricular systolic function is normal. Left Atrium: Left atrial size was mildly dilated. No left atrial/left atrial appendage thrombus was detected. Right Atrium: Right atrial size was normal in size. Pericardium: There is no evidence of pericardial effusion. Mitral Valve: The mitral valve is grossly normal. Trivial mitral valve regurgitation. Tricuspid Valve: The tricuspid valve is grossly normal. Tricuspid valve regurgitation is trivial. Aortic Valve: The aortic valve is tricuspid. Aortic valve regurgitation is not visualized. Pulmonic Valve: The pulmonic valve was grossly normal. Pulmonic valve regurgitation is trivial. Aorta: The aortic root and ascending aorta are structurally normal, with no evidence of dilitation. IAS/Shunts: The interatrial septum is aneurysmal. No atrial level shunt detected by color flow Doppler. Agitated saline contrast was given intravenously to evaluate for intracardiac shunting. Agitated saline contrast bubble study was negative, with no evidence of any interatrial shunt. Kenneth Hilty MD Electronically signed by Kenneth Hilty MD Signature Date/Time: 04/20/2022/10:48:23 AM    Final    DG CHEST PORT 1 VIEW  Result Date: 04/19/2022 CLINICAL DATA:  Hypoxia EXAM: PORTABLE CHEST 1 VIEW COMPARISON:  None Available. FINDINGS: Stable large cardiac silhouette. Low lung volumes. No effusion infiltrate or pneumothorax. No acute osseous abnormality. IMPRESSION: Low lung volumes. No acute cardiopulmonary process. Electronically Signed   By: Stewart  Edmunds M.D.   On: 04/19/2022 09:57   MR Lumbar Spine W Wo Contrast  Result Date: 04/18/2022 CLINICAL DATA:  Initial evaluation for MSSA bacteremia. EXAM: MRI LUMBAR SPINE WITHOUT AND WITH CONTRAST  TECHNIQUE: Multiplanar and multiecho pulse sequences of the lumbar spine were obtained without and with intravenous contrast. CONTRAST:  [id number]aRaefHarmon Pierr GADOBUTROL 1 MMOL/ML IV SOLN COMPARISON:  Prior radiograph from 01/14/2010. FINDINGS: Segmentation: Standard. Lowest well-formed disc space labeled the L5-S1 level. Alignment: Physiologic with preservation of the normal lumbar lordosis. No listhesis. Vertebrae: Vertebral body height maintained without acute or chronic fracture. Bone marrow signal intensity somewhat diffusely decreased on T1 weighted imaging, nonspecific, but most commonly related to anemia, smoking, or obesity. Few small benign hemangiomata noted. No worrisome osseous lesions. Reactive marrow  edema and enhancement seen about the L4-5 facets bilaterally. Finding is nonspecific, and could be related to degenerative facet arthritis. Possible changes of septic arthritis not excluded. No other evidence for osteomyelitis discitis or septic arthritis elsewhere within the lumbar spine. Conus medullaris and cauda equina: Conus extends to the T12-L1 level. Conus and cauda equina appear normal. No epidural abscess or other collection. Paraspinal and other soft tissues: Paraspinous edema and enhancement adjacent to the L4-5 facets bilaterally. No drainable soft tissue collections. Small volume edema/free fluid noted within the presacral space as well. Disc levels: L1-2: Normal interspace. Mild left greater than right facet hypertrophy. No canal or foraminal stenosis. L2-3: Disc desiccation without significant disc bulge. Mild bilateral facet hypertrophy. No canal or foraminal stenosis. L3-4: Disc desiccation with mild diffuse disc bulge. Mild facet and ligament flavum hypertrophy. No significant spinal stenosis. Foramina remain patent. L4-5: Disc desiccation with mild disc bulge. Moderate bilateral facet hypertrophy with associated small joint effusions, reactive marrow edema, and enhancement. Resultant mild  bilateral subarticular stenosis. Central canal remains patent. Foramina remain patent as well. L5-S1: Negative interspace. Mild bilateral facet hypertrophy. Mild epidural lipomatosis. No canal or foraminal stenosis. IMPRESSION: 1. Reactive marrow edema and enhancement about the L4-5 facets bilaterally. Given the provided history of bacteremia, changes are concerning for possible acute septic arthritis. Correlation with laboratory values and symptomatology recommended. No abscess or drainable fluid collection. 2. No other evidence for acute infection within the lumbar spine. 3. Mild disc bulge with facet hypertrophy at L4-5 with resultant mild bilateral subarticular stenosis. 4. Additional mild noncompressive disc bulging elsewhere within the lumbar spine without significant stenosis or neural impingement. Electronically Signed   By: Rise MuBenjamin  McClintock M.D.   On: 04/18/2022 21:50   MR THORACIC SPINE W WO CONTRAST  Result Date: 04/18/2022 CLINICAL DATA:  Initial evaluation for MSSA bacteremia. EXAM: MRI THORACIC WITHOUT AND WITH CONTRAST TECHNIQUE: Multiplanar and multiecho pulse sequences of the thoracic spine were obtained without and with intravenous contrast. CONTRAST:  10mL GADAVIST GADOBUTROL 1 MMOL/ML IV SOLN COMPARISON:  None Available. FINDINGS: Alignment: Mild levoscoliosis. Alignment otherwise normal preservation of the normal lumbar lordosis. No listhesis. Vertebrae: Vertebral body height maintained without acute or chronic fracture. 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 Diffuse loss of normal bone marrow signal, nonspecific but can be seen with anemia, smoking, obesity, and infiltrative/myelofibrotic marrow processes. No discrete or worrisome osseous lesions. No abnormal marrow edema or enhancement. No evidence for osteomyelitis discitis or septic arthritis. Cord: Normal signal. Subtle flattening of the posterior margin of the midthoracic cord at the level of T7 (series 25, image 11),  nonspecific, but could be related to an underlying intradural arachnoid cyst or web. No epidural abscess or other evidence for intracanalicular infection. Paraspinal and other soft tissues: Paraspinous soft tissues demonstrate no acute finding. Small layering bilateral pleural effusions noted. Disc levels: T1-2: Negative interspace. Mild left-sided facet hypertrophy. No stenosis. T2-3: Segmental fusion of T2 and T3.  No stenosis. T3-4: Mild disc bulge. Mild left-sided facet hypertrophy. No spinal stenosis. Foramina remain patent. T4-5: Mild disc bulge with small right paracentral disc protrusion. Mild posterior element hypertrophy. Minimal flattening of the ventral cord without cord signal changes. Mild prominence of the dorsal epidural fat. No significant spinal stenosis. Foramina remain patent. T5-6: Tiny central disc protrusion minimally indents the ventral thecal sac. Mild facet hypertrophy. Prominence of the dorsal epidural fat. No stenosis. T6-7: Small chronic endplate Schmorl's node deformities without significant disc bulge. Mild posterior element hypertrophy with  prominence of the dorsal epidural fat. No stenosis. T7-8: Small left paracentral disc protrusion with annular fissure indents the left ventral thecal sac (series 22, image 25). Flattening of the posterior margin of the cord at this level. No cord signal changes. Mild prominence of the dorsal epidural fat. No significant spinal stenosis. Foramina remain patent. T8-9: Small chronic endplate Schmorl's node deformities. Small left paracentral disc protrusion indents the left ventral thecal sac. Prominence of the dorsal epidural fat. No spinal stenosis. Foramina remain patent. T9-10: Small chronic endplate Schmorl's node deformities without significant disc bulge. Mild facet hypertrophy. No canal or foraminal stenosis. T10-11: Small chronic endplate Schmorl's node deformities without significant disc bulge. Mild facet hypertrophy. No stenosis. T11-12:  Small chronic endplate Schmorl's node deformities without significant disc bulge. No stenosis. T12-L1: Normal interspace. Mild left greater than right facet hypertrophy. No stenosis. IMPRESSION: 1. No MRI evidence for acute infection within the thoracic spine. 2. Underlying mild multilevel degenerative spondylosis and facet hypertrophy as above. No significant stenosis or neural impingement. 3. Small layering bilateral pleural effusions. Electronically Signed   By: Rise Mu M.D.   On: 04/18/2022 21:37    Scheduled Meds:  amLODipine  10 mg Oral Daily   atorvastatin  20 mg Oral Daily   furosemide  20 mg Intravenous Once   furosemide  60 mg Intravenous Q12H   insulin aspart  0-5 Units Subcutaneous QHS   insulin aspart  0-9 Units Subcutaneous TID WC   insulin glargine-yfgn  10 Units Subcutaneous Daily   lidocaine  1 patch Transdermal Q24H   losartan  50 mg Oral Daily   Continuous Infusions:   ceFAZolin (ANCEF) IV 200 mL/hr at 04/20/22 0504     LOS: 6 days   Burnadette Pop, MD [facility] P2/10/2022, 12:00 PM

## 2022-04-20 NOTE — Interval H&P Note (Signed)
History and Physical Interval Note:  04/20/2022 8:46 AM  Carl [name]  has presented today for surgery, with the diagnosis of BACTERIMIA.  The various methods of treatment have been discussed with the patient and family. After consideration of risks, benefits and other options for treatment, the patient has consented to  Procedure(s): TRANSESOPHAGEAL ECHOCARDIOGRAM (TEE) (N/A) as a surgical intervention.  The patient's history has been reviewed, patient examined, no change in status, stable for surgery.  I have reviewed the patient's chart and labs.  Questions were answered to the patient's satisfaction.     [name]

## 2022-04-20 NOTE — Progress Notes (Signed)
  Echocardiogram 2D Echocardiogram has been performed.  Carl [name] 04/20/2022, 10:16 AM

## 2022-04-20 NOTE — Anesthesia Preprocedure Evaluation (Addendum)
Anesthesia Evaluation  Patient identified by MRN, date of birth, ID band Patient awake    Reviewed: Allergy & Precautions, NPO status , Patient's Chart, lab work & pertinent test results  History of Anesthesia Complications Negative for: history of anesthetic complications  Airway Mallampati: II  TM Distance: >3 FB Neck ROM: Full    Dental  (+) Missing,    Pulmonary former smoker   Pulmonary exam normal        Cardiovascular hypertension, Pt. on medications Normal cardiovascular exam     Neuro/Psych negative neurological ROS  negative psych ROS   GI/Hepatic negative GI ROS, Neg liver ROS,,,  Endo/Other  diabetes, Type 2, Oral Hypoglycemic Agents  Morbid obesity  Renal/GU negative Renal ROS  negative genitourinary   Musculoskeletal negative musculoskeletal ROS (+)    Abdominal   Peds  Hematology  (+) Blood dyscrasia (Hgb 10.5), anemia   Anesthesia Other Findings Bacteremia  Reproductive/Obstetrics negative OB ROS                             Anesthesia Physical Anesthesia Plan  ASA: 3  Anesthesia Plan: MAC   Post-op Pain Management: Minimal or no pain anticipated   Induction:   PONV Risk Score and Plan: 1 and Treatment may vary due to age or medical condition and Propofol infusion  Airway Management Planned: Natural Airway and Nasal Cannula  Additional Equipment: None  Intra-op Plan:   Post-operative Plan:   Informed Consent: I have reviewed the patients History and Physical, chart, labs and discussed the procedure including the risks, benefits and alternatives for the proposed anesthesia with the patient or authorized representative who has indicated his/her understanding and acceptance.       Plan Discussed with: CRNA  Anesthesia Plan Comments:        Anesthesia Quick Evaluation

## 2022-04-20 NOTE — Inpatient Diabetes Management (Signed)
Inpatient Diabetes Program Recommendations  AACE/ADA: New Consensus Statement on Inpatient Glycemic Control (2015)  Target Ranges:  Prepandial:   less than 140 mg/dL      Peak postprandial:   less than 180 mg/dL (1-2 hours)      Critically ill patients:  140 - 180 mg/dL   Lab Results  Component Value Date   GLUCAP 225 (H) 04/20/2022   HGBA1C 7.8 (H) 04/14/2022    Review of Glycemic Control  Diabetes history: DM2 Outpatient Diabetes medications: glipizide 5 mg QD, metformin 1000 mg BID Current orders for Inpatient glycemic control: Semglee 5 QD, Novolog 0-9 units TID with meals and 0-5 HS  HgbA1C - 7.8% Post-prandials elevated in 200s. Eating 100% May benefit from addition of meal coverage insulin.  Inpatient Diabetes Program Recommendations:    Consider adding Novolog 3 units TID with meals if eating > 50%.  Continue to follow glucose trends.  Thank you. Lorenda Peck, RD, LDN, [place] Inpatient Diabetes Coordinator [phone]

## 2022-04-20 NOTE — Anesthesia Postprocedure Evaluation (Signed)
Anesthesia Post Note  Patient: Carl [name]  Procedure(s) Performed: TRANSESOPHAGEAL ECHOCARDIOGRAM (TEE) BUBBLE STUDY     Patient location during evaluation: PACU Anesthesia Type: MAC Level of consciousness: awake and alert Pain management: pain level controlled Vital Signs Assessment: post-procedure vital signs reviewed and stable Respiratory status: spontaneous breathing, nonlabored ventilation and respiratory function stable Cardiovascular status: blood pressure returned to baseline Postop Assessment: no apparent nausea or vomiting Anesthetic complications: no   No notable events documented.  Last Vitals:  Vitals:   04/20/22 1000 04/20/22 1008  BP: 121/71 123/66  Pulse: 74 67  Resp: 20 14  Temp:    SpO2: 97% 95%    Last Pain:  Vitals:   04/20/22 1008  TempSrc:   PainSc: 0-No pain                 Marthenia Rolling

## 2022-04-20 NOTE — Anesthesia Procedure Notes (Signed)
Procedure Name: MAC Date/Time: 04/20/2022 9:35 AM  Performed by: Lowella Dell, CRNAPre-anesthesia Checklist: Patient identified, Emergency Drugs available, Suction available, Patient being monitored and Timeout performed Patient Re-evaluated:Patient Re-evaluated prior to induction Oxygen Delivery Method: Nasal cannula Placement Confirmation: positive ETCO2 Dental Injury: Teeth and Oropharynx as per pre-operative assessment

## 2022-04-21 LAB — BASIC METABOLIC PANEL
Anion gap: 11 (ref 5–15)
BUN: 17 mg/dL (ref 6–20)
CO2: 29 mmol/L (ref 22–32)
Calcium: 7.3 mg/dL — ABNORMAL LOW (ref 8.9–10.3)
Chloride: 94 mmol/L — ABNORMAL LOW (ref 98–111)
Creatinine, Ser: 0.79 mg/dL (ref 0.61–1.24)
GFR, Estimated: >60 mL/min (ref >60)
Glucose, Bld: 209 mg/dL — ABNORMAL HIGH (ref 70–99)
Potassium: 3.3 mmol/L — ABNORMAL LOW (ref 3.5–5.1)
Sodium: 134 mmol/L — ABNORMAL LOW (ref 135–145)

## 2022-04-21 LAB — GLUCOSE, CAPILLARY
Glucose-Capillary: 225 mg/dL — ABNORMAL HIGH (ref 70–99)
Glucose-Capillary: 257 mg/dL — ABNORMAL HIGH (ref 70–99)
Glucose-Capillary: 177 mg/dL — ABNORMAL HIGH (ref 70–99)
Glucose-Capillary: 246 mg/dL — ABNORMAL HIGH (ref 70–99)

## 2022-04-21 MED ORDER — POTASSIUM CHLORIDE CRYS ER 20 MEQ PO TBCR
40.0000 meq | EXTENDED_RELEASE_TABLET | Freq: Once | ORAL | Status: AC
  Administered 2022-04-21: 40 meq via ORAL
  Filled 2022-04-21: qty 2

## 2022-04-21 MED ORDER — FUROSEMIDE 10 MG/ML IJ SOLN
60.0000 mg | Freq: Two times a day (BID) | INTRAMUSCULAR | Status: DC
  Administered 2022-04-21 (×2): 60 mg via INTRAVENOUS
  Filled 2022-04-21 (×2): qty 6

## 2022-04-21 MED ORDER — CHLORHEXIDINE GLUCONATE CLOTH 2 % EX PADS
6.0000 | MEDICATED_PAD | Freq: Every day | CUTANEOUS | Status: DC
  Administered 2022-04-21 – 2022-04-22 (×2): 6 via TOPICAL

## 2022-04-21 MED ORDER — SODIUM CHLORIDE 0.9% FLUSH: 10.0000 mL | INTRAVENOUS | Status: DC | PRN

## 2022-04-21 MED ORDER — CEFAZOLIN IV (FOR PTA / DISCHARGE USE ONLY): 2.0000 g | Freq: Three times a day (TID) | INTRAVENOUS | 0 refills | Status: AC

## 2022-04-21 MED ORDER — SODIUM CHLORIDE 0.9% FLUSH
10.0000 mL | Freq: Two times a day (BID) | INTRAVENOUS | Status: DC
  Administered 2022-04-21 – 2022-04-22 (×3): 10 mL

## 2022-04-21 NOTE — Progress Notes (Signed)
Peripherally Inserted Central Catheter Placement  The IV Nurse has discussed with the patient and/or persons authorized to consent for the patient, the purpose of this procedure and the potential benefits and risks involved with this procedure.  The benefits include less needle sticks, lab draws from the catheter, and the patient may be discharged home with the catheter. Risks include, but not limited to, infection, bleeding, blood clot (thrombus formation), and puncture of an artery; nerve damage and irregular heartbeat and possibility to perform a PICC exchange if needed/ordered by physician.  Alternatives to this procedure were also discussed.  Bard Power PICC patient education guide, fact sheet on infection prevention and patient information card has been provided to patient /or left at bedside.    PICC Placement Documentation  PICC Single Lumen [id number] Right Basilic 39 cm 0 cm (Active)  Indication for Insertion or Continuance of Line Prolonged intravenous therapies 04/21/22 1030  Exposed Catheter (cm) 0 cm 04/21/22 1030  Site Assessment Clean, Dry, Intact 04/21/22 1030  Line Status Flushed;Blood return noted;Saline locked 04/21/22 1030  Dressing Type Transparent 04/21/22 1030  Dressing Status Antimicrobial disc in place 04/21/22 1030  Safety Lock Not Applicable [id number]  Line Care Connections checked and tightened 04/21/22 1030  Line Adjustment (NICU/IV Team Only) No 04/21/22 1030  Dressing Intervention New dressing 04/21/22 1030  Dressing Change Due 04/28/22 04/21/22 1030       Frankie Scipio Ramos 04/21/2022, 10:39 AM

## 2022-04-21 NOTE — Inpatient Diabetes Management (Signed)
Inpatient Diabetes Program Recommendations  AACE/ADA: New Consensus Statement on Inpatient Glycemic Control (2015)  Target Ranges:  Prepandial:   less than 140 mg/dL      Peak postprandial:   less than 180 mg/dL (1-2 hours)      Critically ill patients:  140 - 180 mg/dL    Latest Reference Range & Units 04/20/22 07:20 04/20/22 09:18 04/20/22 11:41 04/20/22 16:19 04/20/22 21:17  Glucose-Capillary 70 - 99 mg/dL 221 (H)  Novolog NOT given (pt off floor) 224 (H) 225 (H)  3  units Novolog @1338$   10 units Semglee @1117$  233 (H)  3 units Novolog @1737$  199 (H)  (H): Data is abnormally high  Latest Reference Range & Units 04/21/22 07:27  Glucose-Capillary 70 - 99 mg/dL 225 (H)  3 units Novolog   (H): Data is abnormally high     Home DM Meds: Glipizide 5 mg daily        Metformin 1000 mg BID  Current Orders: Semglee 10 units daily      Novolog Sensitive Correction Scale/ SSI (0-9 units) TID AC + HS    MD- Note home oral DM meds are on hold.  CBGs >200.  Please consider:  1. Increase Semglee to 12 units Daily (if 10 unit dose already given this AM, please also give an extra 2 units Semglee X 1 dose this AM)  2. Start Novolog Meal Coverage: Novolog 3 units TID with meals HOLD if pt NPO HOLD if pt eats <50% meals    --Will follow patient during hospitalization--  Wyn Quaker RN, MSN, [place] Diabetes Coordinator Inpatient Glycemic Control Team Team Pager: [phone] (8a-5p)

## 2022-04-21 NOTE — TOC Progression Note (Signed)
Transition of Care [facility]) - Progression Note    Patient Details  Name: Carl [name] MRN: [id number] Date of Birth: Jul 16, 1965  Transition of Care [facility]) CM/SW [place], RN Phone Number:[phone]  04/21/2022, 11:51 AM  Clinical Narrative:    CM  received message from Carolynn Sayers with [employer] for home IV abx. Per Pam she is awaiting PICC placement so that she can come to do teaching and add extension. Pam will also need a final home health order.         Expected Discharge Plan and Services                                               Social Determinants of Health (SDOH) Interventions SDOH Screenings   Food Insecurity: No Food Insecurity (04/17/2022)  Housing: Low Risk  (04/17/2022)  Transportation Needs: No Transportation Needs (04/17/2022)  Utilities: Not At Risk (04/17/2022)  Depression (PHQ2-9): Low Risk  (10/03/2021)  Tobacco Use: Medium Risk (04/20/2022)    Readmission Risk Interventions     No data to display

## 2022-04-21 NOTE — Progress Notes (Signed)
PROGRESS NOTE  Carl [name]  [id number] DOB: [birth year] DOA: 04/13/2022 PCP: Eppie Gibson, MD   Brief Narrative: Patient is a 57 year old male with history of diabetes type 2 who presented with 6-day history of nausea, vomiting, diarrhea.  Lab work showed AKI, hyponatremia, elevated liver enzymes.  Hospital course remarkable for finding of MSSA bacteremia, UTI.  ID consulted and following.  Lumbar MRI showed active septic arthritis at L4-L5 with no fluid collection or abscess.  TEE did not show any vegetation, underwent  PICC line placement today with discharge plan to home with IV antibiotics tomorrow.    Assessment & Plan:  Principal Problem:   AKI (acute kidney injury) ([place]) Active Problems:   Hyperlipidemia   Hypertension   Type 2 diabetes mellitus (HCC)   Hyponatremia   UTI (urinary tract infection)   Nausea and vomiting   Diarrhea   Elevated liver enzymes   Acute lower UTI   Staphylococcus aureus bacteremia   Hypoxia   Thrombocytopenia (HCC)  AKI: Resolved likely from dehydration in the setting of vomiting and diarrhea.  Currently kidney function stable.  IV fluids stopped  MSSA bacteremia/MSSA UTI: Febrile during this hospitalization.  Chest x-ray did not show any acute findings.  No abdominal pain.  No presence of hardware.  Lumbar spine MRI showed acute septic arthritis at L4-L5 without any fluid collection or abscess.  TEE negative for vegetation. Plan for 6 weeks of antibiotics course, undergoing PICC line placement  Elevated liver enzymes: No history of heavy alcohol use.  Right upper quadrant ultrasound showed fatty liver. Hepatitis panel negative. Liver enzymes improved and normalized  Hyponatremia: Improved.  Acute hypoxic respiratory failure/volume overload:  Chest Xray  negative findings.  Echo without significant findings, showed normal left ventricle ejection fraction, grade 1 diastolic dysfunction.  D-dimer was positive but lower extremity Doppler, VQ scan  negative.  Obesity hypoventilation syndrome is a possibility.  Chest x-ray on 2/7 showed  low lung volumes.  Patient looks volume overloaded with elevated BNP. Started  IV diuresis.  Wean oxygen as tolerated. He might have been given a lot of fluids while he was hypotensive with AKI earlier.  Continue IV Lasix today.  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  Hemoglobin A1c of 7.8.  On glipizide, metformin at home.  Continue current insulin regimen while at hospital.  Normocytic anemia/thrombocytopenia: Currently hemoglobin/platelet count stable.  Thrombocytopenia could be from sepsis/bacteremia.    Hypertension: On Norvasc.  Home losartan on hold,resumed  Hypokalemia: Supplemented with potassium,will be monitored  Hyperlipidemia: On lipitor  Nausea, vomiting, diarrhea: Present on admission.  Resolved .C. difficile, GI pathogen panel negative.  Deconditioning/weakness: PT saw the patient, no follow-up recommended  Morbid obesity: BMI of  40        DVT prophylaxis:Place and maintain sequential compression device Start: 04/16/22 0816     Code Status: Full Code  Family Communication: None at bedside  Patient status:Inpatient  Patient is from :Home  Anticipated discharge NE:[id number]  Estimated DC date: On IV Lasix.  Discharge tomorrow   Consultants: ID  Procedures:MRI, TEE  Antimicrobials:  Anti-infectives (From admission, onward)    Start     Dose/Rate Route Frequency Ordered Stop   04/16/22 1800  vancomycin (VANCOREADY) IVPB 1500 mg/300 mL  Status:  Discontinued        1,500 mg 150 mL/hr over 120 Minutes Intravenous Every 24 hours 04/15/22 1548 04/16/22 1148   04/16/22 1800  vancomycin (VANCOCIN) IVPB 1000  mg/200 mL premix  Status:  Discontinued        1,000 mg 200 mL/hr over 60 Minutes Intravenous Every 12 hours 04/16/22 1148 04/16/22 1314   04/16/22 1400  ceFAZolin (ANCEF) IVPB 2g/100 mL premix        2 g 200 mL/hr  over 30 Minutes Intravenous Every 8 hours 04/16/22 1314     04/15/22 1830  vancomycin (VANCOREADY) IVPB 500 mg/100 mL       See Hyperspace for full Linked Orders Report.   500 mg 100 mL/hr over 60 Minutes Intravenous  Once 04/15/22 1531 04/15/22 2005   04/15/22 1630  vancomycin (VANCOREADY) IVPB 2000 mg/400 mL       See Hyperspace for full Linked Orders Report.   2,000 mg 200 mL/hr over 120 Minutes Intravenous  Once 04/15/22 1531 04/15/22 1858   04/15/22 0500  cefTRIAXone (ROCEPHIN) 1 g in sodium chloride 0.9 % 100 mL IVPB  Status:  Discontinued        1 g 200 mL/hr over 30 Minutes Intravenous Every 24 hours 04/14/22 0515 04/14/22 0859   04/14/22 0500  cefTRIAXone (ROCEPHIN) 1 g in sodium chloride 0.9 % 100 mL IVPB        1 g 200 mL/hr over 30 Minutes Intravenous  Once 04/14/22 0456 04/14/22 0700       Subjective: Patient seen and examined at bedside today.  Hemodynamically stable.  On room air today.  Still have bilateral lower extremity swelling, crackles on auscultation.  We discussed about staying 1 more day for IV Lasix.   Objective: Vitals:   04/20/22 1342 04/20/22 2114 04/20/22 2211 04/21/22 0512  BP: 128/61 121/73  (!) 143/72  Pulse: 76 80 76 70  Resp: 16 18  18  $ Temp: 98.6 F (37 C) 97.6 F (36.4 C)  97.7 F (36.5 C)  TempSrc: Oral Oral  Oral  SpO2: 94% 98% 95% 91%  Weight:      Height:        Intake/Output Summary (Last 24 hours) at 04/21/2022 1116 Last data filed at 04/21/2022 0820 Gross per 24 hour  Intake 720 ml  Output 3550 ml  Net -2830 ml   Filed Weights   04/15/22 0623  Weight: 126.6 kg    Examination:   General exam: Overall comfortable, not in distress,obese HEENT: PERRL Respiratory system: Bilateral basal crackles Cardiovascular system: S1 & S2 heard, RRR.  Gastrointestinal system: Abdomen is nondistended, soft and nontender. Central nervous system: Alert and oriented Extremities: Bilateral lower extremity pitting edema, no clubbing ,no  cyanosis Skin: No rashes, no ulcers,no icterus     Data Reviewed: I have personally reviewed following labs and imaging studies  CBC: Recent Labs  Lab 04/15/22 0606 04/16/22 0626 04/17/22 1034 04/19/22 0619  WBC 7.2 8.0 8.9 9.2  HGB 9.7* 10.0* 10.8* 10.5*  HCT 29.4* 31.1* 32.4* 32.2*  MCV 84.7 86.9 85.0 87.3  PLT 125* 99* 120* 0000000   Basic Metabolic Panel: Recent Labs  Lab 04/17/22 1034 04/18/22 0524 04/19/22 0619 04/20/22 0516 04/21/22 0435  NA 129* 132* 133* 135 134*  K 3.5 3.7 3.4* 3.5 3.3*  CL 93* 95* 95* 93* 94*  CO2 26 27 27 30 29  $ GLUCOSE 270* 226* 235* 217* 209*  BUN 24* 22* 16 16 17  $ CREATININE 1.20 1.09 0.91 0.89 0.79  CALCIUM 7.6* 7.7* 7.6* 7.7* 7.3*     Recent Results (from the past 240 hour(s))  Resp panel by RT-PCR (RSV, Flu A&B, Covid) Anterior Nasal Swab  Status: None   Collection Time: 04/13/22 10:26 PM   Specimen: Anterior Nasal Swab  Result Value Ref Range Status   SARS Coronavirus 2 by RT PCR NEGATIVE NEGATIVE Final    Comment: (NOTE) SARS-CoV-2 target nucleic acids are NOT DETECTED.  The SARS-CoV-2 RNA is generally detectable in upper respiratory specimens during the acute phase of infection. The lowest concentration of SARS-CoV-2 viral copies this assay can detect is 138 copies/mL. A negative result does not preclude SARS-Cov-2 infection and should not be used as the sole basis for treatment or other patient management decisions. A negative result may occur with  improper specimen collection/handling, submission of specimen other than nasopharyngeal swab, presence of viral mutation(s) within the areas targeted by this assay, and inadequate number of viral copies(<138 copies/mL). A negative result must be combined with clinical observations, patient history, and epidemiological information. The expected result is Negative.  Fact Sheet for Patients:  EntrepreneurPulse.com.au  Fact Sheet for Healthcare Providers:   IncredibleEmployment.be  This test is no t yet approved or cleared by the [place] FDA and  has been authorized for detection and/or diagnosis of SARS-CoV-2 by FDA under an Emergency Use Authorization (EUA). This EUA will remain  in effect (meaning this test can be used) for the duration of the COVID-19 declaration under Section 564(b)(1) of the Act, 21 U.S.C.section 360bbb-3(b)(1), unless the authorization is terminated  or revoked sooner.       Influenza A by PCR NEGATIVE NEGATIVE Final   Influenza B by PCR NEGATIVE NEGATIVE Final    Comment: (NOTE) The Xpert Xpress SARS-CoV-2/FLU/RSV plus assay is intended as an aid in the diagnosis of influenza from Nasopharyngeal swab specimens and should not be used as a sole basis for treatment. Nasal washings and aspirates are unacceptable for Xpert Xpress SARS-CoV-2/FLU/RSV testing.  Fact Sheet for Patients: EntrepreneurPulse.com.au  Fact Sheet for Healthcare Providers: IncredibleEmployment.be  This test is not yet approved or cleared by the [place] FDA and has been authorized for detection and/or diagnosis of SARS-CoV-2 by FDA under an Emergency Use Authorization (EUA). This EUA will remain in effect (meaning this test can be used) for the duration of the COVID-19 declaration under Section 564(b)(1) of the Act, 21 U.S.C. section 360bbb-3(b)(1), unless the authorization is terminated or revoked.     Resp Syncytial Virus by PCR NEGATIVE NEGATIVE Final    Comment: (NOTE) Fact Sheet for Patients: EntrepreneurPulse.com.au  Fact Sheet for Healthcare Providers: IncredibleEmployment.be  This test is not yet approved or cleared by the [place] FDA and has been authorized for detection and/or diagnosis of SARS-CoV-2 by FDA under an Emergency Use Authorization (EUA). This EUA will remain in effect (meaning this test can be used) for  the duration of the COVID-19 declaration under Section 564(b)(1) of the Act, 21 U.S.C. section 360bbb-3(b)(1), unless the authorization is terminated or revoked.  Performed at [facility], [place] [address] [place], [place] [address]   Urine Culture (for pregnant, neutropenic or urologic patients or patients with an indwelling urinary catheter)     Status: Abnormal   Collection Time: 04/14/22  4:34 AM   Specimen: Urine, Clean Catch  Result Value Ref Range Status   Specimen Description   Final    URINE, CLEAN CATCH Performed at [facility], [place] [address] [place], [place] [address]    Special Requests   Final    NONE Performed at [facility], [place] [address], [place], [place] [address]  Culture >=100,000 COLONIES/mL STAPHYLOCOCCUS AUREUS (A)  Final   Report Status 04/16/2022 FINAL  Final   Organism ID, Bacteria STAPHYLOCOCCUS AUREUS (A)  Final      Susceptibility   Staphylococcus aureus - MIC*    CIPROFLOXACIN <=0.5 SENSITIVE Sensitive     GENTAMICIN <=0.5 SENSITIVE Sensitive     NITROFURANTOIN 32 SENSITIVE Sensitive     OXACILLIN 0.5 SENSITIVE Sensitive     TETRACYCLINE <=1 SENSITIVE Sensitive     VANCOMYCIN 1 SENSITIVE Sensitive     TRIMETH/SULFA <=10 SENSITIVE Sensitive     CLINDAMYCIN RESISTANT Resistant     RIFAMPIN <=0.5 SENSITIVE Sensitive     Inducible Clindamycin POSITIVE Resistant     * >=100,000 COLONIES/mL STAPHYLOCOCCUS AUREUS  C Difficile Quick Screen w PCR reflex     Status: None   Collection Time: 04/14/22  6:40 AM   Specimen: STOOL  Result Value Ref Range Status   C Diff antigen NEGATIVE NEGATIVE Final   C Diff toxin NEGATIVE NEGATIVE Final   C Diff interpretation No C. difficile detected.  Final    Comment: Performed at [facility], [place] [address], [place], [place] [address]  Gastrointestinal Panel by PCR , Stool     Status: None   Collection Time: 04/14/22  6:40 AM    Specimen: STOOL  Result Value Ref Range Status   Campylobacter species NOT DETECTED NOT DETECTED Final   Plesimonas shigelloides NOT DETECTED NOT DETECTED Final   Salmonella species NOT DETECTED NOT DETECTED Final   Yersinia enterocolitica NOT DETECTED NOT DETECTED Final   Vibrio species NOT DETECTED NOT DETECTED Final   Vibrio cholerae NOT DETECTED NOT DETECTED Final   Enteroaggregative E coli (EAEC) NOT DETECTED NOT DETECTED Final   Enteropathogenic E coli (EPEC) NOT DETECTED NOT DETECTED Final   Enterotoxigenic E coli (ETEC) NOT DETECTED NOT DETECTED Final   Shiga like toxin producing E coli (STEC) NOT DETECTED NOT DETECTED Final   Shigella/Enteroinvasive E coli (EIEC) NOT DETECTED NOT DETECTED Final   Cryptosporidium NOT DETECTED NOT DETECTED Final   Cyclospora cayetanensis NOT DETECTED NOT DETECTED Final   Entamoeba histolytica NOT DETECTED NOT DETECTED Final   Giardia lamblia NOT DETECTED NOT DETECTED Final   Adenovirus F40/41 NOT DETECTED NOT DETECTED Final   Astrovirus NOT DETECTED NOT DETECTED Final   Norovirus GI/GII NOT DETECTED NOT DETECTED Final   Rotavirus A NOT DETECTED NOT DETECTED Final   Sapovirus (I, II, IV, and V) NOT DETECTED NOT DETECTED Final    Comment: Performed at [facility], [place] [place], [place] [address]  Culture, blood (Routine X 2) w Reflex to ID Panel     Status: Abnormal   Collection Time: 04/15/22  4:21 PM   Specimen: BLOOD LEFT HAND  Result Value Ref Range Status   Specimen Description   Final    BLOOD LEFT HAND BOTTLES DRAWN AEROBIC AND ANAEROBIC Performed at [facility], [place] [address], [place], [place] [address]    Special Requests   Final    Blood Culture adequate volume Performed at [place] [address] [place], [place] [address]    Culture  Setup Time   Final    IN BOTH AEROBIC AND ANAEROBIC BOTTLES GRAM POSITIVE COCCI IN CLUSTERS CRITICAL RESULT CALLED TO, READ BACK  BY AND VERIFIED WITH:  C/ PHARMD E. WILLIAMSON 04/16/22 1307 A. LAFRANCE Performed at [facility], [place] [address] [place], [place] [address]  Culture STAPHYLOCOCCUS AUREUS (A)  Final   Report Status 04/18/2022 FINAL  Final   Organism ID, Bacteria STAPHYLOCOCCUS AUREUS  Final      Susceptibility   Staphylococcus aureus - MIC*    CIPROFLOXACIN <=0.5 SENSITIVE Sensitive     ERYTHROMYCIN RESISTANT Resistant     GENTAMICIN <=0.5 SENSITIVE Sensitive     OXACILLIN 0.5 SENSITIVE Sensitive     TETRACYCLINE <=1 SENSITIVE Sensitive     VANCOMYCIN 1 SENSITIVE Sensitive     TRIMETH/SULFA <=10 SENSITIVE Sensitive     CLINDAMYCIN RESISTANT Resistant     RIFAMPIN <=0.5 SENSITIVE Sensitive     Inducible Clindamycin POSITIVE Resistant     * STAPHYLOCOCCUS AUREUS  Blood Culture ID Panel (Reflexed)     Status: Abnormal   Collection Time: 04/15/22  4:21 PM  Result Value Ref Range Status   Enterococcus faecalis NOT DETECTED NOT DETECTED Final   Enterococcus Faecium NOT DETECTED NOT DETECTED Final   Listeria monocytogenes NOT DETECTED NOT DETECTED Final   Staphylococcus species DETECTED (A) NOT DETECTED Final    Comment: CRITICAL RESULT CALLED TO, READ BACK BY AND VERIFIED WITH:  C/ PHARMD E. WILLIAMSON 04/16/22 1307 A. LAFRANCE    Staphylococcus aureus (BCID) DETECTED (A) NOT DETECTED Final    Comment: CRITICAL RESULT CALLED TO, READ BACK BY AND VERIFIED WITH:  C/ PHARMD E. WILLIAMSON 04/16/22 1307 A. LAFRANCE    Staphylococcus epidermidis NOT DETECTED NOT DETECTED Final   Staphylococcus lugdunensis NOT DETECTED NOT DETECTED Final   Streptococcus species NOT DETECTED NOT DETECTED Final   Streptococcus agalactiae NOT DETECTED NOT DETECTED Final   Streptococcus pneumoniae NOT DETECTED NOT DETECTED Final   Streptococcus pyogenes NOT DETECTED NOT DETECTED Final   A.calcoaceticus-baumannii NOT DETECTED NOT DETECTED Final   Bacteroides fragilis NOT DETECTED NOT DETECTED Final    Enterobacterales NOT DETECTED NOT DETECTED Final   Enterobacter cloacae complex NOT DETECTED NOT DETECTED Final   Escherichia coli NOT DETECTED NOT DETECTED Final   Klebsiella aerogenes NOT DETECTED NOT DETECTED Final   Klebsiella oxytoca NOT DETECTED NOT DETECTED Final   Klebsiella pneumoniae NOT DETECTED NOT DETECTED Final   Proteus species NOT DETECTED NOT DETECTED Final   Salmonella species NOT DETECTED NOT DETECTED Final   Serratia marcescens NOT DETECTED NOT DETECTED Final   Haemophilus influenzae NOT DETECTED NOT DETECTED Final   Neisseria meningitidis NOT DETECTED NOT DETECTED Final   Pseudomonas aeruginosa NOT DETECTED NOT DETECTED Final   Stenotrophomonas maltophilia NOT DETECTED NOT DETECTED Final   Candida albicans NOT DETECTED NOT DETECTED Final   Candida auris NOT DETECTED NOT DETECTED Final   Candida glabrata NOT DETECTED NOT DETECTED Final   Candida krusei NOT DETECTED NOT DETECTED Final   Candida parapsilosis NOT DETECTED NOT DETECTED Final   Candida tropicalis NOT DETECTED NOT DETECTED Final   Cryptococcus neoformans/gattii NOT DETECTED NOT DETECTED Final   Meth resistant mecA/C and MREJ NOT DETECTED NOT DETECTED Final    Comment: Performed at [facility], [address], [place], [place] [address]  Culture, blood (Routine X 2) w Reflex to ID Panel     Status: Abnormal   Collection Time: 04/15/22  4:29 PM   Specimen: BLOOD RIGHT HAND  Result Value Ref Range Status   Specimen Description   Final    BLOOD RIGHT HAND BOTTLES DRAWN AEROBIC ONLY Performed at [place] [address], [place], [place] [address]    Special Requests   Final    Blood Culture  results may not be optimal due to an inadequate volume of blood received in culture bottles Performed at [facility], [place] [address], [place], [place] [address]    Culture  Setup Time   Final    AEROBIC BOTTLE ONLY GRAM POSITIVE COCCI IN CLUSTERS CRITICAL VALUE NOTED.   VALUE IS CONSISTENT WITH PREVIOUSLY REPORTED AND CALLED VALUE.    Culture (A)  Final    STAPHYLOCOCCUS AUREUS SUSCEPTIBILITIES PERFORMED ON PREVIOUS CULTURE WITHIN THE LAST 5 DAYS. Performed at [facility], [place] [address], [place], [place] [address]    Report Status 04/18/2022 FINAL  Final  Culture, blood (Routine X 2) w Reflex to ID Panel     Status: None (Preliminary result)   Collection Time: 04/17/22  5:47 AM   Specimen: BLOOD  Result Value Ref Range Status   Specimen Description   Final    BLOOD BLOOD LEFT ARM Performed at [place] [address], [place], [place] [address]    Special Requests   Final    BOTTLES DRAWN AEROBIC AND ANAEROBIC Blood Culture adequate volume Performed at [place] [address], [place], [place] [address]    Culture   Final    NO GROWTH 4 DAYS Performed at [facility], [place] [address] [place], [place] [address]    Report Status PENDING  Incomplete  Culture, blood (Routine X 2) w Reflex to ID Panel     Status: None (Preliminary result)   Collection Time: 04/17/22  5:47 AM   Specimen: BLOOD  Result Value Ref Range Status   Specimen Description   Final    BLOOD BLOOD LEFT HAND Performed at [place] [address], [place], [place] [address]    Special Requests   Final    BOTTLES DRAWN AEROBIC AND ANAEROBIC Blood Culture adequate volume Performed at [place] [address] [place], [place] [address]    Culture   Final    NO GROWTH 4 DAYS Performed at [facility], [place] [address], [place],  [address]    Report Status PENDING  Incomplete     Radiology Studies: [place] EKG SITE RITE  Result Date: 04/20/2022 If Site Rite image not attached, placement could not be confirmed due to current cardiac rhythm.  ECHO TEE  Result Date: 04/20/2022    TRANSESOPHOGEAL ECHO REPORT   Patient Name:   Carl [name] Date of Exam: 04/20/2022  Medical Rec #:  [id number]   Height:       70.0 in Accession #:    [id number]  Weight:       279.0 lb Date of Birth:  04-29-1965   BSA:          2.405 m Patient Age:    11 years    BP:           123/66 mmHg Patient Gender: M           HR:           78 bpm. Exam Location:  Inpatient Procedure: Transesophageal Echo, Cardiac Doppler, Color Doppler and Saline            Contrast Bubble Study Indications:    Bacteremia  History:        Patient has prior history of Echocardiogram examinations, most                 recent 04/15/2022. Risk Factors:Hypertension, Diabetes,  Dyslipidemia and Former Smoker.  Sonographer:    Clayton Lefort RDCS (AE) Referring Phys: [id number] Margie Billet PROCEDURE: After discussion of the risks and benefits of a TEE, an informed consent was obtained from the patient. The transesophogeal probe was passed without difficulty through the esophogus of the patient. Local oropharyngeal anesthetic was provided with Cetacaine. Sedation performed by different physician. The patient was monitored while under deep sedation. Anesthestetic sedation was provided intravenously by Anesthesiology: 348.81m of Propofol, 1070mof Lidocaine. Image quality was good. The patient developed no complications during the procedure.  IMPRESSIONS  1. Left ventricular ejection fraction, by estimation, is 55 to 60%. The left ventricle has normal function.  2. Right ventricular systolic function is normal. The right ventricular size is normal.  3. Left atrial size was mildly dilated. No left atrial/left atrial appendage thrombus was detected.  4. The mitral valve is grossly normal. Trivial mitral valve regurgitation.  5. The aortic valve is tricuspid. Aortic valve regurgitation is not visualized.  6. Agitated saline contrast bubble study was negative, with no evidence of any interatrial shunt. Conclusion(s)/Recommendation(s): No evidence of vegetation/infective endocarditis on this transesophageael echocardiogram.  FINDINGS  Left Ventricle: Left ventricular ejection fraction, by estimation, is 55 to 60%. The left ventricle has normal function. The left ventricular internal cavity size was normal in size. There is no left ventricular hypertrophy. Right Ventricle: The right ventricular size is normal. No increase in right ventricular wall thickness. Right ventricular systolic function is normal. Left Atrium: Left atrial size was mildly dilated. No left atrial/left atrial appendage thrombus was detected. Right Atrium: Right atrial size was normal in size. Pericardium: There is no evidence of pericardial effusion. Mitral Valve: The mitral valve is grossly normal. Trivial mitral valve regurgitation. Tricuspid Valve: The tricuspid valve is grossly normal. Tricuspid valve regurgitation is trivial. Aortic Valve: The aortic valve is tricuspid. Aortic valve regurgitation is not visualized. Pulmonic Valve: The pulmonic valve was grossly normal. Pulmonic valve regurgitation is trivial. Aorta: The aortic root and ascending aorta are structurally normal, with no evidence of dilitation. IAS/Shunts: The interatrial septum is aneurysmal. No atrial level shunt detected by color flow Doppler. Agitated saline contrast was given intravenously to evaluate for intracardiac shunting. Agitated saline contrast bubble study was negative, with no evidence of any interatrial shunt. KeLyman BishopD Electronically signed by KeLyman BishopD Signature Date/Time: 04/20/2022/10:48:23 AM    Final     Scheduled Meds:  amLODipine  10 mg Oral Daily   atorvastatin  20 mg Oral Daily   Chlorhexidine Gluconate Cloth  6 each Topical Daily   furosemide  60 mg Intravenous Q12H   insulin aspart  0-5 Units Subcutaneous QHS   insulin aspart  0-9 Units Subcutaneous TID WC   insulin glargine-yfgn  10 Units Subcutaneous Daily   lidocaine  1 patch Transdermal Q24H   losartan  50 mg Oral Daily   sodium chloride flush  10-40 mL Intracatheter Q12H   Continuous  Infusions:   ceFAZolin (ANCEF) IV 2 g (04/21/22 0527)     LOS: 7 days   AmShelly CossMD [facility] P2/11/2022, 11:16 AM

## 2022-04-21 NOTE — Progress Notes (Signed)
Physical Therapy Treatment Patient Details Name: Carl [name] MRN: [id number] DOB: 16-Mar-1965 Today's Date: 04/21/2022   History of Present Illness Pt is 57 yo male admitted 04/13/22 with nausea, vomiting, and diarrhea.  He was admitted with AKI, found to have MSSA bacteremia and UTI.  Pt with hx including but not limited to DM, HLD, HTN, L transmet amputation    PT Comments    Pt AxO x 3 pleasant and willing.  Assisted with amb in hallway went well.  Pt feeling better.  "They are giving me Lasix to get the swelling out of my legs".   Pt plans to D/C to home.   Recommendations for follow up therapy are one component of a multi-disciplinary discharge planning process, led by the attending physician.  Recommendations may be updated based on patient status, additional functional criteria and insurance authorization.  Follow Up Recommendations  No PT follow up     Assistance Recommended at Discharge Intermittent Supervision/Assistance  Patient can return home with the following Assistance with cooking/housework;Help with stairs or ramp for entrance   Equipment Recommendations  None recommended by PT    Recommendations for Other Services       Precautions / Restrictions Precautions Precautions: Fall Restrictions Weight Bearing Restrictions: No     Mobility  Bed Mobility Overal bed mobility: Needs Assistance Bed Mobility: Supine to Sit     Supine to sit: Supervision Sit to supine: Supervision   General bed mobility comments: pt self able with increased time    Transfers Overall transfer level: Needs assistance Equipment used: Rolling walker (2 wheels) Transfers: Sit to/from Stand Sit to Stand: Supervision           General transfer comment: good use of B UE's to steady self    Ambulation/Gait Ambulation/Gait assistance: Supervision, Min guard Gait Distance (Feet): 255 Feet Assistive device: Rolling walker (2 wheels) Gait Pattern/deviations: Step-through pattern,  Decreased stride length, Trunk flexed Gait velocity: decreased     General Gait Details: one VC's on safety with turns, otherwise tolerated distance well.   Stairs             Wheelchair Mobility    Modified Rankin (Stroke Patients Only)       Balance                                            Cognition Arousal/Alertness: Awake/alert Behavior During Therapy: WFL for tasks assessed/performed Overall Cognitive Status: Within Functional Limits for tasks assessed                                 General Comments: AxO x 3 pleasant and willing.        Exercises      General Comments        Pertinent Vitals/Pain Pain Assessment Pain Assessment: No/denies pain    Home Living                          Prior Function            PT Goals (current goals can now be found in the care plan section) Progress towards PT goals: Progressing toward goals    Frequency    Min 3X/week      PT Plan Current plan remains appropriate  Co-evaluation              AM-PAC PT "6 Clicks" Mobility   Outcome Measure  Help needed turning from your back to your side while in a flat bed without using bedrails?: None Help needed moving from lying on your back to sitting on the side of a flat bed without using bedrails?: None Help needed moving to and from a bed to a chair (including a wheelchair)?: None Help needed standing up from a chair using your arms (e.g., wheelchair or bedside chair)?: None Help needed to walk in hospital room?: A Little Help needed climbing 3-5 steps with a railing? : A Little 6 Click Score: 22    End of Session Equipment Utilized During Treatment: Gait belt Activity Tolerance: Patient tolerated treatment well Patient left: in bed;with call bell/phone within reach Nurse Communication: Mobility status PT Visit Diagnosis: Other abnormalities of gait and mobility (R26.89);Muscle weakness (generalized)  (M62.81)     Time: 1415-1430 PT Time Calculation (min) (ACUTE ONLY): 15 min  Charges:  $Gait Training: 8-22 mins                     Rica Koyanagi  PTA [place] [facility] M-F          [phone] Weekend pager [phone]

## 2022-04-21 NOTE — TOC Progression Note (Addendum)
Transition of Care [facility]) - Progression Note    Patient Details  Name: Carl [name] MRN: [id number] Date of Birth: [birth year]  [facility] CM/SW Contact  Henrietta Dine, RN Phone Number: 04/21/2022, 11:30 AM  Clinical Narrative:    Pt has PICC line placed; plan to d/c home tomorrow w/ IV antibiotics; Carolynn Sayers at [facility] notified; no [facility] listed; spoke w/ Gevena Barre at [facility] and they are not able to provide services to pt d/ t no nursing availability; spoke w/ Carolynn Sayers at [facility] can not fund; Nancy Marus, [facility] supervisor notified.  -1253- notified by Nancy Marus pt will be seen at [facility]  ([facility]) weekly for [employer] care and labs; spoke w/ Mickel Baas, LPN at facility; she requests orders for PICC care, labs needed, and demographic sheet be faxed to [phone]); they will set up appt for pt and contact him; pt notified and says he can be contacted at [phone]; Dr Tawanna Solo notified; awaiting orders before faxing documents.   -1440- documents faxed; electronic confirmation received; attempted to notify facility documents have been faxed; LVM to check for fax [phone]).         Expected Discharge Plan and Services                                               Social Determinants of Health (SDOH) Interventions SDOH Screenings   Food Insecurity: No Food Insecurity (04/17/2022)  Housing: Low Risk  (04/17/2022)  Transportation Needs: No Transportation Needs (04/17/2022)  Utilities: Not At Risk (04/17/2022)  Depression (PHQ2-9): Low Risk  (10/03/2021)  Tobacco Use: Medium Risk (04/20/2022)    Readmission Risk Interventions     No data to display

## 2022-04-22 LAB — CULTURE, BLOOD (ROUTINE X 2)
Culture: NO GROWTH
Culture: NO GROWTH
Special Requests: ADEQUATE
Special Requests: ADEQUATE

## 2022-04-22 LAB — GLUCOSE, CAPILLARY
Glucose-Capillary: 230 mg/dL — ABNORMAL HIGH (ref 70–99)
Glucose-Capillary: 264 mg/dL — ABNORMAL HIGH (ref 70–99)

## 2022-04-22 LAB — BASIC METABOLIC PANEL
Anion gap: 13 (ref 5–15)
BUN: 18 mg/dL (ref 6–20)
CO2: 29 mmol/L (ref 22–32)
Calcium: 7.7 mg/dL — ABNORMAL LOW (ref 8.9–10.3)
Chloride: 94 mmol/L — ABNORMAL LOW (ref 98–111)
Creatinine, Ser: 0.78 mg/dL (ref 0.61–1.24)
GFR, Estimated: >60 mL/min (ref >60)
Glucose, Bld: 213 mg/dL — ABNORMAL HIGH (ref 70–99)
Potassium: 3.9 mmol/L (ref 3.5–5.1)
Sodium: 136 mmol/L (ref 135–145)

## 2022-04-22 MED ORDER — POTASSIUM CHLORIDE CRYS ER 20 MEQ PO TBCR: 20.0000 meq | EXTENDED_RELEASE_TABLET | Freq: Every day | ORAL | 0 refills | Status: DC

## 2022-04-22 MED ORDER — AMLODIPINE BESYLATE 10 MG PO TABS: 10.0000 mg | ORAL_TABLET | Freq: Every day | ORAL | 0 refills | Status: DC

## 2022-04-22 MED ORDER — FUROSEMIDE 40 MG PO TABS: 40.0000 mg | ORAL_TABLET | Freq: Every day | ORAL | 0 refills | Status: DC

## 2022-04-22 NOTE — TOC Transition Note (Signed)
Transition of Care [facility]) - CM/SW Discharge Note   Patient Details  Name: Carl [name] MRN: [id number] Date of Birth: 1965-12-07  Transition of Care [facility]) CM/SW Contact:  Henrietta Dine, RN Phone Number: 04/22/2022, 11:37 AM   Clinical Narrative:    D/C orders received; IV abx per [facility]; spoke w Carolynn Sayers; she says pt needs 2 pm dose before he leaves hospital; she also says [facility] will deliver meds to home this evening to start w/ the 10 pm dose at home; [facility] and Dr Fonnie Jarvis notified; not TOC needs.   Final next level of care: Home w Hospice Care Barriers to Discharge: No Barriers Identified   Patient Goals and CMS Choice      Discharge Placement                         Discharge Plan and Services Additional resources added to the After Visit Summary for                                       Social Determinants of Health (SDOH) Interventions SDOH Screenings   Food Insecurity: No Food Insecurity (04/17/2022)  Housing: Low Risk  (04/17/2022)  Transportation Needs: No Transportation Needs (04/17/2022)  Utilities: Not At Risk (04/17/2022)  Depression (PHQ2-9): Low Risk  (10/03/2021)  Tobacco Use: Medium Risk (04/20/2022)     Readmission Risk Interventions     No data to display

## 2022-04-22 NOTE — Discharge Summary (Signed)
Physician Discharge Summary  ANURAAG TISSOT [id number] DOB: [birth year] DOA: 04/13/2022  PCP: Eppie Gibson, MD  Admit date: 04/13/2022 Discharge date: 04/22/2022  Admitted From: Home Disposition:  Home  Discharge Condition:Stable CODE STATUS:FULL Diet recommendation: Heart Healthy  Brief/Interim Summary: Patient is a 57 year old male with history of diabetes type 2 who presented with 6-day history of nausea, vomiting, diarrhea.  Lab work showed AKI, hyponatremia, elevated liver enzymes.  Hospital course remarkable for finding of MSSA bacteremia, UTI.  ID consulted and following.  Lumbar MRI showed active septic arthritis at L4-L5 with no fluid collection or abscess.  TEE did not show any vegetation, underwent  PICC line placement .Plan for discharge  to home with IV antibiotics for 6 weeks.    Following problems were addressed during the hospitalization:  AKI: Resolved likely from dehydration in the setting of vomiting and diarrhea.  Currently kidney function stable.  IV fluids stopped   MSSA bacteremia/MSSA UTI: Febrile during this hospitalization.  Chest x-ray did not show any acute findings.  No abdominal pain.  No presence of hardware.  Lumbar spine MRI showed acute septic arthritis at L4-L5 without any fluid collection or abscess.  TEE negative for vegetation. Plan for 6 weeks of antibiotics course, underwent PICC line placement   Elevated liver enzymes: No history of heavy alcohol use.  Right upper quadrant ultrasound showed fatty liver. Hepatitis panel negative. Liver enzymes improved and normalized  Acute hypoxic respiratory failure/volume overload:  Chest Xray  negative findings.  Echo without significant findings, showed normal left ventricle ejection fraction, grade 1 diastolic dysfunction.  D-dimer was positive but lower extremity Doppler, VQ scan negative.  Obesity hypoventilation syndrome is a possibility.  Chest x-ray on 2/7 showed  low lung volumes.  Patient looked volume  overloaded with elevated BNP. Started  IV diuresis He might have been given a lot of fluids while he was hypotensive with AKI earlier.  Significantly diuresed.  Currently weaned to room air.  Will continue few days of oral Lasix at home then stop.    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  Hemoglobin A1c of 7.8.  On glipizide, metformin at home.     Normocytic anemia/thrombocytopenia: Currently hemoglobin/platelet count stable.  Thrombocytopenia could be from sepsis/bacteremia.     Hypertension: On Norvasc.     Hypokalemia: Supplemented, continue supplementation for few more days along with Lasix   Hyperlipidemia: On lipitor   Nausea, vomiting, diarrhea: Present on admission.  Resolved .C. difficile, GI pathogen panel negative.   Deconditioning/weakness: PT saw the patient, no follow-up recommended   Morbid obesity: BMI of  40   Discharge Diagnoses:  Principal Problem:   AKI (acute kidney injury) ([place]) Active Problems:   Hyperlipidemia   Hypertension   Type 2 diabetes mellitus ([place])   Hyponatremia   UTI (urinary tract infection)   Nausea and vomiting   Diarrhea   Elevated liver enzymes   Acute lower UTI   Staphylococcus aureus bacteremia   Hypoxia   Thrombocytopenia [facility])    Discharge Instructions  Discharge Instructions     [facility] pharmacist to adjust dose for Vancomycin, Aminoglycosides and other anti-infective therapies as requested by physician.   Complete by: As directed    [facility] to provide Cath Flo 46m   Complete by: As directed    Administer for PICC line occlusion and as ordered by physician for other access device issues.   Anaphylaxis Kit: Provided to treat  any anaphylactic reaction to the medication being provided to the patient if First Dose or when requested by physician   Complete by: As directed    Epinephrine 26m/ml vial / amp: Administer 0.346m(0.54m28msubcutaneously  once for moderate to severe anaphylaxis, nurse to call physician and pharmacy when reaction occurs and call 911 if needed for immediate care   Diphenhydramine 92m82m IV vial: Administer 25-92mg15mIM PRN for first dose reaction, rash, itching, mild reaction, nurse to call physician and pharmacy when reaction occurs   Sodium Chloride 0.9% NS 500ml 21mAdminister if needed for hypovolemic blood pressure drop or as ordered by physician after call to physician with anaphylactic reaction   Change dressing on IV access line weekly and PRN   Complete by: As directed    Diet - low sodium heart healthy   Complete by: As directed    Discharge instructions   Complete by: As directed    1)Please take prescribed medications as instructed. 2)Follow up with your PCP in 1 to 2 weeks.  Do a Bmp test during the follow up 3)Monitor your blood sugars at home   Flush IV access with Sodium Chloride 0.9% and Heparin 10 units/ml or 100 units/ml   Complete by: As directed    Home infusion instructions - [facility]   Complete by: As directed    Instructions: Flush IV access with Sodium Chloride 0.9% and Heparin 10units/ml or 100units/ml   Change dressing on IV access line: Weekly and PRN   Instructions Cath Flo 2mg: A1mnister for PICC Line occlusion and as ordered by physician for other access device   [facility] pharmacist to adjust dose for: Vancomycin, Aminoglycosides and other anti-infective therapies as requested by physician   Increase activity slowly   Complete by: As directed    Method of administration may be changed at the discretion of home infusion pharmacist based upon assessment of the patient and/or caregiver's ability to self-administer the medication ordered   Complete by: As directed    No wound care   Complete by: As directed    Outpatient Parenteral Antibiotic Therapy Information Antibiotic: Cefazolin (Ancef) IVPB; Indications for use: MSSA bacteremia / lumbar discitis;  End Date: 05/28/2022   Complete by: As directed    Antibiotic: Cefazolin (Ancef) IVPB   Indications for use: MSSA bacteremia / lumbar discitis   End Date: 05/28/2022      Allergies as of 04/22/2022       Reactions   Morphine And Related Other (See Comments)   hallucinations        Medication List     STOP taking these medications    losartan 50 MG tablet Commonly known as: COZAAR       TAKE these medications    acetaminophen 325 MG tablet Commonly known as: TYLENOL Take 2 tablets (650 mg total) by mouth every 6 (six) hours as needed for mild pain or fever.   amLODipine 10 MG tablet Commonly known as: NORVASC Take 1 tablet (10 mg total) by mouth daily.   aspirin EC 81 MG tablet Take 1 tablet (81 mg total) by mouth daily. Swallow whole.   atorvastatin 20 MG tablet Commonly known as: LIPITOR Take 1 tablet by mouth once daily   blood glucose meter kit and supplies Kit Dispense based on patient and insurance preference. Use up to four times daily as directed. (FOR ICD-9 250.00, 250.01).   ceFAZolin  IVPB Commonly known as: ANCEF Inject 2 g into the vein  every 8 (eight) hours. Indication:  cefazolin 2g IV q8h First Dose: No Last Day of Therapy:  05/28/2022 Labs - Once weekly:  CBC/D and BMP, Labs - Every other week:  ESR and CRP Method of administration: IV Push Method of administration may be changed at the discretion of home infusion pharmacist based upon assessment of the patient and/or caregiver's ability to self-administer the medication ordered.   furosemide 40 MG tablet Commonly known as: Lasix Take 1 tablet (40 mg total) by mouth daily for 5 days.   glipiZIDE 5 MG 24 hr tablet Commonly known as: GLUCOTROL XL Take 1 tablet (5 mg total) by mouth daily.   metFORMIN 500 MG 24 hr tablet Commonly known as: GLUCOPHAGE-XR TAKE 2 TABLETS BY MOUTH TWICE DAILY WITH A MEAL   multivitamin with minerals Tabs tablet Take 1 tablet by mouth daily.   potassium  chloride SA 20 MEQ tablet Commonly known as: KLOR-CON M Take 1 tablet (20 mEq total) by mouth daily for 5 days.               Discharge Care Instructions  (From admission, onward)           Start     Ordered   04/21/22 0000  Change dressing on IV access line weekly and PRN  (Home infusion instructions - [facility] )        04/21/22 1427            Allergies  Allergen Reactions   Morphine And Related Other (See Comments)    hallucinations    Consultations: ID   Procedures/Studies: [place] EKG SITE RITE  Result Date: 04/20/2022 If Site Rite image not attached, placement could not be confirmed due to current cardiac rhythm.  ECHO TEE  Result Date: 04/20/2022    TRANSESOPHOGEAL ECHO REPORT   Patient Name:   ANUEL QUESNEL Date of Exam: 04/20/2022 Medical Rec #:  [id number]   Height:       70.0 in Accession #:    [id number]  Weight:       279.0 lb Date of Birth:  02-Oct-1965   BSA:          2.405 m Patient Age:    53 years    BP:           123/66 mmHg Patient Gender: M           HR:           78 bpm. Exam Location:  Inpatient Procedure: Transesophageal Echo, Cardiac Doppler, Color Doppler and Saline            Contrast Bubble Study Indications:    Bacteremia  History:        Patient has prior history of Echocardiogram examinations, most                 recent 04/15/2022. Risk Factors:Hypertension, Diabetes,                 Dyslipidemia and Former Smoker.  Sonographer:    Clayton Lefort RDCS (AE) Referring Phys: [id number] Margie Billet PROCEDURE: After discussion of the risks and benefits of a TEE, an informed consent was obtained from the patient. The transesophogeal probe was passed without difficulty through the esophogus of the patient. Local oropharyngeal anesthetic was provided with Cetacaine. Sedation performed by different physician. The patient was monitored while under deep sedation. Anesthestetic sedation was provided intravenously by Anesthesiology: 348.71m of  Propofol, 1054mof Lidocaine. Image quality  was good. The patient developed no complications during the procedure.  IMPRESSIONS  1. Left ventricular ejection fraction, by estimation, is 55 to 60%. The left ventricle has normal function.  2. Right ventricular systolic function is normal. The right ventricular size is normal.  3. Left atrial size was mildly dilated. No left atrial/left atrial appendage thrombus was detected.  4. The mitral valve is grossly normal. Trivial mitral valve regurgitation.  5. The aortic valve is tricuspid. Aortic valve regurgitation is not visualized.  6. Agitated saline contrast bubble study was negative, with no evidence of any interatrial shunt. Conclusion(s)/Recommendation(s): No evidence of vegetation/infective endocarditis on this transesophageael echocardiogram. FINDINGS  Left Ventricle: Left ventricular ejection fraction, by estimation, is 55 to 60%. The left ventricle has normal function. The left ventricular internal cavity size was normal in size. There is no left ventricular hypertrophy. Right Ventricle: The right ventricular size is normal. No increase in right ventricular wall thickness. Right ventricular systolic function is normal. Left Atrium: Left atrial size was mildly dilated. No left atrial/left atrial appendage thrombus was detected. Right Atrium: Right atrial size was normal in size. Pericardium: There is no evidence of pericardial effusion. Mitral Valve: The mitral valve is grossly normal. Trivial mitral valve regurgitation. Tricuspid Valve: The tricuspid valve is grossly normal. Tricuspid valve regurgitation is trivial. Aortic Valve: The aortic valve is tricuspid. Aortic valve regurgitation is not visualized. Pulmonic Valve: The pulmonic valve was grossly normal. Pulmonic valve regurgitation is trivial. Aorta: The aortic root and ascending aorta are structurally normal, with no evidence of dilitation. IAS/Shunts: The interatrial septum is aneurysmal. No atrial level  shunt detected by color flow Doppler. Agitated saline contrast was given intravenously to evaluate for intracardiac shunting. Agitated saline contrast bubble study was negative, with no evidence of any interatrial shunt. Lyman Bishop MD Electronically signed by Lyman Bishop MD Signature Date/Time: 04/20/2022/10:48:23 AM    Final    DG CHEST PORT 1 VIEW  Result Date: 04/19/2022 CLINICAL DATA:  Hypoxia EXAM: PORTABLE CHEST 1 VIEW COMPARISON:  None Available. FINDINGS: Stable large cardiac silhouette. Low lung volumes. No effusion infiltrate or pneumothorax. No acute osseous abnormality. IMPRESSION: Low lung volumes. No acute cardiopulmonary process. Electronically Signed   By: Suzy Bouchard M.D.   On: 04/19/2022 09:57   MR Lumbar Spine W Wo Contrast  Result Date: 04/18/2022 CLINICAL DATA:  Initial evaluation for MSSA bacteremia. EXAM: MRI LUMBAR SPINE WITHOUT AND WITH CONTRAST TECHNIQUE: Multiplanar and multiecho pulse sequences of the lumbar spine were obtained without and with intravenous contrast. CONTRAST:  50m GADAVIST GADOBUTROL 1 MMOL/ML IV SOLN COMPARISON:  Prior radiograph from 01/14/2010. FINDINGS: Segmentation: Standard. Lowest well-formed disc space labeled the L5-S1 level. Alignment: Physiologic with preservation of the normal lumbar lordosis. No listhesis. Vertebrae: Vertebral body height maintained without acute or chronic fracture. Bone marrow signal intensity somewhat diffusely decreased on T1 weighted imaging, nonspecific, but most commonly related to anemia, smoking, or obesity. Few small benign hemangiomata noted. No worrisome osseous lesions. Reactive marrow edema and enhancement seen about the L4-5 facets bilaterally. Finding is nonspecific, and could be related to degenerative facet arthritis. Possible changes of septic arthritis not excluded. No other evidence for osteomyelitis discitis or septic arthritis elsewhere within the lumbar spine. Conus medullaris and cauda equina: Conus  extends to the T12-L1 level. Conus and cauda equina appear normal. No epidural abscess or other collection. Paraspinal and other soft tissues: Paraspinous edema and enhancement adjacent to the L4-5 facets bilaterally. No drainable soft tissue collections. Small volume  edema/free fluid noted within the presacral space as well. Disc levels: L1-2: Normal interspace. Mild left greater than right facet hypertrophy. No canal or foraminal stenosis. L2-3: Disc desiccation without significant disc bulge. Mild bilateral facet hypertrophy. No canal or foraminal stenosis. L3-4: Disc desiccation with mild diffuse disc bulge. Mild facet and ligament flavum hypertrophy. No significant spinal stenosis. Foramina remain patent. L4-5: Disc desiccation with mild disc bulge. Moderate bilateral facet hypertrophy with associated small joint effusions, reactive marrow edema, and enhancement. Resultant mild bilateral subarticular stenosis. Central canal remains patent. Foramina remain patent as well. L5-S1: Negative interspace. Mild bilateral facet hypertrophy. Mild epidural lipomatosis. No canal or foraminal stenosis. IMPRESSION: 1. Reactive marrow edema and enhancement about the L4-5 facets bilaterally. Given the provided history of bacteremia, changes are concerning for possible acute septic arthritis. Correlation with laboratory values and symptomatology recommended. No abscess or drainable fluid collection. 2. No other evidence for acute infection within the lumbar spine. 3. Mild disc bulge with facet hypertrophy at L4-5 with resultant mild bilateral subarticular stenosis. 4. Additional mild noncompressive disc bulging elsewhere within the lumbar spine without significant stenosis or neural impingement. Electronically Signed   By: Jeannine Boga M.D.   On: 04/18/2022 21:50   MR THORACIC SPINE W WO CONTRAST  Result Date: 04/18/2022 CLINICAL DATA:  Initial evaluation for MSSA bacteremia. EXAM: MRI THORACIC WITHOUT AND WITH  CONTRAST TECHNIQUE: Multiplanar and multiecho pulse sequences of the thoracic spine were obtained without and with intravenous contrast. CONTRAST:  94m GADAVIST GADOBUTROL 1 MMOL/ML IV SOLN COMPARISON:  None Available. FINDINGS: Alignment: Mild levoscoliosis. Alignment otherwise normal preservation of the normal lumbar lordosis. No listhesis. Vertebrae: Vertebral body height maintained without acute or chronic fracture. 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 Diffuse loss of normal bone marrow signal, nonspecific but can be seen with anemia, smoking, obesity, and infiltrative/myelofibrotic marrow processes. No discrete or worrisome osseous lesions. No abnormal marrow edema or enhancement. No evidence for osteomyelitis discitis or septic arthritis. Cord: Normal signal. Subtle flattening of the posterior margin of the midthoracic cord at the level of T7 (series 25, image 11), nonspecific, but could be related to an underlying intradural arachnoid cyst or web. No epidural abscess or other evidence for intracanalicular infection. Paraspinal and other soft tissues: Paraspinous soft tissues demonstrate no acute finding. Small layering bilateral pleural effusions noted. Disc levels: T1-2: Negative interspace. Mild left-sided facet hypertrophy. No stenosis. T2-3: Segmental fusion of T2 and T3.  No stenosis. T3-4: Mild disc bulge. Mild left-sided facet hypertrophy. No spinal stenosis. Foramina remain patent. T4-5: Mild disc bulge with small right paracentral disc protrusion. Mild posterior element hypertrophy. Minimal flattening of the ventral cord without cord signal changes. Mild prominence of the dorsal epidural fat. No significant spinal stenosis. Foramina remain patent. T5-6: Tiny central disc protrusion minimally indents the ventral thecal sac. Mild facet hypertrophy. Prominence of the dorsal epidural fat. No stenosis. T6-7: Small chronic endplate Schmorl's node deformities without significant disc bulge. Mild  posterior element hypertrophy with prominence of the dorsal epidural fat. No stenosis. T7-8: Small left paracentral disc protrusion with annular fissure indents the left ventral thecal sac (series 22, image 25). Flattening of the posterior margin of the cord at this level. No cord signal changes. Mild prominence of the dorsal epidural fat. No significant spinal stenosis. Foramina remain patent. T8-9: Small chronic endplate Schmorl's node deformities. Small left paracentral disc protrusion indents the left ventral thecal sac. Prominence of the dorsal epidural fat. No spinal stenosis. Foramina remain patent.  T9-10: Small chronic endplate Schmorl's node deformities without significant disc bulge. Mild facet hypertrophy. No canal or foraminal stenosis. T10-11: Small chronic endplate Schmorl's node deformities without significant disc bulge. Mild facet hypertrophy. No stenosis. T11-12: Small chronic endplate Schmorl's node deformities without significant disc bulge. No stenosis. T12-L1: Normal interspace. Mild left greater than right facet hypertrophy. No stenosis. IMPRESSION: 1. No MRI evidence for acute infection within the thoracic spine. 2. Underlying mild multilevel degenerative spondylosis and facet hypertrophy as above. No significant stenosis or neural impingement. 3. Small layering bilateral pleural effusions. Electronically Signed   By: Jeannine Boga M.D.   On: 04/18/2022 21:37   DG CHEST PORT 1 VIEW  Result Date: 04/15/2022 CLINICAL DATA:  Found down, fever EXAM: PORTABLE CHEST 1 VIEW COMPARISON:  04/14/2022 FINDINGS: Single frontal view of the chest demonstrates stable enlargement of the cardiac silhouette. Lung volumes are diminished, with crowding the central vasculature unchanged. No airspace disease, effusion, or pneumothorax. IMPRESSION: 1. Stable enlarged cardiac silhouette and central vascular congestion. No acute airspace disease. Electronically Signed   By: Randa Ngo M.D.   On:  04/15/2022 17:27   ECHOCARDIOGRAM COMPLETE  Result Date: 04/15/2022    ECHOCARDIOGRAM REPORT   Patient Name:   HUDSEN SECHLER Date of Exam: 04/15/2022 Medical Rec #:  [id number]   Height:       70.0 in Accession #:    [id number]  Weight:       279.0 lb Date of Birth:  [birth year]   BSA:          2.405 m Patient Age:    32 years    BP:           157/82 mmHg Patient Gender: M           HR:           97 bpm. Exam Location:  Inpatient Procedure: 2D Echo and Intracardiac Opacification Agent Indications:    Hypoxia  History:        Patient has no prior history of Echocardiogram examinations.                 Risk Factors:Hypertension, Diabetes and Dyslipidemia.  Sonographer:    Harvie Junior Referring Phys: [place]  Sonographer Comments: Technically difficult study due to poor echo windows and patient is obese. Image acquisition challenging due to patient body habitus and Image acquisition challenging due to respiratory motion. IMPRESSIONS  1. Left ventricular ejection fraction, by estimation, is 55 to 60%. Left ventricular ejection fraction by PLAX is 56 %. The left ventricle has normal function. The left ventricle has no regional wall motion abnormalities. There is mild left ventricular hypertrophy. Left ventricular diastolic parameters are consistent with Grade I diastolic dysfunction (impaired relaxation).  2. Right ventricular systolic function is normal. The right ventricular size is normal. There is normal pulmonary artery systolic pressure. The estimated right ventricular systolic pressure is 123XX123 mmHg.  3. Left atrial size was mildly dilated.  4. The mitral valve is abnormal. Trivial mitral valve regurgitation.  5. The aortic valve is tricuspid. Aortic valve regurgitation is not visualized. Aortic valve sclerosis is present, with no evidence of aortic valve stenosis.  6. The inferior vena cava is normal in size with greater than 50% respiratory variability, suggesting right atrial pressure of 3 mmHg.  Comparison(s): No prior Echocardiogram. FINDINGS  Left Ventricle: Left ventricular ejection fraction, by estimation, is 55 to 60%. Left ventricular ejection fraction by PLAX is 56 %. The left  ventricle has normal function. The left ventricle has no regional wall motion abnormalities. The left ventricular internal cavity size was normal in size. There is mild left ventricular hypertrophy. Left ventricular diastolic parameters are consistent with Grade I diastolic dysfunction (impaired relaxation). Indeterminate filling pressures. Right Ventricle: The right ventricular size is normal. No increase in right ventricular wall thickness. Right ventricular systolic function is normal. There is normal pulmonary artery systolic pressure. The tricuspid regurgitant velocity is 1.95 m/s, and  with an assumed right atrial pressure of 3 mmHg, the estimated right ventricular systolic pressure is 123XX123 mmHg. Left Atrium: Left atrial size was mildly dilated. Right Atrium: Right atrial size was normal in size. Pericardium: There is no evidence of pericardial effusion. Mitral Valve: The mitral valve is abnormal. There is mild thickening of the mitral valve leaflet(s). Trivial mitral valve regurgitation. Tricuspid Valve: The tricuspid valve is grossly normal. Tricuspid valve regurgitation is trivial. Aortic Valve: The aortic valve is tricuspid. Aortic valve regurgitation is not visualized. Aortic valve sclerosis is present, with no evidence of aortic valve stenosis. Aortic valve mean gradient measures 4.0 mmHg. Aortic valve peak gradient measures 7.2  mmHg. Aortic valve area, by VTI measures 3.38 cm. Pulmonic Valve: The pulmonic valve was normal in structure. Pulmonic valve regurgitation is not visualized. Aorta: The aortic root and ascending aorta are structurally normal, with no evidence of dilitation. Venous: The inferior vena cava is normal in size with greater than 50% respiratory variability, suggesting right atrial pressure of 3  mmHg. IAS/Shunts: No atrial level shunt detected by color flow Doppler.  LEFT VENTRICLE PLAX 2D LV EF:         Left            Diastology                ventricular     LV e' medial:    8.38 cm/s                ejection        LV E/e' medial:  10.5                fraction by     LV e' lateral:   12.30 cm/s                PLAX is 56      LV E/e' lateral: 7.2                %. LVIDd:         5.10 cm LVIDs:         3.60 cm LV PW:         1.20 cm LV IVS:        1.20 cm LVOT diam:     2.10 cm LV SV:         82 LV SV Index:   34 LVOT Area:     3.46 cm  LV Volumes (MOD) LV vol d, MOD    146.0 ml A2C: LV vol d, MOD    166.0 ml A4C: LV vol s, MOD    68.1 ml A2C: LV vol s, MOD    72.9 ml A4C: LV SV MOD A2C:   77.9 ml LV SV MOD A4C:   166.0 ml LV SV MOD BP:    85.1 ml RIGHT VENTRICLE RV Basal diam:  3.40 cm RV Mid diam:    3.40 cm RV S prime:     14.90 cm/s TAPSE (M-mode): 2.4 cm  LEFT ATRIUM             Index        RIGHT ATRIUM           Index LA diam:        4.10 cm 1.71 cm/m   RA Area:     17.90 cm LA Vol (A2C):   78.7 ml 32.73 ml/m  RA Volume:   41.40 ml  17.22 ml/m LA Vol (A4C):   91.2 ml 37.93 ml/m LA Biplane Vol: 85.1 ml 35.39 ml/m  AORTIC VALVE                    PULMONIC VALVE AV Area (Vmax):    2.79 cm     PV Vmax:       1.09 m/s AV Area (Vmean):   2.70 cm     PV Peak grad:  4.8 mmHg AV Area (VTI):     3.38 cm AV Vmax:           134.00 cm/s AV Vmean:          97.400 cm/s AV VTI:            0.242 m AV Peak Grad:      7.2 mmHg AV Mean Grad:      4.0 mmHg LVOT Vmax:         108.00 cm/s LVOT Vmean:        76.000 cm/s LVOT VTI:          0.236 m LVOT/AV VTI ratio: 0.98  AORTA Ao Root diam: 3.30 cm Ao Asc diam:  3.50 cm MITRAL VALVE               TRICUSPID VALVE MV Area (PHT): 5.02 cm    TR Peak grad:   15.2 mmHg MV Decel Time: 151 msec    TR Vmax:        195.00 cm/s MV E velocity: 88.30 cm/s MV A velocity: 93.80 cm/s  SHUNTS MV E/A ratio:  0.94        Systemic VTI:  0.24 m                            Systemic  Diam: 2.10 cm Lyman Bishop MD Electronically signed by Lyman Bishop MD Signature Date/Time: 04/15/2022/12:12:59 PM    Final    VAS [place] LOWER EXTREMITY VENOUS (DVT)  Result Date: 04/15/2022  Lower Venous DVT Study Patient Name:  DENTRELL COHEE  Date of Exam:   04/14/2022 Medical Rec #: [id number]    Accession #:    [id number] Date of Birth: Jul 30, 1965    Patient Gender: M Patient Age:   68 years Exam Location:  [facility] Procedure:      VAS [place] LOWER EXTREMITY VENOUS (DVT) Referring Phys: Murray Hodgkins --------------------------------------------------------------------------------  Indications: Hypoxia.  Risk Factors: None identified. Limitations: Body habitus and poor ultrasound/tissue interface. Comparison Study: No prior studies. Performing Technologist: Oliver Hum RVT  Examination Guidelines: A complete evaluation includes B-mode imaging, spectral Doppler, color Doppler, and power Doppler as needed of all accessible portions of each vessel. Bilateral testing is considered an integral part of a complete examination. Limited examinations for reoccurring indications may be performed as noted. The reflux portion of the exam is performed with the patient in reverse Trendelenburg.  +---------+---------------+---------+-----------+----------+--------------+ RIGHT    CompressibilityPhasicitySpontaneityPropertiesThrombus Aging +---------+---------------+---------+-----------+----------+--------------+ CFV      Full  Yes      Yes                                 +---------+---------------+---------+-----------+----------+--------------+ SFJ      Full                                                        +---------+---------------+---------+-----------+----------+--------------+ FV Prox  Full                                                        +---------+---------------+---------+-----------+----------+--------------+ FV Mid   Full                                                         +---------+---------------+---------+-----------+----------+--------------+ FV DistalFull                                                        +---------+---------------+---------+-----------+----------+--------------+ PFV      Full                                                        +---------+---------------+---------+-----------+----------+--------------+ POP      Full           Yes      Yes                                 +---------+---------------+---------+-----------+----------+--------------+ PTV      Full                                                        +---------+---------------+---------+-----------+----------+--------------+ PERO     Full                                                        +---------+---------------+---------+-----------+----------+--------------+   +---------+---------------+---------+-----------+----------+-------------------+ LEFT     CompressibilityPhasicitySpontaneityPropertiesThrombus Aging      +---------+---------------+---------+-----------+----------+-------------------+ CFV      Full           Yes      Yes                                      +---------+---------------+---------+-----------+----------+-------------------+  SFJ      Full                                                             +---------+---------------+---------+-----------+----------+-------------------+ FV Prox  Full                                                             +---------+---------------+---------+-----------+----------+-------------------+ FV Mid   Full                                                             +---------+---------------+---------+-----------+----------+-------------------+ FV DistalFull                                                             +---------+---------------+---------+-----------+----------+-------------------+ PFV      Full                                                              +---------+---------------+---------+-----------+----------+-------------------+ POP      Full           Yes      Yes                                      +---------+---------------+---------+-----------+----------+-------------------+ PTV      Full                                                             +---------+---------------+---------+-----------+----------+-------------------+ PERO                                                  Not well visualized +---------+---------------+---------+-----------+----------+-------------------+     Summary: RIGHT: - There is no evidence of deep vein thrombosis in the lower extremity. However, portions of this examination were limited- see technologist comments above.  - No cystic structure found in the popliteal fossa.  LEFT: - There is no evidence of deep vein thrombosis in the lower extremity. However, portions of this examination were limited- see technologist comments above.  - No cystic structure found in the popliteal fossa.  *See table(s) above for measurements and observations. Electronically  signed by Orlie Pollen on 04/15/2022 at 10:44:39 AM.    Final    NM Pulmonary Perfusion  Result Date: 04/14/2022 CLINICAL DATA:  Rule out pulmonary embolism. Low to intermediate probability. New hypoxia with elevated D-dimer. EXAM: NUCLEAR MEDICINE PERFUSION LUNG SCAN TECHNIQUE: Perfusion images were obtained in multiple projections after intravenous injection of radiopharmaceutical. Ventilation scans intentionally deferred if perfusion scan and chest x-ray adequate for interpretation during COVID 19 epidemic. RADIOPHARMACEUTICALS:  4.4 mCi Tc-48mMAA IV COMPARISON:  Chest radiograph 04/14/2022 FINDINGS: There is a uniform distribution of the radiopharmaceutical throughout both lungs. No segmental perfusion defects identified to suggest acute pulmonary embolism. IMPRESSION: No evidence for acute pulmonary  embolus. Electronically Signed   By: TKerby MoorsM.D.   On: 04/14/2022 17:06   [place]RENAL  Result Date: 04/14/2022 CLINICAL DATA:  Acute kidney injury EXAM: RENAL / URINARY TRACT ULTRASOUND COMPLETE COMPARISON:  None Available. FINDINGS: Right Kidney: Renal measurements: 14.4 x 7.1 x 7.6 cm = volume: 407.6 mL. Echogenicity within normal limits. No mass or hydronephrosis visualized. Left Kidney: Renal measurements: 13.4 x 6.2 x 7.3 cm = volume: 580.6 mL. Echogenicity within normal limits. No mass or hydronephrosis visualized. Bladder: Appears normal for degree of bladder distention. Other: Limited study due to labored breathing and patient body habitus. IMPRESSION: No hydronephrosis. Electronically Signed   By: LYetta GlassmanM.D.   On: 04/14/2022 14:19   DG CHEST PORT 1 VIEW  Result Date: 04/14/2022 CLINICAL DATA:  Acute hypoxic respiratory failure EXAM: PORTABLE CHEST 1 VIEW COMPARISON:  02/19/2011 FINDINGS: Cardiomegaly and vascular pedicle widening accentuated by low volumes. Central vessel congestion without edema. No consolidation, effusion, or pneumothorax. IMPRESSION: Cardiomegaly and vascular congestion accentuated by low lung volumes. No edema or pneumonia. Electronically Signed   By: JJorje GuildM.D.   On: 04/14/2022 06:01   [place]Abdomen Limited RUQ (LIVER/GB)  Result Date: 04/14/2022 CLINICAL DATA:  Nausea and vomiting for several days EXAM: ULTRASOUND ABDOMEN LIMITED RIGHT UPPER QUADRANT COMPARISON:  None Available. FINDINGS: Gallbladder: No gallstones or wall thickening visualized. No sonographic Murphy sign noted by sonographer. Common bile duct: Diameter: 3 mm Liver: Increased in echogenicity consistent with fatty infiltration. No focal mass is noted. Portal vein is patent on color Doppler imaging with normal direction of blood flow towards the liver. Other: None. IMPRESSION: Fatty liver without acute abnormality. Electronically Signed   By: MInez CatalinaM.D.   On: 04/14/2022 00:52       Subjective: Patient seen and examined at bedside today.  Hemodynamically stable for discharge.  Discharge Exam: Vitals:   04/22/22 0750 04/22/22 0855  BP: 95/65 132/69  Pulse: 82 81  Resp: 20   Temp: 97.7 F (36.5 C)   SpO2: 98%    Vitals:   04/21/22 2103 04/22/22 0650 04/22/22 0750 04/22/22 0855  BP: 123/70 104/65 95/65 132/69  Pulse: 89 86 82 81  Resp: 16 14 20   $ Temp: 98.4 F (36.9 C) 98.2 F (36.8 C) 97.7 F (36.5 C)   TempSrc: Oral Oral Oral   SpO2: 93% 97% 98%   Weight:      Height:        General: Pt is alert, awake, not in acute distress, obese Cardiovascular: RRR, S1/S2 +, no rubs, no gallops Respiratory: CTA bilaterally, no wheezing, no rhonchi Abdominal: Soft, NT, ND, bowel sounds + Extremities: no edema, no cyanosis    The results of significant diagnostics from this hospitalization (including imaging, microbiology, ancillary and laboratory) are listed below for  reference.     Microbiology: Recent Results (from the past 240 hour(s))  Resp panel by RT-PCR (RSV, Flu A&B, Covid) Anterior Nasal Swab     Status: None   Collection Time: 04/13/22 10:26 PM   Specimen: Anterior Nasal Swab  Result Value Ref Range Status   SARS Coronavirus 2 by RT PCR NEGATIVE NEGATIVE Final    Comment: (NOTE) SARS-CoV-2 target nucleic acids are NOT DETECTED.  The SARS-CoV-2 RNA is generally detectable in upper respiratory specimens during the acute phase of infection. The lowest concentration of SARS-CoV-2 viral copies this assay can detect is 138 copies/mL. A negative result does not preclude SARS-Cov-2 infection and should not be used as the sole basis for treatment or other patient management decisions. A negative result may occur with  improper specimen collection/handling, submission of specimen other than nasopharyngeal swab, presence of viral mutation(s) within the areas targeted by this assay, and inadequate number of viral copies(<138 copies/mL). A negative  result must be combined with clinical observations, patient history, and epidemiological information. The expected result is Negative.  Fact Sheet for Patients:  EntrepreneurPulse.com.au  Fact Sheet for Healthcare Providers:  IncredibleEmployment.be  This test is no t yet approved or cleared by the [place] FDA and  has been authorized for detection and/or diagnosis of SARS-CoV-2 by FDA under an Emergency Use Authorization (EUA). This EUA will remain  in effect (meaning this test can be used) for the duration of the COVID-19 declaration under Section 564(b)(1) of the Act, 21 U.S.C.section 360bbb-3(b)(1), unless the authorization is terminated  or revoked sooner.       Influenza A by PCR NEGATIVE NEGATIVE Final   Influenza B by PCR NEGATIVE NEGATIVE Final    Comment: (NOTE) The Xpert Xpress SARS-CoV-2/FLU/RSV plus assay is intended as an aid in the diagnosis of influenza from Nasopharyngeal swab specimens and should not be used as a sole basis for treatment. Nasal washings and aspirates are unacceptable for Xpert Xpress SARS-CoV-2/FLU/RSV testing.  Fact Sheet for Patients: EntrepreneurPulse.com.au  Fact Sheet for Healthcare Providers: IncredibleEmployment.be  This test is not yet approved or cleared by the [place] FDA and has been authorized for detection and/or diagnosis of SARS-CoV-2 by FDA under an Emergency Use Authorization (EUA). This EUA will remain in effect (meaning this test can be used) for the duration of the COVID-19 declaration under Section 564(b)(1) of the Act, 21 U.S.C. section 360bbb-3(b)(1), unless the authorization is terminated or revoked.     Resp Syncytial Virus by PCR NEGATIVE NEGATIVE Final    Comment: (NOTE) Fact Sheet for Patients: EntrepreneurPulse.com.au  Fact Sheet for Healthcare Providers: IncredibleEmployment.be  This  test is not yet approved or cleared by the [place] FDA and has been authorized for detection and/or diagnosis of SARS-CoV-2 by FDA under an Emergency Use Authorization (EUA). This EUA will remain in effect (meaning this test can be used) for the duration of the COVID-19 declaration under Section 564(b)(1) of the Act, 21 U.S.C. section 360bbb-3(b)(1), unless the authorization is terminated or revoked.  Performed at [facility], [place] [address], [place], [place] [address]   Urine Culture (for pregnant, neutropenic or urologic patients or patients with an indwelling urinary catheter)     Status: Abnormal   Collection Time: 04/14/22  4:34 AM   Specimen: Urine, Clean Catch  Result Value Ref Range Status   Specimen Description   Final    URINE, CLEAN CATCH Performed at [facility], [place] [address] [place], [place]  [id number]    Special Requests   Final    NONE Performed at [facility], [place] [address], [place], [place] [address]    Culture >=100,000 COLONIES/mL STAPHYLOCOCCUS AUREUS (A)  Final   Report Status 04/16/2022 FINAL  Final   Organism ID, Bacteria STAPHYLOCOCCUS AUREUS (A)  Final      Susceptibility   Staphylococcus aureus - MIC*    CIPROFLOXACIN <=0.5 SENSITIVE Sensitive     GENTAMICIN <=0.5 SENSITIVE Sensitive     NITROFURANTOIN 32 SENSITIVE Sensitive     OXACILLIN 0.5 SENSITIVE Sensitive     TETRACYCLINE <=1 SENSITIVE Sensitive     VANCOMYCIN 1 SENSITIVE Sensitive     TRIMETH/SULFA <=10 SENSITIVE Sensitive     CLINDAMYCIN RESISTANT Resistant     RIFAMPIN <=0.5 SENSITIVE Sensitive     Inducible Clindamycin POSITIVE Resistant     * >=100,000 COLONIES/mL STAPHYLOCOCCUS AUREUS  C Difficile Quick Screen w PCR reflex     Status: None   Collection Time: 04/14/22  6:40 AM   Specimen: STOOL  Result Value Ref Range Status   C Diff antigen NEGATIVE NEGATIVE Final   C Diff toxin NEGATIVE NEGATIVE Final   C Diff  interpretation No C. difficile detected.  Final    Comment: Performed at [facility], [place] [address] [place], [place] [address]  Gastrointestinal Panel by PCR , Stool     Status: None   Collection Time: 04/14/22  6:40 AM   Specimen: STOOL  Result Value Ref Range Status   Campylobacter species NOT DETECTED NOT DETECTED Final   Plesimonas shigelloides NOT DETECTED NOT DETECTED Final   Salmonella species NOT DETECTED NOT DETECTED Final   Yersinia enterocolitica NOT DETECTED NOT DETECTED Final   Vibrio species NOT DETECTED NOT DETECTED Final   Vibrio cholerae NOT DETECTED NOT DETECTED Final   Enteroaggregative E coli (EAEC) NOT DETECTED NOT DETECTED Final   Enteropathogenic E coli (EPEC) NOT DETECTED NOT DETECTED Final   Enterotoxigenic E coli (ETEC) NOT DETECTED NOT DETECTED Final   Shiga like toxin producing E coli (STEC) NOT DETECTED NOT DETECTED Final   Shigella/Enteroinvasive E coli (EIEC) NOT DETECTED NOT DETECTED Final   Cryptosporidium NOT DETECTED NOT DETECTED Final   Cyclospora cayetanensis NOT DETECTED NOT DETECTED Final   Entamoeba histolytica NOT DETECTED NOT DETECTED Final   Giardia lamblia NOT DETECTED NOT DETECTED Final   Adenovirus F40/41 NOT DETECTED NOT DETECTED Final   Astrovirus NOT DETECTED NOT DETECTED Final   Norovirus GI/GII NOT DETECTED NOT DETECTED Final   Rotavirus A NOT DETECTED NOT DETECTED Final   Sapovirus (I, II, IV, and V) NOT DETECTED NOT DETECTED Final    Comment: Performed at [facility], [address] [place], [place] [address]  Culture, blood (Routine X 2) w Reflex to ID Panel     Status: Abnormal   Collection Time: 04/15/22  4:21 PM   Specimen: BLOOD LEFT HAND  Result Value Ref Range Status   Specimen Description   Final    BLOOD LEFT HAND BOTTLES DRAWN AEROBIC AND ANAEROBIC Performed at [facility], [place] [address] [place],  [address]    Special Requests   Final    Blood Culture  adequate volume Performed at [place] [address] [place], [place] [address]    Culture  Setup Time   Final    IN BOTH AEROBIC AND ANAEROBIC BOTTLES GRAM POSITIVE COCCI IN CLUSTERS CRITICAL RESULT CALLED TO, READ BACK  BY AND VERIFIED WITH:  C/ PHARMD E. WILLIAMSON 04/16/22 1307 A. LAFRANCE Performed at [facility], [place] [address] [place], [place] [address]    Culture STAPHYLOCOCCUS AUREUS (A)  Final   Report Status 04/18/2022 FINAL  Final   Organism ID, Bacteria STAPHYLOCOCCUS AUREUS  Final      Susceptibility   Staphylococcus aureus - MIC*    CIPROFLOXACIN <=0.5 SENSITIVE Sensitive     ERYTHROMYCIN RESISTANT Resistant     GENTAMICIN <=0.5 SENSITIVE Sensitive     OXACILLIN 0.5 SENSITIVE Sensitive     TETRACYCLINE <=1 SENSITIVE Sensitive     VANCOMYCIN 1 SENSITIVE Sensitive     TRIMETH/SULFA <=10 SENSITIVE Sensitive     CLINDAMYCIN RESISTANT Resistant     RIFAMPIN <=0.5 SENSITIVE Sensitive     Inducible Clindamycin POSITIVE Resistant     * STAPHYLOCOCCUS AUREUS  Blood Culture ID Panel (Reflexed)     Status: Abnormal   Collection Time: 04/15/22  4:21 PM  Result Value Ref Range Status   Enterococcus faecalis NOT DETECTED NOT DETECTED Final   Enterococcus Faecium NOT DETECTED NOT DETECTED Final   Listeria monocytogenes NOT DETECTED NOT DETECTED Final   Staphylococcus species DETECTED (A) NOT DETECTED Final    Comment: CRITICAL RESULT CALLED TO, READ BACK BY AND VERIFIED WITH:  C/ PHARMD E. WILLIAMSON 04/16/22 1307 A. LAFRANCE    Staphylococcus aureus (BCID) DETECTED (A) NOT DETECTED Final    Comment: CRITICAL RESULT CALLED TO, READ BACK BY AND VERIFIED WITH:  C/ PHARMD E. WILLIAMSON 04/16/22 1307 A. LAFRANCE    Staphylococcus epidermidis NOT DETECTED NOT DETECTED Final   Staphylococcus lugdunensis NOT DETECTED NOT DETECTED Final   Streptococcus species NOT DETECTED NOT DETECTED Final   Streptococcus agalactiae NOT DETECTED NOT DETECTED Final    Streptococcus pneumoniae NOT DETECTED NOT DETECTED Final   Streptococcus pyogenes NOT DETECTED NOT DETECTED Final   A.calcoaceticus-baumannii NOT DETECTED NOT DETECTED Final   Bacteroides fragilis NOT DETECTED NOT DETECTED Final   Enterobacterales NOT DETECTED NOT DETECTED Final   Enterobacter cloacae complex NOT DETECTED NOT DETECTED Final   Escherichia coli NOT DETECTED NOT DETECTED Final   Klebsiella aerogenes NOT DETECTED NOT DETECTED Final   Klebsiella oxytoca NOT DETECTED NOT DETECTED Final   Klebsiella pneumoniae NOT DETECTED NOT DETECTED Final   Proteus species NOT DETECTED NOT DETECTED Final   Salmonella species NOT DETECTED NOT DETECTED Final   Serratia marcescens NOT DETECTED NOT DETECTED Final   Haemophilus influenzae NOT DETECTED NOT DETECTED Final   Neisseria meningitidis NOT DETECTED NOT DETECTED Final   Pseudomonas aeruginosa NOT DETECTED NOT DETECTED Final   Stenotrophomonas maltophilia NOT DETECTED NOT DETECTED Final   Candida albicans NOT DETECTED NOT DETECTED Final   Candida auris NOT DETECTED NOT DETECTED Final   Candida glabrata NOT DETECTED NOT DETECTED Final   Candida krusei NOT DETECTED NOT DETECTED Final   Candida parapsilosis NOT DETECTED NOT DETECTED Final   Candida tropicalis NOT DETECTED NOT DETECTED Final   Cryptococcus neoformans/gattii NOT DETECTED NOT DETECTED Final   Meth resistant mecA/C and MREJ NOT DETECTED NOT DETECTED Final    Comment: Performed at [facility], [address] [place], [place] [address]  Culture, blood (Routine X 2) w Reflex to ID Panel     Status: Abnormal   Collection Time: 04/15/22  4:29 PM   Specimen: BLOOD RIGHT HAND  Result Value Ref Range Status   Specimen Description   Final    BLOOD RIGHT HAND BOTTLES  DRAWN AEROBIC ONLY Performed at [place] [address], [place], [place] [address]    Special Requests   Final    Blood Culture results may not be optimal due to an inadequate volume  of blood received in culture bottles Performed at [place] [address] [place], [place] [address]    Culture  Setup Time   Final    AEROBIC BOTTLE ONLY GRAM POSITIVE COCCI IN CLUSTERS CRITICAL VALUE NOTED.  VALUE IS CONSISTENT WITH PREVIOUSLY REPORTED AND CALLED VALUE.    Culture (A)  Final    STAPHYLOCOCCUS AUREUS SUSCEPTIBILITIES PERFORMED ON PREVIOUS CULTURE WITHIN THE LAST 5 DAYS. Performed at [facility], [place] [address] [place], [place] [address]    Report Status 04/18/2022 FINAL  Final  Culture, blood (Routine X 2) w Reflex to ID Panel     Status: None   Collection Time: 04/17/22  5:47 AM   Specimen: BLOOD  Result Value Ref Range Status   Specimen Description   Final    BLOOD BLOOD LEFT ARM Performed at [place] [address] [place], [place] [address]    Special Requests   Final    BOTTLES DRAWN AEROBIC AND ANAEROBIC Blood Culture adequate volume Performed at [place] [address], [place], [place] [address]    Culture   Final    NO GROWTH 5 DAYS Performed at [facility], [place] [address] [place], [place] [address]    Report Status 04/22/2022 FINAL  Final  Culture, blood (Routine X 2) w Reflex to ID Panel     Status: None   Collection Time: 04/17/22  5:47 AM   Specimen: BLOOD  Result Value Ref Range Status   Specimen Description   Final    BLOOD BLOOD LEFT HAND Performed at [place] [address] [place], [place] [address]    Special Requests   Final    BOTTLES DRAWN AEROBIC AND ANAEROBIC Blood Culture adequate volume Performed at [facility] [address], [place], [place] [address]    Culture   Final    NO GROWTH 5 DAYS Performed at [facility], [place] [address], [place], [place] [address]    Report Status 04/22/2022 FINAL  Final     Labs: BNP (last 3 results) Recent Labs    04/14/22 0629 04/19/22 1300  BNP  558.8* 0000000*   Basic Metabolic Panel: Recent Labs  Lab 04/18/22 0524 04/19/22 0619 04/20/22 0516 04/21/22 0435 04/22/22 0551  NA 132* 133* 135 134* 136  K 3.7 3.4* 3.5 3.3* 3.9  CL 95* 95* 93* 94* 94*  CO2 27 27 30 29 29  $ GLUCOSE 226* 235* 217* 209* 213*  BUN 22* 16 16 17 18  $ CREATININE 1.09 0.91 0.89 0.79 0.78  CALCIUM 7.7* 7.6* 7.7* 7.3* 7.7*   Liver Function Tests: Recent Labs  Lab 04/16/22 0626 04/17/22 1034 04/18/22 0524 04/19/22 0619  AST 69* 53* 51* 42*  ALT 56* 48* 37 31  ALKPHOS 94 101 96 113  BILITOT 2.4* 1.8* 1.7* 1.0  PROT 5.4* 6.0* 5.8* 5.8*  ALBUMIN 2.4* 2.6* 2.4* 2.2*   No results for input(s): "LIPASE", "AMYLASE" in the last 168 hours. No results for input(s): "AMMONIA" in the last 168 hours. CBC: Recent Labs  Lab 04/16/22 0626 04/17/22 1034 04/19/22 0619  WBC 8.0 8.9 9.2  HGB 10.0* 10.8* 10.5*  HCT 31.1* 32.4* 32.2*  MCV 86.9 85.0 87.3  PLT 99* 120* 181   Cardiac Enzymes: No results for input(s): "CKTOTAL", "CKMB", "CKMBINDEX", "TROPONINI" in the last 168 hours. BNP: Invalid input(s): "POCBNP" CBG: Recent Labs  Lab 04/21/22 0727 04/21/22 1118 04/21/22 1644 04/21/22 2106 04/22/22 0725  GLUCAP 225* 257* 177* 246* 230*   D-Dimer No results for input(s): "DDIMER" in the last 72 hours. Hgb A1c No results for input(s): "HGBA1C" in the last 72 hours. Lipid Profile No results for input(s): "CHOL", "HDL", "LDLCALC", "TRIG", "CHOLHDL", "LDLDIRECT" in the last 72 hours. Thyroid function studies No results for input(s): "TSH", "T4TOTAL", "T3FREE", "THYROIDAB" in the last 72 hours.  Invalid input(s): "FREET3" Anemia work up No results for input(s): "VITAMINB12", "FOLATE", "FERRITIN", "TIBC", "IRON", "RETICCTPCT" in the last 72 hours. Urinalysis    Component Value Date/Time   COLORURINE AMBER (A) 04/14/2022 0434   APPEARANCEUR CLOUDY (A) 04/14/2022 0434   LABSPEC 1.018 04/14/2022 0434   PHURINE 5.0 04/14/2022 0434   GLUCOSEU >=500  (A) 04/14/2022 0434   HGBUR MODERATE (A) 04/14/2022 0434   BILIRUBINUR SMALL (A) 04/14/2022 0434   BILIRUBINUR neg 03/21/2013 1003   KETONESUR NEGATIVE 04/14/2022 0434   PROTEINUR 100 (A) 04/14/2022 0434   UROBILINOGEN 0.2 08/17/2015 1548   NITRITE NEGATIVE 04/14/2022 0434   LEUKOCYTESUR MODERATE (A) 04/14/2022 0434   Sepsis Labs Recent Labs  Lab 04/16/22 0626 04/17/22 1034 04/19/22 0619  WBC 8.0 8.9 9.2   Microbiology Recent Results (from the past 240 hour(s))  Resp panel by RT-PCR (RSV, Flu A&B, Covid) Anterior Nasal Swab     Status: None   Collection Time: 04/13/22 10:26 PM   Specimen: Anterior Nasal Swab  Result Value Ref Range Status   SARS Coronavirus 2 by RT PCR NEGATIVE NEGATIVE Final    Comment: (NOTE) SARS-CoV-2 target nucleic acids are NOT DETECTED.  The SARS-CoV-2 RNA is generally detectable in upper respiratory specimens during the acute phase of infection. The lowest concentration of SARS-CoV-2 viral copies this assay can detect is 138 copies/mL. A negative result does not preclude SARS-Cov-2 infection and should not be used as the sole basis for treatment or other patient management decisions. A negative result may occur with  improper specimen collection/handling, submission of specimen other than nasopharyngeal swab, presence of viral mutation(s) within the areas targeted by this assay, and inadequate number of viral copies(<138 copies/mL). A negative result must be combined with clinical observations, patient history, and epidemiological information. The expected result is Negative.  Fact Sheet for Patients:  EntrepreneurPulse.com.au  Fact Sheet for Healthcare Providers:  IncredibleEmployment.be  This test is no t yet approved or cleared by the [place] FDA and  has been authorized for detection and/or diagnosis of SARS-CoV-2 by FDA under an Emergency Use Authorization (EUA). This EUA will remain  in effect  (meaning this test can be used) for the duration of the COVID-19 declaration under Section 564(b)(1) of the Act, 21 U.S.C.section 360bbb-3(b)(1), unless the authorization is terminated  or revoked sooner.       Influenza A by PCR NEGATIVE NEGATIVE Final   Influenza B by PCR NEGATIVE NEGATIVE Final    Comment: (NOTE) The Xpert Xpress SARS-CoV-2/FLU/RSV plus assay is intended as an aid in the diagnosis of influenza from Nasopharyngeal swab specimens and should not be used as a sole basis for treatment. Nasal washings and aspirates are unacceptable for Xpert Xpress SARS-CoV-2/FLU/RSV testing.  Fact Sheet for Patients: EntrepreneurPulse.com.au  Fact Sheet for Healthcare Providers: IncredibleEmployment.be  This test is not yet approved  or cleared by the [place] and has been authorized for detection and/or diagnosis of SARS-CoV-2 by FDA under an Emergency Use Authorization (EUA). This EUA will remain in effect (meaning this test can be used) for the duration of the COVID-19 declaration under Section 564(b)(1) of the Act, 21 U.S.C. section 360bbb-3(b)(1), unless the authorization is terminated or revoked.     Resp Syncytial Virus by PCR NEGATIVE NEGATIVE Final    Comment: (NOTE) Fact Sheet for Patients: EntrepreneurPulse.com.au  Fact Sheet for Healthcare Providers: IncredibleEmployment.be  This test is not yet approved or cleared by the [place] FDA and has been authorized for detection and/or diagnosis of SARS-CoV-2 by FDA under an Emergency Use Authorization (EUA). This EUA will remain in effect (meaning this test can be used) for the duration of the COVID-19 declaration under Section 564(b)(1) of the Act, 21 U.S.C. section 360bbb-3(b)(1), unless the authorization is terminated or revoked.  Performed at [facility], [place] [address] [place], [place] [address]   Urine  Culture (for pregnant, neutropenic or urologic patients or patients with an indwelling urinary catheter)     Status: Abnormal   Collection Time: 04/14/22  4:34 AM   Specimen: Urine, Clean Catch  Result Value Ref Range Status   Specimen Description   Final    URINE, CLEAN CATCH Performed at [facility], [place] [address], [place], [place] [address]    Special Requests   Final    NONE Performed at [facility], [place] [address], [place], [place] [address]    Culture >=100,000 COLONIES/mL STAPHYLOCOCCUS AUREUS (A)  Final   Report Status 04/16/2022 FINAL  Final   Organism ID, Bacteria STAPHYLOCOCCUS AUREUS (A)  Final      Susceptibility   Staphylococcus aureus - MIC*    CIPROFLOXACIN <=0.5 SENSITIVE Sensitive     GENTAMICIN <=0.5 SENSITIVE Sensitive     NITROFURANTOIN 32 SENSITIVE Sensitive     OXACILLIN 0.5 SENSITIVE Sensitive     TETRACYCLINE <=1 SENSITIVE Sensitive     VANCOMYCIN 1 SENSITIVE Sensitive     TRIMETH/SULFA <=10 SENSITIVE Sensitive     CLINDAMYCIN RESISTANT Resistant     RIFAMPIN <=0.5 SENSITIVE Sensitive     Inducible Clindamycin POSITIVE Resistant     * >=100,000 COLONIES/mL STAPHYLOCOCCUS AUREUS  C Difficile Quick Screen w PCR reflex     Status: None   Collection Time: 04/14/22  6:40 AM   Specimen: STOOL  Result Value Ref Range Status   C Diff antigen NEGATIVE NEGATIVE Final   C Diff toxin NEGATIVE NEGATIVE Final   C Diff interpretation No C. difficile detected.  Final    Comment: Performed at [facility], [place] [address] [place], [place] [address]  Gastrointestinal Panel by PCR , Stool     Status: None   Collection Time: 04/14/22  6:40 AM   Specimen: STOOL  Result Value Ref Range Status   Campylobacter species NOT DETECTED NOT DETECTED Final   Plesimonas shigelloides NOT DETECTED NOT DETECTED Final   Salmonella species NOT DETECTED NOT DETECTED Final   Yersinia enterocolitica NOT DETECTED NOT DETECTED  Final   Vibrio species NOT DETECTED NOT DETECTED Final   Vibrio cholerae NOT DETECTED NOT DETECTED Final   Enteroaggregative E coli (EAEC) NOT DETECTED NOT DETECTED Final   Enteropathogenic E coli (EPEC) NOT DETECTED NOT DETECTED Final   Enterotoxigenic E coli (ETEC) NOT DETECTED NOT DETECTED Final   Shiga like toxin producing E coli (STEC) NOT  DETECTED NOT DETECTED Final   Shigella/Enteroinvasive E coli (EIEC) NOT DETECTED NOT DETECTED Final   Cryptosporidium NOT DETECTED NOT DETECTED Final   Cyclospora cayetanensis NOT DETECTED NOT DETECTED Final   Entamoeba histolytica NOT DETECTED NOT DETECTED Final   Giardia lamblia NOT DETECTED NOT DETECTED Final   Adenovirus F40/41 NOT DETECTED NOT DETECTED Final   Astrovirus NOT DETECTED NOT DETECTED Final   Norovirus GI/GII NOT DETECTED NOT DETECTED Final   Rotavirus A NOT DETECTED NOT DETECTED Final   Sapovirus (I, II, IV, and V) NOT DETECTED NOT DETECTED Final    Comment: Performed at [facility], [place] [place], [place] [address]  Culture, blood (Routine X 2) w Reflex to ID Panel     Status: Abnormal   Collection Time: 04/15/22  4:21 PM   Specimen: BLOOD LEFT HAND  Result Value Ref Range Status   Specimen Description   Final    BLOOD LEFT HAND BOTTLES DRAWN AEROBIC AND ANAEROBIC Performed at [facility], [place] [address], [place], [place] [address]    Special Requests   Final    Blood Culture adequate volume Performed at [place] [address] [place], [place] [address]    Culture  Setup Time   Final    IN BOTH AEROBIC AND ANAEROBIC BOTTLES GRAM POSITIVE COCCI IN CLUSTERS CRITICAL RESULT CALLED TO, READ BACK BY AND VERIFIED WITH:  C/ PHARMD E. WILLIAMSON 04/16/22 1307 A. LAFRANCE Performed at [facility], [place] [address], [place], [place] [address]    Culture STAPHYLOCOCCUS AUREUS (A)  Final   Report Status 04/18/2022 FINAL  Final   Organism ID, Bacteria  STAPHYLOCOCCUS AUREUS  Final      Susceptibility   Staphylococcus aureus - MIC*    CIPROFLOXACIN <=0.5 SENSITIVE Sensitive     ERYTHROMYCIN RESISTANT Resistant     GENTAMICIN <=0.5 SENSITIVE Sensitive     OXACILLIN 0.5 SENSITIVE Sensitive     TETRACYCLINE <=1 SENSITIVE Sensitive     VANCOMYCIN 1 SENSITIVE Sensitive     TRIMETH/SULFA <=10 SENSITIVE Sensitive     CLINDAMYCIN RESISTANT Resistant     RIFAMPIN <=0.5 SENSITIVE Sensitive     Inducible Clindamycin POSITIVE Resistant     * STAPHYLOCOCCUS AUREUS  Blood Culture ID Panel (Reflexed)     Status: Abnormal   Collection Time: 04/15/22  4:21 PM  Result Value Ref Range Status   Enterococcus faecalis NOT DETECTED NOT DETECTED Final   Enterococcus Faecium NOT DETECTED NOT DETECTED Final   Listeria monocytogenes NOT DETECTED NOT DETECTED Final   Staphylococcus species DETECTED (A) NOT DETECTED Final    Comment: CRITICAL RESULT CALLED TO, READ BACK BY AND VERIFIED WITH:  C/ PHARMD E. WILLIAMSON 04/16/22 1307 A. LAFRANCE    Staphylococcus aureus (BCID) DETECTED (A) NOT DETECTED Final    Comment: CRITICAL RESULT CALLED TO, READ BACK BY AND VERIFIED WITH:  C/ PHARMD E. WILLIAMSON 04/16/22 1307 A. LAFRANCE    Staphylococcus epidermidis NOT DETECTED NOT DETECTED Final   Staphylococcus lugdunensis NOT DETECTED NOT DETECTED Final   Streptococcus species NOT DETECTED NOT DETECTED Final   Streptococcus agalactiae NOT DETECTED NOT DETECTED Final   Streptococcus pneumoniae NOT DETECTED NOT DETECTED Final   Streptococcus pyogenes NOT DETECTED NOT DETECTED Final   A.calcoaceticus-baumannii NOT DETECTED NOT DETECTED Final   Bacteroides fragilis NOT DETECTED NOT DETECTED Final   Enterobacterales NOT DETECTED NOT DETECTED Final   Enterobacter cloacae complex NOT DETECTED NOT DETECTED Final  Escherichia coli NOT DETECTED NOT DETECTED Final   Klebsiella aerogenes NOT DETECTED NOT DETECTED Final   Klebsiella oxytoca NOT DETECTED NOT DETECTED Final    Klebsiella pneumoniae NOT DETECTED NOT DETECTED Final   Proteus species NOT DETECTED NOT DETECTED Final   Salmonella species NOT DETECTED NOT DETECTED Final   Serratia marcescens NOT DETECTED NOT DETECTED Final   Haemophilus influenzae NOT DETECTED NOT DETECTED Final   Neisseria meningitidis NOT DETECTED NOT DETECTED Final   Pseudomonas aeruginosa NOT DETECTED NOT DETECTED Final   Stenotrophomonas maltophilia NOT DETECTED NOT DETECTED Final   Candida albicans NOT DETECTED NOT DETECTED Final   Candida auris NOT DETECTED NOT DETECTED Final   Candida glabrata NOT DETECTED NOT DETECTED Final   Candida krusei NOT DETECTED NOT DETECTED Final   Candida parapsilosis NOT DETECTED NOT DETECTED Final   Candida tropicalis NOT DETECTED NOT DETECTED Final   Cryptococcus neoformans/gattii NOT DETECTED NOT DETECTED Final   Meth resistant mecA/C and MREJ NOT DETECTED NOT DETECTED Final    Comment: Performed at [facility], [place] [address] [place], [place] [address]  Culture, blood (Routine X 2) w Reflex to ID Panel     Status: Abnormal   Collection Time: 04/15/22  4:29 PM   Specimen: BLOOD RIGHT HAND  Result Value Ref Range Status   Specimen Description   Final    BLOOD RIGHT HAND BOTTLES DRAWN AEROBIC ONLY Performed at [place] [address], [place], [place] [address]    Special Requests   Final    Blood Culture results may not be optimal due to an inadequate volume of blood received in culture bottles Performed at [place] [address] [place], [place] [address]    Culture  Setup Time   Final    AEROBIC BOTTLE ONLY GRAM POSITIVE COCCI IN CLUSTERS CRITICAL VALUE NOTED.  VALUE IS CONSISTENT WITH PREVIOUSLY REPORTED AND CALLED VALUE.    Culture (A)  Final    STAPHYLOCOCCUS AUREUS SUSCEPTIBILITIES PERFORMED ON PREVIOUS CULTURE WITHIN THE LAST 5 DAYS. Performed at [facility], [place] [address], [place], [place] [address]    Report  Status 04/18/2022 FINAL  Final  Culture, blood (Routine X 2) w Reflex to ID Panel     Status: None   Collection Time: 04/17/22  5:47 AM   Specimen: BLOOD  Result Value Ref Range Status   Specimen Description   Final    BLOOD BLOOD LEFT ARM Performed at [place] [address] [place], [place] [address]    Special Requests   Final    BOTTLES DRAWN AEROBIC AND ANAEROBIC Blood Culture adequate volume Performed at [place] [address] [place], [place] [address]    Culture   Final    NO GROWTH 5 DAYS Performed at [facility], [place] [address] [place], [place] [address]    Report Status 04/22/2022 FINAL  Final  Culture, blood (Routine X 2) w Reflex to ID Panel     Status: None   Collection Time: 04/17/22  5:47 AM   Specimen: BLOOD  Result Value Ref Range Status   Specimen Description   Final    BLOOD BLOOD LEFT HAND Performed at [place] [address], [place],  [address]    Special Requests   Final    BOTTLES DRAWN AEROBIC AND ANAEROBIC Blood Culture adequate volume Performed at [facility]  [place], [place] [address]    Culture   Final    NO GROWTH 5 DAYS Performed at [facility], [place] [address], [place], [place] [address]    Report Status 04/22/2022 FINAL  Final    Please note: You were cared for by a hospitalist during your hospital stay. Once you are discharged, your primary care physician will handle any further medical issues. Please note that NO REFILLS for any discharge medications will be authorized once you are discharged, as it is imperative that you return to your primary care physician (or establish a relationship with a primary care physician if you do not have one) for your post hospital discharge needs so that they can reassess your need for medications and monitor your lab values.    Time coordinating discharge: 40  minutes  SIGNED:   Shelly Coss, MD  [facility] 04/22/2022, 10:23 AM Pager [phone]  If 7PM-7AM, please contact night-coverage [email or web address] Password [email or web address]

## 2022-04-24 ENCOUNTER — Telehealth: Payer: Self-pay

## 2022-04-24 ENCOUNTER — Encounter (HOSPITAL_COMMUNITY): Payer: Self-pay | Admitting: Internal Medicine

## 2022-04-24 NOTE — Transitions of Care (Post Inpatient/ED Visit) (Signed)
   04/24/2022  Name: DENON SCHWABE MRN: [id number] DOB: 1965/08/04  Today's TOC FU Call Status: Today's TOC FU Call Status:: Unsuccessul Call (1st Attempt) Unsuccessful Call (1st Attempt) Date: 04/24/22  Attempted to reach the patient regarding the most recent Inpatient/ED visit.  Follow Up Plan: No further outreach attempts will be made at this time. We have been unable to contact the patient.  Signature Juanda Crumble, [place] Direct Dial [phone]

## 2022-04-25 ENCOUNTER — Other Ambulatory Visit: Payer: Self-pay

## 2022-04-25 NOTE — Transitions of Care (Post Inpatient/ED Visit) (Signed)
   04/25/2022  Name: Carl [name] MRN: [id number] DOB: Mar 06, 1966  Today's TOC FU Call Status: Today's TOC FU Call Status:: Unsuccessful Call (3rd Attempt) Unsuccessful Call (1st Attempt) Date: 04/24/22 Unsuccessful Call (3rd Attempt) Date: 04/25/22  Attempted to reach the patient regarding the most recent Inpatient/ED visit.  Follow Up Plan: No further outreach attempts will be made at this time. We have been unable to contact the patient.  Signature Juanda Crumble, [place] Direct Dial [phone]

## 2022-05-01 ENCOUNTER — Other Ambulatory Visit: Payer: Self-pay

## 2022-05-02 ENCOUNTER — Ambulatory Visit (INDEPENDENT_AMBULATORY_CARE_PROVIDER_SITE_OTHER): Payer: Self-pay

## 2022-05-02 ENCOUNTER — Other Ambulatory Visit (INDEPENDENT_AMBULATORY_CARE_PROVIDER_SITE_OTHER): Payer: Self-pay

## 2022-05-02 VITALS — BP 156/88 | HR 100 | Temp 97.8°F | Resp 20 | Ht 70.0 in | Wt 258.6 lb

## 2022-05-02 DIAGNOSIS — R7881 Bacteremia: Secondary | ICD-10-CM

## 2022-05-02 DIAGNOSIS — B9561 Methicillin susceptible Staphylococcus aureus infection as the cause of diseases classified elsewhere: Secondary | ICD-10-CM

## 2022-05-02 DIAGNOSIS — Z452 Encounter for adjustment and management of vascular access device: Secondary | ICD-10-CM

## 2022-05-02 NOTE — Progress Notes (Signed)
Diagnosis: Staphylococcus aureus bacteremia  Provider:  Marshell Garfinkel, MD  Procedure: PICC Dressing Change  Labs Drawn, Line Flushed, and Dressing Changed. Blood sample sent to the lab.  Discharge: Condition: Good, Destination: Home . AVS provided to patient.   Performed by:  Arnoldo Morale, RN

## 2022-05-03 ENCOUNTER — Encounter: Payer: Self-pay | Admitting: Student

## 2022-05-03 ENCOUNTER — Ambulatory Visit (INDEPENDENT_AMBULATORY_CARE_PROVIDER_SITE_OTHER): Payer: Self-pay | Admitting: Student

## 2022-05-03 VITALS — BP 142/80 | HR 105 | Ht 70.0 in | Wt 258.2 lb

## 2022-05-03 DIAGNOSIS — E1159 Type 2 diabetes mellitus with other circulatory complications: Secondary | ICD-10-CM

## 2022-05-03 DIAGNOSIS — I10 Essential (primary) hypertension: Secondary | ICD-10-CM

## 2022-05-03 DIAGNOSIS — R7881 Bacteremia: Secondary | ICD-10-CM

## 2022-05-03 DIAGNOSIS — B9561 Methicillin susceptible Staphylococcus aureus infection as the cause of diseases classified elsewhere: Secondary | ICD-10-CM

## 2022-05-03 DIAGNOSIS — N390 Urinary tract infection, site not specified: Secondary | ICD-10-CM

## 2022-05-03 LAB — BASIC METABOLIC PANEL
BUN: 19 mg/dL (ref 6–23)
CO2: 26 mEq/L (ref 19–32)
Calcium: 9.1 mg/dL (ref 8.4–10.5)
Chloride: 101 mEq/L (ref 96–112)
Creatinine, Ser: 0.75 mg/dL (ref 0.40–1.50)
GFR: 99.76 mL/min (ref >60.00)
Glucose, Bld: 135 mg/dL — ABNORMAL HIGH (ref 70–99)
Potassium: 4.5 mEq/L (ref 3.5–5.1)
Sodium: 138 mEq/L (ref 135–145)

## 2022-05-03 LAB — CBC WITH DIFFERENTIAL/PLATELET
Basophils Absolute: 0.1 10*3/uL (ref 0.0–0.1)
Basophils Relative: 1 % (ref 0.0–3.0)
Eosinophils Absolute: 0.4 10*3/uL (ref 0.0–0.7)
Eosinophils Relative: 6.1 % — ABNORMAL HIGH (ref 0.0–5.0)
HCT: 32.1 % — ABNORMAL LOW (ref 39.0–52.0)
Hemoglobin: 10.8 g/dL — ABNORMAL LOW (ref 13.0–17.0)
Lymphocytes Relative: 25.4 % (ref 12.0–46.0)
Lymphs Abs: 1.7 10*3/uL (ref 0.7–4.0)
MCHC: 33.6 g/dL (ref 30.0–36.0)
MCV: 83.4 fl (ref 78.0–100.0)
Monocytes Absolute: 0.5 10*3/uL (ref 0.1–1.0)
Monocytes Relative: 7.5 % (ref 3.0–12.0)
Neutro Abs: 3.9 10*3/uL (ref 1.4–7.7)
Neutrophils Relative %: 60 % (ref 43.0–77.0)
Platelets: 276 10*3/uL (ref 150.0–400.0)
RBC: 3.85 Mil/uL — ABNORMAL LOW (ref 4.22–5.81)
RDW: 15.5 % (ref 11.5–15.5)
WBC: 6.5 10*3/uL (ref 4.0–10.5)

## 2022-05-03 LAB — POCT URINALYSIS DIP (MANUAL ENTRY)
Bilirubin, UA: NEGATIVE
Glucose, UA: NEGATIVE mg/dL
Ketones, POC UA: NEGATIVE mg/dL
Nitrite, UA: NEGATIVE
Protein Ur, POC: 30 mg/dL — AB
Spec Grav, UA: 1.01 (ref 1.010–1.025)
Urobilinogen, UA: 0.2 E.U./dL
pH, UA: 5.5 (ref 5.0–8.0)

## 2022-05-03 LAB — POCT UA - MICROSCOPIC ONLY

## 2022-05-03 LAB — SEDIMENTATION RATE: Sed Rate: 58 mm/hr — ABNORMAL HIGH (ref 0–20)

## 2022-05-03 LAB — C-REACTIVE PROTEIN: CRP: 1 mg/dL (ref 0.5–20.0)

## 2022-05-03 MED ORDER — AMLODIPINE BESYLATE 10 MG PO TABS: 10.0000 mg | ORAL_TABLET | Freq: Every day | ORAL | 0 refills | Status: DC

## 2022-05-03 NOTE — Assessment & Plan Note (Addendum)
Uncontrolled but has been out of all meds since leaving the hospital. - Refilling amlodipine 13m  today

## 2022-05-03 NOTE — Patient Instructions (Addendum)
Please call the infectious disease office at [phone] and make an appointment in the next 2-4 weeks. Let them know that you were recently hospitalized and that Dr. Baxter Flattery wanted you to follow-up in the office.  I'm refilling your amlodipine.  Congrats on your daughter's upcoming wedding! I know you're a proud dad. Marnee Guarneri, MD

## 2022-05-03 NOTE — Assessment & Plan Note (Signed)
Doing well.  Currently staying with his girlfriend who is helping with administration of Ancef.  Denies any further symptoms though he is a bit tired from the whole ordeal.  Eager to get back to work due to lack of income. -Will repeat UA to monitor for hematuria -Serum labs collected yesterday, I will follow these up -Work note provided -Continue Ancef for full 6-week course -Advised to call ID office to make an appointment in the next 2 to 4 weeks

## 2022-05-03 NOTE — Progress Notes (Signed)
    SUBJECTIVE:   CHIEF COMPLAINT / HPI:   Hospital Follow-up Recent hospitalization for MSSA bacteremia thought 2/2 UTI, found to have septic arthritis of the lumbar spine without fluid collection or abscess. TEE without vegetation, normal function. D/c home with PICC for prolonged Ancef, to complete course on 05/29/22.  Per chart review, ID wants to see in office in the next 2-4 weeks. No visit scheduled at this time.  Feeling quite well, getting back to himself.  Wondering about getting back to work.  Feels that he would be able to assume his full work duties in his current state of health.   HTN Out of amlodipine for several weeks.   Insurance Out Ran out in the middle of last year due to increased premiums. Therefore was unable to complete colon CA screening  Social Very excited to share that his daughter will be getting married in [place] next month.    OBJECTIVE:   BP (!) 142/80   Pulse (!) 105   Ht 5' 10"$  (1.778 m)   Wt 258 lb 3.2 oz (117.1 kg)   SpO2 98%   BMI 37.05 kg/m   Physical Exam Constitutional:      General: He is not in acute distress. Cardiovascular:     Rate and Rhythm: Normal rate and regular rhythm.     Heart sounds: No murmur heard. Pulmonary:     Effort: Pulmonary effort is normal.     Breath sounds: No wheezing or rales.  Abdominal:     General: Abdomen is flat. There is no distension.     Palpations: Abdomen is soft.     Tenderness: There is no abdominal tenderness.  Musculoskeletal:     Comments: + midline lumbar tenderness without stepoff or evidence of underlying fluctuance  Skin:    Capillary Refill: Capillary refill takes less than 2 seconds.  Neurological:     General: No focal deficit present.  Psychiatric:        Mood and Affect: Mood normal.      ASSESSMENT/PLAN:   Staphylococcus aureus bacteremia Doing well.  Currently staying with his girlfriend who is helping with administration of Ancef.  Denies any further symptoms  though he is a bit tired from the whole ordeal.  Eager to get back to work due to lack of income. -Will repeat UA to monitor for hematuria -Serum labs collected yesterday, I will follow these up -Work note provided -Continue Ancef for full 6-week course -Advised to call ID office to make an appointment in the next 2 to 4 weeks  Hypertension Uncontrolled but has been out of all meds since leaving the hospital. - Refilling amlodipine 26m  today     J BPearla Dubonnet MD [facility]

## 2022-05-04 LAB — MICROALBUMIN / CREATININE URINE RATIO
Creatinine, Urine: 60 mg/dL
Microalb/Creat Ratio: 189 mg/g creat — ABNORMAL HIGH (ref 0–29)
Microalbumin, Urine: 113.2 ug/mL

## 2022-05-09 ENCOUNTER — Ambulatory Visit (INDEPENDENT_AMBULATORY_CARE_PROVIDER_SITE_OTHER): Payer: Self-pay

## 2022-05-09 ENCOUNTER — Other Ambulatory Visit (INDEPENDENT_AMBULATORY_CARE_PROVIDER_SITE_OTHER): Payer: Self-pay

## 2022-05-09 VITALS — BP 176/78 | HR 70 | Temp 98.1°F | Resp 20 | Wt 259.4 lb

## 2022-05-09 DIAGNOSIS — R7881 Bacteremia: Secondary | ICD-10-CM

## 2022-05-09 DIAGNOSIS — B9561 Methicillin susceptible Staphylococcus aureus infection as the cause of diseases classified elsewhere: Secondary | ICD-10-CM

## 2022-05-09 DIAGNOSIS — Z452 Encounter for adjustment and management of vascular access device: Secondary | ICD-10-CM

## 2022-05-09 NOTE — Progress Notes (Signed)
Diagnosis: Staphylococcus aureus Bacteriemia  Provider:  Marshell Garfinkel, MD  Procedure: PICC Dressing Change  Labs Drawn, Line Flushed, and Dressing Changed. Blood sample sent to lab.  Discharge: Condition: Good, Destination: Home . AVS Declined.  Performed by:  Arnoldo Morale, RN

## 2022-05-10 LAB — CBC WITH DIFFERENTIAL/PLATELET
Basophils Absolute: 0.1 10*3/uL (ref 0.0–0.1)
Basophils Relative: 1.2 % (ref 0.0–3.0)
Eosinophils Absolute: 0.6 10*3/uL (ref 0.0–0.7)
Eosinophils Relative: 8.5 % — ABNORMAL HIGH (ref 0.0–5.0)
HCT: 31.6 % — ABNORMAL LOW (ref 39.0–52.0)
Hemoglobin: 10.6 g/dL — ABNORMAL LOW (ref 13.0–17.0)
Lymphocytes Relative: 21.4 % (ref 12.0–46.0)
Lymphs Abs: 1.6 10*3/uL (ref 0.7–4.0)
MCHC: 33.4 g/dL (ref 30.0–36.0)
MCV: 83.5 fl (ref 78.0–100.0)
Monocytes Absolute: 0.5 10*3/uL (ref 0.1–1.0)
Monocytes Relative: 7.4 % (ref 3.0–12.0)
Neutro Abs: 4.5 10*3/uL (ref 1.4–7.7)
Neutrophils Relative %: 61.5 % (ref 43.0–77.0)
Platelets: 291 10*3/uL (ref 150.0–400.0)
RBC: 3.79 Mil/uL — ABNORMAL LOW (ref 4.22–5.81)
RDW: 15.6 % — ABNORMAL HIGH (ref 11.5–15.5)
WBC: 7.3 10*3/uL (ref 4.0–10.5)

## 2022-05-10 LAB — BASIC METABOLIC PANEL
BUN: 18 mg/dL (ref 6–23)
CO2: 26 mEq/L (ref 19–32)
Calcium: 9.2 mg/dL (ref 8.4–10.5)
Chloride: 102 mEq/L (ref 96–112)
Creatinine, Ser: 0.66 mg/dL (ref 0.40–1.50)
GFR: 103.67 mL/min (ref >60.00)
Glucose, Bld: 115 mg/dL — ABNORMAL HIGH (ref 70–99)
Potassium: 4.3 mEq/L (ref 3.5–5.1)
Sodium: 136 mEq/L (ref 135–145)

## 2022-05-10 LAB — C-REACTIVE PROTEIN: CRP: <1 mg/dL (ref 0.5–20.0)

## 2022-05-10 LAB — SEDIMENTATION RATE: Sed Rate: 56 mm/hr — ABNORMAL HIGH (ref 0–20)

## 2022-05-15 ENCOUNTER — Other Ambulatory Visit: Payer: Self-pay

## 2022-05-15 ENCOUNTER — Telehealth: Payer: Self-pay

## 2022-05-15 ENCOUNTER — Encounter: Payer: Self-pay | Admitting: Internal Medicine

## 2022-05-15 ENCOUNTER — Ambulatory Visit (INDEPENDENT_AMBULATORY_CARE_PROVIDER_SITE_OTHER): Payer: Self-pay | Admitting: Internal Medicine

## 2022-05-15 VITALS — BP 134/80 | HR 79 | Temp 98.1°F | Resp 16 | Ht 70.0 in | Wt 264.4 lb

## 2022-05-15 DIAGNOSIS — R7881 Bacteremia: Secondary | ICD-10-CM

## 2022-05-15 DIAGNOSIS — B9561 Methicillin susceptible Staphylococcus aureus infection as the cause of diseases classified elsewhere: Secondary | ICD-10-CM

## 2022-05-15 DIAGNOSIS — M4646 Discitis, unspecified, lumbar region: Secondary | ICD-10-CM

## 2022-05-15 MED ORDER — CEFADROXIL 1 G PO TABS: 1.0000 g | ORAL_TABLET | Freq: Two times a day (BID) | ORAL | 0 refills | Status: DC

## 2022-05-15 NOTE — Progress Notes (Signed)
RFV: follow up for hospitalization  Patient ID: Carl [name], male   DOB: 1966/03/07, 57 y.o.   MRN: [id number]  HPI [place] is a 57yo M with recent hospitalization for sepsis with MSSA bacteremia found to also have early L4-L5 septic arthritis. Currently on 4 wk of 6 wk of cefazolin for which he is tolerating.  He recently participated in his daughter's wedding.  Outpatient Encounter Medications as of 05/15/2022  Medication Sig   acetaminophen (TYLENOL) 325 MG tablet Take 2 tablets (650 mg total) by mouth every 6 (six) hours as needed for mild pain or fever.   amLODipine (NORVASC) 10 MG tablet Take 1 tablet (10 mg total) by mouth daily.   aspirin EC 81 MG tablet Take 1 tablet (81 mg total) by mouth daily. Swallow whole.   atorvastatin (LIPITOR) 20 MG tablet Take 1 tablet by mouth once daily   blood glucose meter kit and supplies KIT Dispense based on patient and insurance preference. Use up to four times daily as directed. (FOR ICD-9 250.00, 250.01).   ceFAZolin (ANCEF) IVPB Inject 2 g into the vein every 8 (eight) hours. Indication:  cefazolin 2g IV q8h First Dose: No Last Day of Therapy:  05/28/2022 Labs - Once weekly:  CBC/D and BMP, Labs - Every other week:  ESR and CRP Method of administration: IV Push Method of administration may be changed at the discretion of home infusion pharmacist based upon assessment of the patient and/or caregiver's ability to self-administer the medication ordered.   glipiZIDE (GLUCOTROL XL) 5 MG 24 hr tablet Take 1 tablet (5 mg total) by mouth daily.   metFORMIN (GLUCOPHAGE-XR) 500 MG 24 hr tablet TAKE 2 TABLETS BY MOUTH TWICE DAILY WITH A MEAL   Multiple Vitamin (MULTIVITAMIN WITH MINERALS) TABS tablet Take 1 tablet by mouth daily.   furosemide (LASIX) 40 MG tablet Take 1 tablet (40 mg total) by mouth daily for 5 days.   potassium chloride SA (KLOR-CON M) 20 MEQ tablet Take 1 tablet (20 mEq total) by mouth daily for 5 days.   No facility-administered  encounter medications on file as of 05/15/2022.     Patient Active Problem List   Diagnosis Date Noted   Staphylococcus aureus bacteremia 04/18/2022   Hypoxia 04/18/2022   Thrombocytopenia (HCC) 04/18/2022   Acute lower UTI 04/17/2022   AKI (acute kidney injury) (HCC) 04/14/2022   Hyponatremia 04/14/2022   UTI (urinary tract infection) 04/14/2022   Nausea and vomiting 04/14/2022   Diarrhea 04/14/2022   Elevated liver enzymes 04/14/2022   Osteomyelitis of second toe of left foot (HCC) 12/26/2020   Acute osteomyelitis of metatarsal bone of left foot (HCC) 12/26/2020   Healthcare maintenance 10/27/2020   Planned postoperative wound closure    Cellulitis and abscess of toe of left foot    Diabetic foot infection (HCC)    Abscess of left foot    Osteomyelitis (HCC) 09/21/2020   Type 2 diabetes mellitus (HCC) 01/19/2015   Hyperlipidemia 06/12/2011   Hypertension 06/12/2011     Health Maintenance Due  Topic Date Due   COVID-19 Vaccine (1) Never done   OPHTHALMOLOGY EXAM  Never done   DTaP/Tdap/Td (1 - Tdap) Never done   COLONOSCOPY (Pts 45-51yrs Insurance coverage will need to be confirmed)  Never done   Zoster Vaccines- Shingrix (1 of 2) Never done   INFLUENZA VACCINE  10/11/2021     Review of Systems Review of Systems  Constitutional: Negative for fever, chills, diaphoresis, activity change, appetite change,  fatigue and unexpected weight change.  HENT: Negative for congestion, sore throat, rhinorrhea, sneezing, trouble swallowing and sinus pressure.  Eyes: Negative for photophobia and visual disturbance.  Respiratory: Negative for cough, chest tightness, shortness of breath, wheezing and stridor.  Cardiovascular: Negative for chest pain, palpitations and leg swelling.  Gastrointestinal: Negative for nausea, vomiting, abdominal pain, diarrhea, constipation, blood in stool, abdominal distention and anal bleeding.  Genitourinary: Negative for dysuria, hematuria, flank pain and  difficulty urinating.  Musculoskeletal: Negative for myalgias, back pain, joint swelling, arthralgias and gait problem.  Skin: Negative for color change, pallor, rash and wound.  Neurological: Negative for dizziness, tremors, weakness and light-headedness.  Hematological: Negative for adenopathy. Does not bruise/bleed easily.  Psychiatric/Behavioral: Negative for behavioral problems, confusion, sleep disturbance, dysphoric mood, decreased concentration and agitation.   Physical Exam   BP 134/80   Pulse 79   Temp 98.1 F (36.7 C) (Oral)   Resp 16   Ht 5\' 10"  (1.778 m)   Wt 264 lb 6.4 oz (119.9 kg)   SpO2 100%   BMI 37.94 kg/m   Physical Exam  Constitutional: He is oriented to person, place, and time. He appears well-developed and well-nourished. No distress.  HENT:  Mouth/Throat: Oropharynx is clear and moist. No oropharyngeal exudate.  Cardiovascular: Normal rate, regular rhythm and normal heart sounds. Exam reveals no gallop and no friction rub.  No murmur heard.  Pulmonary/Chest: Effort normal and breath sounds normal. No respiratory distress. He has no wheezes.  Abdominal: Soft. Bowel sounds are normal. He exhibits no distension. There is no tenderness.  Lymphadenopathy:  He has no cervical adenopathy.  Neurological: He is alert and oriented to person, place, and time.  Skin: Skin is warm and dry. No rash noted. No erythema.  Psychiatric: He has a normal mood and affect. His behavior is normal.   Lab Results  Component Value Date   LABRPR NON REAC 06/18/2013    CBC Lab Results  Component Value Date   WBC 7.3 05/09/2022   RBC 3.79 (L) 05/09/2022   HGB 10.6 (L) 05/09/2022   HCT 31.6 (L) 05/09/2022   PLT 291.0 05/09/2022   MCV 83.5 05/09/2022   MCH 28.5 04/19/2022   MCHC 33.4 05/09/2022   RDW 15.6 (H) 05/09/2022   LYMPHSABS 1.6 05/09/2022   MONOABS 0.5 05/09/2022   EOSABS 0.6 05/09/2022    BMET Lab Results  Component Value Date   NA 136 05/09/2022   K 4.3  05/09/2022   CL 102 05/09/2022   CO2 26 05/09/2022   GLUCOSE 115 (H) 05/09/2022   BUN 18 05/09/2022   CREATININE 0.66 05/09/2022   CALCIUM 9.2 05/09/2022   GFRNONAA >60 04/22/2022   GFRAA >89 08/17/2015      Assessment and Plan MSSA bacteremia with lumbar discitis = Plan to continue with iv cefazolin through march 17th Will plan to continue on cefadroxil 1000mg  po bid to start on march 18th until we see him in clinic in roughly 3-4 wk

## 2022-05-15 NOTE — Telephone Encounter (Signed)
Per Dr.Snider - Pull PICC after last dose of IV abx on 05/28/2022. Patient is going to infusion center for PICC line dressing changes -  [name] can patient have PICC line pulled at appointment on 3/19?   Message sent to Carolynn Sayers, RN and [facility] staff regarding PICC orders.    Gardner, CMA

## 2022-05-16 ENCOUNTER — Ambulatory Visit (INDEPENDENT_AMBULATORY_CARE_PROVIDER_SITE_OTHER): Payer: Self-pay

## 2022-05-16 ENCOUNTER — Other Ambulatory Visit (INDEPENDENT_AMBULATORY_CARE_PROVIDER_SITE_OTHER): Payer: Self-pay

## 2022-05-16 VITALS — BP 131/61 | HR 80 | Temp 98.2°F | Resp 18 | Ht 70.0 in | Wt 266.0 lb

## 2022-05-16 DIAGNOSIS — Z452 Encounter for adjustment and management of vascular access device: Secondary | ICD-10-CM

## 2022-05-16 DIAGNOSIS — B9561 Methicillin susceptible Staphylococcus aureus infection as the cause of diseases classified elsewhere: Secondary | ICD-10-CM

## 2022-05-16 DIAGNOSIS — R7881 Bacteremia: Secondary | ICD-10-CM

## 2022-05-16 LAB — C-REACTIVE PROTEIN: CRP: <1 mg/dL (ref 0.5–20.0)

## 2022-05-16 LAB — CBC WITH DIFFERENTIAL/PLATELET
Basophils Absolute: 0.1 10*3/uL (ref 0.0–0.1)
Basophils Relative: 1.3 % (ref 0.0–3.0)
Eosinophils Absolute: 0.6 10*3/uL (ref 0.0–0.7)
Eosinophils Relative: 8.4 % — ABNORMAL HIGH (ref 0.0–5.0)
HCT: 29.7 % — ABNORMAL LOW (ref 39.0–52.0)
Hemoglobin: 10 g/dL — ABNORMAL LOW (ref 13.0–17.0)
Lymphocytes Relative: 28 % (ref 12.0–46.0)
Lymphs Abs: 1.9 10*3/uL (ref 0.7–4.0)
MCHC: 33.7 g/dL (ref 30.0–36.0)
MCV: 83.1 fl (ref 78.0–100.0)
Monocytes Absolute: 0.5 10*3/uL (ref 0.1–1.0)
Monocytes Relative: 6.8 % (ref 3.0–12.0)
Neutro Abs: 3.7 10*3/uL (ref 1.4–7.7)
Neutrophils Relative %: 55.5 % (ref 43.0–77.0)
Platelets: 250 10*3/uL (ref 150.0–400.0)
RBC: 3.57 Mil/uL — ABNORMAL LOW (ref 4.22–5.81)
RDW: 15.7 % — ABNORMAL HIGH (ref 11.5–15.5)
WBC: 6.7 10*3/uL (ref 4.0–10.5)

## 2022-05-16 LAB — BASIC METABOLIC PANEL
BUN: 22 mg/dL (ref 6–23)
CO2: 24 mEq/L (ref 19–32)
Calcium: 9.1 mg/dL (ref 8.4–10.5)
Chloride: 102 mEq/L (ref 96–112)
Creatinine, Ser: 0.73 mg/dL (ref 0.40–1.50)
GFR: 100.55 mL/min (ref >60.00)
Glucose, Bld: 128 mg/dL — ABNORMAL HIGH (ref 70–99)
Potassium: 4.4 mEq/L (ref 3.5–5.1)
Sodium: 136 mEq/L (ref 135–145)

## 2022-05-16 LAB — SEDIMENTATION RATE: Sed Rate: 37 mm/hr — ABNORMAL HIGH (ref 0–20)

## 2022-05-16 NOTE — Progress Notes (Signed)
Diagnosis: Staphylococcus aureus Bacteriemia  Provider:  Marshell Garfinkel, MD  Procedure: PICC Dressing Change  Labs Drawn, Line Flushed, and Dressing Changed  Discharge: Condition: Good, Destination: Home . AVS provided to patient.   Performed by:  Cleophus Molt, RN

## 2022-05-18 NOTE — Progress Notes (Signed)
Diagnosis: Staphylococcus aureus Bacteriemia  Provider:  Marshell Garfinkel, MD  Procedure: PICC Dressing Change  Labs Drawn, Line Flushed, and Dressing Changed  Discharge: Condition: Good, Destination: Home . AVS provided to patient.   Performed by:  Jonelle Sidle, RN

## 2022-05-23 ENCOUNTER — Ambulatory Visit (INDEPENDENT_AMBULATORY_CARE_PROVIDER_SITE_OTHER): Payer: Self-pay

## 2022-05-23 ENCOUNTER — Other Ambulatory Visit (INDEPENDENT_AMBULATORY_CARE_PROVIDER_SITE_OTHER): Payer: Self-pay

## 2022-05-23 DIAGNOSIS — B9561 Methicillin susceptible Staphylococcus aureus infection as the cause of diseases classified elsewhere: Secondary | ICD-10-CM

## 2022-05-23 DIAGNOSIS — Z452 Encounter for adjustment and management of vascular access device: Secondary | ICD-10-CM

## 2022-05-23 DIAGNOSIS — R7881 Bacteremia: Secondary | ICD-10-CM

## 2022-05-23 LAB — BASIC METABOLIC PANEL
BUN: 24 mg/dL — ABNORMAL HIGH (ref 6–23)
CO2: 24 mEq/L (ref 19–32)
Calcium: 9.8 mg/dL (ref 8.4–10.5)
Chloride: 101 mEq/L (ref 96–112)
Creatinine, Ser: 0.78 mg/dL (ref 0.40–1.50)
GFR: 98.55 mL/min (ref >60.00)
Glucose, Bld: 126 mg/dL — ABNORMAL HIGH (ref 70–99)
Potassium: 4.3 mEq/L (ref 3.5–5.1)
Sodium: 135 mEq/L (ref 135–145)

## 2022-05-23 LAB — CBC WITH DIFFERENTIAL/PLATELET
Basophils Absolute: 0.1 10*3/uL (ref 0.0–0.1)
Basophils Relative: 1.4 % (ref 0.0–3.0)
Eosinophils Absolute: 0.7 10*3/uL (ref 0.0–0.7)
Eosinophils Relative: 8.8 % — ABNORMAL HIGH (ref 0.0–5.0)
HCT: 33 % — ABNORMAL LOW (ref 39.0–52.0)
Hemoglobin: 11 g/dL — ABNORMAL LOW (ref 13.0–17.0)
Lymphocytes Relative: 25.7 % (ref 12.0–46.0)
Lymphs Abs: 2 10*3/uL (ref 0.7–4.0)
MCHC: 33.3 g/dL (ref 30.0–36.0)
MCV: 82.8 fl (ref 78.0–100.0)
Monocytes Absolute: 0.5 10*3/uL (ref 0.1–1.0)
Monocytes Relative: 6.4 % (ref 3.0–12.0)
Neutro Abs: 4.4 10*3/uL (ref 1.4–7.7)
Neutrophils Relative %: 57.7 % (ref 43.0–77.0)
Platelets: 229 10*3/uL (ref 150.0–400.0)
RBC: 3.98 Mil/uL — ABNORMAL LOW (ref 4.22–5.81)
RDW: 16.7 % — ABNORMAL HIGH (ref 11.5–15.5)
WBC: 7.6 10*3/uL (ref 4.0–10.5)

## 2022-05-23 LAB — C-REACTIVE PROTEIN: CRP: <1 mg/dL (ref 0.5–20.0)

## 2022-05-23 LAB — SEDIMENTATION RATE: Sed Rate: 29 mm/hr — ABNORMAL HIGH (ref 0–20)

## 2022-05-23 NOTE — Progress Notes (Cosign Needed Addendum)
Diagnosis: Staphylococcus aureus Bacteriemia   Provider:  Marshell Garfinkel, MD  Procedure: PICC Dressing Change  Labs Drawn, Line Flushed, and Dressing Changed. Blood sample sent to lab.  Discharge: Condition: Good, Destination: Home . AVS provided to patient.   Performed by:  Arnoldo Morale, RN

## 2022-05-30 ENCOUNTER — Ambulatory Visit: Payer: Self-pay

## 2022-06-01 ENCOUNTER — Ambulatory Visit (INDEPENDENT_AMBULATORY_CARE_PROVIDER_SITE_OTHER): Payer: Self-pay | Admitting: Internal Medicine

## 2022-06-01 ENCOUNTER — Other Ambulatory Visit: Payer: Self-pay

## 2022-06-01 ENCOUNTER — Encounter: Payer: Self-pay | Admitting: Internal Medicine

## 2022-06-01 VITALS — BP 146/83 | HR 85 | Temp 98.2°F | Ht 70.0 in | Wt 265.0 lb

## 2022-06-01 DIAGNOSIS — R7881 Bacteremia: Secondary | ICD-10-CM

## 2022-06-01 DIAGNOSIS — B9561 Methicillin susceptible Staphylococcus aureus infection as the cause of diseases classified elsewhere: Secondary | ICD-10-CM

## 2022-06-01 DIAGNOSIS — M4646 Discitis, unspecified, lumbar region: Secondary | ICD-10-CM

## 2022-06-01 MED ORDER — CEPHALEXIN 500 MG PO CAPS: 500.0000 mg | ORAL_CAPSULE | Freq: Four times a day (QID) | ORAL | 1 refills | Status: DC

## 2022-06-01 NOTE — Progress Notes (Signed)
Labs drawn via PICC line per Dr. Baxter Flattery. Line flushed with 10 mL normal saline and clamped. Patient tolerated procedure well.   PICC Removal    PICC length & location:  right basilic 39 cm Removed per verbal order from: Dr. Baxter Flattery  Blood thinners:  aspirin 81 mg Platelet count:  229 (05/23/22)  Site assessment: Dressing clean and dry. Extremity warm and dry. No drainage or swelling present at insertion site. Mild erythema approximately 0.5 cm around insertion site, discussed signs of infection with patient and encouraged him to monitor the site.   Pre-removal vital signs:  BP:  146/83 HR:  80 SpO2:  96%  Insertion site positioned below level of heart. No sutures present. Insertion site cleaned with CHG, catheter removed and petroleum dressing applied. Tip intact. Pressure held until hemostasis achieved.    Length of catheter removed:  39 cm   Provided patient with after care instructions and precautions print out (via [place]). Reviewed this information with patient.   Patient verbalized understanding and agreement, all questions answered. Patient tolerated procedure well and remained in clinic under the care of RN 30 minutes post removal.  Post-observation vital signs:  BP:  146/83 HR:  85 SpO2:  98%  Notified Carolynn Sayers, RN with [facility] and [facility] pharmacy team of removal.  Beryle Flock, RN

## 2022-06-01 NOTE — Progress Notes (Signed)
Patient ID: Carl [name], male   DOB: 03/13/1966, 57 y.o.   MRN: [id number]  HPI Carl [name] is a 57 yo M who has finished IV abtx for MSSA lumbar discitis with bacteremia. Who has completed his iv abtx on 3/18 and here to pull picc line. He reports back pain feeling better.   Outpatient Encounter Medications as of 06/01/2022  Medication Sig   acetaminophen (TYLENOL) 325 MG tablet Take 2 tablets (650 mg total) by mouth every 6 (six) hours as needed for mild pain or fever.   amLODipine (NORVASC) 10 MG tablet Take 1 tablet (10 mg total) by mouth daily.   aspirin EC 81 MG tablet Take 1 tablet (81 mg total) by mouth daily. Swallow whole.   atorvastatin (LIPITOR) 20 MG tablet Take 1 tablet by mouth once daily   blood glucose meter kit and supplies KIT Dispense based on patient and insurance preference. Use up to four times daily as directed. (FOR ICD-9 250.00, 250.01).   glipiZIDE (GLUCOTROL XL) 5 MG 24 hr tablet Take 1 tablet (5 mg total) by mouth daily.   metFORMIN (GLUCOPHAGE-XR) 500 MG 24 hr tablet TAKE 2 TABLETS BY MOUTH TWICE DAILY WITH A MEAL   Multiple Vitamin (MULTIVITAMIN WITH MINERALS) TABS tablet Take 1 tablet by mouth daily.   cefadroxil (DURICEF) 1 g tablet Take 1 tablet (1 g total) by mouth 2 (two) times daily. Start taking on march 18th (Patient not taking: Reported on 06/01/2022)   furosemide (LASIX) 40 MG tablet Take 1 tablet (40 mg total) by mouth daily for 5 days.   potassium chloride SA (KLOR-CON M) 20 MEQ tablet Take 1 tablet (20 mEq total) by mouth daily for 5 days.   No facility-administered encounter medications on file as of 06/01/2022.     Patient Active Problem List   Diagnosis Date Noted   Staphylococcus aureus bacteremia 04/18/2022   Hypoxia 04/18/2022   Thrombocytopenia ([place]) 04/18/2022   Acute lower UTI 04/17/2022   AKI (acute kidney injury) ([place]) 04/14/2022   Hyponatremia 04/14/2022   UTI (urinary tract infection) 04/14/2022   Nausea and vomiting 04/14/2022    Diarrhea 04/14/2022   Elevated liver enzymes 04/14/2022   Osteomyelitis of second toe of left foot ([place]) 12/26/2020   Acute osteomyelitis of metatarsal bone of left foot ([place]) 12/26/2020   Healthcare maintenance 10/27/2020   Planned postoperative wound closure    Cellulitis and abscess of toe of left foot    Diabetic foot infection ([place])    Abscess of left foot    Osteomyelitis ([place]) 09/21/2020   Type 2 diabetes mellitus ([place]) 01/19/2015   Hyperlipidemia 06/12/2011   Hypertension 06/12/2011     Health Maintenance Due  Topic Date Due   COVID-19 Vaccine (1) Never done   OPHTHALMOLOGY EXAM  Never done   DTaP/Tdap/Td (1 - Tdap) Never done   COLONOSCOPY (Pts 45-29yrs Insurance coverage will need to be confirmed)  Never done   Zoster Vaccines- Shingrix (1 of 2) Never done   INFLUENZA VACCINE  10/11/2021     Review of Systems  Constitutional: Negative for fever, chills, diaphoresis, activity change, appetite change, fatigue and unexpected weight change.  HENT: Negative for congestion, sore throat, rhinorrhea, sneezing, trouble swallowing and sinus pressure.  Eyes: Negative for photophobia and visual disturbance.  Respiratory: Negative for cough, chest tightness, shortness of breath, wheezing and stridor.  Cardiovascular: Negative for chest pain, palpitations and leg swelling.  Gastrointestinal: Negative for nausea, vomiting, abdominal pain, diarrhea, constipation, blood in  stool, abdominal distention and anal bleeding.  Genitourinary: Negative for dysuria, hematuria, flank pain and difficulty urinating.  Musculoskeletal: Negative for myalgias, back pain, joint swelling, arthralgias and gait problem.  Skin: Negative for color change, pallor, rash and wound.  Neurological: Negative for dizziness, tremors, weakness and light-headedness.  Hematological: Negative for adenopathy. Does not bruise/bleed easily.  Psychiatric/Behavioral: Negative for behavioral problems, confusion, sleep  disturbance, dysphoric mood, decreased concentration and agitation.   Physical Exam   BP (!) 146/83   Pulse 80   Temp 98.2 F (36.8 C) (Temporal)   Ht 5\' 10"  (1.778 m)   Wt 265 lb (120.2 kg)   SpO2 96%   BMI 38.02 kg/m    Physical Exam  Constitutional: He is oriented to person, place, and time. He appears well-developed and well-nourished. No distress.  HENT:  Mouth/Throat: Oropharynx is clear and moist. No oropharyngeal exudate.  Cardiovascular: Normal rate, regular rhythm and normal heart sounds. Exam reveals no gallop and no friction rub.  No murmur heard.  Pulmonary/Chest: Effort normal and breath sounds normal. No respiratory distress. He has no wheezes.  Abdominal: Soft. Bowel sounds are normal. He exhibits no distension. There is no tenderness.  Lymphadenopathy:  He has no cervical adenopathy.  Neurological: He is alert and oriented to person, place, and time.  Skin: Skin is warm and dry. No rash noted. No erythema.  Psychiatric: He has a normal mood and affect. His behavior is normal.   CBC Lab Results  Component Value Date   WBC 7.6 05/23/2022   RBC 3.98 (L) 05/23/2022   HGB 11.0 (L) 05/23/2022   HCT 33.0 (L) 05/23/2022   PLT 229.0 05/23/2022   MCV 82.8 05/23/2022   MCH 28.5 04/19/2022   MCHC 33.3 05/23/2022   RDW 16.7 (H) 05/23/2022   LYMPHSABS 2.0 05/23/2022   MONOABS 0.5 05/23/2022   EOSABS 0.7 05/23/2022    BMET Lab Results  Component Value Date   NA 135 05/23/2022   K 4.3 05/23/2022   CL 101 05/23/2022   CO2 24 05/23/2022   GLUCOSE 126 (H) 05/23/2022   BUN 24 (H) 05/23/2022   CREATININE 0.78 05/23/2022   CALCIUM 9.8 05/23/2022   GFRNONAA >60 04/22/2022   GFRAA >89 08/17/2015   Lab Results  Component Value Date   ESRSEDRATE 29 (H) 06/01/2022   Lab Results  Component Value Date   CRP 1.8 06/01/2022      Assessment and Plan MSSA discitis = continue with oral abtx since completed IV abtx. Will pull picc line today Cefadroxil cost  prohibits. Will give keflex 500mg  qid x 8 wks. For discitis Will check labs -- still slightly elevated See back in 4-6 wk

## 2022-06-02 ENCOUNTER — Other Ambulatory Visit: Payer: Self-pay | Admitting: Student

## 2022-06-02 DIAGNOSIS — I10 Essential (primary) hypertension: Secondary | ICD-10-CM

## 2022-06-02 LAB — CBC WITH DIFFERENTIAL/PLATELET
Absolute Monocytes: 428 cells/uL (ref 200–950)
Basophils Absolute: 50 cells/uL (ref 0–200)
Basophils Relative: 0.8 %
Eosinophils Absolute: 378 cells/uL (ref 15–500)
Eosinophils Relative: 6.1 %
HCT: 35.1 % — ABNORMAL LOW (ref 38.5–50.0)
Hemoglobin: 11.5 g/dL — ABNORMAL LOW (ref 13.2–17.1)
Lymphs Abs: 1829 cells/uL (ref 850–3900)
MCH: 27.6 pg (ref 27.0–33.0)
MCHC: 32.8 g/dL (ref 32.0–36.0)
MCV: 84.2 fL (ref 80.0–100.0)
MPV: 10.9 fL (ref 7.5–12.5)
Monocytes Relative: 6.9 %
Neutro Abs: 3515 cells/uL (ref 1500–7800)
Neutrophils Relative %: 56.7 %
Platelets: 201 10*3/uL (ref 140–400)
RBC: 4.17 10*6/uL — ABNORMAL LOW (ref 4.20–5.80)
RDW: 15.7 % — ABNORMAL HIGH (ref 11.0–15.0)
Total Lymphocyte: 29.5 %
WBC: 6.2 10*3/uL (ref 3.8–10.8)

## 2022-06-02 LAB — BASIC METABOLIC PANEL
BUN/Creatinine Ratio: 31 (calc) — ABNORMAL HIGH (ref 6–22)
BUN: 20 mg/dL (ref 7–25)
CO2: 24 mmol/L (ref 20–32)
Calcium: 9.2 mg/dL (ref 8.6–10.3)
Chloride: 105 mmol/L (ref 98–110)
Creat: 0.65 mg/dL — ABNORMAL LOW (ref 0.70–1.30)
Glucose, Bld: 173 mg/dL — ABNORMAL HIGH (ref 65–99)
Potassium: 4.5 mmol/L (ref 3.5–5.3)
Sodium: 139 mmol/L (ref 135–146)

## 2022-06-02 LAB — C-REACTIVE PROTEIN: CRP: 1.8 mg/L (ref <8.0)

## 2022-06-02 LAB — SEDIMENTATION RATE: Sed Rate: 29 mm/h — ABNORMAL HIGH (ref 0–20)

## 2022-06-05 ENCOUNTER — Other Ambulatory Visit: Payer: Self-pay | Admitting: Student

## 2022-06-05 DIAGNOSIS — E119 Type 2 diabetes mellitus without complications: Secondary | ICD-10-CM

## 2022-07-02 ENCOUNTER — Other Ambulatory Visit: Payer: Self-pay | Admitting: Student

## 2022-07-02 DIAGNOSIS — I10 Essential (primary) hypertension: Secondary | ICD-10-CM

## 2022-07-04 ENCOUNTER — Other Ambulatory Visit: Payer: Self-pay

## 2022-07-04 ENCOUNTER — Encounter: Payer: Self-pay | Admitting: Internal Medicine

## 2022-07-04 ENCOUNTER — Ambulatory Visit (INDEPENDENT_AMBULATORY_CARE_PROVIDER_SITE_OTHER): Payer: Self-pay | Admitting: Internal Medicine

## 2022-07-04 VITALS — BP 145/77 | HR 74 | Temp 97.9°F | Ht 70.0 in | Wt 278.0 lb

## 2022-07-04 DIAGNOSIS — I10 Essential (primary) hypertension: Secondary | ICD-10-CM

## 2022-07-04 DIAGNOSIS — Z87891 Personal history of nicotine dependence: Secondary | ICD-10-CM

## 2022-07-04 DIAGNOSIS — M4646 Discitis, unspecified, lumbar region: Secondary | ICD-10-CM

## 2022-07-04 DIAGNOSIS — B359 Dermatophytosis, unspecified: Secondary | ICD-10-CM

## 2022-07-04 NOTE — Progress Notes (Signed)
Patient ID: Carl [name], male   DOB: 01-30-1966, 57 y.o.   MRN: [id number]  HPI 57yo Mwith T2DM, and HTN, HLD, who was admitted in February for MSSA bacteremia complicated by lumbar discitis. Occasional pain with excessive standing Ease up when resting -back pain Tumeric helps No issues keflex   [place]   Outpatient Encounter Medications as of 07/04/2022  Medication Sig   acetaminophen (TYLENOL) 325 MG tablet Take 2 tablets (650 mg total) by mouth every 6 (six) hours as needed for mild pain or fever.   amLODipine (NORVASC) 10 MG tablet Take 1 tablet by mouth once daily   aspirin EC 81 MG tablet Take 1 tablet (81 mg total) by mouth daily. Swallow whole.   atorvastatin (LIPITOR) 20 MG tablet Take 1 tablet by mouth once daily   blood glucose meter kit and supplies KIT Dispense based on patient and insurance preference. Use up to four times daily as directed. (FOR ICD-9 250.00, 250.01).   cephALEXin (KEFLEX) 500 MG capsule Take 1 capsule (500 mg total) by mouth 4 (four) times daily.   glipiZIDE (GLUCOTROL XL) 5 MG 24 hr tablet Take 1 tablet (5 mg total) by mouth daily.   metFORMIN (GLUCOPHAGE-XR) 500 MG 24 hr tablet TAKE 2 TABLETS BY MOUTH TWICE DAILY WITH A MEAL   Multiple Vitamin (MULTIVITAMIN WITH MINERALS) TABS tablet Take 1 tablet by mouth daily.   furosemide (LASIX) 40 MG tablet Take 1 tablet (40 mg total) by mouth daily for 5 days.   potassium chloride SA (KLOR-CON M) 20 MEQ tablet Take 1 tablet (20 mEq total) by mouth daily for 5 days.   No facility-administered encounter medications on file as of 07/04/2022.     Patient Active Problem List   Diagnosis Date Noted   Staphylococcus aureus bacteremia 04/18/2022   Hypoxia 04/18/2022   Thrombocytopenia 04/18/2022   Acute lower UTI 04/17/2022   AKI (acute kidney injury) 04/14/2022   Hyponatremia 04/14/2022   UTI (urinary tract infection) 04/14/2022   Nausea and vomiting 04/14/2022   Diarrhea  04/14/2022   Elevated liver enzymes 04/14/2022   Osteomyelitis of second toe of left foot 12/26/2020   Acute osteomyelitis of metatarsal bone of left foot 12/26/2020   Healthcare maintenance 10/27/2020   Planned postoperative wound closure    Cellulitis and abscess of toe of left foot    Diabetic foot infection    Abscess of left foot    Osteomyelitis 09/21/2020   Type 2 diabetes mellitus 01/19/2015   Hyperlipidemia 06/12/2011   Hypertension 06/12/2011     Health Maintenance Due  Topic Date Due   COVID-19 Vaccine (1) Never done   OPHTHALMOLOGY EXAM  Never done   DTaP/Tdap/Td (1 - Tdap) Never done   COLONOSCOPY (Pts 45-72yrs Insurance coverage will need to be confirmed)  Never done   Zoster Vaccines- Shingrix (1 of 2) Never done     Review of Systems 12 point ros is negative except what is mentioned above Physical Exam   BP (!) 159/81   Pulse 74   Temp 97.9 F (36.6 C) (Oral)   Ht 5\' 10"  (1.778 m)   Wt 278 lb (126.1 kg)   SpO2 98%   BMI 39.89 kg/m    Physical Exam  Constitutional: He is oriented to person, place, and time. He appears well-developed and well-nourished. No distress.  HENT:  Mouth/Throat: Oropharynx is clear and moist. No oropharyngeal exudate.  Cardiovascular: Normal rate, regular rhythm and normal  heart sounds. Exam reveals no gallop and no friction rub.  No murmur heard.  Pulmonary/Chest: Effort normal and breath sounds normal. No respiratory distress. He has no wheezes.  Abdominal: Soft. Bowel sounds are normal. He exhibits no distension. There is no tenderness.  Lymphadenopathy:  He has no cervical adenopathy.  Neurological: He is alert and oriented to person, place, and time.  Skin: Skin is warm and dry. No rash noted. No erythema.  Psychiatric: He has a normal mood and affect. His behavior is normal.   Lab Results  Component Value Date   LABRPR NON REAC 06/18/2013    CBC Lab Results  Component Value Date   WBC 6.2 06/01/2022   RBC 4.17  (L) 06/01/2022   HGB 11.5 (L) 06/01/2022   HCT 35.1 (L) 06/01/2022   PLT 201 06/01/2022   MCV 84.2 06/01/2022   MCH 27.6 06/01/2022   MCHC 32.8 06/01/2022   RDW 15.7 (H) 06/01/2022   LYMPHSABS 1,829 06/01/2022   MONOABS 0.5 05/23/2022   EOSABS 378 06/01/2022    BMET Lab Results  Component Value Date   NA 139 06/01/2022   K 4.5 06/01/2022   CL 105 06/01/2022   CO2 24 06/01/2022   GLUCOSE 173 (H) 06/01/2022   BUN 20 06/01/2022   CREATININE 0.65 (L) 06/01/2022   CALCIUM 9.2 06/01/2022   GFRNONAA >60 04/22/2022   GFRAA >89 08/17/2015   Lab Results  Component Value Date   ESRSEDRATE 29 (H) 06/01/2022   Lab Results  Component Value Date   CRP 1.8 06/01/2022      Assessment and Plan  Hypertension = not at goal, Will check BP - and he reports that he Just received refill Will check sed rate and crp- may consider observing off of abtx  Ringworm = will recommend antifungal cream

## 2022-07-05 LAB — SEDIMENTATION RATE: Sed Rate: 28 mm/h — ABNORMAL HIGH (ref 0–20)

## 2022-07-05 LAB — C-REACTIVE PROTEIN: CRP: <3 mg/L (ref <8.0)

## 2022-08-03 ENCOUNTER — Ambulatory Visit (INDEPENDENT_AMBULATORY_CARE_PROVIDER_SITE_OTHER): Payer: Self-pay | Admitting: Internal Medicine

## 2022-08-03 ENCOUNTER — Other Ambulatory Visit: Payer: Self-pay

## 2022-08-03 ENCOUNTER — Encounter: Payer: Self-pay | Admitting: Internal Medicine

## 2022-08-03 VITALS — BP 169/83 | HR 73 | Resp 16 | Ht 70.0 in | Wt 278.0 lb

## 2022-08-03 DIAGNOSIS — I10 Essential (primary) hypertension: Secondary | ICD-10-CM

## 2022-08-03 DIAGNOSIS — R7881 Bacteremia: Secondary | ICD-10-CM

## 2022-08-03 DIAGNOSIS — B9561 Methicillin susceptible Staphylococcus aureus infection as the cause of diseases classified elsewhere: Secondary | ICD-10-CM

## 2022-08-03 DIAGNOSIS — M4646 Discitis, unspecified, lumbar region: Secondary | ICD-10-CM

## 2022-08-03 NOTE — Progress Notes (Signed)
Rfv: follow for lumbar discitis  Patient ID: Carl [name], male   DOB: 1966-01-22, 57 y.o.   MRN: [id number]  HPI Carl [name] is a 57yo M with T2DM, and HTN, HLD, who was admitted in February for MSSA bacteremia complicated by lumbar discitis. Occasional pain with excessive standing BP elevated -- takes BP meds daily. Last seen when he got out of hospital Finished abtx this week  Outpatient Encounter Medications as of 08/03/2022  Medication Sig   acetaminophen (TYLENOL) 325 MG tablet Take 2 tablets (650 mg total) by mouth every 6 (six) hours as needed for mild pain or fever.   amLODipine (NORVASC) 10 MG tablet Take 1 tablet by mouth once daily   aspirin EC 81 MG tablet Take 1 tablet (81 mg total) by mouth daily. Swallow whole.   atorvastatin (LIPITOR) 20 MG tablet Take 1 tablet by mouth once daily   blood glucose meter kit and supplies KIT Dispense based on patient and insurance preference. Use up to four times daily as directed. (FOR ICD-9 250.00, 250.01).   glipiZIDE (GLUCOTROL XL) 5 MG 24 hr tablet Take 1 tablet (5 mg total) by mouth daily.   metFORMIN (GLUCOPHAGE-XR) 500 MG 24 hr tablet TAKE 2 TABLETS BY MOUTH TWICE DAILY WITH A MEAL   Multiple Vitamin (MULTIVITAMIN WITH MINERALS) TABS tablet Take 1 tablet by mouth daily.   cephALEXin (KEFLEX) 500 MG capsule Take 1 capsule (500 mg total) by mouth 4 (four) times daily. (Patient not taking: Reported on 08/03/2022)   furosemide (LASIX) 40 MG tablet Take 1 tablet (40 mg total) by mouth daily for 5 days. (Patient not taking: Reported on 08/03/2022)   potassium chloride SA (KLOR-CON M) 20 MEQ tablet Take 1 tablet (20 mEq total) by mouth daily for 5 days.   No facility-administered encounter medications on file as of 08/03/2022.     Patient Active Problem List   Diagnosis Date Noted   Staphylococcus aureus bacteremia 04/18/2022   Hypoxia 04/18/2022   Thrombocytopenia (HCC) 04/18/2022   Acute lower UTI 04/17/2022   AKI (acute kidney injury) (HCC)  04/14/2022   Hyponatremia 04/14/2022   UTI (urinary tract infection) 04/14/2022   Nausea and vomiting 04/14/2022   Diarrhea 04/14/2022   Elevated liver enzymes 04/14/2022   Osteomyelitis of second toe of left foot (HCC) 12/26/2020   Acute osteomyelitis of metatarsal bone of left foot (HCC) 12/26/2020   Healthcare maintenance 10/27/2020   Planned postoperative wound closure    Cellulitis and abscess of toe of left foot    Diabetic foot infection (HCC)    Abscess of left foot    Osteomyelitis (HCC) 09/21/2020   Type 2 diabetes mellitus (HCC) 01/19/2015   Hyperlipidemia 06/12/2011   Hypertension 06/12/2011     Health Maintenance Due  Topic Date Due   COVID-19 Vaccine (1) Never done   OPHTHALMOLOGY EXAM  Never done   DTaP/Tdap/Td (1 - Tdap) Never done   Colonoscopy  Never done   Zoster Vaccines- Shingrix (1 of 2) Never done     Review of Systems Review of Systems  Constitutional: Negative for fever, chills, diaphoresis, activity change, appetite change, fatigue and unexpected weight change.  HENT: Negative for congestion, sore throat, rhinorrhea, sneezing, trouble swallowing and sinus pressure.  Eyes: Negative for photophobia and visual disturbance.  Respiratory: Negative for cough, chest tightness, shortness of breath, wheezing and stridor.  Cardiovascular: Negative for chest pain, palpitations and leg swelling.  Gastrointestinal: Negative for nausea, vomiting, abdominal pain, diarrhea, constipation, blood in stool,  abdominal distention and anal bleeding.  Genitourinary: Negative for dysuria, hematuria, flank pain and difficulty urinating.  Musculoskeletal: Negative for myalgias, back pain, joint swelling, arthralgias and gait problem.  Skin: Negative for color change, pallor, rash and wound.  Neurological: Negative for dizziness, tremors, weakness and light-headedness.  Hematological: Negative for adenopathy. Does not bruise/bleed easily.  Psychiatric/Behavioral: Negative for  behavioral problems, confusion, sleep disturbance, dysphoric mood, decreased concentration and agitation.   Physical Exam   BP (!) 169/83   Pulse 73   Resp 16   Ht 5\' 10"  (1.778 m)   Wt 278 lb (126.1 kg)   SpO2 97%   BMI 39.89 kg/m   Physical Exam  Constitutional: He is oriented to person, place, and time. He appears well-developed and well-nourished. No distress.  HENT:  Mouth/Throat: Oropharynx is clear and moist. No oropharyngeal exudate.  Cardiovascular: Normal rate, regular rhythm and normal heart sounds. Exam reveals no gallop and no friction rub.  No murmur heard.  Pulmonary/Chest: Effort normal and breath sounds normal. No respiratory distress. He has no wheezes.  Abdominal: Soft. Bowel sounds are normal. He exhibits no distension. There is no tenderness.  Lymphadenopathy:  He has no cervical adenopathy.  Neurological: He is alert and oriented to person, place, and time.  Skin: Skin is warm and dry. No rash noted. No erythema.  Psychiatric: He has a normal mood and affect. His behavior is normal.    Lab Results  Component Value Date   LABRPR NON REAC 06/18/2013    CBC Lab Results  Component Value Date   WBC 6.2 06/01/2022   RBC 4.17 (L) 06/01/2022   HGB 11.5 (L) 06/01/2022   HCT 35.1 (L) 06/01/2022   PLT 201 06/01/2022   MCV 84.2 06/01/2022   MCH 27.6 06/01/2022   MCHC 32.8 06/01/2022   RDW 15.7 (H) 06/01/2022   LYMPHSABS 1,829 06/01/2022   MONOABS 0.5 05/23/2022   EOSABS 378 06/01/2022    BMET Lab Results  Component Value Date   NA 139 06/01/2022   K 4.5 06/01/2022   CL 105 06/01/2022   CO2 24 06/01/2022   GLUCOSE 173 (H) 06/01/2022   BUN 20 06/01/2022   CREATININE 0.65 (L) 06/01/2022   CALCIUM 9.2 06/01/2022   GFRNONAA >60 04/22/2022   GFRAA >89 08/17/2015      Assessment and Plan  MSSA bacteremia, with lumbar discitis = He has completed finished 12-14 weeks of treatment for discitis. Appears improved. No signs of further  infection.  Hypertension = not at goal. Recommend to get BP machine to tcheck at home as well as to follow up with his PCP for better management  Rtc prn

## 2022-08-29 ENCOUNTER — Other Ambulatory Visit: Payer: Self-pay | Admitting: Student

## 2022-08-29 DIAGNOSIS — E119 Type 2 diabetes mellitus without complications: Secondary | ICD-10-CM

## 2022-09-16 ENCOUNTER — Other Ambulatory Visit: Payer: Self-pay | Admitting: Student

## 2022-09-16 DIAGNOSIS — E11621 Type 2 diabetes mellitus with foot ulcer: Secondary | ICD-10-CM

## 2022-11-16 ENCOUNTER — Other Ambulatory Visit (HOSPITAL_COMMUNITY): Payer: Self-pay

## 2022-11-24 ENCOUNTER — Other Ambulatory Visit: Payer: Self-pay | Admitting: Student

## 2022-11-24 DIAGNOSIS — E119 Type 2 diabetes mellitus without complications: Secondary | ICD-10-CM

## 2022-12-16 ENCOUNTER — Other Ambulatory Visit: Payer: Self-pay | Admitting: Student

## 2022-12-16 DIAGNOSIS — E11621 Type 2 diabetes mellitus with foot ulcer: Secondary | ICD-10-CM

## 2023-02-21 ENCOUNTER — Other Ambulatory Visit: Payer: Self-pay | Admitting: Student

## 2023-02-21 DIAGNOSIS — E119 Type 2 diabetes mellitus without complications: Secondary | ICD-10-CM

## 2023-05-20 ENCOUNTER — Other Ambulatory Visit: Payer: Self-pay | Admitting: Student

## 2023-05-20 DIAGNOSIS — E119 Type 2 diabetes mellitus without complications: Secondary | ICD-10-CM

## 2023-08-17 ENCOUNTER — Other Ambulatory Visit: Payer: Self-pay | Admitting: Student

## 2023-08-17 DIAGNOSIS — E119 Type 2 diabetes mellitus without complications: Secondary | ICD-10-CM

## 2023-08-21 ENCOUNTER — Encounter: Payer: Self-pay | Admitting: *Deleted

## 2023-10-12 ENCOUNTER — Other Ambulatory Visit: Payer: Self-pay | Admitting: *Deleted

## 2023-10-12 DIAGNOSIS — I10 Essential (primary) hypertension: Secondary | ICD-10-CM

## 2023-10-15 MED ORDER — AMLODIPINE BESYLATE 10 MG PO TABS: 10.0000 mg | ORAL_TABLET | Freq: Every day | ORAL | 3 refills | Status: DC

## 2023-10-19 ENCOUNTER — Telehealth: Payer: Self-pay

## 2023-10-19 NOTE — Telephone Encounter (Signed)
 Called patient to schedule physical, LVM to call back.   Thanks!

## 2023-10-31 ENCOUNTER — Encounter: Payer: Self-pay | Admitting: Podiatry

## 2023-10-31 ENCOUNTER — Ambulatory Visit (INDEPENDENT_AMBULATORY_CARE_PROVIDER_SITE_OTHER): Payer: Self-pay | Admitting: Podiatry

## 2023-10-31 VITALS — BP 160/75 | HR 77 | Temp 98.4°F

## 2023-10-31 DIAGNOSIS — E11621 Type 2 diabetes mellitus with foot ulcer: Secondary | ICD-10-CM

## 2023-10-31 DIAGNOSIS — L97422 Non-pressure chronic ulcer of left heel and midfoot with fat layer exposed: Secondary | ICD-10-CM

## 2023-10-31 NOTE — Progress Notes (Signed)
  Subjective:  Patient ID: Carl [name], male    DOB: 02/24/1966,   MRN: [id number]  Chief Complaint  Patient presents with   Diabetic Ulcer    It's not good.  There was a blister on the bottom and it popped.  It does not want to close up.  Saw Dr. Marlee - about 1 year; A1c - ?    58 y.o. male presents for new concern of blister on the bottom of his foot. Never followed up after healed his left foot wound previously. 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  Relate its has been present for 6 months. He has been keeping it dressed with betadine. He has been in regular shoes.  Relates burning and tingling in their feet. Patient is diabetic and last A1c was  Lab Results  Component Value Date   HGBA1C 7.8 (H) 04/14/2022   .   PCP:  Larraine Palma, MD    . Denies any other pedal complaints. Denies n/v/f/c.   Past Medical History:  Diagnosis Date   Diabetes mellitus without complication (HCC)    Hyperlipidemia    Hypertension    Obesity     Objective:  Physical Exam: Vascular: DP/PT pulses 2/4 bilateral. CFT <3 seconds. Absent hair growth and edema noted bilateral.  Skin. No lacerations or abrasions bilateral feet. Ulceration noted to plantar left midfoot with granular base. No erythema edema or purulence noted. No probe to bone. Measurements below  Musculoskeletal: MMT 5/5 bilateral lower extremities in DF, PF, Inversion and Eversion. Deceased ROM in DF of ankle joint. Left transmetatarsal amputation.  Neurological: Sensation intact to light touch. Protective sensation diminished.   Assessment:   1. Diabetic ulcer of left midfoot associated with type 2 diabetes mellitus, with fat layer exposed (HCC)      Plan:  Patient was evaluated and treated and all questions answered. Ulcer plantar left midfoot with fat layer exposed  -Debridement as below. -Dressed with betadine, DSD. -Off-loading with CAM boot dispensed.  -No abx indicated.  -Discussed glucose control and proper protein-rich diet.   -Discussed if any worsening redness, pain, fever or chills to call or may need to report to the emergency room. Patient expressed understanding.   Procedure: Excisional Debridement of Wound Rationale: Removal of non-viable soft tissue from the wound to promote healing  Anesthesia: none Pre-Debridement Wound Measurements: 2cm x 2 cmx 0.3 cm  Post-Debridement Wound Measurements: 2.1 cm x 2.2 cm x 0.4 cm  Type of Debridement: Sharp Excisional Tissue Removed: Non-viable soft tissue and necrotic debris. Minimal blood loss.  Depth of Debridement: subcutaneous tissue about 0.1 cm  Technique: Sharp excisional debridement to bleeding, viable wound base with #15 scalpel.  Dressing: Dry, sterile, compression dressing. Disposition: Patient tolerated procedure well. Patient to return in 2 week for follow-up. Will need debridement every two weeks until closure.   Return in about 2 weeks (around 11/14/2023) for wound check.   Asberry Failing, DPM

## 2023-11-07 ENCOUNTER — Encounter: Payer: Self-pay | Admitting: Internal Medicine

## 2023-11-19 ENCOUNTER — Ambulatory Visit (INDEPENDENT_AMBULATORY_CARE_PROVIDER_SITE_OTHER): Payer: MEDICAID | Admitting: Podiatry

## 2023-11-19 ENCOUNTER — Encounter: Payer: Self-pay | Admitting: Podiatry

## 2023-11-19 VITALS — BP 167/78 | HR 79 | Temp 98.1°F

## 2023-11-19 DIAGNOSIS — E11621 Type 2 diabetes mellitus with foot ulcer: Secondary | ICD-10-CM

## 2023-11-19 DIAGNOSIS — L97422 Non-pressure chronic ulcer of left heel and midfoot with fat layer exposed: Secondary | ICD-10-CM

## 2023-11-19 NOTE — Progress Notes (Signed)
 Subjective:  Patient ID: Carl [name], male    DOB: March 07, 1966,   MRN: [id number]  Chief Complaint  Patient presents with   Diabetic Ulcer    It's doing good.  Saw Dr. Marlee - about 1 year; A1c - ?    58 y.o. male presents for follow-up of left foot wound. Relates doing well and dressing as instructed. 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 He has been keeping it dressed with betadine. He has been in regular shoes.  Relates burning and tingling in their feet. Patient is diabetic and last A1c was  Lab Results  Component Value Date   HGBA1C 7.8 (H) 04/14/2022   .   PCP:  Larraine Palma, MD    . Denies any other pedal complaints. Denies n/v/f/c.   Past Medical History:  Diagnosis Date   Diabetes mellitus without complication (HCC)    Hyperlipidemia    Hypertension    Obesity     Objective:  Physical Exam: Vascular: DP/PT pulses 2/4 bilateral. CFT <3 seconds. Absent hair growth and edema noted bilateral.  Skin. No lacerations or abrasions bilateral feet. Ulceration noted to plantar left midfoot with granular base. No erythema edema or purulence noted. No probe to bone. Measurements below  Musculoskeletal: MMT 5/5 bilateral lower extremities in DF, PF, Inversion and Eversion. Deceased ROM in DF of ankle joint. Left transmetatarsal amputation.  Neurological: Sensation intact to light touch. Protective sensation diminished.   Assessment:   1. Diabetic ulcer of left midfoot associated with type 2 diabetes mellitus, with fat layer exposed (HCC)      Plan:  Patient was evaluated and treated and all questions answered. Ulcer plantar left midfoot with fat layer exposed  -Debridement as below. -Dressed with betadine, DSD. -Off-loading with CAM boot dispensed.  -No abx indicated.  -Discussed glucose control and proper protein-rich diet.  -Discussed if any worsening redness, pain, fever or chills to call or may need to report to the emergency room. Patient expressed understanding.     Procedure: Excisional Debridement of Wound Tool: Sharp #312 chisel blade/tissue nipper Type of Debridement: Sharp Excisional Frequency: Every two weeks until appropriately healed.  Dressing is to be changed daily/keeping the wound clean and dry Rationale: Removal of non-viable soft tissue from the wound to promote healing.  Anesthesia: none Pre-Debridement Wound Measurements: 2.2 cm x 1.5 cm x 0.3 cm  Post-Debridement Wound Measurements: 2.4 cm x 1.8 cm x 0.4 cm  Area devitalized tissue removed(nonviable tissue only): 0.2 cm x 0.3 cm.  Blood loss: Minimal (<10cc) Depth of Debridement: with fat layer exposed Description of tissue removed: Slough, Devitalized Tissue, and Non-viable tissue Technique: The wound and the surrounding skin were prepped and draped in usual aseptic fashion.  Aseptic technique was maintained throughout the procedure.  Using #312 blade/tissue nipper sharp debridement of necrotic/nonviable tissue was performed until healthy bleeding wound bed was achieved.  No underlying bone or tendon was exposed during debridement.  The wound was thoroughly irrigated with normal saline solution Wound Progress:  Current Wound Volume: Debridement was performed of the chronic nonhealing diabetic foot wound on plantar left foot .  Debridement removed 0.2 cm x 0.3 cm of the necrotic tissue and subcutaneous tissue and  purulent drainage was not present. Presence/absence of tissue: Necrotic tissue/nonviable tissue present at the base of the wound.  Sharp debridement was performed to remove the necrotic tissue/nonviable tissue back to viable tissue.  No devitalized/nonviable tissue present postdebridement.  Wound appeared clean and clear of infection No material  in the wound was present that was identified to be inhibiting healing. Dressing: Dry, sterile, compression dressing. Disposition: Patient tolerated procedure well. Patient to return in 2 weeks for follow-up or as listed above.  No  follow-ups on file.   No follow-ups on file.   Asberry Failing, DPM

## 2023-12-04 ENCOUNTER — Other Ambulatory Visit: Payer: Self-pay | Admitting: *Deleted

## 2023-12-04 DIAGNOSIS — E11621 Type 2 diabetes mellitus with foot ulcer: Secondary | ICD-10-CM

## 2023-12-04 DIAGNOSIS — D649 Anemia, unspecified: Secondary | ICD-10-CM | POA: Insufficient documentation

## 2023-12-04 MED ORDER — GLIPIZIDE ER 5 MG PO TB24: 5.0000 mg | ORAL_TABLET | Freq: Every day | ORAL | 3 refills | Status: DC

## 2023-12-04 NOTE — Assessment & Plan Note (Signed)
 On atorvastatin  20 mg daily. - Lipid panel pending

## 2023-12-04 NOTE — Progress Notes (Unsigned)
    SUBJECTIVE:   Chief compliant/HPI: annual examination  Carl [name] is a 58 y.o. who presents today for an annual exam.   Concerns include: ***  PMHx of: HTN, T2DM with foot ulcer, HLD FHx significant for: ***  Diet: *** Exercise: *** Sleep: *** Mood: ***  Social Hx Alcohol use: *** Tobacco use: *** Substance use: *** Relationship: *** Sexually active: *** Job/Education: ***  Healthcare Maintenance: - Flu vaccine - Covid booster - Shingrix - Hep B Vaccine - Pneumococcal vaccine - Tdap booster - Diabetic eye exam - Colonoscopy  History tabs reviewed and updated.   Review of systems form reviewed and notable for ***.   OBJECTIVE:   There were no vitals taken for this visit.  ***  ASSESSMENT/PLAN:   Assessment & Plan Annual physical exam  Type 2 diabetes mellitus with foot ulcer, without long-term current use of insulin  (HCC) A1c today of ***. On Glipizide  5 mg daily, metformin  500 mg daily. - Labs: BMP, urine alb/Cr pending Anemia, unspecified type - CBC pending Primary hypertension On amlodipine  10 mg daily. - BMP pending Hyperlipidemia, unspecified hyperlipidemia type On atorvastatin  20 mg daily - Lipid panel pending  Annual Examination  See AVS for age appropriate recommendations.  PHQ score ***, reviewed and discussed.  Blood pressure value is *** goal, discussed.   Considered the following screening exams based upon USPSTF recommendations: Diabetes screening: {FMCANNUALORDERED:33692} HIV testing:{FMCANNUALORDERED:33692} Hepatitis C: {FMCANNUALORDERED:33692} Hepatitis B:{FMCANNUALORDERED:33692} Syphilis if at high risk: {FMCANNUALORDERED:33692} GC/CT {GC/CT screening :23818} Lipid panel (nonfasting or fasting) discussed based upon AHA recommendations and {FMCLIPID:33694}.  Consider repeat every 4-6 years.  Reviewed risk factors for latent tuberculosis and not indicated  Cancer Screening Discussion  Lung cancer  screening:{FMCLUNGCANCERSCREENIN:33695}.  See documentation below regarding indications/risks/benefits.  Colorectal cancer screening: {crcscreen:23821::discussed, colonoscopy ordered}.  PSA discussed and after engaging in discussion of possible risks, benefits and complications of screening. PSA: {not indicated/requested/declined:14582} Vaccinations ***.   Follow up in 1 year or sooner if indicated.  MyChart Activation: Already signed up   Alan Flies, MD [facility]

## 2023-12-04 NOTE — Assessment & Plan Note (Signed)
 A1c today of 9.8. On Glipizide  XR 5 mg daily, metformin  500 mg daily.  Discussed injectables, but patient reports these are too expensive for him at this time. - Encouraged continued reduction of carbs and sugars in diet and regular exercise - INCREASE metformin  to 500 mg twice daily - SWITCH glipizide  XR to glipizide  5 mg daily - Labs: BMP, urine alb/Cr pending - Referral placed to ophtho for DM eye exam - Follow-up scheduled with Dr. Koval on 10/3 - Follow-up scheduled with me on 10/31

## 2023-12-04 NOTE — Assessment & Plan Note (Signed)
 On amlodipine  10 mg daily. - BMP pending

## 2023-12-04 NOTE — Assessment & Plan Note (Signed)
CBC pending.

## 2023-12-06 ENCOUNTER — Ambulatory Visit (INDEPENDENT_AMBULATORY_CARE_PROVIDER_SITE_OTHER): Payer: Self-pay

## 2023-12-06 VITALS — BP 121/82 | HR 90 | Ht 70.0 in | Wt 274.0 lb

## 2023-12-06 DIAGNOSIS — I10 Essential (primary) hypertension: Secondary | ICD-10-CM

## 2023-12-06 DIAGNOSIS — E785 Hyperlipidemia, unspecified: Secondary | ICD-10-CM

## 2023-12-06 DIAGNOSIS — D649 Anemia, unspecified: Secondary | ICD-10-CM

## 2023-12-06 DIAGNOSIS — L97509 Non-pressure chronic ulcer of other part of unspecified foot with unspecified severity: Secondary | ICD-10-CM

## 2023-12-06 DIAGNOSIS — E119 Type 2 diabetes mellitus without complications: Secondary | ICD-10-CM

## 2023-12-06 DIAGNOSIS — Z0000 Encounter for general adult medical examination without abnormal findings: Secondary | ICD-10-CM

## 2023-12-06 DIAGNOSIS — E11621 Type 2 diabetes mellitus with foot ulcer: Secondary | ICD-10-CM

## 2023-12-06 LAB — POCT GLYCOSYLATED HEMOGLOBIN (HGB A1C): HbA1c, POC (controlled diabetic range): 9.8 % — AB (ref 0.0–7.0)

## 2023-12-06 MED ORDER — GLIPIZIDE 5 MG PO TABS: 5.0000 mg | ORAL_TABLET | Freq: Every day | ORAL | 3 refills | Status: DC

## 2023-12-06 MED ORDER — METFORMIN HCL ER 500 MG PO TB24: 500.0000 mg | ORAL_TABLET | Freq: Two times a day (BID) | ORAL | 0 refills | Status: AC

## 2023-12-06 NOTE — Patient Instructions (Addendum)
 Carl [name],   It was great seeing you in clinic today! You came in for your annual visit.  We discussed a number of things today: - Your sugar level is higher than we like it to be. Because of this, I'm increasing your metformin  to 500 mg twice a day - I am also switching the kind of glipizide  you are on to prevent low blood sugar levels; DO NOT take the extended release (XR) glipizide  - I am having you visit our pharmacist, Dr. Koval, on Fri 10/3 at 9:30am - I will have you follow up with me in 1 month on Fri 10/31 at 1:45pm; hopefully your insurance is approved at this time and we can give you your vaccines!  There are a few things for you to do outside of clinic: - Buy compression socks to help with your edema; also elevate your foot - Think about the colonoscopy - I sent in a referral to ophthalmology for your diabetic eye exam; you should get a call to schedule this appointment in the next 1-2 weeks  Please bring all your medications to your next office visit.  Thank you for allowing me to be a part of your care team! Alan Flies, MD [facility] [address] [place], [place], [place] [address] [phone]

## 2023-12-07 ENCOUNTER — Ambulatory Visit: Payer: Self-pay

## 2023-12-07 DIAGNOSIS — L97509 Non-pressure chronic ulcer of other part of unspecified foot with unspecified severity: Secondary | ICD-10-CM

## 2023-12-07 DIAGNOSIS — E785 Hyperlipidemia, unspecified: Secondary | ICD-10-CM

## 2023-12-07 LAB — LIPID PANEL
Chol/HDL Ratio: 6.3 ratio — ABNORMAL HIGH (ref 0.0–5.0)
Cholesterol, Total: 184 mg/dL (ref 100–199)
HDL: 29 mg/dL — ABNORMAL LOW (ref >39)
LDL Chol Calc (NIH): 116 mg/dL — ABNORMAL HIGH (ref 0–99)
Triglycerides: 222 mg/dL — ABNORMAL HIGH (ref 0–149)
VLDL Cholesterol Cal: 39 mg/dL (ref 5–40)

## 2023-12-07 LAB — CBC
Hematocrit: 39.2 % (ref 37.5–51.0)
Hemoglobin: 12.8 g/dL — ABNORMAL LOW (ref 13.0–17.7)
MCH: 27.6 pg (ref 26.6–33.0)
MCHC: 32.7 g/dL (ref 31.5–35.7)
MCV: 85 fL (ref 79–97)
Platelets: 255 x10E3/uL (ref 150–450)
RBC: 4.63 x10E6/uL (ref 4.14–5.80)
RDW: 15.3 % (ref 11.6–15.4)
WBC: 8.4 x10E3/uL (ref 3.4–10.8)

## 2023-12-07 LAB — BASIC METABOLIC PANEL WITH GFR
BUN/Creatinine Ratio: 19 (ref 9–20)
BUN: 16 mg/dL (ref 6–24)
CO2: 20 mmol/L (ref 20–29)
Calcium: 8.7 mg/dL (ref 8.7–10.2)
Chloride: 101 mmol/L (ref 96–106)
Creatinine, Ser: 0.83 mg/dL (ref 0.76–1.27)
Glucose: 167 mg/dL — ABNORMAL HIGH (ref 70–99)
Potassium: 4.6 mmol/L (ref 3.5–5.2)
Sodium: 139 mmol/L (ref 134–144)
eGFR: 101 mL/min/1.73 (ref >59)

## 2023-12-07 LAB — MICROALBUMIN / CREATININE URINE RATIO
Creatinine, Urine: 95.9 mg/dL
Microalb/Creat Ratio: 1452 mg/g{creat} — ABNORMAL HIGH (ref 0–29)
Microalbumin, Urine: 1392.6 ug/mL

## 2023-12-07 MED ORDER — ATORVASTATIN CALCIUM 40 MG PO TABS: 40.0000 mg | ORAL_TABLET | Freq: Every day | ORAL | 3 refills | Status: AC

## 2023-12-07 NOTE — Progress Notes (Signed)
 Called patient to discuss the results of recent lab work done: - Informed him that his kidney function looked good on the blood work, however his urine lab showed a very high microalbumin/creatinine ratio.  Given this discrepancy, I and wondering if he was not dehydrated prior to getting this urine lab collected.  Will plan to redo the urine alb/Cr in 1 week at patient's follow-up visit with Dr. Koval. - Informed him his hemoglobin level was low, and looks like it has been low for 1 to 2 years.  Will revisit question of colonoscopy at his follow-up with me in a month; will also plan to do iron labs at that time. - Discussed elevated LDL on lipid panel.  Will increase Lipitor to 40 mg daily; instructed patient to take 2 of his current 20 mg tablets, and will send new prescription into his pharmacy. All questions answered.

## 2023-12-10 ENCOUNTER — Ambulatory Visit: Payer: MEDICAID | Admitting: Podiatry

## 2023-12-14 ENCOUNTER — Ambulatory Visit: Payer: Self-pay | Admitting: Pharmacist

## 2023-12-14 ENCOUNTER — Encounter: Payer: Self-pay | Admitting: Pharmacist

## 2023-12-14 VITALS — BP 138/73 | HR 79 | Wt 274.0 lb

## 2023-12-14 DIAGNOSIS — E1159 Type 2 diabetes mellitus with other circulatory complications: Secondary | ICD-10-CM

## 2023-12-14 LAB — POCT GLYCOSYLATED HEMOGLOBIN (HGB A1C): HbA1c, POC (controlled diabetic range): 9.6 % — AB (ref 0.0–7.0)

## 2023-12-14 MED ORDER — BASAGLAR KWIKPEN 100 UNIT/ML ~~LOC~~ SOPN: 10.0000 [IU] | PEN_INJECTOR | SUBCUTANEOUS | Status: DC

## 2023-12-14 NOTE — Patient Instructions (Signed)
 It was nice to see you today!  Your goal blood sugar is 80-130 before eating and less than 180 after eating.  Medication Changes: START Basaglar  (insulin  glargine) 10 units starting today followed by 1 additional unit each day until you reach a fasting reading.   Continue all other medication the same.   Keep up the good work with diet and exercise. Aim for a diet full of vegetables, fruit and lean meats (chicken, [place], fish). Try to limit salt intake by eating fresh or frozen vegetables (instead of canned), rinse canned vegetables prior to cooking and do not add any additional salt to meals.        Sensor Application If using the App, you can tap Help in the Main Menu to access an in-app tutorial on applying a Sensor. See below for instructions on how to download the app. Apply Sensors only on the back of your upper arm. If placed in other areas, the Sensor may not function properly and could give you inaccurate readings. Avoid areas with scars, moles, stretch marks, or lumps.   Select an area of skin that generally stays flat during your normal daily activities (no bending or folding). Choose a site that is at least 1 inch (2.5 cm) away from any injection sites. To prevent discomfort or skin irritation, you should select a different site other than the one most recently used. Wash application site using a plain soap, dry, and then clean with an alcohol wipe. This will help remove any oily residue that may prevent the sensor from sticking properly. Allow site to air dry before proceeding. Note: The area MUST be clean and dry, or the Sensor may not stay on for the full wear duration specified by your Sensor insert. 4. Unscrew the cap from the Sensor Applicator and set the cap aside.  5. Place the Sensor Applicator over the prepared site and push down firmly to apply the Sensor to your body. 6. Gently pull the Sensor Applicator away from your body. The Sensor should now be attached to your  skin. 7. Make sure the Sensor is secure after application. Put the cap back on the Sensor Applicator. Discard the used Engineer, agricultural according to local regulations.  What If My Sensor Falls Off or What If My Sensor Isn't Working? Call [employer] Customer Care Team at [phone] Available 7 days a week from 8AM-8PM EST, excluding holidays If you have multiple sensors fall, contact [facility] at [phone]   The App Download the [employer] App in your phone's app store   Load the app and select get started now Create an account  Tap scan new sensor Follow the prompts on the screen. If your sensor does not sync, try moving your phone slowly around the sensor. Phone cases may affect scanning. This will be the only time you have to scan the sensor until you apply a new sensor.   There will be a 60 minute start up period until the app will display your glucose reading   How To Share Your Readings With Us  Once in the app, go to settings -> connected apps -> [employer] -> Enter Practice ID -> [id number]

## 2023-12-14 NOTE — Progress Notes (Signed)
 S:     Chief Complaint  Patient presents with   Medication Management    Diabetes - Liberate Enrollment    58 y.o. male who presents for diabetes evaluation, education, and management in the context of the LIBERATE Study.  Today, patient arrives in good spirits and presents without any assistance.  Patient was referred and last seen by Primary Care Provider, Dr. Larraine, on 12/06/2023.  At last visit, patient reported lack of insurance and lack of access to medications due to cost.   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   Per patient chart, Diabetes was diagnosed 2013.   Current diabetes medications include: Glipizide  5mg  Immediate-release daily, metformin  500mg  BID Current hypertension medications include: amlodipine  10mg  daily Current hyperlipidemia medications include: atorvastatin  40mg  daily  Patient reports adherence to taking all medications as prescribed.   Do you feel that your medications are working for you? yes Have you been experiencing any side effects to the medications prescribed? no Do you have any problems obtaining medications due to transportation or finances? yes Insurance coverage: none - Medicaid pending  Patient denies hypoglycemic events.  Patient reports self foot exams.   O:   Review of Systems  All other systems reviewed and are negative.   Physical Exam Vitals reviewed.  Constitutional:      Appearance: Normal appearance.  Pulmonary:     Effort: Pulmonary effort is normal.  Skin:    Comments: Left - central plantar surface would ~ 15 mm in diameter.   Neurological:     Mental Status: He is alert.  Psychiatric:        Mood and Affect: Mood normal.        Behavior: Behavior normal.        Thought Content: Thought content normal.        Judgment: Judgment normal.    Lab Results  Component Value Date   HGBA1C 9.6 (A) 12/14/2023   Vitals:   12/14/23 1000  BP:  138/73  Pulse: 79  SpO2: 98%   Lipid Panel     Component Value Date/Time   CHOL 184 12/06/2023 1509   TRIG 222 (H) 12/06/2023 1509   HDL 29 (L) 12/06/2023 1509   CHOLHDL 6.3 (H) 12/06/2023 1509   CHOLHDL 5.0 09/21/2020 1616   VLDL 17 09/21/2020 1616   LDLCALC 116 (H) 12/06/2023 1509   Clinical Atherosclerotic Cardiovascular Disease (ASCVD): No  The 10-year ASCVD risk score (Arnett DK, et al., 2019) is: 26%   Values used to calculate the score:     Age: 68 years     Clincally relevant sex: Male     Is Non-Hispanic African American: No     Diabetic: Yes     Tobacco smoker: No     Systolic Blood Pressure: 138 mmHg     Is BP treated: Yes     HDL Cholesterol: 29 mg/dL     Total Cholesterol: 184 mg/dL    A/P:  LIBERATE Study:  -Patient provided verbal consent to participate in the study. Consent documented in electronic medical record.  -Provided education on Libre 3 CGM. Collaborated to ensure Herlene 3 app was downloaded on patient's phone. Educated on how to place sensor every 14 days, patient placed first sensor correctly and verbalized understanding of use, removal, and how to place next sensor. Discussed alarms. 8 sensors provided for a 3 month supply. Educated to contact the office if the sensor falls off  early and replacements are needed before their next Study Appointment.   Diabetes diagnosed in 2013, with history of transmetatarsal amputation of left foot.  Currently has sub-optimal glucose control with fasting readings >180 routinely. Patient is able to verbalize appropriate hypoglycemia management plan. Medication adherence appears good. Control is suboptimal due to inadequate medication regimen including glipizide  and metformin . - STARTED Basaglar  (insulin  glargine) at 10 units - first dose in office.  Patient will continue to titrate 1 unit every day if fasting blood sugar > 150mg /dl until fasting blood sugars reach goal or next visit.  Provided sample pen of  - Continue  glipizide  daily at this time - until glucose control improves with insulin  use, then plan to D/C - patient instructed to NOT refill.  -Continued metformin  500mg  BID  -Patient educated on purpose, proper use, and potential adverse effects.  -Extensively discussed pathophysiology of diabetes, recommended lifestyle interventions, dietary effects on blood sugar control.  -Counseled on s/sx of and management of hypoglycemia.  -Next A1c anticipated 3 months.   ASCVD risk - primary prevention in patient with diabetes. Last LDL is 116 on 12/06/2023 was not at goal of <70 mg/dL and Atorvastatin  40mg  was started (has taken lower dose atorvastatin  in the past. High intensity statin indicated.  - Continued atorvastatin  40 mg.   Hypertension longstanding and currently above goal blood pressure at 138/73. Blood pressure goal of <130/80 mmHg. Medication adherence appears good.  Amlodipine  10mg  daily currently regimen.  Blood pressure control is suboptimal due to stress and potentially need for additional medication. - Re-assess control next visit  Written patient instructions provided. Patient verbalized understanding of treatment plan.  Total time in face to face counseling 52 minutes.    Follow-up:  Pharmacist 2 weeks 12/28/2023 PCP clinic visit 01/11/2024.  Patient seen with Lawson Mao, PharmD Candidate - PY3 student.

## 2023-12-17 NOTE — Progress Notes (Signed)
 Reviewed and agree with Dr Rennis plan.

## 2023-12-28 ENCOUNTER — Encounter: Payer: Self-pay | Admitting: Pharmacist

## 2023-12-28 ENCOUNTER — Ambulatory Visit (INDEPENDENT_AMBULATORY_CARE_PROVIDER_SITE_OTHER): Payer: Self-pay | Admitting: Pharmacist

## 2023-12-28 VITALS — BP 122/71 | HR 70 | Wt 272.6 lb

## 2023-12-28 DIAGNOSIS — E1159 Type 2 diabetes mellitus with other circulatory complications: Secondary | ICD-10-CM

## 2023-12-28 MED ORDER — BASAGLAR KWIKPEN 100 UNIT/ML ~~LOC~~ SOPN: 15.0000 [IU] | PEN_INJECTOR | SUBCUTANEOUS | Status: DC

## 2023-12-28 NOTE — Assessment & Plan Note (Signed)
 Diabetes diagnosed in 2013, with history of transmetatarsal amputation of left foot.  Currently has sub-optimal glucose but markedly improved control with average glucose of 226 on CGM. Patient is able to verbalize appropriate hypoglycemia management plan. Medication adherence appears good. Control is suboptimal due to inadequate medication regimen including glipizide  and metformin  and initial dose of basal insulin . - INCREASED Basaglar  (insulin  glargine) from 12 to 15 units starting tomorrow AM and in 1 week, increase to 18 units. Provided additional sample pen.  Has supply for 3 weeks. - Continue glipizide  daily at this time - until glucose control improves with insulin  use, then plan to D/C - patient instructed to NOT refill.  -Continued metformin  500mg  BID  -Patient educated on purpose, proper use, and potential adverse effects.  -Extensively discussed pathophysiology of diabetes, recommended lifestyle interventions, dietary effects on blood sugar control.  -Counseled on s/sx of and management of hypoglycemia.  -Next A1c anticipated 2 months at [facility] study visit.

## 2023-12-28 NOTE — Progress Notes (Signed)
 Reviewed and agree with Dr Rennis plan.

## 2023-12-28 NOTE — Patient Instructions (Addendum)
 It was nice to see you today!  You are doing much better.   Your goal blood sugar is 80-130 before eating and less than 180 after eating.  Medication Changes Increase Basaglar  (insulin  glargine) to 15 units each morning starting tomorrow.  In 1 week, 10/24 increase your dose to 18 units each morning.  Contact our office if you have multiple readings of less than 70  We will like NOT continue glipizide  at next visit - please do not refill.  Continue all other medication the same.   Keep up the good work with diet and exercise. Aim for a diet full of vegetables, fruit and lean meats (chicken, [place], fish). Try to limit salt intake by eating fresh or frozen vegetables (instead of canned), rinse canned vegetables prior to cooking and do not add any additional salt to meals.   Medicaid OFFICE - [facility]  [address]

## 2023-12-28 NOTE — Progress Notes (Signed)
 S:     Chief Complaint  Patient presents with   Medication Management    Diabetes F/U   58 y.o. male who presents for diabetes evaluation, education, and management in follow-up after enrollment in the LIBERATE Study.  Today, patient arrives in good spirits and presents without any assistance.  Patient was referred and last seen by Primary Care Provider, Dr. Larraine, on 12/06/2023.  Patient continues to report lack of insurance and lack of access to medications due to cost, states he has not heard from [place] yet.    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    Per patient chart, Diabetes was diagnosed 2013.    Current diabetes medications include: Glipizide  5mg  Immediate-release daily, metformin  500mg  BID, Basaglar  (insulin  glargine) 12 units once daily Current hypertension medications include: amlodipine  10mg  daily Current hyperlipidemia medications include: atorvastatin  40mg  daily   Patient reports adherence to taking all medications as prescribed.    Do you feel that your medications are working for you? yes Have you been experiencing any side effects to the medications prescribed? no Do you have any problems obtaining medications due to transportation or finances? yes Insurance coverage: none - Medicaid pending   Patient denies hypoglycemic events.   Patient reports self foot exams. Patient reports decreased nocturia (nighttime urination).    O:   Review of Systems  All other systems reviewed and are negative.   Physical Exam Vitals reviewed.  Constitutional:      Appearance: Normal appearance.  Pulmonary:     Effort: Pulmonary effort is normal.  Neurological:     Mental Status: He is alert.  Psychiatric:        Mood and Affect: Mood normal.        Behavior: Behavior normal.        Thought Content: Thought content normal.        Judgment: Judgment normal.    Libre3 CGM Download today  12/28/2023 % Time CGM is active: 96% Average Glucose: 226 mg/dL Glucose Management Indicator: 8.7  Glucose Variability: 23.4% (goal <36%) Time in Goal:  - Time in range 70-180: 20% - Time above range: 80%  (29%> 250) - Time below range: 0% Observed patterns: prandial spikes, minimal variability  Lab Results  Component Value Date   HGBA1C 9.6 (A) 12/14/2023   Vitals:   12/28/23 0848  BP: 122/71  Pulse: 70  SpO2: 99%    Lipid Panel     Component Value Date/Time   CHOL 184 12/06/2023 1509   TRIG 222 (H) 12/06/2023 1509   HDL 29 (L) 12/06/2023 1509   CHOLHDL 6.3 (H) 12/06/2023 1509   CHOLHDL 5.0 09/21/2020 1616   VLDL 17 09/21/2020 1616   LDLCALC 116 (H) 12/06/2023 1509    Clinical Atherosclerotic Cardiovascular Disease (ASCVD): No  The 10-year ASCVD risk score (Arnett DK, et al., 2019) is: 21.5%   Values used to calculate the score:     Age: 6 years     Clincally relevant sex: Male     Is Non-Hispanic African American: No     Diabetic: Yes     Tobacco smoker: No     Systolic Blood Pressure: 122 mmHg     Is BP treated: Yes     HDL Cholesterol: 29 mg/dL     Total Cholesterol: 184 mg/dL    A/P: Diabetes diagnosed in 2013, with history of transmetatarsal amputation of left foot.  Currently has sub-optimal glucose but  markedly improved control with average glucose of 226 on CGM. Patient is able to verbalize appropriate hypoglycemia management plan. Medication adherence appears good. Control is suboptimal due to inadequate medication regimen including glipizide  and metformin  and initial dose of basal insulin . - INCREASED Basaglar  (insulin  glargine) from 12 to 15 units starting tomorrow AM and in 1 week, increase to 18 units. Provided additional sample pen.  Has supply for 3 weeks. - Continue glipizide  daily at this time - until glucose control improves with insulin  use, then plan to D/C - patient instructed to NOT refill.  -Continued metformin  500mg  BID  -Patient educated  on purpose, proper use, and potential adverse effects.  -Extensively discussed pathophysiology of diabetes, recommended lifestyle interventions, dietary effects on blood sugar control.  -Counseled on s/sx of and management of hypoglycemia.  -Next A1c anticipated 2 months at [facility] study visit.    ASCVD risk - primary prevention in patient with diabetes. Last LDL is 116 on 12/06/2023 was not at goal of <70 mg/dL and Atorvastatin  40mg  is being tolerated.  - Continued atorvastatin  40 mg - Consider repeat direct LDL at next blood draw   Hypertension longstanding and currently at blood pressure goal, today 122/71. Blood pressure goal of <130/80 mmHg. Medication adherence appears good.   - Continue Amlodipine  10mg  daily  Written patient instructions provided. Patient verbalized understanding of treatment plan.  Total time in face to face counseling 35 minutes.    Follow-up:  Pharmacist 11/7 PCP clinic visit in 10/31

## 2024-01-10 NOTE — Progress Notes (Unsigned)
    SUBJECTIVE:   CHIEF COMPLAINT / HPI:   Carl [name] is a 58 y.o. male presenting for diabetes follow-up.  Diabetes Basaglar  previously increased to 18 units by Dr. Koval in mid October.  Plan to continue glipizide  until glucose control improves with insulin  then discontinue.  Also on metformin .  A1c upcoming at follow-up with Dr. Amalia 01/18/2024  Anemia Last hemoglobin 12/06/23 of 12.8.  No prior colonoscopy.  Healthcare maintenance: - Flu vaccine - Tdap booster - Pneumococcal vaccine - Hep B vaccines - Shingrix vaccine - COVID-vaccine - Colonoscopy   PERTINENT  PMH / PSH: T2DM, HTN, HLD, anemia  OBJECTIVE:   There were no vitals taken for this visit.  ***  ASSESSMENT/PLAN:   Assessment & Plan Type 2 diabetes mellitus with other circulatory complication, unspecified whether long term insulin  use (HCC)  Anemia, unspecified type  - Labs: Iron panel    Alan Flies, MD [facility]

## 2024-01-10 NOTE — Assessment & Plan Note (Signed)
-   Labs: Iron panel

## 2024-01-11 ENCOUNTER — Ambulatory Visit: Payer: Self-pay

## 2024-01-11 VITALS — BP 139/82 | HR 80 | Ht 70.0 in | Wt 275.8 lb

## 2024-01-11 DIAGNOSIS — E1159 Type 2 diabetes mellitus with other circulatory complications: Secondary | ICD-10-CM

## 2024-01-11 DIAGNOSIS — I10 Essential (primary) hypertension: Secondary | ICD-10-CM

## 2024-01-11 DIAGNOSIS — D649 Anemia, unspecified: Secondary | ICD-10-CM

## 2024-01-11 MED ORDER — LOSARTAN POTASSIUM 50 MG PO TABS: 50.0000 mg | ORAL_TABLET | Freq: Every day | ORAL | 3 refills | Status: DC

## 2024-01-11 NOTE — Patient Instructions (Signed)
 Carl [name],   It was great seeing you in clinic today! You came in for follow-up of your anemia (aka low blood count).  I am sorry to hear that you have not been able to get insurance.  Because your low blood count is not dangerously low, we do not urgently need to do anything right now.   - When you are able to get insurance, please let me know, and I will refer you for a colonoscopy.   - I am also putting in a future lab order, which you can come and have drawn once you have insurance.  Your blood pressure was a bit high today in clinic, and so I am starting you on a medication called losartan .  You will take 1 pill (50 mg) once a day at bedtime.  I also reviewed your morning blood sugars, which were still a bit high.  As such, I am increasing your insulin  to 20 units daily.  Please follow-up with Dr. Koval on 11/7 as scheduled.  Thank you for allowing me to be a part of your care team! Alan Flies, MD [facility] [address] [place], [place], [place] [address] [phone]

## 2024-01-11 NOTE — Assessment & Plan Note (Signed)
 Blood pressure remains elevated on recheck today.  Following discussion with patient, decision was made to start additional blood pressure medication. - Start losartan  50 mg daily at bedtime (should be affordable for patient, but told to call if not) - Continue amlodipine  10 mg daily

## 2024-01-11 NOTE — Assessment & Plan Note (Signed)
 Reviewed patient's glucose monitoring readings, and morning fasting readings persistently elevated above 150.  As such, will increase insulin . - Increase Basaglar  to 20 units daily - Continue metformin  500 mg twice daily, glipizide  5 mg daily (plan to discontinue glipizide  once sugars controlled on insulin  and metformin ) - Follow-up as scheduled with Dr. Koval on 01/18/2024

## 2024-01-18 ENCOUNTER — Encounter: Payer: Self-pay | Admitting: Pharmacist

## 2024-01-18 ENCOUNTER — Ambulatory Visit (INDEPENDENT_AMBULATORY_CARE_PROVIDER_SITE_OTHER): Payer: Self-pay | Admitting: Pharmacist

## 2024-01-18 ENCOUNTER — Other Ambulatory Visit: Payer: Self-pay

## 2024-01-18 VITALS — BP 152/79 | HR 76 | Wt 276.8 lb

## 2024-01-18 DIAGNOSIS — E1159 Type 2 diabetes mellitus with other circulatory complications: Secondary | ICD-10-CM

## 2024-01-18 DIAGNOSIS — I10 Essential (primary) hypertension: Secondary | ICD-10-CM

## 2024-01-18 DIAGNOSIS — E785 Hyperlipidemia, unspecified: Secondary | ICD-10-CM

## 2024-01-18 MED ORDER — INSULIN PEN NEEDLE 32G X 4 MM MISC
1.0000 | Freq: Every day | 11 refills | Status: AC | PRN
Start: 2024-01-18 — End: ?
  Filled 2024-01-18: qty 100, 100d supply, fill #0

## 2024-01-18 MED ORDER — BASAGLAR KWIKPEN 100 UNIT/ML ~~LOC~~ SOPN
20.0000 [IU] | PEN_INJECTOR | Freq: Every day | SUBCUTANEOUS | 1 refills | Status: DC
  Filled 2024-01-18: qty 6, 30d supply, fill #0
  Filled 2024-02-12: qty 6, 30d supply, fill #1

## 2024-01-18 MED ORDER — LOSARTAN POTASSIUM 100 MG PO TABS: 100.0000 mg | ORAL_TABLET | Freq: Every day | ORAL | 3 refills | Status: DC

## 2024-01-18 MED ORDER — OZEMPIC (0.25 OR 0.5 MG/DOSE) 2 MG/3ML ~~LOC~~ SOPN: 0.2500 mg | PEN_INJECTOR | SUBCUTANEOUS | Status: DC

## 2024-01-18 NOTE — Patient Instructions (Addendum)
 It was nice to see you today!  Your goal blood sugar is 80-130 before eating and less than 180 after eating.  Medication Changes: START Ozempic (semaglutide) 0.25 mg once weekly   Increase losartan  to 100 mg once daily  Continue all other medication the same.   Keep up the good work with diet and exercise. Aim for a diet full of vegetables, fruit and lean meats (chicken, turkey, fish). Try to limit salt intake by eating fresh or frozen vegetables (instead of canned), rinse canned vegetables prior to cooking and do not add any additional salt to meals.

## 2024-01-18 NOTE — Assessment & Plan Note (Signed)
 Hypertension longstanding and currently uncontrolled. Today in-office Blood Pressure is 163/84 then 152/79 at second check. Blood pressure goal of <130/80 mmHg. Medication adherence appears good. Blood pressure control is suboptimal due to suboptimal regimen. -Increased dose of losartan  to 100 mg once daily. -Continue amlodipine  10 mg once daily

## 2024-01-18 NOTE — Progress Notes (Signed)
 S:     Chief Complaint  Patient presents with   Medication Management    Diabetes Management   58 y.o. male who presents for diabetes evaluation, education, and management. Patient arrives in  good spirits and presents without any assistance.   Patient was referred and last seen by Primary Care Provider, Dr. Larraine, on 01/11/24.  At last visit with PCP on 01/11/24, Basaglar  (insulin  glargine) was increased to 20 units daily and started on losartan  50 mg daily at bedtime.   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   Current diabetes medications include: insulin  glargine 20U daily, metformin  500 mg BID, glipizide  5 mg daily Current hypertension medications include: amlodipine  10 mg daily, losartan  50 mg daily Current hyperlipidemia medications include: atorvastatin  40 mg daily  Patient reports adherence to taking all medications as prescribed. Reported taking Ozempic in the past but his insurance didn't continue to cover. Patient said the Ozempic helped with blood sugar and some nausea but tolerable. Reported that he is not qualified for Medicaid and needs patient assistance with insulin  glargine. He only has enough stock for two more doses left.   Patient denies hypoglycemic events.  Patient reports nocturia (nighttime urination): Usually q2-3h currently. Patient denies neuropathy (nerve pain). Patient denies visual changes. Patient reports self foot exams: Reported current wound is healing slowly.   Patient reported dietary habits: Eats 3 meals/day. For dinner, reported he doesn't eat until 7PM and go to sleep around 8:30-9 PM. Wake up at 5:30AM Breakfast: cheese, biscuit once a while Lunch: depends but usually salad with dressings and soup Dinner: shrimp and vegetables Snacks: carrot sticks, cheese Drinks: unsweetened tea  Patient-reported exercise habits: job requires lots of walking  O:   Review of Systems   All other systems reviewed and are negative.   Physical Exam Constitutional:      Appearance: Normal appearance.  Pulmonary:     Effort: Pulmonary effort is normal.  Neurological:     Mental Status: He is alert.  Psychiatric:        Mood and Affect: Mood normal.        Behavior: Behavior normal.        Thought Content: Thought content normal.        Judgment: Judgment normal.   Libre3 CGM Download today 01/05/24-01/18/24 % Time CGM is active: 96% Average Glucose: 231 mg/dL Glucose Management Indicator: 8.8%  Glucose Variability: 21.3% (goal <36%) Time in Goal:  - Time in range 70-180: 14% - Time above range: 86% - Time below range: 0%  Lab Results  Component Value Date   HGBA1C 9.6 (A) 12/14/2023   Vitals:   01/18/24 0833 01/18/24 0835  BP: (!) 163/84 (!) 152/79  Pulse: 76 76  SpO2: 98% 98%   Lipid Panel     Component Value Date/Time   CHOL 184 12/06/2023 1509   TRIG 222 (H) 12/06/2023 1509   HDL 29 (L) 12/06/2023 1509   CHOLHDL 6.3 (H) 12/06/2023 1509   CHOLHDL 5.0 09/21/2020 1616   VLDL 17 09/21/2020 1616   LDLCALC 116 (H) 12/06/2023 1509    Clinical Atherosclerotic Cardiovascular Disease (ASCVD): No The 10-year ASCVD risk score (Arnett DK, et al., 2019) is: 30.1%   Values used to calculate the score:     Age: 27 years     Clincally relevant sex: Male     Is Non-Hispanic African American: No     Diabetic: Yes  Tobacco smoker: No     Systolic Blood Pressure: 152 mmHg     Is BP treated: Yes     HDL Cholesterol: 29 mg/dL     Total Cholesterol: 184 mg/dL   A/P: Diabetes diagnosed in 2013, with history of transmetatarsal amputation of left foot. Currently has sub-optimal glucose with average glucose of 231 on CGM. GMI increased from 8.7 (12/28/2023) to 8.8 today. Medication adherence appears good. Control is suboptimal due to suboptimal regimen.  -Continued basal insulin  20 Basaglar  (insulin  glargine) once daily. Sent prescription to [facility] and will fill through [employer] while waiting for [employer], CPhT, working on MAP.  -Started GLP-1 0.25 mg Ozempic (semaglutide) once weekly. Patient was given sample today at the clinic. Supply could last for 8 weeks if there is no dosing escalation.  -Continued glipizide  5 mg once daily -Continued metformin  500 mg BID -Patient educated on purpose, proper use, and potential adverse effects of Ozempic.  -Recommended lifestyle interventions, dietary effects on blood sugar control.    ASCVD risk - primary secondary prevention in patient with diabetes. Last LDL is 116 on 12/06/2023 not at goal of <70  mg/dL. ASCVD risk factors include hypertension, Hyperlipidemia, T2DM and 10-year ASCVD risk score of 30.1%. high intensity statin indicated.  -Continued atorvastatin  40 mg.   Hypertension longstanding and currently uncontrolled. Today in-office Blood Pressure is 163/84 then 152/79 at second check. Blood pressure goal of <130/80 mmHg. Medication adherence appears good. Blood pressure control is suboptimal due to suboptimal regimen. -Increased dose of losartan  to 100 mg once daily. -Continue amlodipine  10 mg once daily  Written patient instructions provided. Patient verbalized understanding of treatment plan.  Total time in face to face counseling 35 minutes.    Follow-up:  Pharmacist 02/22/2024 Patient seen with Lawson Mao, PharmD Candidate - PY3 student and [employer] PharmD - PY4 Candidate.

## 2024-01-18 NOTE — Assessment & Plan Note (Signed)
 Diabetes diagnosed in 2013, with history of transmetatarsal amputation of left foot. Currently has sub-optimal glucose with average glucose of 231 on CGM. GMI increased from 8.7 (12/28/2023) to 8.8 today. Medication adherence appears good. Control is suboptimal due to suboptimal regimen.  -Continued basal insulin  20 Basaglar  (insulin  glargine) once daily. Sent prescription to [facility] and will fill through [employer] while waiting for [place] working on MAP.  -Started GLP-1 0.25 mg Ozempic (semaglutide) once weekly. Patient was given sample today at the clinic. Supply could last for 8 weeks if there is no dosing escalation.  -Continued glipizide  5 mg once daily -Continued metformin  500 mg BID -Patient educated on purpose, proper use, and potential adverse effects of Ozempic.  -Recommended lifestyle interventions, dietary effects on blood sugar control.

## 2024-01-18 NOTE — Progress Notes (Signed)
 Reviewed and agree with Dr Rennis plan.

## 2024-01-18 NOTE — Assessment & Plan Note (Signed)
  ASCVD risk - primary secondary prevention in patient with diabetes. Last LDL is 116 on 12/06/2023 not at goal of <70  mg/dL. ASCVD risk factors include hypertension, Hyperlipidemia, T2DM and 10-year ASCVD risk score of 30.1%. high intensity statin indicated.  -Continued atorvastatin  40 mg.

## 2024-01-22 ENCOUNTER — Other Ambulatory Visit: Payer: Self-pay

## 2024-02-12 ENCOUNTER — Other Ambulatory Visit: Payer: Self-pay

## 2024-02-22 ENCOUNTER — Encounter: Payer: Self-pay | Admitting: Pharmacist

## 2024-02-22 ENCOUNTER — Ambulatory Visit (INDEPENDENT_AMBULATORY_CARE_PROVIDER_SITE_OTHER): Payer: Self-pay | Admitting: Pharmacist

## 2024-02-22 VITALS — BP 173/104 | HR 73 | Wt 272.0 lb

## 2024-02-22 DIAGNOSIS — I10 Essential (primary) hypertension: Secondary | ICD-10-CM

## 2024-02-22 DIAGNOSIS — E1159 Type 2 diabetes mellitus with other circulatory complications: Secondary | ICD-10-CM

## 2024-02-22 MED ORDER — OZEMPIC (0.25 OR 0.5 MG/DOSE) 2 MG/3ML ~~LOC~~ SOPN: 0.5000 mg | PEN_INJECTOR | SUBCUTANEOUS | Status: DC

## 2024-02-22 MED ORDER — HYDROCHLOROTHIAZIDE 12.5 MG PO TABS: 12.5000 mg | ORAL_TABLET | Freq: Every day | ORAL | 1 refills | Status: DC

## 2024-02-22 NOTE — Assessment & Plan Note (Signed)
 Hypertension longstanding and currently uncontrolled. Today, in-office Blood Pressure is remains elevated despite dose increase of losartan  at last visit. Blood pressure goal of <130/80 mmHg. Medication adherence appears good. Blood pressure control is suboptimal due to suboptimal regimen. - Continued losartan  100 mg once daily - Continue amlodipine  10 mg once daily - STARTED hydrochlorothiazide 12.5 mg daily

## 2024-02-22 NOTE — Patient Instructions (Signed)
 It was nice to see you today!  Your glucose values are greatly improved.    Your goal blood sugar is 80-130 before eating and less than 180 after eating.  Continue to work on losing 1 pound per week.   Medication Changes: START hydrochlorothiazide 12.5mg  once daily   Increase Ozempic  (semaglutide ) 0.5mg  weekly on Friday.   Continue all other medication the same.    Keep up the good work with diet and exercise. Aim for a diet full of vegetables, fruit and lean meats (chicken, turkey, fish). Try to limit salt intake by eating fresh or frozen vegetables (instead of canned), rinse canned vegetables prior to cooking and do not add any additional salt to meals.

## 2024-02-22 NOTE — Progress Notes (Signed)
 S:     Chief Complaint  Patient presents with   Medication Management    Diabetes   58 y.o. male who presents for diabetes evaluation, education, and management. Patient arrives in good spirits and presents without any assistance. He ambulates with some difficulty likely leg/foot pain post transmetatarsal amputation.  Patient was referred and last seen by Primary Care Provider, Dr. Larraine, on 01/11/2024.  At last visit, patient was doing fairly well with glucose control.  Blood pressure medication was adjusted losartan  increased from 50 to 100mg  daily.   PMH is significant for foot infection which is reported to be healing well.   Current diabetes medications include: insulin  glargine 20 units once daily, metformin  XR 500 mg BID, glipizide  5 mg daily (completed supply of medication ~ 3 days prior and stopped as instructed at last visit).  Current hypertension medications include: amlodipine  10 mg daily, losartan  100 mg daily Current hyperlipidemia medications include: atorvastatin  40 mg daily  Patient reports adherence to taking all medications as prescribed.   Do you feel that your medications are working for you? yes Have you been experiencing any side effects to the medications prescribed? no Do you have any problems obtaining medications due to transportation or finances? Yes  Insurance coverage: remains uninsured.  Patient reports hypoglycemic events.  1X and managed appropriately.   Patient reports self foot exams.   Patient-reported exercise habits: limited but active with family events.  Planning to go to holiday parade with grandchildren tomorrow.  Enjoys using CGM sensors as part of Liberate study - used final sensor yesterday.   Requests additional to get him to his 3 month liberate study visit in January.   O:   Review of Systems  Musculoskeletal:  Positive for joint pain.  All other systems reviewed and are negative.   Physical Exam Vitals reviewed.   Constitutional:      Appearance: Normal appearance.  Pulmonary:     Effort: Pulmonary effort is normal.  Musculoskeletal:     Right lower leg: Edema (1+) present.     Left lower leg: Edema (2+) present.  Neurological:     Mental Status: He is alert.  Psychiatric:        Mood and Affect: Mood normal.        Behavior: Behavior normal.        Thought Content: Thought content normal.        Judgment: Judgment normal.     Libre3 CGM Download today - last readings yesterday 02/21/2024 % Time CGM is active: 100% Average Glucose: 207 mg/dL Glucose Management Indicator: 8.3  Glucose Variability: 26% (goal <36%) Time in Goal:  - Time in range 70-180: 32% - Time above range: 68% (48 % were 180-250) - Time below range: 0% Observed patterns: mealtime elevation with gradually lower peaks over time.  Patient is actively decreasing carb intake.   Lab Results  Component Value Date   HGBA1C 9.6 (A) 12/14/2023   Vitals:   02/22/24 0834 02/22/24 0843  BP: (!) 177/91 (!) 173/104  Pulse: 73   SpO2: 98%     Lipid Panel     Component Value Date/Time   CHOL 184 12/06/2023 1509   TRIG 222 (H) 12/06/2023 1509   HDL 29 (L) 12/06/2023 1509   CHOLHDL 6.3 (H) 12/06/2023 1509   CHOLHDL 5.0 09/21/2020 1616   VLDL 17 09/21/2020 1616   LDLCALC 116 (H) 12/06/2023 1509    Clinical Atherosclerotic Cardiovascular Disease (ASCVD): No  The 10-year  ASCVD risk score (Arnett DK, et al., 2019) is: 36.4%   Values used to calculate the score:     Age: 64 years     Clinically relevant sex: Male     Is Non-Hispanic African American: No     Diabetic: Yes     Tobacco smoker: No     Systolic Blood Pressure: 173 mmHg     Is BP treated: Yes     HDL Cholesterol: 29 mg/dL     Total Cholesterol: 184 mg/dL   A/P: Diabetes longstanding diagnosed in 2013, with history of transmetatarsal amputation of left foot. Currently has sub-optimal but improved glucose with average glucose of 207 on CGM. GMI decreased to  8.3 Medication adherence appears good. Control is suboptimal due to suboptimal regimen.  Weight loss of ~ 4 lbs in 5 weeks.  - Continued basal insulin  20 Basaglar  (insulin  glargine) once daily. S - Increased  GLP-1 Ozempic  (semaglutide )from 0.25 to 0.5mg  once weekly. Patient was given sample today at the clinic. Supply could last for 4 weeks.  - STOPPED Glipizide   -Continued metformin  XR 500 mg BID -Recommended lifestyle interventions, dietary effects on blood sugar control.   - 2 additional Libre 3+ sensors provided    ASCVD risk - primary secondary prevention in patient with diabetes. High intensity statin indicated.  -Continued atorvastatin  40 mg   Hypertension longstanding and currently uncontrolled. Today, in-office Blood Pressure is remains elevated despite dose increase of losartan  at last visit. Blood pressure goal of <130/80 mmHg. Medication adherence appears good. Blood pressure control is suboptimal due to suboptimal regimen. - Continued losartan  100 mg once daily - Continue amlodipine  10 mg once daily - STARTED hydrochlorothiazide 12.5 mg daily  Written patient instructions provided. Patient verbalized understanding of treatment plan.  Total time in face to face counseling 31 minutes.    Follow-up:  Pharmacist 03/21/2024 PCP clinic visit anticipated 04/2024

## 2024-02-22 NOTE — Assessment & Plan Note (Signed)
 Diabetes longstanding diagnosed in 2013, with history of transmetatarsal amputation of left foot. Currently has sub-optimal but improved glucose with average glucose of 207 on CGM. GMI decreased to 8.3 Medication adherence appears good. Control is suboptimal due to suboptimal regimen.  Weight loss of ~ 4 lbs in 5 weeks.  - Continued basal insulin  20 Basaglar  (insulin  glargine) once daily. S - Increased  GLP-1 Ozempic  (semaglutide )from 0.25 to 0.5mg  once weekly. Patient was given sample today at the clinic. Supply could last for 4 weeks.  - STOPPED Glipizide   -Continued metformin  XR 500 mg BID -Recommended lifestyle interventions, dietary effects on blood sugar control.   - 2 additional Libre 3+ sensors provided

## 2024-02-22 NOTE — Progress Notes (Signed)
 Reviewed and agree with Dr Rennis plan.

## 2024-02-25 NOTE — Progress Notes (Signed)
 Reviewed and agree with Dr Rennis plan.

## 2024-03-11 ENCOUNTER — Other Ambulatory Visit: Payer: Self-pay | Admitting: Family Medicine

## 2024-03-11 ENCOUNTER — Other Ambulatory Visit: Payer: Self-pay

## 2024-03-11 DIAGNOSIS — E1159 Type 2 diabetes mellitus with other circulatory complications: Secondary | ICD-10-CM

## 2024-03-11 MED ORDER — BASAGLAR KWIKPEN 100 UNIT/ML ~~LOC~~ SOPN
20.0000 [IU] | PEN_INJECTOR | Freq: Every day | SUBCUTANEOUS | 1 refills | Status: AC
  Filled 2024-03-11: qty 6, 30d supply, fill #0
  Filled 2024-04-14: qty 6, 30d supply, fill #1

## 2024-03-12 ENCOUNTER — Other Ambulatory Visit: Payer: Self-pay

## 2024-03-18 ENCOUNTER — Telehealth: Payer: Self-pay | Admitting: Pharmacist

## 2024-03-18 NOTE — Telephone Encounter (Signed)
 Attempted to contact patient for request to reschedule due to conflict in schedule of provider.    Review of CGM shows no use since 12/26 Evaluate reason for lack of use at contact.   Left HIPAA compliant voice mail requesting call back to  My direct phone: [phone] Or Main number to reschedule: [phone]  Total time with patient call and documentation of interaction: 12 minutes.

## 2024-03-19 NOTE — Telephone Encounter (Signed)
 Reviewed and agree with Dr Rennis plan.

## 2024-03-21 ENCOUNTER — Ambulatory Visit: Payer: Self-pay | Admitting: Pharmacist

## 2024-03-21 ENCOUNTER — Other Ambulatory Visit: Payer: Self-pay

## 2024-04-04 ENCOUNTER — Encounter: Payer: Self-pay | Admitting: Pharmacist

## 2024-04-04 ENCOUNTER — Ambulatory Visit: Payer: Self-pay | Admitting: Pharmacist

## 2024-04-04 VITALS — BP 131/73 | HR 76 | Wt 276.0 lb

## 2024-04-04 DIAGNOSIS — L97522 Non-pressure chronic ulcer of other part of left foot with fat layer exposed: Secondary | ICD-10-CM

## 2024-04-04 DIAGNOSIS — I10 Essential (primary) hypertension: Secondary | ICD-10-CM

## 2024-04-04 DIAGNOSIS — E1159 Type 2 diabetes mellitus with other circulatory complications: Secondary | ICD-10-CM

## 2024-04-04 LAB — POCT GLYCOSYLATED HEMOGLOBIN (HGB A1C): HbA1c, POC (controlled diabetic range): 8.5 % — AB (ref 0.0–7.0)

## 2024-04-04 MED ORDER — OLMESARTAN-AMLODIPINE-HCTZ 40-10-25 MG PO TABS: 1.0000 | ORAL_TABLET | Freq: Every day | ORAL | 11 refills | Status: AC

## 2024-04-04 MED ORDER — EMPAGLIFLOZIN 25 MG PO TABS: 25.0000 mg | ORAL_TABLET | Freq: Every day | ORAL | Status: AC

## 2024-04-04 MED ORDER — OZEMPIC (0.25 OR 0.5 MG/DOSE) 2 MG/3ML ~~LOC~~ SOPN: 0.5000 mg | PEN_INJECTOR | SUBCUTANEOUS | Status: AC

## 2024-04-04 NOTE — Assessment & Plan Note (Signed)
 Diabetes longstanding with improved control over the last three months.  Currently A1C of 8.5. Medication adherence appears okay.  Insurance and medication access remain challenges to Improved control.  - Increased basal insulin  Basaglar  (insulin  glargine)  20 units to 24 units units daily in the morning.  - Continued Ozempic  (semaglutide ) 0.5 mg once weekly.  - Started SGLT2-I Jardiance (empagliflozin) 25 mg (sample provided) once daily.  - Continued metformin  500 mg BID.  - Patient educated on purpose, proper use, and potential adverse effects.  - Extensively discussed pathophysiology of diabetes, recommended lifestyle interventions, dietary effects on glucose control.  - Counseled on s/sx of and management of hypoglycemia.

## 2024-04-04 NOTE — Progress Notes (Signed)
 "   S:     No chief complaint on file.  59 y.o. male who presents for diabetes evaluation, education, and management. Patient arrives in good spirits and presents without any assistance.  Patient was referred and last seen by Primary Care Provider, Dr. Amalia, on 02/22/24.  At last visit, started hydrochlorothiazide  12.5 mg once daily, increased Ozempic  (semaglutide ) 0.5 mg weekly.   PMH is significant for hypertension, Diabetes, foot infection.    Current diabetes medications include: Metformin  500 mg BID, insulin  glargine 20 units once daily, Ozempic  (semaglutide ) 0.5 mg weekly Current hypertension medications include: amlodipine  10 mg daily, hydrochlorothiazide  12.5 mg daily, losartan  100 mg daily Current hyperlipidemia medications include: Atorvastatin  40 mg daily,   Patient reports adherence to taking all medications as prescribed.   Do you feel that your medications are working for you? yes Have you been experiencing any side effects to the medications prescribed? no  Insurance cover: N/A Asked patient to visit Medicaid office for insurance  Patient denies hypoglycemic events.   O:   Review of Systems  Cardiovascular:  Positive for leg swelling.  All other systems reviewed and are negative.   Physical Exam Vitals reviewed.  Constitutional:      Appearance: Normal appearance.  Pulmonary:     Effort: Pulmonary effort is normal.  Musculoskeletal:     Right lower leg: Edema present.     Left lower leg: Edema present.  Skin:    Findings: Lesion (left foot) present.  Neurological:     Mental Status: He is alert.  Psychiatric:        Mood and Affect: Mood normal.        Behavior: Behavior normal.        Thought Content: Thought content normal.        Judgment: Judgment normal.     7 day average glucose: 190 mg/dL  Libre3 CGM Download from 03/07/2024  (no recent use) % Time CGM is active: 76% Average Glucose: 190 mg/dL Glucose Management Indicator: 7.9   Glucose Variability: 25.5% (goal <36%) Time in Goal:  - Time in range 70-180: 42% - Time above range: 58% - Time below range: 0% Observed patterns:  Lab Results  Component Value Date   HGBA1C 8.5 (A) 04/04/2024   Vitals:   04/04/24 0911  BP: 131/73  Pulse: 76  SpO2: 98%    Lipid Panel     Component Value Date/Time   CHOL 184 12/06/2023 1509   TRIG 222 (H) 12/06/2023 1509   HDL 29 (L) 12/06/2023 1509   CHOLHDL 6.3 (H) 12/06/2023 1509   CHOLHDL 5.0 09/21/2020 1616   VLDL 17 09/21/2020 1616   LDLCALC 116 (H) 12/06/2023 1509    Clinical Atherosclerotic Cardiovascular Disease (ASCVD): No  The 10-year ASCVD risk score (Arnett DK, et al., 2019) is: 25.6%   Values used to calculate the score:     Age: 16 years     Clinically relevant sex: Male     Is Non-Hispanic African American: No     Diabetic: Yes     Tobacco smoker: No     Systolic Blood Pressure: 131 mmHg     Is BP treated: Yes     HDL Cholesterol: 29 mg/dL     Total Cholesterol: 184 mg/dL  Lab Results  Component Value Date   CHOL 184 12/06/2023   HDL 29 (L) 12/06/2023   LDLCALC 116 (H) 12/06/2023   TRIG 222 (H) 12/06/2023   CHOLHDL 6.3 (H) 12/06/2023  Lab Results  Component Value Date   CREATININE 0.83 12/06/2023   BUN 16 12/06/2023   NA 139 12/06/2023   K 4.6 12/06/2023   CL 101 12/06/2023   CO2 20 12/06/2023    Medications Reviewed Today     Reviewed by Nakhi Choi G, RPH-CPP (Pharmacist) on 04/04/24 at 0908  Med List Status: <None>   Medication Order Taking? Sig Documenting Provider Last Dose Status Informant  acetaminophen  (TYLENOL ) 325 MG tablet [id number] Yes Take 2 tablets (650 mg total) by mouth every 6 (six) hours as needed for mild pain or fever. Macario Dorothyann HERO, MD  Active Self  amLODipine  (NORVASC ) 10 MG tablet [id number] Yes Take 1 tablet (10 mg total) by mouth daily. Larraine Palma, MD  Active   aspirin  EC 81 MG tablet [id number] Yes Take 1 tablet (81 mg total) by mouth daily.  Swallow whole. Marlee Lynwood NOVAK, MD  Active Self  atorvastatin  (LIPITOR) 40 MG tablet [id number] Yes Take 1 tablet (40 mg total) by mouth daily. Larraine Palma, MD  Active   blood glucose meter kit and supplies KIT [id number] Yes Dispense based on patient and insurance preference. Use up to four times daily as directed. (FOR ICD-9 250.00, 250.01). Patsey Lot, MD  Active Self  Capsicum, Cayenne, (CAYENNE PEPPER PO) [id number]  Take 1 tablet by mouth daily.  Patient not taking: Reported on 04/04/2024   [provider]  Active   GARLIC PO [id number]  Take 2 tablets by mouth daily.  Patient not taking: Reported on 04/04/2024   [provider]  Active   hydrochlorothiazide  (HYDRODIURIL ) 12.5 MG tablet [id number] Yes Take 1 tablet (12.5 mg total) by mouth daily. McDiarmid, Krystal BIRCH, MD  Active   Insulin  Glargine (BASAGLAR  [facility]) 100 UNIT/ML [id number] Yes Inject 20 Units into the skin daily. Larraine Palma, MD  Active   Insulin  Pen Needle 32G X 4 MM MISC [id number]  Use with insulin . McDiarmid, Todd D, MD  Active   losartan  (COZAAR ) 100 MG tablet [id number] Yes Take 1 tablet (100 mg total) by mouth daily. McDiarmid, Krystal BIRCH, MD  Active   metFORMIN  (GLUCOPHAGE -XR) 500 MG 24 hr tablet [id number] Yes Take 1 tablet (500 mg total) by mouth 2 (two) times daily with a meal. Larraine Palma, MD  Active   Multiple Vitamin (MULTIVITAMIN WITH MINERALS) TABS tablet [id number] Yes Take 1 tablet by mouth daily. Hongalgi, Anand D, MD  Active Self  Semaglutide ,0.25 or 0.5MG /DOS, (OZEMPIC , 0.25 OR 0.5 MG/DOSE,) 2 MG/3ML SOPN [id number] Yes Inject 0.5 mg into the skin once a week. McDiarmid, Krystal BIRCH, MD  Active              A/P: Diabetes longstanding with improved control over the last three months.  Currently A1C of 8.5. Medication adherence appears okay.  Insurance and medication access remain challenges to Improved control.  Additional 3 months of Libre 3 sensors provided to patient for Liberate  study.  - Increased basal insulin  Basaglar  (insulin  glargine)  20 units to 24 units units daily in the morning.  - Continued Ozempic  (semaglutide ) 0.5 mg once weekly.  - Started SGLT2-I Jardiance (empagliflozin) 25 mg (sample provided) once daily.  - Continued metformin  500 mg BID.  - Patient educated on purpose, proper use, and potential adverse effects.  - Extensively discussed pathophysiology of diabetes, recommended lifestyle interventions, dietary effects on glucose control.  - Counseled on s/sx of and management of hypoglycemia.  Provided samples of Lantus  (insulin  glargine) . Medication Samples have been provided  to the patient. Drug name: Lantus  (insulin  glargine)           Qty: 2 pens  LOT: [id number]  Exp.Date: 07/10/2025  he patient has been instructed regarding the correct time, dose, and frequency of taking this medication, including desired effects and most common side effects.   Maude Lagos 11:17 AM 04/04/2024    ASCVD risk - Primary prevention in patient with diabetes. Last LDL 116 mg/dl is not at goal of <29 mg/dL. Desc; low/moderate/high:110033} intensity statin indicated.  - Continued atorvastatin  40 mg daily.   Hypertension longstanding and currently greatly improved control.  Blood pressure goal of <130/80 mmHg. Medication adherence appears good.  Lower extremity swelling remains. Increase diuretic therapy and combine individual agents to decrease pill burden.   - Started olmesartan-hydrochlorothiazide -amlodipine  40-25-10 mg.   Written patient instructions provided. Patient verbalized understanding of treatment plan.  Total time in face to face counseling 48 minutes.    Follow-up:  Pharmacist visit 05/02/2024  PCP clinic visit TBD Patient seen with Sabra Schuller, PharmD Candidate - PY2 student and Megan McGill, PharmD Candidate - PY4 student.    "

## 2024-04-04 NOTE — Progress Notes (Signed)
 Patient has an appt on 1/29 @830  with Dr.Sam Cleotilde.

## 2024-04-04 NOTE — Progress Notes (Signed)
 Patient seen in the pharmacy clinic. Mentioned a poorly healing wound on the dorsum of his left foot. Was called to evaluate briefly. Per the patient, this has not changed for more than 6 months. He denies pain or fever. He has fluid oozing from the drainage. He usually follows up with the wound clinic, but does not have an upcoming appointment.  Exam: Gen: Well appearing. Ext: Stage 2 ulcer on the ball of his left foot. No tenderness, erythema, discharge, or swelling. No bone exposure.   A/P Chronic foot ulcer Hx of osteomyelitis s/p A/B treatment Low concern for osteomyelitis However, will order an X-ray to assess > ED referral if positive I advised him to call the wound clinic today to schedule a follow-up Referral also placed Wound dressed with Duoderm ED precautions discussed.  Plan to follow up with PCP this week or early next week to reassessment.

## 2024-04-04 NOTE — Assessment & Plan Note (Signed)
 Hypertension longstanding and currently greatly improved control.  Blood pressure goal of <130/80 mmHg. Medication adherence appears good.  Lower extremity swelling remains. Increase diuretic therapy and combine individual agents to decrease pill burden.   - Started olmesartan-hydrochlorothiazide -amlodipine  40-25-10 mg.

## 2024-04-04 NOTE — Patient Instructions (Addendum)
 It was nice to see you today!  Your goal glucose value is 80-130 before eating and less than 180 after eating.  Medication Changes: START Jardiance (empagliflozin) 25 mg once daily  Olmesartan-hydrochlorothiazide -amlodipine  40-25-10 mg once daily  Increase insulin  glargine 20 units daily to 24 units daily in the morning   Continue all other medication the same.  Keep up the good work with diet and exercise. Aim for a diet full of vegetables, fruit and lean meats (chicken, turkey, fish). Try to limit salt intake by eating fresh or frozen vegetables (instead of canned), rinse canned vegetables prior to cooking and do not add any additional salt to meals.  47  Please bring all medications to your clinic visits.  Please arrive 10-15 minutes prior to your scheduled visit time.

## 2024-04-10 ENCOUNTER — Ambulatory Visit: Payer: Self-pay | Admitting: Family Medicine

## 2024-04-10 NOTE — Progress Notes (Unsigned)
" ° ° °  SUBJECTIVE:   CHIEF COMPLAINT / HPI:   Follow up- anemia possibly? Doesn't have insurance, needs colon cancer screening  PERTINENT  PMH / PSH: ***  OBJECTIVE:   There were no vitals taken for this visit.  ***  ASSESSMENT/PLAN:   Assessment & Plan      Lucie Pinal, DO [facility] "

## 2024-04-14 ENCOUNTER — Other Ambulatory Visit: Payer: Self-pay

## 2024-04-15 ENCOUNTER — Other Ambulatory Visit: Payer: Self-pay

## 2024-05-02 ENCOUNTER — Ambulatory Visit: Payer: Self-pay | Admitting: Pharmacist
